# Patient Record
Sex: Female | Born: 1985 | Race: Black or African American | Hispanic: No | Marital: Single | Smoking: Current every day smoker
Health system: Southern US, Community
[De-identification: ages and names within clinical notes are randomized; demographics above are authoritative.]

## PROBLEM LIST (undated history)

## (undated) ENCOUNTER — Inpatient Hospital Stay (HOSPITAL_COMMUNITY): Payer: Self-pay

## (undated) DIAGNOSIS — F32A Depression, unspecified: Secondary | ICD-10-CM

## (undated) DIAGNOSIS — Z87728 Personal history of other specified (corrected) congenital malformations of nervous system and sense organs: Secondary | ICD-10-CM

## (undated) DIAGNOSIS — B999 Unspecified infectious disease: Secondary | ICD-10-CM

## (undated) DIAGNOSIS — F319 Bipolar disorder, unspecified: Secondary | ICD-10-CM

## (undated) DIAGNOSIS — G2401 Drug induced subacute dyskinesia: Secondary | ICD-10-CM

## (undated) DIAGNOSIS — F419 Anxiety disorder, unspecified: Secondary | ICD-10-CM

## (undated) DIAGNOSIS — F329 Major depressive disorder, single episode, unspecified: Secondary | ICD-10-CM

## (undated) DIAGNOSIS — F1490 Cocaine use, unspecified, uncomplicated: Secondary | ICD-10-CM

## (undated) DIAGNOSIS — F209 Schizophrenia, unspecified: Secondary | ICD-10-CM

## (undated) DIAGNOSIS — H6692 Otitis media, unspecified, left ear: Secondary | ICD-10-CM

## (undated) DIAGNOSIS — Z720 Tobacco use: Secondary | ICD-10-CM

## (undated) DIAGNOSIS — F1410 Cocaine abuse, uncomplicated: Secondary | ICD-10-CM

## (undated) DIAGNOSIS — G219 Secondary parkinsonism, unspecified: Secondary | ICD-10-CM

## (undated) DIAGNOSIS — Z21 Asymptomatic human immunodeficiency virus [HIV] infection status: Secondary | ICD-10-CM

## (undated) HISTORY — DX: Secondary parkinsonism, unspecified: G21.9

## (undated) HISTORY — DX: Cocaine abuse, uncomplicated: F14.10

## (undated) HISTORY — DX: Tobacco use: Z72.0

## (undated) HISTORY — PX: DENTAL RESTORATION/EXTRACTION WITH X-RAY: SHX5796

## (undated) HISTORY — DX: Personal history of other specified (corrected) congenital malformations of nervous system and sense organs: Z87.728

## (undated) HISTORY — DX: Drug induced subacute dyskinesia: G24.01

---
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
## 2004-10-21 ENCOUNTER — Emergency Department (HOSPITAL_COMMUNITY): Admission: EM | Admit: 2004-10-21 | Discharge: 2004-10-21 | Payer: Self-pay | Admitting: Emergency Medicine

## 2004-11-08 ENCOUNTER — Emergency Department (HOSPITAL_COMMUNITY): Admission: EM | Admit: 2004-11-08 | Discharge: 2004-11-08 | Payer: Self-pay | Admitting: Emergency Medicine

## 2004-12-21 ENCOUNTER — Encounter: Payer: Self-pay | Admitting: Obstetrics & Gynecology

## 2004-12-21 ENCOUNTER — Emergency Department (HOSPITAL_COMMUNITY): Admission: EM | Admit: 2004-12-21 | Discharge: 2004-12-22 | Payer: Self-pay | Admitting: Emergency Medicine

## 2004-12-22 ENCOUNTER — Encounter (INDEPENDENT_AMBULATORY_CARE_PROVIDER_SITE_OTHER): Payer: Self-pay | Admitting: *Deleted

## 2005-02-12 ENCOUNTER — Emergency Department (HOSPITAL_COMMUNITY): Admission: EM | Admit: 2005-02-12 | Discharge: 2005-02-13 | Payer: Self-pay | Admitting: Emergency Medicine

## 2007-04-04 ENCOUNTER — Emergency Department (HOSPITAL_COMMUNITY): Admission: EM | Admit: 2007-04-04 | Discharge: 2007-04-04 | Payer: Self-pay | Admitting: Emergency Medicine

## 2007-04-06 ENCOUNTER — Emergency Department (HOSPITAL_COMMUNITY): Admission: EM | Admit: 2007-04-06 | Discharge: 2007-04-06 | Payer: Self-pay | Admitting: Emergency Medicine

## 2007-08-25 HISTORY — PX: COLPOSCOPY: SHX161

## 2007-09-03 ENCOUNTER — Emergency Department (HOSPITAL_COMMUNITY): Admission: EM | Admit: 2007-09-03 | Discharge: 2007-09-03 | Payer: Self-pay | Admitting: Emergency Medicine

## 2007-10-23 ENCOUNTER — Emergency Department (HOSPITAL_COMMUNITY): Admission: EM | Admit: 2007-10-23 | Discharge: 2007-10-23 | Payer: Self-pay | Admitting: Emergency Medicine

## 2007-10-25 ENCOUNTER — Ambulatory Visit (HOSPITAL_COMMUNITY): Admission: RE | Admit: 2007-10-25 | Discharge: 2007-10-25 | Payer: Self-pay | Admitting: Family Medicine

## 2007-11-09 ENCOUNTER — Ambulatory Visit (HOSPITAL_COMMUNITY): Admission: RE | Admit: 2007-11-09 | Discharge: 2007-11-09 | Payer: Self-pay | Admitting: Family Medicine

## 2007-11-24 ENCOUNTER — Ambulatory Visit (HOSPITAL_COMMUNITY): Admission: RE | Admit: 2007-11-24 | Discharge: 2007-11-24 | Payer: Self-pay | Admitting: Family Medicine

## 2007-12-08 ENCOUNTER — Ambulatory Visit (HOSPITAL_COMMUNITY): Admission: RE | Admit: 2007-12-08 | Discharge: 2007-12-08 | Payer: Self-pay | Admitting: Family Medicine

## 2007-12-09 ENCOUNTER — Ambulatory Visit: Payer: Self-pay | Admitting: Obstetrics & Gynecology

## 2008-01-05 ENCOUNTER — Ambulatory Visit (HOSPITAL_COMMUNITY): Admission: RE | Admit: 2008-01-05 | Discharge: 2008-01-05 | Payer: Self-pay | Admitting: Family Medicine

## 2008-01-08 ENCOUNTER — Inpatient Hospital Stay (HOSPITAL_COMMUNITY): Admission: AD | Admit: 2008-01-08 | Discharge: 2008-01-08 | Payer: Self-pay | Admitting: Obstetrics and Gynecology

## 2008-01-08 ENCOUNTER — Ambulatory Visit: Payer: Self-pay | Admitting: Obstetrics and Gynecology

## 2008-03-17 ENCOUNTER — Inpatient Hospital Stay (HOSPITAL_COMMUNITY): Admission: AD | Admit: 2008-03-17 | Discharge: 2008-03-17 | Payer: Self-pay | Admitting: Family Medicine

## 2008-03-17 ENCOUNTER — Ambulatory Visit: Payer: Self-pay | Admitting: Advanced Practice Midwife

## 2008-03-27 ENCOUNTER — Ambulatory Visit (HOSPITAL_COMMUNITY): Admission: RE | Admit: 2008-03-27 | Discharge: 2008-03-27 | Payer: Self-pay | Admitting: Obstetrics & Gynecology

## 2008-03-31 ENCOUNTER — Ambulatory Visit: Payer: Self-pay | Admitting: Obstetrics & Gynecology

## 2008-03-31 ENCOUNTER — Inpatient Hospital Stay (HOSPITAL_COMMUNITY): Admission: AD | Admit: 2008-03-31 | Discharge: 2008-03-31 | Payer: Self-pay | Admitting: Obstetrics & Gynecology

## 2008-04-03 ENCOUNTER — Ambulatory Visit (HOSPITAL_COMMUNITY): Admission: RE | Admit: 2008-04-03 | Discharge: 2008-04-03 | Payer: Self-pay | Admitting: Family Medicine

## 2008-04-09 ENCOUNTER — Inpatient Hospital Stay (HOSPITAL_COMMUNITY): Admission: AD | Admit: 2008-04-09 | Discharge: 2008-04-12 | Payer: Self-pay | Admitting: Obstetrics & Gynecology

## 2008-04-09 ENCOUNTER — Ambulatory Visit: Payer: Self-pay | Admitting: Obstetrics and Gynecology

## 2008-07-26 DIAGNOSIS — Z8742 Personal history of other diseases of the female genital tract: Secondary | ICD-10-CM

## 2008-09-26 ENCOUNTER — Ambulatory Visit: Payer: Self-pay | Admitting: Obstetrics and Gynecology

## 2008-10-11 ENCOUNTER — Other Ambulatory Visit: Admission: RE | Admit: 2008-10-11 | Discharge: 2008-10-11 | Payer: Self-pay | Admitting: Obstetrics and Gynecology

## 2008-10-11 ENCOUNTER — Ambulatory Visit: Payer: Self-pay | Admitting: Obstetrics & Gynecology

## 2008-10-17 ENCOUNTER — Ambulatory Visit: Payer: Self-pay | Admitting: Obstetrics and Gynecology

## 2009-09-24 ENCOUNTER — Emergency Department (HOSPITAL_COMMUNITY): Admission: EM | Admit: 2009-09-24 | Discharge: 2009-09-24 | Payer: Self-pay | Admitting: Emergency Medicine

## 2009-10-16 ENCOUNTER — Emergency Department (HOSPITAL_COMMUNITY): Admission: EM | Admit: 2009-10-16 | Discharge: 2009-10-16 | Payer: Self-pay | Admitting: Emergency Medicine

## 2009-12-02 ENCOUNTER — Emergency Department (HOSPITAL_COMMUNITY): Admission: EM | Admit: 2009-12-02 | Discharge: 2009-12-02 | Payer: Self-pay | Admitting: Emergency Medicine

## 2009-12-16 ENCOUNTER — Inpatient Hospital Stay (HOSPITAL_COMMUNITY): Admission: AD | Admit: 2009-12-16 | Discharge: 2009-12-16 | Payer: Self-pay | Admitting: Obstetrics & Gynecology

## 2010-01-11 IMAGING — US US OB NUCHAL TRANSLUCENCY 1ST GEST
1 series · 14 of 28 positions shown · non-contrast
Comparison: none

OBSTETRICAL ULTRASOUND:
 This ultrasound was performed in The [HOSPITAL], and the [facility] report will be stored to [REDACTED] PACS.

[Series 1: us ob nuchal translucency 1st gest · 14 of 28 slices shown]
[im 2/28]
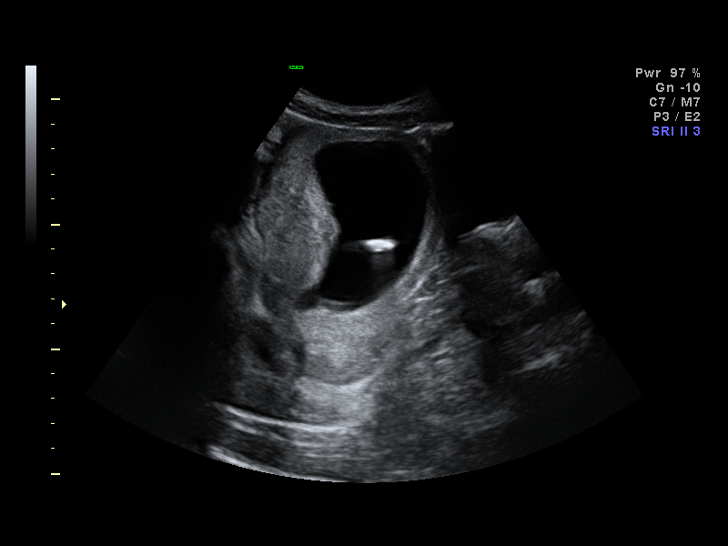
[im 4/28]
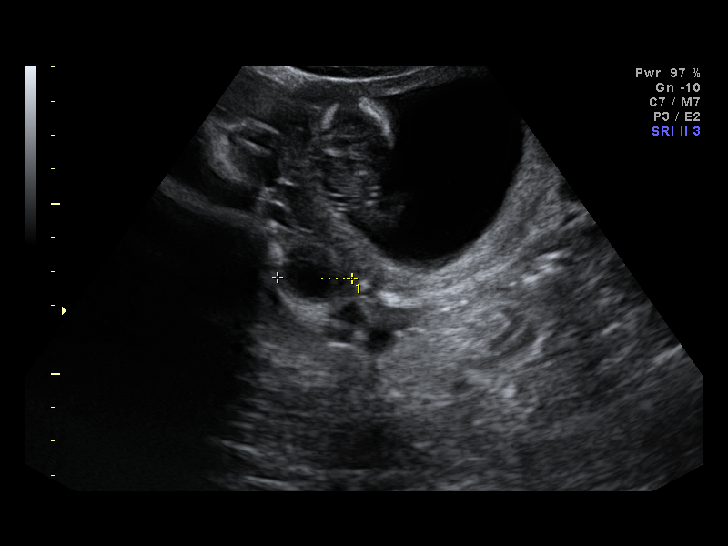
[im 6/28]
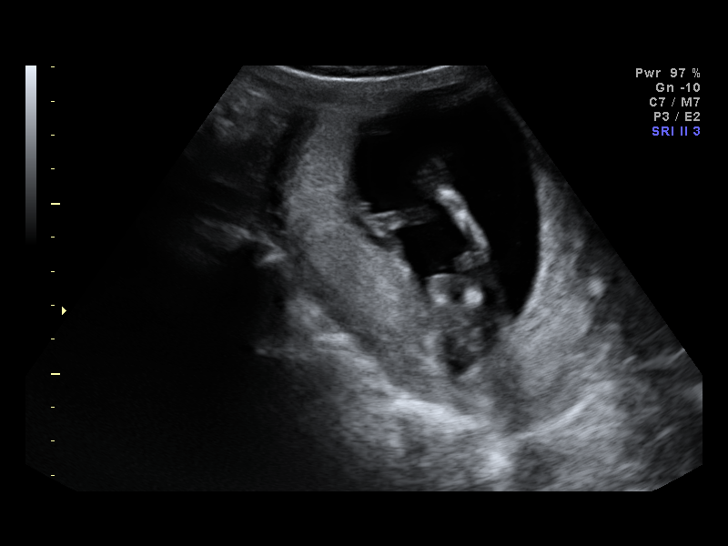
[im 8/28]
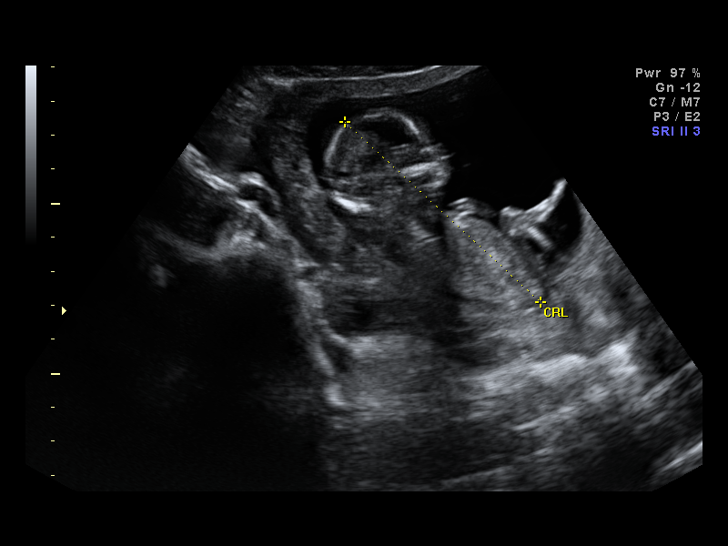
[im 10/28]
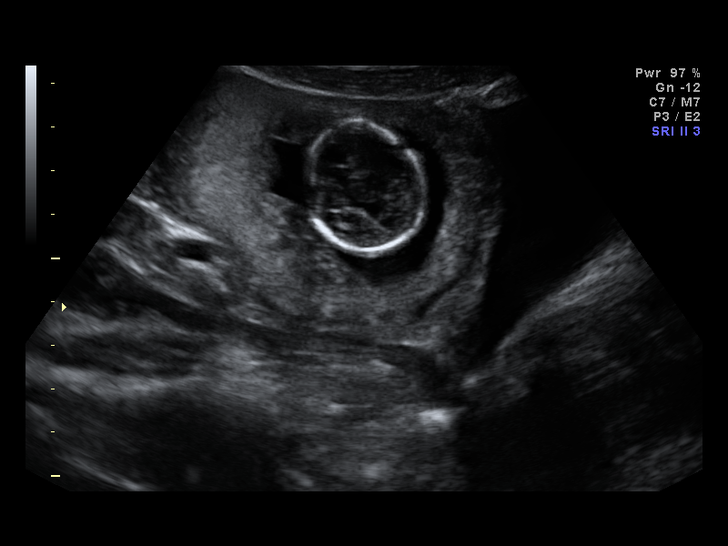
[im 12/28]
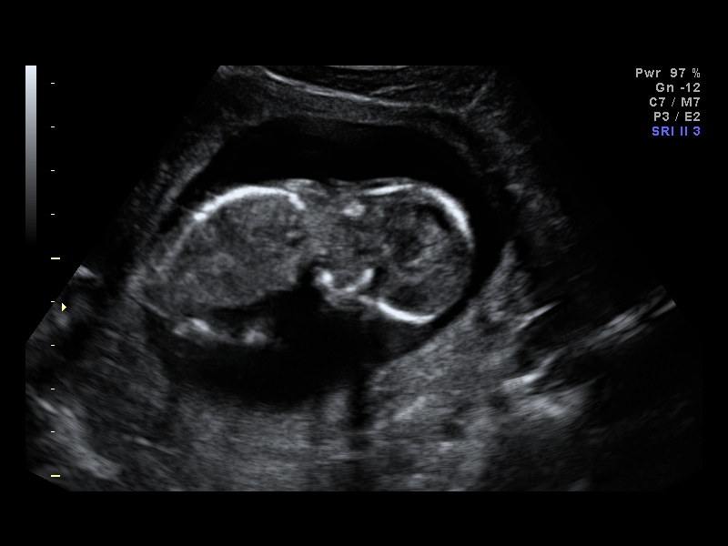
[im 14/28]
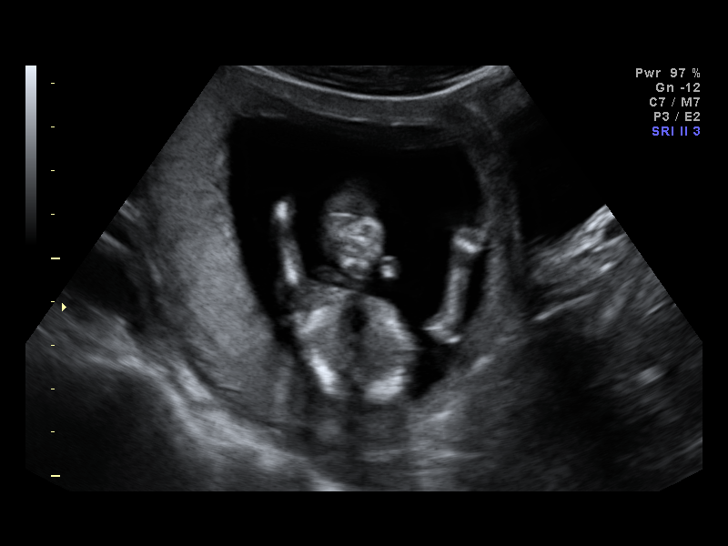
[im 16/28]
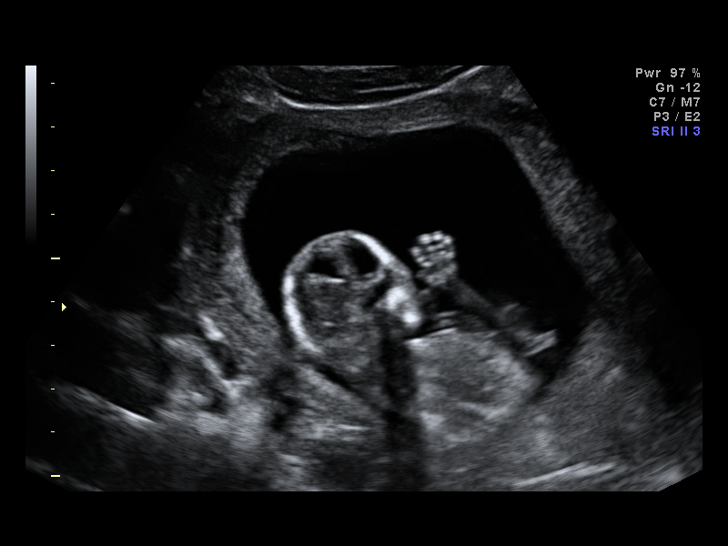
[im 18/28]
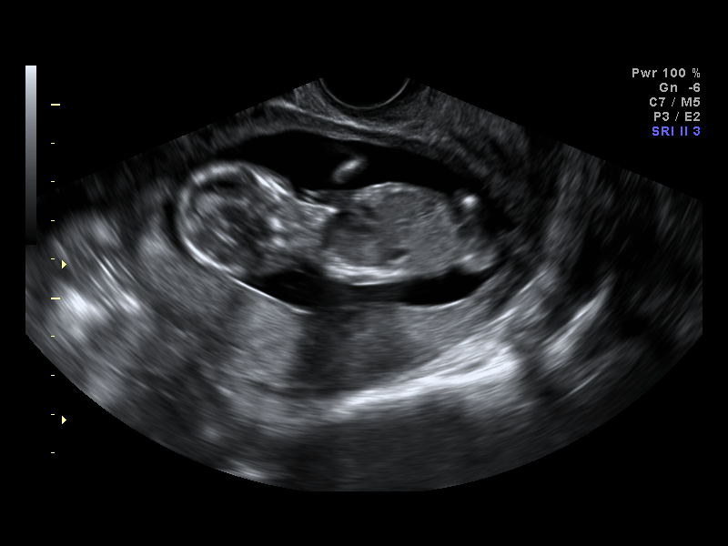
[im 20/28]
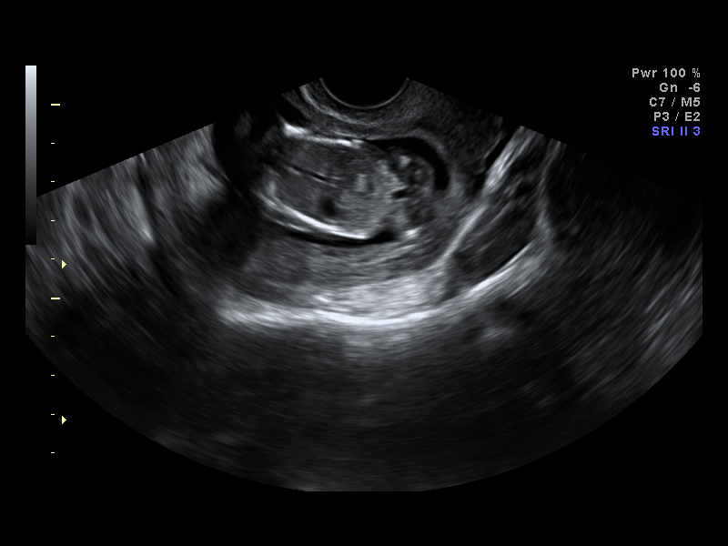
[im 22/28]
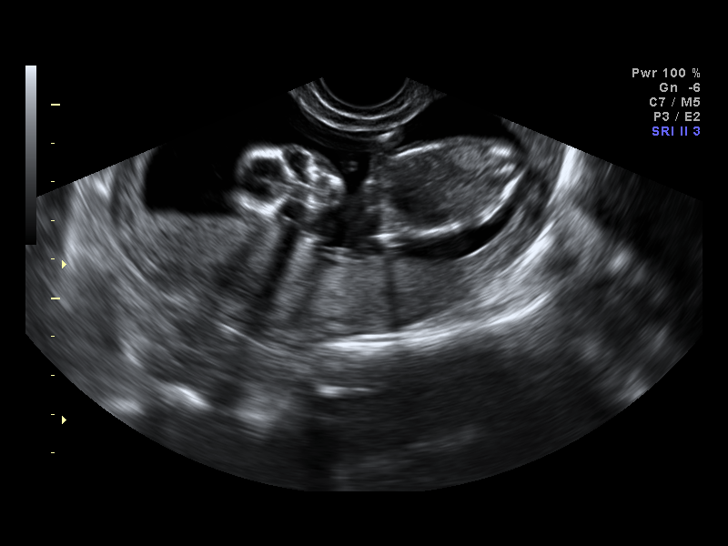
[im 24/28]
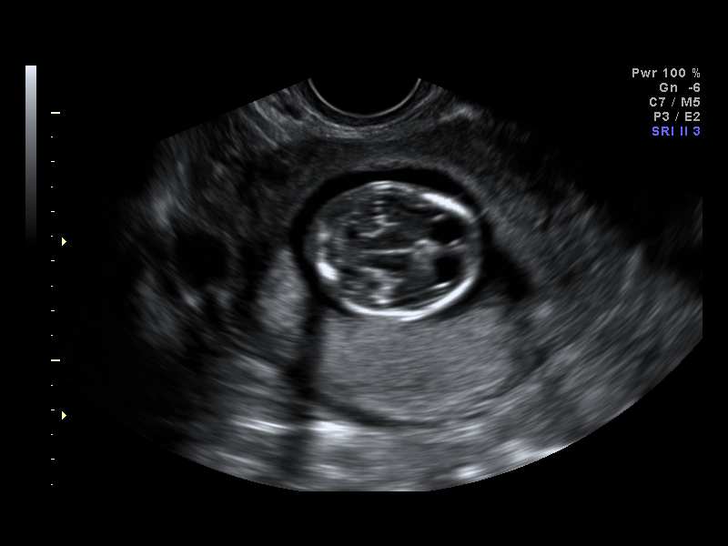
[im 26/28]
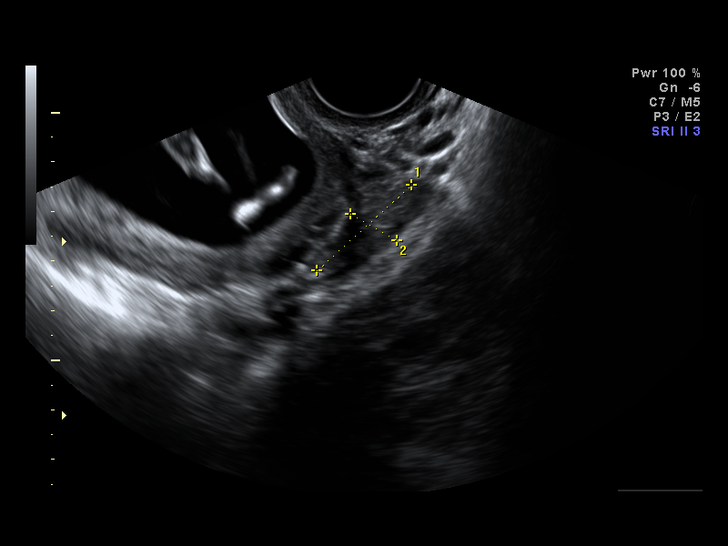
[im 28/28]
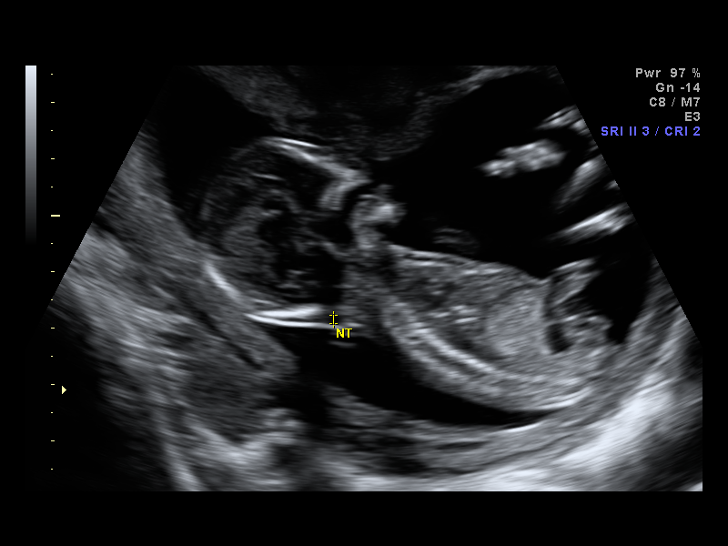

[14 of 28 positions shown; findings below may reference images not displayed]

IMPRESSION: The [facility] report has also been faxed to the ordering physician.

## 2010-08-31 ENCOUNTER — Emergency Department (HOSPITAL_COMMUNITY)
Admission: EM | Admit: 2010-08-31 | Discharge: 2010-08-31 | Payer: Self-pay | Source: Home / Self Care | Admitting: Emergency Medicine

## 2010-09-08 LAB — POCT PREGNANCY, URINE: Preg Test, Ur: POSITIVE

## 2010-11-11 LAB — RAPID URINE DRUG SCREEN, HOSP PERFORMED
Benzodiazepines: NOT DETECTED
Cocaine: NOT DETECTED
Opiates: NOT DETECTED

## 2010-11-11 LAB — CBC
HCT: 35.2 % — ABNORMAL LOW (ref 36.0–46.0)
MCV: 86.2 fL (ref 78.0–100.0)
Platelets: 115 10*3/uL — ABNORMAL LOW (ref 150–400)
RDW: 14.5 % (ref 11.5–15.5)
WBC: 9.4 10*3/uL (ref 4.0–10.5)

## 2010-11-11 LAB — HCG, QUANTITATIVE, PREGNANCY: hCG, Beta Chain, Quant, S: 1201 m[IU]/mL — ABNORMAL HIGH (ref <5)

## 2010-11-12 LAB — POCT PREGNANCY, URINE: Preg Test, Ur: POSITIVE

## 2010-11-12 LAB — URINALYSIS, ROUTINE W REFLEX MICROSCOPIC
Bilirubin Urine: NEGATIVE
Glucose, UA: NEGATIVE mg/dL
Hgb urine dipstick: NEGATIVE
Ketones, ur: 15 mg/dL — AB
Protein, ur: 30 mg/dL — AB
Urobilinogen, UA: 1 mg/dL (ref 0.0–1.0)

## 2010-11-12 LAB — URINE MICROSCOPIC-ADD ON

## 2010-12-09 LAB — POCT PREGNANCY, URINE: Preg Test, Ur: NEGATIVE

## 2011-01-06 NOTE — Group Therapy Note (Signed)
NAMELAURYL, SEYER                ACCOUNT NO.:  [id number]   MEDICAL RECORD NO.:  [id number]          PATIENT TYPE:  WOC   LOCATION:  [facility]                   FACILITY:  [facility]   PHYSICIAN:  Odie Sera, D.O.    DATE OF BIRTH:  [birth year]   DATE OF SERVICE:                                  CLINIC NOTE   INDICATION FOR SURGERY:  Ms. Easom is a 25 year old, gravida 2, para 1-0-  1-1, who previously had a colposcopy on July 12, 2008.  Biopsy  showing CIN 1 and 2, and CIN 3.  She presents today for LEEP.  She has  previously watched the LEEP video and we discussed the risks and  benefits of the procedure to include, but not limited to bleeding,  infection, and the need for a repeat LEEP colposcopy, or other  procedure.   DESCRIPTION OF PROCEDURE:  Under speculum exam, a colposcopy was  performed.  Her transformation zone was seeing 306 degrees, some  acetowhite epithelium was noted on the inferior aspect at approximately  4-7 o'clock locations.  The cervix was then anesthetized with  intracervical block, using 10 mL of 1% lidocaine without epinephrine.  A  biopsy was then performed using the Fischer loop and an adequate biopsy  was obtained and an electrocautery was used to cauterize the edges of  the biopsy area as well as some areas of oozing.  Some Monsel solution  was applied.  The patient tolerated the procedure well.  EBL is none.  The patient will return in 2 weeks for her results of the biopsy.           ______________________________  Odie Sera, D.O.     MC/MEDQ  D:  10/11/2008  T:  10/12/2008  Job:  [id number]

## 2011-01-06 NOTE — Group Therapy Note (Signed)
Tamara Briggs, MARCELLA NO.:  [id number]   MEDICAL RECORD NO.:  [id number]          PATIENT TYPE:  WOC   LOCATION:  [facility]                   FACILITY:  [facility]   PHYSICIAN:  Scheryl Darter, MD       DATE OF BIRTH:  August 04, 1986   DATE OF SERVICE:  09/26/2008                                  CLINIC NOTE   Patient is a 25 year old black female, gravida 2, para 1, abortus 1.  The last menstrual period is current with some spotting on Depo-Provera.  Patient had a Pap smear with LSIL on May 22, 2008 at [facility].  There were a few cells suggestive of higher-  grade lesions.  Colposcopy was done July 12, 2008, had biopsies  showing CIN-1 to 3.  She is here for evaluation for LEEP.  I explained  the nature of the abnormalities and the risk of progression to cervical  cancer.  Discussed cryotherapy and LEEP procedure and conization.  Offered scheduling LEEP.  Explained the procedure and the risk of  cervical scarring, cervical incompetence, bleeding, infection or  recurrent dysplasia.   PHYSICAL EXAMINATION:  Patient in no acute distress, is pleasant.  PELVIC EXAM:  External genitalia appeared normal.  Vagina and cervix  revealed  slight brownish spotting.  Cervix is parous with no gross  lesions.  Uterus is normal size, no adnexal masses or tenderness.   IMPRESSION:  Severe cervical dysplasia.   PLAN:  We will schedule a LEEP.      Scheryl Darter, MD     JA/MEDQ  D:  09/26/2008  T:  09/26/2008  Job:  [id number]

## 2011-05-13 LAB — DIFFERENTIAL
Basophils Absolute: 0
Basophils Relative: 0
Eosinophils Absolute: 0.1
Eosinophils Relative: 1
Lymphocytes Relative: 17
Lymphs Abs: 1.8
Monocytes Absolute: 0.8
Monocytes Relative: 7
Neutro Abs: 7.7
Neutrophils Relative %: 74

## 2011-05-13 LAB — HCG, QUANTITATIVE, PREGNANCY: hCG, Beta Chain, Quant, S: 41326 — ABNORMAL HIGH

## 2011-05-13 LAB — CBC
HCT: 38.7
Hemoglobin: 12.8
MCHC: 33.1
MCV: 86.2
Platelets: 216
RBC: 4.48
RDW: 13.5
WBC: 10.3

## 2011-05-13 LAB — RPR: RPR Ser Ql: NONREACTIVE

## 2011-05-13 LAB — URINALYSIS, ROUTINE W REFLEX MICROSCOPIC
Bilirubin Urine: NEGATIVE
Glucose, UA: NEGATIVE
Hgb urine dipstick: NEGATIVE
Ketones, ur: 15 — AB
Nitrite: NEGATIVE
Protein, ur: NEGATIVE
Specific Gravity, Urine: 1.03
Urobilinogen, UA: 1
pH: 6.5

## 2011-05-13 LAB — POCT PREGNANCY, URINE
Operator id: 257131
Preg Test, Ur: POSITIVE

## 2011-05-18 LAB — COMPREHENSIVE METABOLIC PANEL
AST: 23
Albumin: 3.1 — ABNORMAL LOW
Alkaline Phosphatase: 51
Chloride: 105
Creatinine, Ser: 0.54
GFR calc Af Amer: >60
Potassium: 3.8
Total Bilirubin: 0.5

## 2011-05-18 LAB — DIFFERENTIAL
Basophils Absolute: 0
Eosinophils Relative: 1
Lymphocytes Relative: 24
Monocytes Absolute: 0.5

## 2011-05-18 LAB — URINE CULTURE

## 2011-05-18 LAB — WET PREP, GENITAL
Clue Cells Wet Prep HPF POC: NONE SEEN
Trich, Wet Prep: NONE SEEN
WBC, Wet Prep HPF POC: NONE SEEN
Yeast Wet Prep HPF POC: NONE SEEN

## 2011-05-18 LAB — URINALYSIS, ROUTINE W REFLEX MICROSCOPIC
Bilirubin Urine: NEGATIVE
Glucose, UA: NEGATIVE
Ketones, ur: NEGATIVE
pH: 8

## 2011-05-18 LAB — URINE MICROSCOPIC-ADD ON

## 2011-05-18 LAB — CBC
Platelets: 168
WBC: 6.9

## 2011-05-19 LAB — POCT PREGNANCY, URINE: Preg Test, Ur: POSITIVE

## 2011-05-20 LAB — URINALYSIS, ROUTINE W REFLEX MICROSCOPIC
Bilirubin Urine: NEGATIVE
Glucose, UA: NEGATIVE
Hgb urine dipstick: NEGATIVE
Specific Gravity, Urine: <1.005 — ABNORMAL LOW
Urobilinogen, UA: 0.2
pH: 7

## 2011-05-22 LAB — URINALYSIS, ROUTINE W REFLEX MICROSCOPIC
Glucose, UA: 100 — AB
Leukocytes, UA: NEGATIVE
Protein, ur: NEGATIVE
Specific Gravity, Urine: <1.005 — ABNORMAL LOW
pH: 7

## 2011-05-22 LAB — URINE MICROSCOPIC-ADD ON

## 2011-06-08 LAB — WET PREP, GENITAL: Yeast Wet Prep HPF POC: NONE SEEN

## 2011-06-08 LAB — GC/CHLAMYDIA PROBE AMP, GENITAL: Chlamydia, DNA Probe: NEGATIVE

## 2011-06-08 LAB — URINALYSIS, ROUTINE W REFLEX MICROSCOPIC
Hgb urine dipstick: NEGATIVE
Nitrite: NEGATIVE
Protein, ur: NEGATIVE
Specific Gravity, Urine: 1.012
Urobilinogen, UA: 0.2

## 2011-06-08 LAB — POCT PREGNANCY, URINE: Preg Test, Ur: NEGATIVE

## 2011-06-29 ENCOUNTER — Encounter: Payer: Self-pay | Admitting: Internal Medicine

## 2011-07-04 ENCOUNTER — Emergency Department (HOSPITAL_COMMUNITY)
Admission: EM | Admit: 2011-07-04 | Discharge: 2011-07-05 | Disposition: A | Discharge status: Home / Self Care | Payer: Self-pay | Attending: Emergency Medicine | Admitting: Emergency Medicine

## 2011-07-04 ENCOUNTER — Encounter: Payer: Self-pay | Admitting: *Deleted

## 2011-07-04 ENCOUNTER — Emergency Department (HOSPITAL_COMMUNITY): Payer: Self-pay

## 2011-07-04 DIAGNOSIS — S93409A Sprain of unspecified ligament of unspecified ankle, initial encounter: Secondary | ICD-10-CM | POA: Insufficient documentation

## 2011-07-04 DIAGNOSIS — W108XXA Fall (on) (from) other stairs and steps, initial encounter: Secondary | ICD-10-CM | POA: Insufficient documentation

## 2011-07-04 DIAGNOSIS — F17200 Nicotine dependence, unspecified, uncomplicated: Secondary | ICD-10-CM | POA: Insufficient documentation

## 2011-07-04 DIAGNOSIS — M25579 Pain in unspecified ankle and joints of unspecified foot: Secondary | ICD-10-CM | POA: Insufficient documentation

## 2011-07-04 MED ORDER — HYDROCODONE-ACETAMINOPHEN 5-325 MG PO TABS
1.0000 | ORAL_TABLET | Freq: Once | ORAL | Status: DC
  Filled 2011-07-04: qty 1

## 2011-07-04 MED ORDER — IBUPROFEN 200 MG PO TABS
400.0000 mg | ORAL_TABLET | Freq: Once | ORAL | Status: AC
  Administered 2011-07-04: 400 mg via ORAL

## 2011-07-04 MED ORDER — HYDROCODONE-ACETAMINOPHEN 7.5-325 MG/15ML PO SOLN: 15.0000 mL | ORAL | Status: AC | PRN

## 2011-07-04 MED ORDER — IBUPROFEN 200 MG PO TABS
400.0000 mg | ORAL_TABLET | Freq: Once | ORAL | Status: DC
  Filled 2011-07-04: qty 1

## 2011-07-04 MED ORDER — HYDROCODONE-ACETAMINOPHEN 5-325 MG PO TABS
1.0000 | ORAL_TABLET | Freq: Once | ORAL | Status: AC
  Administered 2011-07-04: 1 via ORAL

## 2011-07-04 NOTE — ED Provider Notes (Signed)
History     CSN: [id number] Arrival date & time: 07/04/2011  6:19 PM   First MD Initiated Contact with Patient 07/04/11 2125      Chief Complaint  Patient presents with  . Ankle Pain    left ankle post fall    HPI: Patient is a 25 y.o. female presenting with fall. The history is provided by the patient. No language interpreter was used.  Fall The accident occurred 3 to 5 hours ago. Incident: tripped going down stairs. She fell from a height of 1 to 2 ft. She landed on concrete. Pain location: left ankle. The pain is at a severity of 8/10. The pain is severe. She was ambulatory at the scene. There was no entrapment after the fall. There was no drug use involved in the accident. There was no alcohol use involved in the accident. The symptoms are aggravated by ambulation. She has tried nothing for the symptoms.  Reports fell going down stairs today at approx 1700. Now w/ pain to (L) ankle.  History reviewed. No pertinent past medical history.  History reviewed. No pertinent past surgical history.  No family history on file.  History  Substance Use Topics  . Smoking status: Current Everyday Smoker  . Smokeless tobacco: Not on file  . Alcohol Use: No    OB History    Grav Para Term Preterm Abortions TAB SAB Ect Mult Living                  Review of Systems  Constitutional: Negative.   HENT: Negative.   Eyes: Negative.   Respiratory: Negative.   Gastrointestinal: Negative.   Genitourinary: Negative.   Musculoskeletal: Negative.   Skin: Negative.   Neurological: Negative.   Hematological: Negative.   Psychiatric/Behavioral: Negative.     Allergies  Review of patient's allergies indicates no known allergies.  Home Medications  No current outpatient prescriptions on file.  BP 97/63  Pulse 70  Temp(Src) 98 F (36.7 C) (Oral)  Resp 18  SpO2 100%  LMP 06/14/2011  Physical Exam  Constitutional: She is oriented to person, place, and time. She appears  well-developed and well-nourished.  HENT:  Head: Normocephalic and atraumatic.  Eyes: Conjunctivae are normal.  Neck: Normal range of motion.  Cardiovascular: Normal rate.   Pulmonary/Chest: Effort normal.  Musculoskeletal:       Right shoulder: Normal.       Left ankle: She exhibits no swelling, no ecchymosis, no deformity, no laceration and normal pulse. tenderness.  Neurological: She is alert and oriented to person, place, and time.  Skin: Skin is warm and dry.  Psychiatric: She has a normal mood and affect.    ED Course  Procedures No obvious signs of tx. Pt states minimal relief w/ percocet. Will plan for d/c home w/ short course of med for pain, ace wrap and post-op shoe and encourage f/u w/ ortho if not improving over next few days.   Labs Reviewed - No data to display Dg Ankle Complete Left  07/04/2011  *RADIOLOGY REPORT*  Clinical Data: Injured ankle.  LEFT ANKLE COMPLETE - 3+ VIEW  Comparison: 09/24/2009.  Findings: The ankle mortise is maintained.  No acute ankle fracture.  No osteochondral lesion.  The mid and hind foot bony structures are intact.  IMPRESSION: No acute bony findings.  Original Report Authenticated By: P. Loralie Champagne, M.D.     No diagnosis found.    MDM        DG Ankle Complete  Left (Final result)   Result time:07/04/11 2021    Final result by Rad Results In Interface (07/04/11 20:21:49)    Narrative:   *RADIOLOGY REPORT*  Clinical Data: Injured ankle.  LEFT ANKLE COMPLETE - 3+ VIEW  Comparison: 09/24/2009.  Findings: The ankle mortise is maintained. No acute ankle fracture. No osteochondral lesion. The mid and hind foot bony structures are intact.  IMPRESSION: No acute bony findings.  Original Report Authenticated By: P. Loralie Champagne, M.D          Leanne Chang, NP 07/04/11 2345  Roma Kayser Windsor Zirkelbach, NP 07/05/11 [phone]

## 2011-07-04 NOTE — ED Notes (Signed)
Ace wrap applied to left foot/ankle. Pt tolerates well.

## 2011-07-04 NOTE — ED Notes (Signed)
Pt fell off a porch about 4 steps and she missed step and now with left ankle pain

## 2011-07-05 NOTE — ED Provider Notes (Signed)
Medical screening examination/treatment/procedure(s) were performed by non-physician practitioner and as supervising physician I was immediately available for consultation/collaboration.   Vida Roller, MD 07/05/11 [phone]

## 2011-07-09 ENCOUNTER — Ambulatory Visit (INDEPENDENT_AMBULATORY_CARE_PROVIDER_SITE_OTHER): Payer: Self-pay

## 2011-07-09 DIAGNOSIS — Z111 Encounter for screening for respiratory tuberculosis: Secondary | ICD-10-CM

## 2011-07-09 DIAGNOSIS — Z7721 Contact with and (suspected) exposure to potentially hazardous body fluids: Secondary | ICD-10-CM

## 2011-07-09 DIAGNOSIS — Z23 Encounter for immunization: Secondary | ICD-10-CM

## 2011-07-09 DIAGNOSIS — B20 Human immunodeficiency virus [HIV] disease: Secondary | ICD-10-CM

## 2011-07-09 NOTE — Progress Notes (Signed)
BC enrolled this patient into the bridge counseling program on 06/29/11. BC will work with patient to engage her into medical care. BC will also provide treatment adherence if and when patient is put on medications. Patient admits to active crack cocaine use and has requested an inpatient tx facility to help her get clean. BC will assist with these goals.

## 2011-07-09 NOTE — Progress Notes (Signed)
Pt denied drug use but I was advised by Sharol Roussel she has a current history of crack use. She was very short with answers and somewhat shy and awkward at times.

## 2011-07-09 NOTE — Progress Notes (Deleted)
  Subjective:    Patient ID: Tamara Briggs, female    DOB: Jul 26, 1986, 25 y.o.   MRN: [id number]  HPI    Review of Systems     Objective:   Physical Exam        Assessment & Plan:

## 2011-07-10 LAB — CBC WITH DIFFERENTIAL/PLATELET
Basophils Absolute: 0 10*3/uL (ref 0.0–0.1)
Basophils Relative: 0 % (ref 0–1)
Hemoglobin: 11.9 g/dL — ABNORMAL LOW (ref 12.0–15.0)
Lymphocytes Relative: 26 % (ref 12–46)
MCHC: 30.8 g/dL (ref 30.0–36.0)
Neutro Abs: 1.5 10*3/uL — ABNORMAL LOW (ref 1.7–7.7)
Neutrophils Relative %: 55 % (ref 43–77)
RDW: 13.8 % (ref 11.5–15.5)
WBC: 2.7 10*3/uL — ABNORMAL LOW (ref 4.0–10.5)

## 2011-07-10 LAB — COMPLETE METABOLIC PANEL WITH GFR
ALT: 30 U/L (ref 0–35)
AST: 35 U/L (ref 0–37)
Albumin: 4.3 g/dL (ref 3.5–5.2)
Alkaline Phosphatase: 59 U/L (ref 39–117)
Potassium: 3.7 mEq/L (ref 3.5–5.3)
Sodium: 139 mEq/L (ref 135–145)
Total Protein: 8.3 g/dL (ref 6.0–8.3)

## 2011-07-10 LAB — LIPID PANEL
Cholesterol: 128 mg/dL (ref 0–200)
VLDL: 6 mg/dL (ref 0–40)

## 2011-07-10 LAB — RPR

## 2011-07-10 LAB — HEPATITIS C ANTIBODY: HCV Ab: NEGATIVE

## 2011-07-10 LAB — URINALYSIS
Bilirubin Urine: NEGATIVE
Protein, ur: NEGATIVE mg/dL
Urobilinogen, UA: 1 mg/dL (ref 0.0–1.0)

## 2011-07-10 LAB — HEPATITIS A ANTIBODY, TOTAL: Hep A Total Ab: NEGATIVE

## 2011-07-10 LAB — T-HELPER CELL (CD4) - (RCID CLINIC ONLY): CD4 T Cell Abs: 290 uL — ABNORMAL LOW (ref 400–2700)

## 2011-07-10 LAB — HEPATITIS B CORE ANTIBODY, TOTAL: Hep B Core Total Ab: NEGATIVE

## 2011-07-10 LAB — HIV ANTIBODY (ROUTINE TESTING W REFLEX): HIV: REACTIVE

## 2011-07-13 LAB — HIV-1 RNA ULTRAQUANT REFLEX TO GENTYP+
HIV 1 RNA Quant: 127000 copies/mL — ABNORMAL HIGH (ref <20)
HIV-1 RNA Quant, Log: 5.1 {Log} — ABNORMAL HIGH (ref <1.30)

## 2011-07-16 ENCOUNTER — Inpatient Hospital Stay (HOSPITAL_COMMUNITY)
Admission: AD | Admit: 2011-07-16 | Discharge: 2011-07-17 | Disposition: A | Discharge status: Home / Self Care | Payer: Self-pay | Source: Ambulatory Visit | Attending: Obstetrics and Gynecology | Admitting: Obstetrics and Gynecology

## 2011-07-16 ENCOUNTER — Encounter (HOSPITAL_COMMUNITY): Payer: Self-pay

## 2011-07-16 DIAGNOSIS — Z3202 Encounter for pregnancy test, result negative: Secondary | ICD-10-CM

## 2011-07-16 DIAGNOSIS — A7489 Other chlamydial diseases: Secondary | ICD-10-CM

## 2011-07-16 DIAGNOSIS — R112 Nausea with vomiting, unspecified: Secondary | ICD-10-CM | POA: Insufficient documentation

## 2011-07-16 DIAGNOSIS — A749 Chlamydial infection, unspecified: Secondary | ICD-10-CM

## 2011-07-16 LAB — URINALYSIS, ROUTINE W REFLEX MICROSCOPIC
Glucose, UA: NEGATIVE mg/dL
Leukocytes, UA: NEGATIVE
Protein, ur: 30 mg/dL — AB
Specific Gravity, Urine: 1.025 (ref 1.005–1.030)
Urobilinogen, UA: 0.2 mg/dL (ref 0.0–1.0)

## 2011-07-16 LAB — URINE MICROSCOPIC-ADD ON

## 2011-07-16 LAB — POCT PREGNANCY, URINE: Preg Test, Ur: NEGATIVE

## 2011-07-16 MED ORDER — AZITHROMYCIN 500 MG PO TABS: 1000.0000 mg | ORAL_TABLET | Freq: Once | ORAL | Status: AC

## 2011-07-16 MED ORDER — ONDANSETRON 8 MG PO TBDP
8.0000 mg | ORAL_TABLET | Freq: Once | ORAL | Status: AC
  Administered 2011-07-16: 8 mg via ORAL
  Filled 2011-07-16: qty 1

## 2011-07-16 NOTE — Progress Notes (Signed)
Pt states [facility] & was told her urine pregnancy test was positive. States came in today for nausea & vomiting since 2000 tonight with 2 episodes of vomiting. Denies pain. Denies vaginal bleeding. Reports brown watery vaginal discharge x2 days.

## 2011-07-16 NOTE — ED Provider Notes (Signed)
History     No chief complaint on file.  HPI Tamara Briggs 25 y.o. comes to [facility]U via EMS saying she is pregnant and is having nausea and vomiting.  EMS personnel report the police were on the scene when she was picked up from a family member's home.   OB History    Grav Para Term Preterm Abortions TAB SAB Ect Mult Living   2 1 1  0 1 0 1 0 0 1      Past Medical History  Diagnosis Date  . No pertinent past medical history     Past Surgical History  Procedure Date  . Colposcopy 2009    No family history on file.  History  Substance Use Topics  . Smoking status: Current Everyday Smoker -- 0.5 packs/day    Types: Cigarettes  . Smokeless tobacco: Not on file   Comment: smoker for 5 years   . Alcohol Use: No    Allergies: No Known Allergies  No prescriptions prior to admission    Review of Systems  Gastrointestinal: Positive for nausea and vomiting. Negative for abdominal pain.  Genitourinary:       Reports vaginal spotting   Physical Exam   Blood pressure 106/62, pulse 98, temperature 98.2 F (36.8 C), temperature source Oral, resp. rate 18, height 5\' 4"  (1.626 m), weight 94 lb (42.638 kg), last menstrual period 06/14/2011, SpO2 98.00%.  Physical Exam  Nursing note and vitals reviewed. Constitutional: She is oriented to person, place, and time. She appears well-developed and well-nourished.  HENT:  Head: Normocephalic.  Eyes: EOM are normal.  Neck: Neck supple.  Musculoskeletal: Normal range of motion.  Neurological: She is alert and oriented to person, place, and time.  Skin: Skin is warm and dry.  Psychiatric:       Minimal eye contact.  Is easily irritated.  Is angry and loud when answering questions.    MAU Course  Procedures Given Zofran 8 mg ODT sublingual for nausea  MDM Client does not offer any information.  Informed her pregnancy test is negative.  Reviewed labs done on 07-09-11.  When told her that testing on the 15th showed positive  chlamydia and positive HIV, she had no reaction. Unable to tell where she had been other than "across from [facility]".  Gave no information on the clinic, why she was there, or how she came to be there.  Denies getting any phone calls from the clinic.    States she has no way to get home.  House Coverage called.  Bus pass given.  Client was in a hurry to leave to get the bus.  Results for orders placed during the hospital encounter of 07/16/11 (from the past 24 hour(s))  URINALYSIS, ROUTINE W REFLEX MICROSCOPIC     Status: Abnormal   Collection Time   07/16/11  9:35 PM      Component Value Range   Color, Urine YELLOW  YELLOW    Appearance CLEAR  CLEAR    Specific Gravity, Urine 1.025  1.005 - 1.030    pH 6.0  5.0 - 8.0    Glucose, UA NEGATIVE  NEGATIVE (mg/dL)   Hgb urine dipstick NEGATIVE  NEGATIVE    Bilirubin Urine NEGATIVE  NEGATIVE    Ketones, ur NEGATIVE  NEGATIVE (mg/dL)   Protein, ur 30 (*) NEGATIVE (mg/dL)   Urobilinogen, UA 0.2  0.0 - 1.0 (mg/dL)   Nitrite NEGATIVE  NEGATIVE    Leukocytes, UA NEGATIVE  NEGATIVE  URINE MICROSCOPIC-ADD ON     Status: Abnormal   Collection Time   07/16/11  9:35 PM      Component Value Range   Squamous Epithelial / LPF FEW (*) RARE    WBC, UA 0-2  <3 (WBC/hpf)   RBC / HPF 0-2  <3 (RBC/hpf)   Bacteria, UA FEW (*) RARE   POCT PREGNANCY, URINE     Status: Normal   Collection Time   07/16/11  9:40 PM      Component Value Range   Preg Test, Ur NEGATIVE      Assessment and Plan  Nausea  Plan Zofran given in MAU Prescription for azithromycin given to treat chlamydia Advised to go to std clinic for treatment if unable to get prescription filled Advised to have partner go to STD clinic for treatment. Advised to keep appointment on Dec. 3 where she had been previously. Discussed with client that the spotting she is currently having is likely the beginning of her menses (LMP 06-14-11) or could be irreg bleeding associated with positive  chlamydia. Urine drug screen pending - previous notes on chart indicate she has used crack and was requesting admission to a treatment facility.  BURLESON,TERRI 07/16/2011, 10:34 PM   Nolene Bernheim, NP 07/16/11 2342  Nolene Bernheim, NP 07/16/11 1610  Nolene Bernheim, NP 07/16/11 2345

## 2011-07-17 LAB — RAPID URINE DRUG SCREEN, HOSP PERFORMED
Opiates: NOT DETECTED
Tetrahydrocannabinol: NOT DETECTED

## 2011-07-17 NOTE — ED Provider Notes (Signed)
Agree with above note.  Jamilee Lafosse 07/17/2011 6:47 AM

## 2011-07-27 ENCOUNTER — Other Ambulatory Visit: Payer: Self-pay | Admitting: *Deleted

## 2011-07-27 ENCOUNTER — Encounter: Payer: Self-pay | Admitting: Internal Medicine

## 2011-07-27 ENCOUNTER — Ambulatory Visit (INDEPENDENT_AMBULATORY_CARE_PROVIDER_SITE_OTHER): Payer: Self-pay | Admitting: Internal Medicine

## 2011-07-27 DIAGNOSIS — B20 Human immunodeficiency virus [HIV] disease: Secondary | ICD-10-CM

## 2011-07-27 DIAGNOSIS — R636 Underweight: Secondary | ICD-10-CM | POA: Insufficient documentation

## 2011-07-27 DIAGNOSIS — A749 Chlamydial infection, unspecified: Secondary | ICD-10-CM | POA: Insufficient documentation

## 2011-07-27 DIAGNOSIS — A7489 Other chlamydial diseases: Secondary | ICD-10-CM

## 2011-07-27 DIAGNOSIS — Z21 Asymptomatic human immunodeficiency virus [HIV] infection status: Secondary | ICD-10-CM

## 2011-07-27 MED ORDER — AZITHROMYCIN 500 MG PO TABS: 1000.0000 mg | ORAL_TABLET | Freq: Once | ORAL | Status: AC

## 2011-07-27 MED ORDER — EMTRICITAB-RILPIVIR-TENOFOV DF 200-25-300 MG PO TABS: 1.0000 | ORAL_TABLET | Freq: Every day | ORAL | Status: DC

## 2011-07-27 MED ORDER — ENSURE PO LIQD: 1.0000 | Freq: Two times a day (BID) | ORAL | Status: DC

## 2011-07-27 MED ORDER — AZITHROMYCIN 500 MG PO TABS: 1000.0000 mg | ORAL_TABLET | Freq: Once | ORAL | Status: DC

## 2011-07-28 LAB — HIV-1 GENOTYPR PLUS

## 2011-08-03 NOTE — Progress Notes (Signed)
INFECTIOUS DISEASES HIV INITIAL VISIT  Subjective:    Patient ID: Tamara Briggs, female    DOB: [birth year], 25 y.o.   MRN: [id number]  HPI Ms. Trawick is a 25yo Female HIV +, CD4 count of 290(33%)/VL 127,000,ART naive, initially diagnosed 2-3 yrs ago per patient. Risk factors include unprotected sex, illicit drug use, but denies injection. She reports having a main sex partner x 7 mo. In early November, 06/30/11, she was in the ED for vaginal spotting, and found to be pregnant in addition to having + chlamydia testing, UDS + cocaine. She subsequently had spontaneous abortion with follow up pregnancy test on 07/16/11 being negative. The patient does not recall being treated for chlamydia. She presents to ID clinic to re-establish care. She is interested in restarting Depo as her method of birth control.   The patient states that she has difficulty gaining weigh. On her prior pregnancy, she was up to 135 pounds in 2009. Currently her weight has been stable at 94 lbs. (she is 5 ft 4inch)  ALL: [facility]  MEDS: no meds currently  SH: unemployed, denies illicit drug use, but UDS from 11/6 were positive for cocaine. Had unprotected sex with main partner 2 months ago, but denies recent unprotected sex History  Substance Use Topics  . Smoking status: Current Everyday Smoker -- 0.5 packs/day    Types: Cigarettes  . Smokeless tobacco: Not on file   Comment: smoker for 5 years   . Alcohol Use: No    Review of Systems  Constitutional: Positive for fatigue. Negative for fever, chills and unexpected weight change.  HENT: Negative for congestion, rhinorrhea and sneezing.   Eyes: Negative.   Respiratory: Negative for cough and shortness of breath.   Cardiovascular: Negative for chest pain and palpitations.  Gastrointestinal: Negative for abdominal pain and diarrhea.  Genitourinary: Negative for dysuria, urgency, frequency, flank pain, vaginal bleeding, genital sores and pelvic pain.  Musculoskeletal: Negative  for myalgias, joint swelling and arthralgias.  Skin: Negative.   Neurological: Negative.   Hematological: Negative.  Negative for adenopathy.  Psychiatric/Behavioral: Negative.        Objective:   Physical Exam  Constitutional: She is oriented to person, place, and time. She appears well-developed. No distress.  HENT:  Head: Normocephalic and atraumatic.  Mouth/Throat: Oropharynx is clear and moist. No oropharyngeal exudate.  Eyes: Conjunctivae are normal. Pupils are equal, round, and reactive to light. Right eye exhibits no discharge. Left eye exhibits no discharge. No scleral icterus.  Neck: Normal range of motion. No JVD present. No thyromegaly present.  Cardiovascular: Normal rate, regular rhythm and normal heart sounds.  Exam reveals no gallop and no friction rub.   No murmur heard. Pulmonary/Chest: Breath sounds normal. No respiratory distress. She has no wheezes.  Abdominal: Soft. Bowel sounds are normal. She exhibits no distension. There is no tenderness. There is no rebound and no guarding.  Musculoskeletal: Normal range of motion. She exhibits no edema and no tenderness.  Lymphadenopathy:    She has no cervical adenopathy.  Neurological: She is alert and oriented to person, place, and time. She has normal reflexes. No cranial nerve deficit.  Skin: Skin is warm and dry. She is not diaphoretic. No erythema.  Psychiatric: She has a normal mood and affect.       Assessment & Plan:  HIV: patient is interested in starting therapy. We will arrange patient to start ADAP application with goal of starting complera daily  Chlamydia: will give patient azithromycin 1gm. GC  was negative. We will ask her to have her partner treated as well. At next visit, we will re-screen for GC/chlam to ensure she was not re-infected.  Birth Control: will have patient undergo PAP at our clinic and re-test for pregnancy, and give Depo inj.  Undernutrition: will set up with [facility], and also have rx for  ensure supplements  Smoking cessation = patient is pre-contemplative state, will continue to ask patient to decrease qty of smokes  RTC in 4 wks.

## 2011-10-27 ENCOUNTER — Telehealth: Payer: Self-pay | Admitting: *Deleted

## 2011-10-27 ENCOUNTER — Ambulatory Visit: Payer: Self-pay | Admitting: Internal Medicine

## 2011-10-27 NOTE — Telephone Encounter (Signed)
Message left at [phone] and [phone] to call [facility] to reschedule MD appt and make lab appt.

## 2011-11-03 ENCOUNTER — Ambulatory Visit: Payer: Self-pay | Admitting: Internal Medicine

## 2011-11-27 ENCOUNTER — Encounter: Payer: Self-pay | Admitting: *Deleted

## 2011-11-27 ENCOUNTER — Telehealth: Payer: Self-pay | Admitting: *Deleted

## 2011-11-27 NOTE — Telephone Encounter (Signed)
Message left to call [facility] for MD, Lab and PAP appts.  2nd message.

## 2011-11-27 NOTE — Progress Notes (Signed)
Patient ID: Tamara Briggs, female   DOB: 1985/09/23, 26 y.o.   MRN: [id number] Has not responded to previously left phone message to call for MD, Lab and PAP smear appts.  Left message again today.  Referring to [facility] Counselor to locate and encourage making keeping appts.

## 2011-12-03 ENCOUNTER — Other Ambulatory Visit: Payer: Self-pay

## 2011-12-04 ENCOUNTER — Other Ambulatory Visit: Payer: Self-pay

## 2011-12-04 ENCOUNTER — Other Ambulatory Visit: Payer: Self-pay | Admitting: *Deleted

## 2011-12-04 DIAGNOSIS — B20 Human immunodeficiency virus [HIV] disease: Secondary | ICD-10-CM

## 2011-12-17 ENCOUNTER — Ambulatory Visit: Payer: Self-pay | Admitting: Internal Medicine

## 2012-01-27 ENCOUNTER — Encounter: Payer: Self-pay | Admitting: *Deleted

## 2012-01-27 NOTE — Progress Notes (Signed)
Patient ID: Tamara Briggs, female   DOB: 03/20/1986, 26 y.o.   MRN: [id number] Previous [facility] referral 11/2011.  Re-referral for low CD4, high VL and no follow-up from LEEP at [facility] in 2010.

## 2012-02-15 ENCOUNTER — Encounter: Payer: Self-pay | Admitting: *Deleted

## 2012-02-15 NOTE — Progress Notes (Signed)
BC has sent multiple letters to every address on file for patient. BC has left multiple messages for patient and made a home visit. Patient avoids follow up for care. BC is unable to locate patient to offer services.

## 2012-05-10 ENCOUNTER — Emergency Department (HOSPITAL_COMMUNITY)
Admission: EM | Admit: 2012-05-10 | Discharge: 2012-05-11 | Disposition: A | Discharge status: Home / Self Care | Payer: Medicaid Other | Attending: Emergency Medicine | Admitting: Emergency Medicine

## 2012-05-10 ENCOUNTER — Encounter (HOSPITAL_COMMUNITY): Payer: Self-pay

## 2012-05-10 DIAGNOSIS — F17200 Nicotine dependence, unspecified, uncomplicated: Secondary | ICD-10-CM | POA: Insufficient documentation

## 2012-05-10 DIAGNOSIS — F411 Generalized anxiety disorder: Secondary | ICD-10-CM | POA: Insufficient documentation

## 2012-05-10 DIAGNOSIS — F1420 Cocaine dependence, uncomplicated: Secondary | ICD-10-CM | POA: Insufficient documentation

## 2012-05-10 DIAGNOSIS — F1910 Other psychoactive substance abuse, uncomplicated: Secondary | ICD-10-CM

## 2012-05-10 HISTORY — DX: Anxiety disorder, unspecified: F41.9

## 2012-05-10 HISTORY — DX: Cocaine use, unspecified, uncomplicated: F14.90

## 2012-05-10 LAB — COMPREHENSIVE METABOLIC PANEL
ALT: 13 U/L (ref 0–35)
AST: 24 U/L (ref 0–37)
Albumin: 3.7 g/dL (ref 3.5–5.2)
Alkaline Phosphatase: 68 U/L (ref 39–117)
BUN: 16 mg/dL (ref 6–23)
Chloride: 104 mEq/L (ref 96–112)
Potassium: 3.4 mEq/L — ABNORMAL LOW (ref 3.5–5.1)
Sodium: 141 mEq/L (ref 135–145)
Total Bilirubin: 0.1 mg/dL — ABNORMAL LOW (ref 0.3–1.2)
Total Protein: 9 g/dL — ABNORMAL HIGH (ref 6.0–8.3)

## 2012-05-10 LAB — CBC WITH DIFFERENTIAL/PLATELET
Basophils Absolute: 0 10*3/uL (ref 0.0–0.1)
Eosinophils Relative: 9 % — ABNORMAL HIGH (ref 0–5)
Lymphocytes Relative: 41 % (ref 12–46)
Lymphs Abs: 0.7 10*3/uL (ref 0.7–4.0)
MCV: 83.9 fL (ref 78.0–100.0)
Monocytes Relative: 16 % — ABNORMAL HIGH (ref 3–12)
Neutrophils Relative %: 32 % — ABNORMAL LOW (ref 43–77)
Platelets: 126 10*3/uL — ABNORMAL LOW (ref 150–400)
RBC: 4.47 MIL/uL (ref 3.87–5.11)
RDW: 13.4 % (ref 11.5–15.5)
WBC: 1.7 10*3/uL — ABNORMAL LOW (ref 4.0–10.5)

## 2012-05-10 LAB — ETHANOL: Alcohol, Ethyl (B): <11 mg/dL (ref 0–11)

## 2012-05-10 LAB — POCT PREGNANCY, URINE: Preg Test, Ur: NEGATIVE

## 2012-05-10 LAB — URINALYSIS, ROUTINE W REFLEX MICROSCOPIC
Glucose, UA: NEGATIVE mg/dL
Leukocytes, UA: NEGATIVE
Protein, ur: 30 mg/dL — AB
Specific Gravity, Urine: 1.028 (ref 1.005–1.030)
Urobilinogen, UA: 1 mg/dL (ref 0.0–1.0)

## 2012-05-10 LAB — RAPID URINE DRUG SCREEN, HOSP PERFORMED
Amphetamines: NOT DETECTED
Benzodiazepines: NOT DETECTED
Tetrahydrocannabinol: NOT DETECTED

## 2012-05-10 LAB — URINE MICROSCOPIC-ADD ON

## 2012-05-10 NOTE — ED Provider Notes (Signed)
History     CSN: [id number]  Arrival date & time 05/10/12  1946   First MD Initiated Contact with Patient 05/10/12 2142      Chief Complaint  Patient presents with  . Drug Problem    (Consider location/radiation/quality/duration/timing/severity/associated sxs/prior treatment) HPI History provided by pt.   Pt presents w/ request for cocaine detox.  Has been using for 3 years and has attempted detox on her own multiple times w/out success.  Has never been enrolled in a program.  Does not abuse any other substances.  Denies SI/HI.  Currently feels well.  Denies CP, SOB, abd pain, N/V/D.    Past Medical History  Diagnosis Date  . No pertinent past medical history   . Crack cocaine use   . Anxiety     Past Surgical History  Procedure Date  . Colposcopy 2009    History reviewed. No pertinent family history.  History  Substance Use Topics  . Smoking status: Current Every Day Smoker -- 0.5 packs/day    Types: Cigarettes  . Smokeless tobacco: Not on file   Comment: smoker for 5 years   . Alcohol Use: No    OB History    Grav Para Term Preterm Abortions TAB SAB Ect Mult Living   2 1 1  0 1 0 1 0 0 1      Review of Systems  All other systems reviewed and are negative.    Allergies  Review of patient's allergies indicates no known allergies.  Home Medications   Current Outpatient Rx  Name Route Sig Dispense Refill  . ACETAMINOPHEN-CODEINE #3 300-30 MG PO TABS Oral Take 1 tablet by mouth every 6 (six) hours as needed. For pain      BP 103/63  Temp 98.1 F (36.7 C) (Oral)  Resp 18  SpO2 100%  LMP 05/10/2012  Physical Exam  Nursing note and vitals reviewed. Constitutional: She is oriented to person, place, and time. She appears well-developed and well-nourished. No distress.  HENT:  Head: Normocephalic and atraumatic.  Eyes:       Normal appearance  Neck: Normal range of motion.  Cardiovascular: Normal rate and regular rhythm.   Pulmonary/Chest: Effort  normal and breath sounds normal. No respiratory distress.  Musculoskeletal: Normal range of motion.  Neurological: She is alert and oriented to person, place, and time.  Skin: Skin is warm and dry. No rash noted.  Psychiatric: She has a normal mood and affect. Her behavior is normal.    ED Course  Procedures (including critical care time)  Labs Reviewed  URINALYSIS, ROUTINE W REFLEX MICROSCOPIC - Abnormal; Notable for the following:    Hgb urine dipstick MODERATE (*)     Bilirubin Urine SMALL (*)     Ketones, ur 15 (*)     Protein, ur 30 (*)     All other components within normal limits  CBC WITH DIFFERENTIAL - Abnormal; Notable for the following:    WBC 1.7 (*)     Platelets 126 (*)     Neutrophils Relative 32 (*)     Monocytes Relative 16 (*)     Eosinophils Relative 9 (*)     Basophils Relative 2 (*)     Neutro Abs 0.5 (*)     All other components within normal limits  COMPREHENSIVE METABOLIC PANEL - Abnormal; Notable for the following:    Potassium 3.4 (*)     Total Protein 9.0 (*)     Total Bilirubin 0.1 (*)  All other components within normal limits  URINE RAPID DRUG SCREEN (HOSP PERFORMED) - Abnormal; Notable for the following:    Cocaine POSITIVE (*)     All other components within normal limits  URINE MICROSCOPIC-ADD ON - Abnormal; Notable for the following:    Squamous Epithelial / LPF MANY (*)     Bacteria, UA FEW (*)     Crystals CA OXALATE CRYSTALS (*)     All other components within normal limits  ETHANOL  POCT PREGNANCY, URINE   No results found.   1. Substance abuse       MDM  26yo F presents w/ request for detox from cocaine.  Cleared medically.  ACT team consulted.  Pt moved to Pod C for holding and orders written.           Otilio Miu, [place] 05/11/12 [phone]

## 2012-05-10 NOTE — ED Notes (Signed)
Pts belongings documented on inventory sheet, placed in two belongings bag and blck backpack pack and secured.

## 2012-05-10 NOTE — ED Notes (Signed)
Pt requesting detox from crack/cocaine, last used 3 hrs ago, reports using crack/cocaine x2 years, pt sts "I do as much as I can get every day>" pt unsure how much she uses daily. Pt denies SI/HI

## 2012-05-11 NOTE — Progress Notes (Signed)
Writer contacted [facility] regarding possible inpatient treatment and was advised that 14 day beds were available. Writer will need to complete screening with Shayla who advised that she would call back after morning meeting.  Awaiting return call to complete referral process.

## 2012-05-11 NOTE — ED Notes (Signed)
Pt given community resources by Irving Burton, unable to go to [facility] due to court date next week

## 2012-05-11 NOTE — BH Assessment (Addendum)
Assessment Note    Patient is a 26 year old [place] American female that requests detox from cocaine Patient has been using for 3 years and has attempted detox on her own multiple times without success.  Patient UDS was positive for cocaine.  Patient BAL was <11.  Patient denies any previous inpatient or outpatient treatment. Patient denies any prior counseling or psychiatric hospitalization. Patient denies any SI/HI.  Patient is unsure how much she uses daily.  Patient last used on 05-10-2012 at 5:00 p.m.    Patient denies any withdrawal symptoms.  Patient reports that she has been hearing someone try to talk to her for the past year.  Patient reports severe mood swings.  Patient reports feelings of depression and hopelessness due to her inability to stop using.    Axis I: Substance Abuse Mood Disorder  Axis II: Deferred Axis III:  Past Medical History  Diagnosis Date  . No pertinent past medical history   . Crack cocaine use   . Anxiety    Axis IV: economic problems, occupational problems, other psychosocial or environmental problems, problems related to social environment, problems with access to health care services and problems with primary support group Axis V: 31-40 impairment in reality testing  Past Medical History:  Past Medical History  Diagnosis Date  . No pertinent past medical history   . Crack cocaine use   . Anxiety     Past Surgical History  Procedure Date  . Colposcopy 2009    Family History: History reviewed. No pertinent family history.  Social History:  reports that she has been smoking Cigarettes.  She has been smoking about .5 packs per day. She does not have any smokeless tobacco history on file. She reports that she uses illicit drugs (Cocaine). She reports that she does not drink alcohol.  Additional Social History:     CIWA: CIWA-Ar BP: 89/47 mmHg COWS: Clinical Opiate Withdrawal Scale (COWS) Resting Pulse Rate: Pulse Rate 80 or below Sweating: No  report of chills or flushing Restlessness: Able to sit still Pupil Size: Pupils pinned or normal size for room light Bone or Joint Aches: Not present Runny Nose or Tearing: Not present GI Upset: No GI symptoms Tremor: No tremor Yawning: No yawning Anxiety or Irritability: Patient reports increasing irritability or anxiousness Gooseflesh Skin: Skin is smooth COWS Total Score: 1   Allergies: No Known Allergies  Home Medications:  (Not in a hospital admission)  OB/GYN Status:  Patient's last menstrual period was 05/10/2012.  General Assessment Data Location of Assessment: [facility] ED ACT Assessment: Yes Living Arrangements: Alone Can pt return to current living arrangement?: Yes Admission Status: Voluntary Is patient capable of signing voluntary admission?: Yes Transfer from: Acute Hospital Referral Source: Self/Family/Friend  Education Status Is patient currently in school?: No  Risk to self Suicidal Ideation: No Suicidal Intent: No Is patient at risk for suicide?: No Suicidal Plan?: No Access to Means: No What has been your use of drugs/alcohol within the last 12 months?: Cocaine Previous Attempts/Gestures: No How many times?: 0  Other Self Harm Risks: 0 Triggers for Past Attempts: None known Intentional Self Injurious Behavior: None Family Suicide History: No Recent stressful life event(s): Conflict (Comment);Financial Problems;Other (Comment) Persecutory voices/beliefs?: Yes Depression: Yes Depression Symptoms: Isolating;Fatigue;Guilt;Loss of interest in usual pleasures;Feeling worthless/self pity Substance abuse history and/or treatment for substance abuse?: Yes Suicide prevention information given to non-admitted patients: Yes  Risk to Others Homicidal Ideation: No Thoughts of Harm to Others: No Current Homicidal Intent:  No Current Homicidal Plan: No Access to Homicidal Means: No Identified Victim: None  History of harm to others?: No Assessment of Violence:  None Noted Violent Behavior Description: None Reported Does patient have access to weapons?: No Criminal Charges Pending?: No Does patient have a court date: No  Psychosis Hallucinations: Auditory Delusions: None noted  Mental Status Report Appear/Hygiene: Body odor;Disheveled;Poor hygiene Eye Contact: Fair Motor Activity: Freedom of movement Speech: Logical/coherent;Language other than English Level of Consciousness: Alert Mood: Depressed Affect: Depressed Anxiety Level: None Thought Processes: Coherent Judgement: Unimpaired Orientation: Person;Place;Time;Situation Obsessive Compulsive Thoughts/Behaviors: None  Cognitive Functioning Concentration: Decreased Memory: Recent Intact;Remote Intact IQ: Above Average Insight: Fair Impulse Control: Poor Appetite: Poor Weight Loss: 0  Weight Gain: 0  Sleep: Decreased Total Hours of Sleep: 5  Vegetative Symptoms: Not bathing  ADLScreening [facility] Assessment Services) Patient's cognitive ability adequate to safely complete daily activities?: Yes Patient able to express need for assistance with ADLs?: Yes Independently performs ADLs?: Yes (appropriate for developmental age)  Abuse/Neglect [facility]) Physical Abuse: Denies Verbal Abuse: Denies Sexual Abuse: Denies  Prior Inpatient Therapy Prior Inpatient Therapy: No Prior Therapy Dates: N/A Prior Therapy Facilty/Provider(s): N/A Reason for Treatment: None   Prior Outpatient Therapy Prior Outpatient Therapy: No Prior Therapy Dates: N/A Prior Therapy Facilty/Provider(s): N/A Reason for Treatment: None   ADL Screening (condition at time of admission) Patient's cognitive ability adequate to safely complete daily activities?: Yes Patient able to express need for assistance with ADLs?: Yes Independently performs ADLs?: Yes (appropriate for developmental age)       Abuse/Neglect Assessment (Assessment to be complete while patient is alone) Physical Abuse: Denies Verbal  Abuse: Denies Sexual Abuse: Denies Values / Beliefs Cultural Requests During Hospitalization: None Spiritual Requests During Hospitalization: None        Additional Information 1:1 In Past 12 Months?: No CIRT Risk: No Elopement Risk: No Does patient have medical clearance?: Yes     Disposition: Pending [facility]. Disposition Disposition of Patient: Referred to Patient referred to: Other (Comment)  On Site Evaluation by:   Reviewed with Physician:     Phillip Heal LaVerne 05/11/2012 4:49 AM
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
## 2012-05-11 NOTE — BHH Counselor (Signed)
Phillip Heal, ACT counselor, submitted Pt for admission to [facility]. Akeysha McMurren, AC confirmed bed availability. Gave clinical report to Verne Spurr, PA who declined Pt due to no inpatient criteria and recommended referrals to outpatient substance abuse treatment.  Harlin Rain Patsy Baltimore, LPC

## 2012-05-11 NOTE — ED Provider Notes (Signed)
Patient requesting to be discharged.  She apparently has a court date and cannot be admitted.  She is here voluntarily and is capable of making this decision.  She is welcome to return if she changes her mind.  Geoffery Lyons, MD 05/11/12 1002

## 2012-05-12 LAB — PATHOLOGIST SMEAR REVIEW

## 2012-05-12 NOTE — ED Provider Notes (Signed)
Medical screening examination/treatment/procedure(s) were performed by non-physician practitioner and as supervising physician I was immediately available for consultation/collaboration.   David H Yao, MD 05/12/12 0843 

## 2012-05-19 ENCOUNTER — Other Ambulatory Visit: Payer: Medicaid Other

## 2012-05-23 ENCOUNTER — Other Ambulatory Visit: Payer: Medicaid Other

## 2012-06-09 ENCOUNTER — Ambulatory Visit: Payer: Medicaid Other | Admitting: Internal Medicine

## 2012-07-19 ENCOUNTER — Emergency Department (HOSPITAL_COMMUNITY)
Admission: EM | Admit: 2012-07-19 | Discharge: 2012-07-21 | Disposition: A | Discharge status: Home / Self Care | Payer: Medicaid Other | Attending: Emergency Medicine | Admitting: Emergency Medicine

## 2012-07-19 ENCOUNTER — Encounter (HOSPITAL_COMMUNITY): Payer: Self-pay | Admitting: Emergency Medicine

## 2012-07-19 DIAGNOSIS — Z5900 Homelessness unspecified: Secondary | ICD-10-CM | POA: Insufficient documentation

## 2012-07-19 DIAGNOSIS — F411 Generalized anxiety disorder: Secondary | ICD-10-CM | POA: Insufficient documentation

## 2012-07-19 DIAGNOSIS — Z3202 Encounter for pregnancy test, result negative: Secondary | ICD-10-CM | POA: Insufficient documentation

## 2012-07-19 DIAGNOSIS — F1410 Cocaine abuse, uncomplicated: Secondary | ICD-10-CM

## 2012-07-19 DIAGNOSIS — F329 Major depressive disorder, single episode, unspecified: Secondary | ICD-10-CM | POA: Insufficient documentation

## 2012-07-19 DIAGNOSIS — F3289 Other specified depressive episodes: Secondary | ICD-10-CM | POA: Insufficient documentation

## 2012-07-19 DIAGNOSIS — F17200 Nicotine dependence, unspecified, uncomplicated: Secondary | ICD-10-CM | POA: Insufficient documentation

## 2012-07-19 DIAGNOSIS — E876 Hypokalemia: Secondary | ICD-10-CM | POA: Insufficient documentation

## 2012-07-19 DIAGNOSIS — B20 Human immunodeficiency virus [HIV] disease: Secondary | ICD-10-CM | POA: Insufficient documentation

## 2012-07-19 HISTORY — DX: Depression, unspecified: F32.A

## 2012-07-19 HISTORY — DX: Major depressive disorder, single episode, unspecified: F32.9

## 2012-07-19 LAB — URINALYSIS, ROUTINE W REFLEX MICROSCOPIC
Hgb urine dipstick: NEGATIVE
Nitrite: NEGATIVE
Protein, ur: 30 mg/dL — AB
Urobilinogen, UA: 2 mg/dL — ABNORMAL HIGH (ref 0.0–1.0)

## 2012-07-19 LAB — COMPREHENSIVE METABOLIC PANEL
ALT: 11 U/L (ref 0–35)
AST: 22 U/L (ref 0–37)
Albumin: 2.9 g/dL — ABNORMAL LOW (ref 3.5–5.2)
Calcium: 8.5 mg/dL (ref 8.4–10.5)
Chloride: 103 mEq/L (ref 96–112)
Creatinine, Ser: 0.69 mg/dL (ref 0.50–1.10)
Sodium: 139 mEq/L (ref 135–145)
Total Bilirubin: 0.2 mg/dL — ABNORMAL LOW (ref 0.3–1.2)

## 2012-07-19 LAB — CBC WITH DIFFERENTIAL/PLATELET
Basophils Absolute: 0 10*3/uL (ref 0.0–0.1)
Basophils Relative: 0 % (ref 0–1)
Eosinophils Absolute: 0 10*3/uL (ref 0.0–0.7)
Eosinophils Relative: 0 % (ref 0–5)
Hemoglobin: 9.9 g/dL — ABNORMAL LOW (ref 12.0–15.0)
MCH: 27.9 pg (ref 26.0–34.0)
MCHC: 32.6 g/dL (ref 30.0–36.0)
MCV: 85.6 fL (ref 78.0–100.0)
Metamyelocytes Relative: 0 %
Myelocytes: 0 %
RBC: 3.55 MIL/uL — ABNORMAL LOW (ref 3.87–5.11)

## 2012-07-19 LAB — ETHANOL: Alcohol, Ethyl (B): <11 mg/dL (ref 0–11)

## 2012-07-19 LAB — RAPID URINE DRUG SCREEN, HOSP PERFORMED
Barbiturates: NOT DETECTED
Benzodiazepines: NOT DETECTED

## 2012-07-19 LAB — URINE MICROSCOPIC-ADD ON

## 2012-07-19 LAB — POCT PREGNANCY, URINE: Preg Test, Ur: NEGATIVE

## 2012-07-19 MED ORDER — POTASSIUM CHLORIDE 20 MEQ/15ML (10%) PO LIQD
40.0000 meq | Freq: Once | ORAL | Status: AC
  Administered 2012-07-19: 40 meq via ORAL
  Filled 2012-07-19: qty 30
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
## 2012-07-19 NOTE — ED Provider Notes (Addendum)
History     CSN: [id number]  Arrival date & time 07/19/12  1928   First MD Initiated Contact with Patient 07/19/12 1944      Chief Complaint  Patient presents with  . Drug Problem    (Consider location/radiation/quality/duration/timing/severity/associated sxs/prior treatment) HPI  Marciana L Licano is a 26 y.o. female presenting for detox from crack cocaine. Patient has used crack for 2 years has been a daily user for the last year. Last use this a.m. She reports occasional alcohol use with no history of abuse or withdrawal. Patient denies suicidal ideation or homicidal ideation. She's recently been diagnosed with HIV has not started any medications because she was waiting for her Medicaid to become active. Patient denies any chest pain, palpitations, headache, abdominal pain, vomiting. Endorses nausea.  Past Medical History  Diagnosis Date  . Crack cocaine use   . Anxiety   . Depression     Past Surgical History  Procedure Date  . Colposcopy 2009    No family history on file.  History  Substance Use Topics  . Smoking status: Current Every Day Smoker -- 0.5 packs/day    Types: Cigarettes  . Smokeless tobacco: Not on file     Comment: smoker for 5 years   . Alcohol Use: No    OB History    Grav Para Term Preterm Abortions TAB SAB Ect Mult Living   2 1 1  0 1 0 1 0 0 1      Review of Systems  Allergies  Review of patient's allergies indicates no known allergies.  Home Medications  No current outpatient prescriptions on file.  BP 127/83  Pulse 94  Temp 98.2 F (36.8 C) (Oral)  Resp 16  SpO2 100%  LMP 06/30/2012  Physical Exam  Nursing note and vitals reviewed. Constitutional: She is oriented to person, place, and time. She appears well-developed and well-nourished.       Thin and frail  HENT:  Head: Normocephalic.  Eyes: Conjunctivae normal and EOM are normal. Pupils are equal, round, and reactive to light.  Neck: Normal range of motion.    Cardiovascular: Normal rate, regular rhythm and intact distal pulses.   Pulmonary/Chest: Effort normal and breath sounds normal. No stridor. No respiratory distress. She has no wheezes. She has no rales. She exhibits no tenderness.  Abdominal: Soft. Bowel sounds are normal. She exhibits no distension and no mass. There is no tenderness. There is no rebound and no guarding.  Musculoskeletal: Normal range of motion.  Neurological: She is alert and oriented to person, place, and time.  Skin: Skin is warm.  Psychiatric: She has a normal mood and affect.    ED Course  Procedures (including critical care time)  Labs Reviewed  URINE RAPID DRUG SCREEN (HOSP PERFORMED) - Abnormal; Notable for the following:    Cocaine POSITIVE (*)     All other components within normal limits  COMPREHENSIVE METABOLIC PANEL - Abnormal; Notable for the following:    Potassium 3.1 (*)     Albumin 2.9 (*)     Total Bilirubin 0.2 (*)     All other components within normal limits  CBC WITH DIFFERENTIAL - Abnormal; Notable for the following:    WBC 1.9 (*)     RBC 3.55 (*)     Hemoglobin 9.9 (*)     HCT 30.4 (*)     Platelets 142 (*)     Neutro Abs 0.9 (*)     All other components within  normal limits  URINALYSIS, ROUTINE W REFLEX MICROSCOPIC - Abnormal; Notable for the following:    APPearance CLOUDY (*)     Protein, ur 30 (*)     Urobilinogen, UA 2.0 (*)     Leukocytes, UA SMALL (*)     All other components within normal limits  URINE MICROSCOPIC-ADD ON - Abnormal; Notable for the following:    Squamous Epithelial / LPF MANY (*)     Bacteria, UA FEW (*)     Casts HYALINE CASTS (*)  GRANULAR CAST   All other components within normal limits  ETHANOL  POCT PREGNANCY, URINE  URINE CULTURE   No results found.   1. Cocaine abuse   2. HIV (human immunodeficiency virus infection)   3. Hypokalemia       MDM  Pt is pancytopenic, however these numbers are consistent with prior readings in September.  Last CD4 count was in 07/09/2011 at 33. Results discussed with attending Dr. Silverio Lay.  Patient is mildly hypokalemic at 3.1 I will replete her with 40 mEq by mouth.  Patient is medically cleared for psychiatric evaluation.  Discussed with Berna Spare from ACT team     Wynetta Emery, PA-C 07/19/12 2224   There is no inpatient detox for cocaine abuse therefore patient will need to be discharged for outpatient rehabilitation followup. Patient is homeless. She will be d/c'd in AM  [facility], PA-C 07/19/12 2341

## 2012-07-19 NOTE — ED Notes (Signed)
PT. REQUESTING DETOX FOR COCAINE ABUSE , LAST COCAINE THIS MORNING ,DENIES SUICIDAL IDEATION .

## 2012-07-19 NOTE — ED Notes (Signed)
PAPER SCRUBS GIVEN TO PT. /  SECURITY PAGED TO WAND PT.

## 2012-07-19 NOTE — ED Provider Notes (Signed)
Medical screening examination/treatment/procedure(s) were performed by non-physician practitioner and as supervising physician I was immediately available for consultation/collaboration.   Richardean Canal, MD 07/19/12 [phone]

## 2012-07-20 MED ORDER — DIPHENHYDRAMINE HCL 25 MG PO CAPS
50.0000 mg | ORAL_CAPSULE | Freq: Once | ORAL | Status: AC
  Administered 2012-07-20: 50 mg via ORAL
  Filled 2012-07-20: qty 2

## 2012-07-20 NOTE — ED Notes (Signed)
Care transferred and report given to [place], RN 

## 2012-07-20 NOTE — ED Notes (Signed)
Patient received meal tray 

## 2012-07-20 NOTE — ED Notes (Signed)
Spoke with P.J., case manager, she advised that she did contact Child psychotherapist for patient and that the social worker will be coming to see patient, but not sure of time.  Patient is aware.  Lunch has been ordered for patient.

## 2012-07-20 NOTE — ED Notes (Signed)
Patient to be discharged at 0830.  Case management called to see if they can check on shelters for patient to go to after discharge.

## 2012-07-20 NOTE — ED Notes (Signed)
Called case management and they advised will check to see if they can get patient a bed at a shelter.  Will continue to monitor until I hear back.
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
## 2012-07-21 LAB — URINE CULTURE

## 2012-07-21 NOTE — ED Notes (Signed)
Gave patient discharge instructions with a bus pass with information on free shelters and meals in the community area.

## 2012-07-21 NOTE — ED Notes (Signed)
First meeting with patient. Patient resting with NAD and has eaten breakfast.

## 2012-07-21 NOTE — ED Notes (Signed)
patie twas given discharge papers and instruction as well as list of resources by previous shift. Patient was able to contact family member to pick her up at short stay.

## 2012-07-25 ENCOUNTER — Other Ambulatory Visit: Payer: Self-pay | Admitting: *Deleted

## 2012-07-25 DIAGNOSIS — Z113 Encounter for screening for infections with a predominantly sexual mode of transmission: Secondary | ICD-10-CM

## 2012-07-25 DIAGNOSIS — Z79899 Other long term (current) drug therapy: Secondary | ICD-10-CM

## 2012-07-25 NOTE — ED Provider Notes (Signed)
Medical screening examination/treatment/procedure(s) were performed by non-physician practitioner and as supervising physician I was immediately available for consultation/collaboration.   Richardean Canal, MD 07/25/12 [phone]

## 2012-07-26 ENCOUNTER — Other Ambulatory Visit (HOSPITAL_COMMUNITY)
Admission: RE | Admit: 2012-07-26 | Discharge: 2012-07-26 | Disposition: A | Discharge status: Home / Self Care | Payer: Medicaid Other | Source: Ambulatory Visit | Attending: Internal Medicine | Admitting: Internal Medicine

## 2012-07-26 ENCOUNTER — Ambulatory Visit: Payer: Medicaid Other

## 2012-07-26 DIAGNOSIS — Z79899 Other long term (current) drug therapy: Secondary | ICD-10-CM

## 2012-07-26 DIAGNOSIS — B20 Human immunodeficiency virus [HIV] disease: Secondary | ICD-10-CM

## 2012-07-26 DIAGNOSIS — Z113 Encounter for screening for infections with a predominantly sexual mode of transmission: Secondary | ICD-10-CM | POA: Insufficient documentation

## 2012-07-27 LAB — LIPID PANEL
Cholesterol: 129 mg/dL (ref 0–200)
Triglycerides: 81 mg/dL (ref <150)

## 2012-07-27 LAB — CBC WITH DIFFERENTIAL/PLATELET
Eosinophils Absolute: 0 10*3/uL (ref 0.0–0.7)
Eosinophils Relative: 0 % (ref 0–5)
Lymphs Abs: 0.7 10*3/uL (ref 0.7–4.0)
MCH: 28.3 pg (ref 26.0–34.0)
MCV: 83.4 fL (ref 78.0–100.0)
Monocytes Relative: 18 % — ABNORMAL HIGH (ref 3–12)
Platelets: 173 10*3/uL (ref 150–400)
RBC: 3.67 MIL/uL — ABNORMAL LOW (ref 3.87–5.11)

## 2012-07-27 LAB — T-HELPER CELL (CD4) - (RCID CLINIC ONLY)
CD4 % Helper T Cell: 24 % — ABNORMAL LOW (ref 33–55)
CD4 T Cell Abs: 190 uL — ABNORMAL LOW (ref 400–2700)

## 2012-07-27 LAB — COMPLETE METABOLIC PANEL WITH GFR
BUN: 19 mg/dL (ref 6–23)
CO2: 31 mEq/L (ref 19–32)
Creat: 0.62 mg/dL (ref 0.50–1.10)
GFR, Est African American: >89 mL/min
GFR, Est Non African American: >89 mL/min
Glucose, Bld: 96 mg/dL (ref 70–99)
Total Bilirubin: 0.2 mg/dL — ABNORMAL LOW (ref 0.3–1.2)

## 2012-07-27 LAB — RPR

## 2012-08-02 ENCOUNTER — Ambulatory Visit (INDEPENDENT_AMBULATORY_CARE_PROVIDER_SITE_OTHER): Payer: Self-pay | Admitting: Internal Medicine

## 2012-08-02 ENCOUNTER — Encounter: Payer: Self-pay | Admitting: Internal Medicine

## 2012-08-02 VITALS — BP 99/60 | HR 97 | Temp 98.2°F | Ht 64.0 in | Wt 88.5 lb

## 2012-08-02 DIAGNOSIS — B20 Human immunodeficiency virus [HIV] disease: Secondary | ICD-10-CM

## 2012-08-02 DIAGNOSIS — R636 Underweight: Secondary | ICD-10-CM

## 2012-08-02 DIAGNOSIS — Z21 Asymptomatic human immunodeficiency virus [HIV] infection status: Secondary | ICD-10-CM

## 2012-08-02 MED ORDER — ELVITEG-COBIC-EMTRICIT-TENOFDF 150-150-200-300 MG PO TABS: 1.0000 | ORAL_TABLET | Freq: Every day | ORAL | Status: DC

## 2012-08-02 NOTE — Progress Notes (Signed)
[facility] CL[facility] NOTE  RFV: routine follow up Subjective:    Patient ID: Tamara Briggs, female    DOB: 05/24/1986, 26 y.o.   MRN: [id number]  HPI CD 4 count 190/ VL 255,500, HIV diagnosed roughly 1 year ago. Interested in starting HIV medications. No fever, chills,nightsweats.  Active Ambulatory Problems    Diagnosis Date Noted  . HIV (human immunodeficiency virus infection) 07/27/2011  . Chlamydia 07/27/2011  . Underweight 07/27/2011   Resolved Ambulatory Problems    Diagnosis Date Noted  . No Resolved Ambulatory Problems   Past Medical History  Diagnosis Date  . Crack cocaine use   . Anxiety   . Depression     Social hx: smokes 4-5 cigs per dya. No illicit drug use. Lives with a girlfriend  Review of Systems     Objective:   Physical Exam BP 99/60  Pulse 97  Temp 98.2 F (36.8 C) (Oral)  Ht 5\' 4"  (1.626 m)  Wt 88 lb 8 oz (40.143 kg)  BMI 15.19 kg/m2  SpO2 99%  LMP 06/30/2012 Physical Exam  Constitutional:  oriented to person, place, and time.  appears well-developed and well-nourished. No distress.  HENT:  Mouth/Throat: Oropharynx is clear and moist. No oropharyngeal exudate.  Cardiovascular: Normal rate, regular rhythm and normal heart sounds. Exam reveals no gallop and no friction rub.  No murmur heard.  Pulmonary/Chest: Effort normal and breath sounds normal. No respiratory distress. He has no wheezes.  Abdominal: Soft. Bowel sounds are normal.  no distension. There is no tenderness.  Lymphadenopathy:  no cervical adenopathy.   Skin: Skin is warm and dry. No rash noted. No erythema.        Assessment & Plan:  Hiv = will start stribild,  Will give pill box  Underweight = will give ensure  hiv prevention = uses condoms  rtc in 4 wks for rechecking how she is doing.

## 2012-08-09 ENCOUNTER — Ambulatory Visit: Payer: Medicaid Other | Admitting: Internal Medicine

## 2012-08-09 ENCOUNTER — Telehealth: Payer: Self-pay | Admitting: Licensed Clinical Social Worker

## 2012-08-09 NOTE — Telephone Encounter (Signed)
I called Medicaid and they will not cover stribild because she only has pregnancy medicaid, it will cover pregnancy related issues, birth control, and some antibiotics. I attempted to call both numbers listed on the patients chart and they were non working numbers. Patient will need to apply for ADAP if possible.

## 2012-08-10 ENCOUNTER — Telehealth: Payer: Self-pay | Admitting: *Deleted

## 2012-08-10 NOTE — Telephone Encounter (Addendum)
Pt lost "Pregnancy Medicaid" after delivery.  Reviewed pt's record.  Has not recently been pregnant.  Unsure what type of "Medicaid" the pt has previously had.

## 2012-08-10 NOTE — Telephone Encounter (Signed)
Discussed pt's situation with P. Jones and B. Jeraldine Loots.  Pt will call back to B. Hammond to schedule appointment to apply for ADAP and coordinate with P. Jones to obtain possible PAP for Stribild.

## 2012-08-30 ENCOUNTER — Ambulatory Visit: Payer: Medicaid Other | Admitting: Internal Medicine

## 2012-08-31 ENCOUNTER — Ambulatory Visit: Payer: Medicaid Other | Admitting: Internal Medicine

## 2012-09-16 ENCOUNTER — Emergency Department (HOSPITAL_BASED_OUTPATIENT_CLINIC_OR_DEPARTMENT_OTHER)
Admission: EM | Admit: 2012-09-16 | Discharge: 2012-09-16 | Disposition: A | Discharge status: Home / Self Care | Payer: Medicaid Other | Attending: Emergency Medicine | Admitting: Emergency Medicine

## 2012-09-16 ENCOUNTER — Encounter (HOSPITAL_BASED_OUTPATIENT_CLINIC_OR_DEPARTMENT_OTHER): Payer: Self-pay

## 2012-09-16 DIAGNOSIS — F319 Bipolar disorder, unspecified: Secondary | ICD-10-CM | POA: Insufficient documentation

## 2012-09-16 DIAGNOSIS — N898 Other specified noninflammatory disorders of vagina: Secondary | ICD-10-CM | POA: Insufficient documentation

## 2012-09-16 DIAGNOSIS — R51 Headache: Secondary | ICD-10-CM

## 2012-09-16 DIAGNOSIS — F209 Schizophrenia, unspecified: Secondary | ICD-10-CM | POA: Insufficient documentation

## 2012-09-16 DIAGNOSIS — F411 Generalized anxiety disorder: Secondary | ICD-10-CM | POA: Insufficient documentation

## 2012-09-16 DIAGNOSIS — F17200 Nicotine dependence, unspecified, uncomplicated: Secondary | ICD-10-CM | POA: Insufficient documentation

## 2012-09-16 DIAGNOSIS — A599 Trichomoniasis, unspecified: Secondary | ICD-10-CM | POA: Insufficient documentation

## 2012-09-16 DIAGNOSIS — Z21 Asymptomatic human immunodeficiency virus [HIV] infection status: Secondary | ICD-10-CM | POA: Insufficient documentation

## 2012-09-16 DIAGNOSIS — Z3202 Encounter for pregnancy test, result negative: Secondary | ICD-10-CM | POA: Insufficient documentation

## 2012-09-16 HISTORY — DX: Bipolar disorder, unspecified: F31.9

## 2012-09-16 HISTORY — DX: Asymptomatic human immunodeficiency virus (hiv) infection status: Z21

## 2012-09-16 HISTORY — DX: Schizophrenia, unspecified: F20.9

## 2012-09-16 LAB — URINE MICROSCOPIC-ADD ON

## 2012-09-16 LAB — WET PREP, GENITAL: Yeast Wet Prep HPF POC: NONE SEEN

## 2012-09-16 LAB — URINALYSIS, ROUTINE W REFLEX MICROSCOPIC
Bilirubin Urine: NEGATIVE
Glucose, UA: NEGATIVE mg/dL
Protein, ur: 30 mg/dL — AB

## 2012-09-16 LAB — PREGNANCY, URINE: Preg Test, Ur: NEGATIVE

## 2012-09-16 MED ORDER — TRAMADOL HCL 50 MG PO TABS
50.0000 mg | ORAL_TABLET | Freq: Once | ORAL | Status: AC
  Administered 2012-09-16: 50 mg via ORAL
  Filled 2012-09-16: qty 1

## 2012-09-16 MED ORDER — METRONIDAZOLE 500 MG PO TABS
2000.0000 mg | ORAL_TABLET | Freq: Once | ORAL | Status: AC
  Administered 2012-09-16: 2000 mg via ORAL
  Filled 2012-09-16: qty 4

## 2012-09-16 NOTE — ED Notes (Addendum)
C/o RLQ pain, HA and blurred vision x 2 weeks-pt has been at [facility] since 08/30/12 for crack abuse

## 2012-09-16 NOTE — ED Provider Notes (Signed)
History     CSN: [id number]  Arrival date & time 09/16/12  1433   First MD Initiated Contact with Patient 09/16/12 1449      Chief Complaint  Patient presents with  . Abdominal Pain    (Consider location/radiation/quality/duration/timing/severity/associated sxs/prior treatment) HPI Comments: Pt states that she has had chronic abdominal pain since she had a colposcopy in 2009:pt states that she has also been bleeding mid cycle which she will sometimes get with std:pt states that she is not sure if she has had discharge:pt states that she has also been having headaches that she gets intermittently that are relieved with ibuprofen:pt states that at [facility] that is what she is allowed to have:pt denies fever, n/v/d  Patient is a 27 y.o. female presenting with abdominal pain. The history is provided by the patient. No language interpreter was used.  Abdominal Pain The primary symptoms of the illness include abdominal pain and vaginal bleeding. The primary symptoms of the illness do not include nausea or vomiting. The current episode started 2 days ago. The onset of the illness was gradual. The problem has not changed since onset. Significant associated medical issues include HIV.    Past Medical History  Diagnosis Date  . Crack cocaine use   . Anxiety   . Depression   . Schizophrenia   . Bipolar disorder   . HIV test positive     Past Surgical History  Procedure Date  . Colposcopy 2009    No family history on file.  History  Substance Use Topics  . Smoking status: Current Every Day Smoker -- 0.4 packs/day    Types: Cigarettes  . Smokeless tobacco: Not on file     Comment: cutting back  . Alcohol Use: No    OB History    Grav Para Term Preterm Abortions TAB SAB Ect Mult Living   2 1 1  0 1 0 1 0 0 1      Review of Systems  Gastrointestinal: Positive for abdominal pain. Negative for nausea and vomiting.  Genitourinary: Positive for vaginal bleeding.    Allergies    Review of patient's allergies indicates no known allergies.  Home Medications   Current Outpatient Rx  Name  Route  Sig  Dispense  Refill  . IBUPROFEN 800 MG PO TABS   Oral   Take 800 mg by mouth every 8 (eight) hours as needed.         Marland Kitchen LURASIDONE HCL 40 MG PO TABS   Oral   Take by mouth daily with breakfast.         . SERTRALINE HCL 50 MG PO TABS   Oral   Take 50 mg by mouth daily.         Marland Kitchen ELVITEG-COBICIS-EMTRICIT-TENOF 150-150-200-300 MG PO TABS   Oral   Take 1 tablet by mouth daily with breakfast.   30 tablet   5     BP 118/67  Pulse 79  Temp 98.3 F (36.8 C) (Oral)  Resp 14  SpO2 100%  LMP 09/12/2012  Physical Exam  Genitourinary:       Vaginal bleeding:no cmt    ED Course  Procedures (including critical care time)  Labs Reviewed  URINALYSIS, ROUTINE W REFLEX MICROSCOPIC - Abnormal; Notable for the following:    APPearance CLOUDY (*)     Hgb urine dipstick MODERATE (*)     Protein, ur 30 (*)     Leukocytes, UA SMALL (*)     All other  components within normal limits  WET PREP, GENITAL - Abnormal; Notable for the following:    Trich, Wet Prep FEW (*)     Clue Cells Wet Prep HPF POC FEW (*)     WBC, Wet Prep HPF POC FEW (*)     All other components within normal limits  URINE MICROSCOPIC-ADD ON - Abnormal; Notable for the following:    Bacteria, UA FEW (*)     All other components within normal limits  PREGNANCY, URINE  GC/CHLAMYDIA PROBE AMP  URINE CULTURE   No results found.   1. Trichimoniasis   2. Headache       MDM  Headache is not different then normal:pt denies fever:no meningeal symptoms:pt treated for trich and cultures sent        Teressa Lower, NP 09/16/12 1620

## 2012-09-17 LAB — URINE CULTURE: Colony Count: 25000

## 2012-09-17 NOTE — ED Provider Notes (Signed)
Medical screening examination/treatment/procedure(s) were performed by non-physician practitioner and as supervising physician I was immediately available for consultation/collaboration.   Celene Kras, MD 09/17/12 [phone]

## 2012-09-21 NOTE — ED Notes (Signed)
+  Chlamydia Chart sent to EDP office for review.  

## 2012-09-23 NOTE — ED Notes (Signed)
rx for Azithromycin 250 mg tab # 4 .Take 4 tabs po Once.Written by Orlean Bradford needs to be called to pharmacy; Attempt made to contact patient -no answer.

## 2012-09-24 ENCOUNTER — Telehealth (HOSPITAL_COMMUNITY): Payer: Self-pay | Admitting: Emergency Medicine

## 2012-09-25 ENCOUNTER — Telehealth (HOSPITAL_COMMUNITY): Payer: Self-pay | Admitting: Emergency Medicine

## 2012-09-26 ENCOUNTER — Telehealth (HOSPITAL_COMMUNITY): Payer: Self-pay | Admitting: *Deleted

## 2012-09-26 NOTE — ED Notes (Unsigned)
Patient informed of positive results after id'd x 2 and informed of need to notify partner to be treated. Rx called to [facility] on [address] by Sander Nephew PFM @ [phone]

## 2012-09-29 ENCOUNTER — Ambulatory Visit: Payer: Medicaid Other | Admitting: Internal Medicine

## 2012-09-29 ENCOUNTER — Telehealth: Payer: Self-pay | Admitting: *Deleted

## 2012-09-29 NOTE — Telephone Encounter (Signed)
Called patient to try and reschedule her missed appt but got no answer. Left message for her to call the clinic when she can and reschedule. (*per recent ED visit patient tested positive for Chlamydia and not sure if she has been treated)

## 2012-10-11 ENCOUNTER — Ambulatory Visit: Payer: Medicaid Other | Admitting: Internal Medicine

## 2012-10-11 ENCOUNTER — Telehealth: Payer: Self-pay | Admitting: *Deleted

## 2012-10-11 NOTE — Telephone Encounter (Signed)
Left voicemail asking patient to please call re: missed appointment today. Will refer to [facility]. Andree Coss, RN

## 2012-11-16 ENCOUNTER — Encounter (HOSPITAL_COMMUNITY): Payer: Self-pay | Admitting: *Deleted

## 2012-11-16 ENCOUNTER — Emergency Department (HOSPITAL_COMMUNITY)
Admission: EM | Admit: 2012-11-16 | Discharge: 2012-11-16 | Disposition: A | Discharge status: Home / Self Care | Payer: Self-pay | Attending: Emergency Medicine | Admitting: Emergency Medicine

## 2012-11-16 DIAGNOSIS — Z760 Encounter for issue of repeat prescription: Secondary | ICD-10-CM | POA: Insufficient documentation

## 2012-11-16 DIAGNOSIS — Z79899 Other long term (current) drug therapy: Secondary | ICD-10-CM | POA: Insufficient documentation

## 2012-11-16 DIAGNOSIS — Z21 Asymptomatic human immunodeficiency virus [HIV] infection status: Secondary | ICD-10-CM | POA: Insufficient documentation

## 2012-11-16 DIAGNOSIS — G478 Other sleep disorders: Secondary | ICD-10-CM | POA: Insufficient documentation

## 2012-11-16 DIAGNOSIS — F411 Generalized anxiety disorder: Secondary | ICD-10-CM | POA: Insufficient documentation

## 2012-11-16 DIAGNOSIS — F17200 Nicotine dependence, unspecified, uncomplicated: Secondary | ICD-10-CM | POA: Insufficient documentation

## 2012-11-16 DIAGNOSIS — R51 Headache: Secondary | ICD-10-CM | POA: Insufficient documentation

## 2012-11-16 DIAGNOSIS — F209 Schizophrenia, unspecified: Secondary | ICD-10-CM | POA: Insufficient documentation

## 2012-11-16 DIAGNOSIS — F319 Bipolar disorder, unspecified: Secondary | ICD-10-CM | POA: Insufficient documentation

## 2012-11-16 MED ORDER — LURASIDONE HCL 40 MG PO TABS: 40.0000 mg | ORAL_TABLET | Freq: Every day | ORAL | Status: DC

## 2012-11-16 MED ORDER — SERTRALINE HCL 50 MG PO TABS: 50.0000 mg | ORAL_TABLET | Freq: Every day | ORAL | Status: DC
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
## 2012-11-16 NOTE — ED Provider Notes (Signed)
History     CSN: [id number]  Arrival date & time 11/16/12  2326   First MD Initiated Contact with Patient 11/16/12 2339      Chief Complaint  Patient presents with  . Medication Refill   HPI  History provided by the patient. Patient is a 27 year old female with history of polysubstance abuse, schizophrenia, bipolar disorder and HIV who presents with requests for prescriptions of refills of her bipolar and schizophrenia medications. Patient reports running out of these one to 2 weeks ago. She states she attempted to followup at [facility] to get refills was told she would need to come to the emergency room to be evaluated first. She does have some complaints of increased jitteriness, difficulty sleeping, and slight headache that she attributes to not having her medications. She denies any recent fever, cough, cold or congestion symptoms. She also has some depression but denies any SI or HI. She denies any additional symptoms. No other aggravating or alleviating factors.    Past Medical History  Diagnosis Date  . Crack cocaine use   . Anxiety   . Depression   . Schizophrenia   . Bipolar disorder   . HIV test positive     Past Surgical History  Procedure Laterality Date  . Colposcopy  2009    History reviewed. No pertinent family history.  History  Substance Use Topics  . Smoking status: Current Every Day Smoker -- 0.40 packs/day    Types: Cigarettes  . Smokeless tobacco: Not on file     Comment: cutting back  . Alcohol Use: No    OB History   Grav Para Term Preterm Abortions TAB SAB Ect Mult Living   2 1 1  0 1 0 1 0 0 1      Review of Systems  Constitutional: Negative for fever and chills.  Psychiatric/Behavioral: Negative for suicidal ideas and hallucinations.  All other systems reviewed and are negative.    Allergies  Review of patient's allergies indicates no known allergies.  Home Medications   Current Outpatient Rx  Name  Route  Sig  Dispense  Refill  .  elvitegravir-cobicistat-emtricitabine-tenofovir (STRIBILD) 150-150-200-300 MG TABS   Oral   Take 1 tablet by mouth daily with breakfast.   30 tablet   5   . ibuprofen (ADVIL,MOTRIN) 800 MG tablet   Oral   Take 800 mg by mouth every 8 (eight) hours as needed.         . lurasidone (LATUDA) 40 MG TABS   Oral   Take by mouth daily with breakfast.         . sertraline (ZOLOFT) 50 MG tablet   Oral   Take 50 mg by mouth daily.           BP 104/69  Pulse 85  Temp(Src) 98.4 F (36.9 C) (Oral)  Resp 16  SpO2 100%  Physical Exam  Nursing note and vitals reviewed. Constitutional: She is oriented to person, place, and time. She appears well-developed and well-nourished. No distress.  HENT:  Head: Normocephalic.  Cardiovascular: Normal rate and regular rhythm.   Pulmonary/Chest: Effort normal and breath sounds normal. No respiratory distress. She has no wheezes. She has no rales.  Abdominal: Soft. There is no tenderness.  Neurological: She is alert and oriented to person, place, and time. She has normal strength. No cranial nerve deficit or sensory deficit. Gait normal.  Skin: Skin is warm and dry. No rash noted.  Psychiatric: She has a normal mood and affect.  Her behavior is normal.    ED Course  Procedures      1. Medication refill       MDM  11:35PM patient seen and evaluated. Patient calm in no acute distress. No significant complaints aside from requesting refills of prescriptions.        Angus Seller, PA-C 11/17/12 [phone]

## 2012-11-17 NOTE — ED Provider Notes (Signed)
Medical screening examination/treatment/procedure(s) were performed by non-physician practitioner and as supervising physician I was immediately available for consultation/collaboration.  Demarus Latterell, MD 11/17/12 0603 

## 2012-12-15 ENCOUNTER — Ambulatory Visit (INDEPENDENT_AMBULATORY_CARE_PROVIDER_SITE_OTHER): Payer: Self-pay | Admitting: Internal Medicine

## 2012-12-15 ENCOUNTER — Encounter: Payer: Self-pay | Admitting: Internal Medicine

## 2012-12-15 VITALS — BP 121/70 | HR 112 | Temp 98.3°F | Wt 91.0 lb

## 2012-12-15 DIAGNOSIS — B20 Human immunodeficiency virus [HIV] disease: Secondary | ICD-10-CM

## 2012-12-15 DIAGNOSIS — Z21 Asymptomatic human immunodeficiency virus [HIV] infection status: Secondary | ICD-10-CM

## 2012-12-15 MED ORDER — SULFAMETHOXAZOLE-TRIMETHOPRIM 400-80 MG PO TABS: 1.0000 | ORAL_TABLET | Freq: Every day | ORAL | Status: DC

## 2012-12-15 MED ORDER — ELVITEG-COBIC-EMTRICIT-TENOFDF 150-150-200-300 MG PO TABS: 1.0000 | ORAL_TABLET | Freq: Every day | ORAL | Status: DC

## 2012-12-15 NOTE — Progress Notes (Signed)
[facility] CL[facility] NOTE  RFV: routine visit, last seen in dec 2013 Subjective:    Patient ID: Tamara Briggs, female    DOB: 27-Aug-1985, 27 y.o.   MRN: [id number]  HPI 27 yo F with HIV and schizophrenia, CD 4 count of 190/VL 255,419 (dec 2013); she was to start taking stribild at last visit but has not filled her prescription and now her adap has lapsed. She has patient assistance for psych medication. She also reports having Food insecurity. She is accompanied by new Paramedic. She reports difficulty with paperwork and understanding process. She is starting to attend drug abuse counseling sessions at [facility].    Current Outpatient Prescriptions on File Prior to Visit  Medication Sig Dispense Refill  . elvitegravir-cobicistat-emtricitabine-tenofovir (STRIBILD) 150-150-200-300 MG TABS Take 1 tablet by mouth daily with breakfast.  30 tablet  5  . ibuprofen (ADVIL,MOTRIN) 800 MG tablet Take 800 mg by mouth every 8 (eight) hours as needed.      . lurasidone (LATUDA) 40 MG TABS Take by mouth daily with breakfast.      . lurasidone (LATUDA) 40 MG TABS Take 1 tablet (40 mg total) by mouth daily with breakfast.  30 tablet  0  . sertraline (ZOLOFT) 50 MG tablet Take 50 mg by mouth daily.      . sertraline (ZOLOFT) 50 MG tablet Take 1 tablet (50 mg total) by mouth daily.  30 tablet  0   No current facility-administered medications on file prior to visit.   Active Ambulatory Problems    Diagnosis Date Noted  . HIV (human immunodeficiency virus infection) 07/27/2011  . Chlamydia 07/27/2011  . Underweight 07/27/2011   Resolved Ambulatory Problems    Diagnosis Date Noted  . No Resolved Ambulatory Problems   Past Medical History  Diagnosis Date  . Crack cocaine use   . Anxiety   . Depression   . Schizophrenia   . Bipolar disorder   . HIV test positive    Social and family hx unchanged since last visit; living grandmother, and her mother. Not completely sure that she is in a supportive  environment. Her hiv status is essentially ignored or not acknowledged   Review of Systems  Constitutional: Negative for fever, chills, diaphoresis, activity change, appetite change, fatigue and unexpected weight change.  HENT: Negative for congestion, sore throat, rhinorrhea, sneezing, trouble swallowing and sinus pressure.  Eyes: Negative for photophobia and visual disturbance.  Respiratory: Negative for cough, chest tightness, shortness of breath, wheezing and stridor.  Cardiovascular: Negative for chest pain, palpitations and leg swelling.  Gastrointestinal: Negative for nausea, vomiting, abdominal pain, diarrhea, constipation, blood in stool, abdominal distention and anal bleeding.  Genitourinary: Negative for dysuria, hematuria, flank pain and difficulty urinating.  Musculoskeletal: Negative for myalgias, back pain, joint swelling, arthralgias and gait problem.  Skin: Negative for color change, pallor, rash and wound.  Neurological: Negative for dizziness, tremors, weakness and light-headedness.  Hematological: Negative for adenopathy. Does not bruise/bleed easily.  Psychiatric/Behavioral: Negative for behavioral problems, confusion, sleep disturbance, dysphoric mood, decreased concentration and agitation.       Objective:   Physical Exam  BP 121/70  Pulse 112  Temp(Src) 98.3 F (36.8 C) (Oral)  Wt 91 lb (41.277 kg)  BMI 15.61 kg/m2  LMP 12/12/2012 Physical Exam  Constitutional: oriented to person, place, and time.  appears well-developed and well-nourished. No distress.  HENT:  Mouth/Throat: Oropharynx is clear and moist. No oropharyngeal exudate. Poor dentition Cardiovascular: Normal rate, regular rhythm and normal heart  sounds. Exam reveals no gallop and no friction rub.  No murmur heard.  Pulmonary/Chest: Effort normal and breath sounds normal. No respiratory distress. He has no wheezes.  Lymphadenopathy: no cervical adenopathy.  Neurological: He is alert and oriented  to person, place, and time.  Skin: Skin is warm and dry. No rash noted. No erythema.  Psychiatric: simple responses to questions        Assessment & Plan:  HIV = will check cd 4 count and viral load once adap is in place in 2-3 wk. Needs to do her adap application in order to get access to stribild  Health maintenance = up todate on vaccinations  Bipolar = continue wiht latuda and zoloft per psychiatry  rtc in 3 wks. Will also need adhernece counseling

## 2012-12-27 ENCOUNTER — Other Ambulatory Visit: Payer: Self-pay | Admitting: *Deleted

## 2012-12-27 DIAGNOSIS — B20 Human immunodeficiency virus [HIV] disease: Secondary | ICD-10-CM

## 2012-12-27 MED ORDER — ELVITEG-COBIC-EMTRICIT-TENOFDF 150-150-200-300 MG PO TABS: 1.0000 | ORAL_TABLET | Freq: Every day | ORAL | Status: DC

## 2012-12-27 MED ORDER — SULFAMETHOXAZOLE-TRIMETHOPRIM 400-80 MG PO TABS: 1.0000 | ORAL_TABLET | Freq: Every day | ORAL | Status: DC

## 2013-01-10 ENCOUNTER — Ambulatory Visit: Payer: Self-pay | Admitting: Internal Medicine

## 2013-02-08 ENCOUNTER — Telehealth: Payer: Self-pay | Admitting: *Deleted

## 2013-02-08 ENCOUNTER — Encounter: Payer: Self-pay | Admitting: *Deleted

## 2013-02-08 NOTE — Telephone Encounter (Signed)
Requested pt call [facility] to make PAP smear appt.  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

## 2013-02-27 ENCOUNTER — Ambulatory Visit: Payer: Self-pay | Admitting: Internal Medicine

## 2013-02-27 ENCOUNTER — Telehealth: Payer: Self-pay | Admitting: *Deleted

## 2013-02-27 NOTE — Telephone Encounter (Signed)
She should not be given another appointment-she should be told about our walk-in clinic for frequent no-shows. Thanks Asher Muir

## 2013-02-27 NOTE — Telephone Encounter (Signed)
Mitch and I will let her know when we are able to get in touch with her.

## 2013-02-27 NOTE — Telephone Encounter (Signed)
Called patient and did not get an answer was unable to leave a message. Spoke with Mitch with [facility] and he advised he was set to bring patient to her visit and at the last minute she backed out. He advised he will continue to try and get her to the clinic.

## 2013-03-07 ENCOUNTER — Ambulatory Visit (INDEPENDENT_AMBULATORY_CARE_PROVIDER_SITE_OTHER): Payer: Self-pay | Admitting: Internal Medicine

## 2013-03-07 ENCOUNTER — Encounter: Payer: Self-pay | Admitting: Internal Medicine

## 2013-03-07 VITALS — BP 110/71 | HR 93 | Temp 98.3°F | Wt 80.0 lb

## 2013-03-07 DIAGNOSIS — B20 Human immunodeficiency virus [HIV] disease: Secondary | ICD-10-CM

## 2013-03-07 DIAGNOSIS — E43 Unspecified severe protein-calorie malnutrition: Secondary | ICD-10-CM

## 2013-03-07 DIAGNOSIS — Z113 Encounter for screening for infections with a predominantly sexual mode of transmission: Secondary | ICD-10-CM

## 2013-03-07 MED ORDER — EMTRICITABINE-TENOFOVIR DF 200-300 MG PO TABS: 1.0000 | ORAL_TABLET | Freq: Every day | ORAL | Status: DC

## 2013-03-07 MED ORDER — ENSURE PO LIQD: 237.0000 mL | Freq: Two times a day (BID) | ORAL | Status: DC

## 2013-03-07 MED ORDER — DRONABINOL 5 MG PO CAPS: 5.0000 mg | ORAL_CAPSULE | Freq: Two times a day (BID) | ORAL | Status: DC

## 2013-03-07 MED ORDER — DOLUTEGRAVIR SODIUM 50 MG PO TABS: 50.0000 mg | ORAL_TABLET | Freq: Every day | ORAL | Status: DC

## 2013-03-07 MED ORDER — FLUCONAZOLE 200 MG PO TABS: 200.0000 mg | ORAL_TABLET | Freq: Every day | ORAL | Status: DC

## 2013-03-07 MED ORDER — SULFAMETHOXAZOLE-TRIMETHOPRIM 400-80 MG PO TABS: 1.0000 | ORAL_TABLET | Freq: Every day | ORAL | Status: DC

## 2013-03-07 MED ORDER — AZITHROMYCIN 600 MG PO TABS: 1200.0000 mg | ORAL_TABLET | ORAL | Status: DC

## 2013-03-07 NOTE — Progress Notes (Signed)
HPI: Tamara Briggs is a 28 y.o. female here as a walk-in visit.  Allergies: No Known Allergies  Vitals: Temp: 98.3 F (36.8 C) (07/15 1402) Temp src: Oral (07/15 1402) BP: 110/71 mmHg (07/15 1402) Pulse Rate: 93 (07/15 1402)  Past Medical History: Past Medical History  Diagnosis Date  . Crack cocaine use   . Anxiety   . Depression   . Schizophrenia   . Bipolar disorder   . HIV test positive     Social History: History   Social History  . Marital Status: Single    Spouse Name: N/A    Number of Children: N/A  . Years of Education: N/A   Social History Main Topics  . Smoking status: Current Every Day Smoker -- 0.40 packs/day    Types: Cigarettes  . Smokeless tobacco: None     Comment: cutting back  . Alcohol Use: No  . Drug Use: Yes    Special: Cocaine  . Sexually Active: None     Comment: pt declined condoms   Other Topics Concern  . None   Social History Narrative  . None    Previous Regimen: none  Current Regimen: none  Labs: HIV 1 RNA Quant (copies/mL)  Date Value  07/26/2012 255419*  07/09/2011 127000*     CD4 T Cell Abs (cmm)  Date Value  07/26/2012 190*  07/09/2011 290*     Hep B S Ab (no units)  Date Value  07/09/2011 POS*     Hepatitis B Surface Ag (no units)  Date Value  07/09/2011 NEGATIVE      HCV Ab (no units)  Date Value  07/09/2011 NEGATIVE     CrCl: The CrCl is unknown because both a height and weight (above a minimum accepted value) are required for this calculation.  Lipids:    Component Value Date/Time   CHOL 129 07/26/2012 1624   TRIG 81 07/26/2012 1624   HDL 43 07/26/2012 1624   CHOLHDL 3.0 07/26/2012 1624   VLDL 16 07/26/2012 1624   LDLCALC 70 07/26/2012 1624    Assessment: HIV - due to numerous missed appointments she has never started antiretroviral therapy.  She is on Latuda for schizophrenia which is contraindicated in combination with cobicistat or ritonavir.  Without changing her Latuda her  antiretroviral options are limited.    Recommendations: Start Tivicay + Truvada. Follow-up in 2 weeks to assess adherence Provided her with a complete printed medication list.  Madolyn Frieze, PharmD Clinical Infectious Disease Pharmacist [facility] 03/07/2013, 3:06 PM

## 2013-03-07 NOTE — Progress Notes (Signed)
[facility] CL[facility] NOTE  RFV: routine, but has missed numerous appt Subjective:    Patient ID: Tamara Briggs, female    DOB: November 19, 1985, 27 y.o.   MRN: [id number]  HPI 27 yo F with HIV and schizophrenia, CD 4 count of 190/VL 255,419 (dec 2013); she was to start taking stribild at last visit but has not filled her prescription, her adap is approved but pharmacy was unable to deliver meds since she has no phone. She has patient assistance for psych medication. She also reports having Food insecurity. She is accompanied by new bridge counselor, Licensed conveyancer.. She reports difficulty with paperwork and understanding process. She is starting to attend drug abuse counseling sessions at [facility].  Last seen in April, lost 11 lbs now at weight of 80 lb.  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 Lives with mom but moved to better public housing apt.  Current Outpatient Prescriptions on File Prior to Visit  Medication Sig Dispense Refill  . lurasidone (LATUDA) 40 MG TABS Take by mouth daily with breakfast.      . lurasidone (LATUDA) 40 MG TABS Take 1 tablet (40 mg total) by mouth daily with breakfast.  30 tablet  0  . sertraline (ZOLOFT) 50 MG tablet Take 50 mg by mouth daily.      . sertraline (ZOLOFT) 50 MG tablet Take 1 tablet (50 mg total) by mouth daily.  30 tablet  0  . ibuprofen (ADVIL,MOTRIN) 800 MG tablet Take 800 mg by mouth every 8 (eight) hours as needed.       No current facility-administered medications on file prior to visit.   Active Ambulatory Problems    Diagnosis Date Noted  . HIV (human immunodeficiency virus infection) 07/27/2011  . Chlamydia 07/27/2011  . Underweight 07/27/2011  . H/O abnormal cervical Papanicolaou smear 07/26/2008   Resolved Ambulatory Problems    Diagnosis Date Noted  . No Resolved Ambulatory Problems   Past Medical History  Diagnosis Date  . Crack cocaine use   . Anxiety   . Depression   . Schizophrenia   . Bipolar disorder   . HIV test positive      Review of Systems  + chills, nightsweats Weight loss, has some dysphagia with certain foods. Excessive sleepiness with meds. Other 12 point ROS is negative    Objective:   Physical Exam BP 110/71  Pulse 93  Temp(Src) 98.3 F (36.8 C) (Oral)  Wt 80 lb (36.288 kg)  BMI 13.73 kg/m2  LMP 03/06/2013 Physical Exam  Constitutional: oriented to person, place, and time. Chronically ill appearing cachetic, ashened skin Mouth/Throat: Oropharynx is clear and moist. + thrush Cardiovascular: Normal rate, regular rhythm and normal heart sounds. Exam reveals no gallop and no friction rub.  No murmur heard.  Pulmonary/Chest: Effort normal and breath sounds normal. No respiratory distress.  no wheezes.  Abdominal: Soft. Bowel sounds are normal.  exhibits no distension. There is no tenderness. scaphoid Lymphadenopathy: + cervical adenopathy.  Neurological: He is alert and oriented to person, place, and time.  Skin: Skin is warm and dry. No rash noted. No erythema.  Psychiatric: a normal mood and affect.  behavior is normal.          Assessment & Plan:  hiv = start truvada/tivicay daily. Will recheck labs. Reviewed Adherence counseling for 30in (greater than 50% of our visit)  oi proph = will start bactrim ss daily, azithromycin 1200mg  weekly, presumably she will have CD 4 count < 100  Thrush +/- eso candidiasis =  fluconazole 200mg  dialy  Malnutrition = will start marinol to help with appetite stimulant. Ensure.  STI work-up = given that she has hx of sex work, will check STI to see if needs to be treated  rtc in 2 wks

## 2013-03-08 LAB — CBC WITH DIFFERENTIAL/PLATELET
Basophils Absolute: 0 10*3/uL (ref 0.0–0.1)
Lymphocytes Relative: 25 % (ref 12–46)
Lymphs Abs: 0.6 10*3/uL — ABNORMAL LOW (ref 0.7–4.0)
MCV: 86.1 fL (ref 78.0–100.0)
Neutro Abs: 0.9 10*3/uL — ABNORMAL LOW (ref 1.7–7.7)
Neutrophils Relative %: 43 % (ref 43–77)
Platelets: 124 10*3/uL — ABNORMAL LOW (ref 150–400)
RBC: 3.52 MIL/uL — ABNORMAL LOW (ref 3.87–5.11)
RDW: 14.3 % (ref 11.5–15.5)
WBC: 2.2 10*3/uL — ABNORMAL LOW (ref 4.0–10.5)

## 2013-03-08 LAB — COMPLETE METABOLIC PANEL WITH GFR
ALT: 30 U/L (ref 0–35)
AST: 73 U/L — ABNORMAL HIGH (ref 0–37)
CO2: 29 mEq/L (ref 19–32)
Calcium: 8.6 mg/dL (ref 8.4–10.5)
Chloride: 101 mEq/L (ref 96–112)
Creat: 0.68 mg/dL (ref 0.50–1.10)
GFR, Est African American: >89 mL/min
Potassium: 3.5 mEq/L (ref 3.5–5.3)
Sodium: 136 mEq/L (ref 135–145)
Total Protein: 9.2 g/dL — ABNORMAL HIGH (ref 6.0–8.3)

## 2013-03-08 LAB — HIV-1 RNA ULTRAQUANT REFLEX TO GENTYP+: HIV-1 RNA Quant, Log: 6.09 {Log} — ABNORMAL HIGH (ref <1.30)

## 2013-03-08 LAB — T-HELPER CELL (CD4) - (RCID CLINIC ONLY): CD4 T Cell Abs: 50 uL — ABNORMAL LOW (ref 400–2700)

## 2013-03-10 LAB — HIV-1 GENOTYPR PLUS

## 2013-03-15 ENCOUNTER — Encounter: Payer: Self-pay | Admitting: Internal Medicine

## 2013-03-15 ENCOUNTER — Ambulatory Visit (INDEPENDENT_AMBULATORY_CARE_PROVIDER_SITE_OTHER): Payer: Medicaid Other | Admitting: Internal Medicine

## 2013-03-15 ENCOUNTER — Ambulatory Visit: Payer: Self-pay

## 2013-03-15 ENCOUNTER — Inpatient Hospital Stay (HOSPITAL_COMMUNITY)
Admission: AD | Admit: 2013-03-15 | Discharge: 2013-03-19 | DRG: 152 | Disposition: A | Discharge status: Home / Self Care | Payer: Self-pay | Source: Ambulatory Visit | Attending: Internal Medicine | Admitting: Internal Medicine

## 2013-03-15 ENCOUNTER — Encounter (HOSPITAL_COMMUNITY): Payer: Self-pay | Admitting: *Deleted

## 2013-03-15 ENCOUNTER — Inpatient Hospital Stay (HOSPITAL_COMMUNITY): Payer: Self-pay

## 2013-03-15 VITALS — BP 117/83 | HR 120 | Temp 98.4°F | Wt 79.0 lb

## 2013-03-15 DIAGNOSIS — B20 Human immunodeficiency virus [HIV] disease: Secondary | ICD-10-CM

## 2013-03-15 DIAGNOSIS — H60399 Other infective otitis externa, unspecified ear: Secondary | ICD-10-CM | POA: Diagnosis present

## 2013-03-15 DIAGNOSIS — F411 Generalized anxiety disorder: Secondary | ICD-10-CM | POA: Diagnosis present

## 2013-03-15 DIAGNOSIS — Z720 Tobacco use: Secondary | ICD-10-CM

## 2013-03-15 DIAGNOSIS — H60502 Unspecified acute noninfective otitis externa, left ear: Secondary | ICD-10-CM | POA: Diagnosis present

## 2013-03-15 DIAGNOSIS — E43 Unspecified severe protein-calorie malnutrition: Secondary | ICD-10-CM

## 2013-03-15 DIAGNOSIS — E46 Unspecified protein-calorie malnutrition: Secondary | ICD-10-CM | POA: Diagnosis present

## 2013-03-15 DIAGNOSIS — F319 Bipolar disorder, unspecified: Secondary | ICD-10-CM | POA: Diagnosis present

## 2013-03-15 DIAGNOSIS — A749 Chlamydial infection, unspecified: Secondary | ICD-10-CM | POA: Diagnosis present

## 2013-03-15 DIAGNOSIS — Z79899 Other long term (current) drug therapy: Secondary | ICD-10-CM

## 2013-03-15 DIAGNOSIS — F1410 Cocaine abuse, uncomplicated: Secondary | ICD-10-CM | POA: Diagnosis present

## 2013-03-15 DIAGNOSIS — Z8742 Personal history of other diseases of the female genital tract: Secondary | ICD-10-CM

## 2013-03-15 DIAGNOSIS — F1910 Other psychoactive substance abuse, uncomplicated: Secondary | ICD-10-CM | POA: Diagnosis present

## 2013-03-15 DIAGNOSIS — F17200 Nicotine dependence, unspecified, uncomplicated: Secondary | ICD-10-CM | POA: Diagnosis present

## 2013-03-15 DIAGNOSIS — H6590 Unspecified nonsuppurative otitis media, unspecified ear: Secondary | ICD-10-CM

## 2013-03-15 DIAGNOSIS — H6690 Otitis media, unspecified, unspecified ear: Principal | ICD-10-CM | POA: Diagnosis present

## 2013-03-15 DIAGNOSIS — H60392 Other infective otitis externa, left ear: Secondary | ICD-10-CM

## 2013-03-15 DIAGNOSIS — A549 Gonococcal infection, unspecified: Secondary | ICD-10-CM | POA: Diagnosis present

## 2013-03-15 DIAGNOSIS — J019 Acute sinusitis, unspecified: Secondary | ICD-10-CM | POA: Diagnosis present

## 2013-03-15 DIAGNOSIS — F209 Schizophrenia, unspecified: Secondary | ICD-10-CM | POA: Diagnosis present

## 2013-03-15 DIAGNOSIS — A5400 Gonococcal infection of lower genitourinary tract, unspecified: Secondary | ICD-10-CM | POA: Diagnosis present

## 2013-03-15 DIAGNOSIS — Z21 Asymptomatic human immunodeficiency virus [HIV] infection status: Secondary | ICD-10-CM

## 2013-03-15 DIAGNOSIS — I498 Other specified cardiac arrhythmias: Secondary | ICD-10-CM | POA: Diagnosis present

## 2013-03-15 DIAGNOSIS — B370 Candidal stomatitis: Secondary | ICD-10-CM | POA: Diagnosis present

## 2013-03-15 DIAGNOSIS — H6692 Otitis media, unspecified, left ear: Secondary | ICD-10-CM

## 2013-03-15 DIAGNOSIS — Z681 Body mass index (BMI) 19 or less, adult: Secondary | ICD-10-CM

## 2013-03-15 HISTORY — DX: Cocaine abuse, uncomplicated: F14.10

## 2013-03-15 HISTORY — DX: Tobacco use: Z72.0

## 2013-03-15 HISTORY — DX: Otitis media, unspecified, left ear: H66.92

## 2013-03-15 LAB — PREGNANCY, URINE: Preg Test, Ur: NEGATIVE

## 2013-03-15 LAB — URINALYSIS, ROUTINE W REFLEX MICROSCOPIC
Nitrite: NEGATIVE
Specific Gravity, Urine: 1.03 (ref 1.005–1.030)
Urobilinogen, UA: 0.2 mg/dL (ref 0.0–1.0)
pH: 6 (ref 5.0–8.0)

## 2013-03-15 LAB — URINE MICROSCOPIC-ADD ON

## 2013-03-15 LAB — CBC WITH DIFFERENTIAL/PLATELET
Basophils Relative: 2 % — ABNORMAL HIGH (ref 0–1)
Eosinophils Relative: 18 % — ABNORMAL HIGH (ref 0–5)
Hemoglobin: 10.8 g/dL — ABNORMAL LOW (ref 12.0–15.0)
Lymphocytes Relative: 26 % (ref 12–46)
Monocytes Relative: 18 % — ABNORMAL HIGH (ref 3–12)
Neutrophils Relative %: 36 % — ABNORMAL LOW (ref 43–77)
Platelets: 145 10*3/uL — ABNORMAL LOW (ref 150–400)
RBC: 3.6 MIL/uL — ABNORMAL LOW (ref 3.87–5.11)
Smear Review: ADEQUATE
WBC: 4 10*3/uL (ref 4.0–10.5)

## 2013-03-15 LAB — COMPREHENSIVE METABOLIC PANEL
ALT: 20 U/L (ref 0–35)
AST: 35 U/L (ref 0–37)
CO2: 28 mEq/L (ref 19–32)
Calcium: 8.4 mg/dL (ref 8.4–10.5)
Creatinine, Ser: 0.64 mg/dL (ref 0.50–1.10)
GFR calc Af Amer: >90 mL/min (ref >90)
GFR calc non Af Amer: >90 mL/min (ref >90)
Glucose, Bld: 98 mg/dL (ref 70–99)
Sodium: 135 mEq/L (ref 135–145)
Total Protein: 10.4 g/dL — ABNORMAL HIGH (ref 6.0–8.3)

## 2013-03-15 MED ORDER — LORATADINE 10 MG PO TABS
10.0000 mg | ORAL_TABLET | Freq: Every day | ORAL | Status: DC
  Administered 2013-03-15 – 2013-03-19 (×5): 10 mg via ORAL
  Filled 2013-03-15 (×5): qty 1

## 2013-03-15 MED ORDER — SODIUM CHLORIDE 0.9 % IV SOLN
INTRAVENOUS | Status: DC
  Administered 2013-03-15 – 2013-03-17 (×5): via INTRAVENOUS

## 2013-03-15 MED ORDER — DRONABINOL 2.5 MG PO CAPS
5.0000 mg | ORAL_CAPSULE | Freq: Two times a day (BID) | ORAL | Status: DC
  Administered 2013-03-15 – 2013-03-19 (×8): 5 mg via ORAL
  Filled 2013-03-15 (×8): qty 2

## 2013-03-15 MED ORDER — HYDROCODONE-ACETAMINOPHEN 5-325 MG PO TABS
1.0000 | ORAL_TABLET | ORAL | Status: DC | PRN
  Administered 2013-03-15: 2 via ORAL
  Filled 2013-03-15: qty 2

## 2013-03-15 MED ORDER — ONDANSETRON HCL 4 MG/2ML IJ SOLN: 4.0000 mg | Freq: Four times a day (QID) | INTRAMUSCULAR | Status: DC | PRN

## 2013-03-15 MED ORDER — ENOXAPARIN SODIUM 40 MG/0.4ML ~~LOC~~ SOLN
40.0000 mg | SUBCUTANEOUS | Status: DC
  Administered 2013-03-15: 40 mg via SUBCUTANEOUS
  Filled 2013-03-15 (×2): qty 0.4

## 2013-03-15 MED ORDER — LURASIDONE HCL 40 MG PO TABS
40.0000 mg | ORAL_TABLET | Freq: Every day | ORAL | Status: DC
  Administered 2013-03-15 – 2013-03-18 (×4): 40 mg via ORAL
  Filled 2013-03-15 (×5): qty 1

## 2013-03-15 MED ORDER — ONDANSETRON HCL 4 MG PO TABS: 4.0000 mg | ORAL_TABLET | Freq: Four times a day (QID) | ORAL | Status: DC | PRN

## 2013-03-15 MED ORDER — SENNOSIDES-DOCUSATE SODIUM 8.6-50 MG PO TABS: 1.0000 | ORAL_TABLET | Freq: Every evening | ORAL | Status: DC | PRN

## 2013-03-15 MED ORDER — AZITHROMYCIN 600 MG PO TABS
1200.0000 mg | ORAL_TABLET | ORAL | Status: DC
  Administered 2013-03-16: 1200 mg via ORAL
  Filled 2013-03-15: qty 2

## 2013-03-15 MED ORDER — TRAZODONE HCL 50 MG PO TABS
50.0000 mg | ORAL_TABLET | Freq: Every evening | ORAL | Status: DC | PRN
  Administered 2013-03-18: 50 mg via ORAL
  Filled 2013-03-15: qty 1

## 2013-03-15 MED ORDER — ACETAMINOPHEN 650 MG RE SUPP: 650.0000 mg | Freq: Four times a day (QID) | RECTAL | Status: DC | PRN

## 2013-03-15 MED ORDER — OXYMETAZOLINE HCL 0.05 % NA SOLN
2.0000 | Freq: Two times a day (BID) | NASAL | Status: DC
  Administered 2013-03-15 (×2): 2 via NASAL
  Filled 2013-03-15: qty 15

## 2013-03-15 MED ORDER — SERTRALINE HCL 50 MG PO TABS
50.0000 mg | ORAL_TABLET | Freq: Every day | ORAL | Status: DC
  Administered 2013-03-16 – 2013-03-19 (×4): 50 mg via ORAL
  Filled 2013-03-15 (×4): qty 1

## 2013-03-15 MED ORDER — ENSURE COMPLETE PO LIQD
237.0000 mL | Freq: Three times a day (TID) | ORAL | Status: DC
  Administered 2013-03-15 – 2013-03-19 (×13): 237 mL via ORAL

## 2013-03-15 MED ORDER — DRONABINOL 5 MG PO CAPS: 5.0000 mg | ORAL_CAPSULE | Freq: Two times a day (BID) | ORAL | Status: DC

## 2013-03-15 MED ORDER — DEXTROSE 5 % IV SOLN
2.0000 g | Freq: Once | INTRAVENOUS | Status: AC
  Administered 2013-03-15: 2 g via INTRAVENOUS
  Filled 2013-03-15: qty 2

## 2013-03-15 MED ORDER — ENSURE COMPLETE PO LIQD: 237.0000 mL | Freq: Two times a day (BID) | ORAL | Status: DC

## 2013-03-15 MED ORDER — CIPROFLOXACIN-HYDROCORTISONE 0.2-1 % OT SUSP
3.0000 [drp] | Freq: Two times a day (BID) | OTIC | Status: DC
  Administered 2013-03-15 – 2013-03-19 (×9): 3 [drp] via OTIC
  Filled 2013-03-15 (×2): qty 10

## 2013-03-15 MED ORDER — EMTRICITABINE-TENOFOVIR DF 200-300 MG PO TABS
1.0000 | ORAL_TABLET | Freq: Every day | ORAL | Status: DC
  Administered 2013-03-16 – 2013-03-19 (×4): 1 via ORAL
  Filled 2013-03-15 (×4): qty 1

## 2013-03-15 MED ORDER — ENSURE PO LIQD: 237.0000 mL | Freq: Three times a day (TID) | ORAL | Status: DC

## 2013-03-15 MED ORDER — ACETAMINOPHEN 325 MG PO TABS: 650.0000 mg | ORAL_TABLET | Freq: Four times a day (QID) | ORAL | Status: DC | PRN

## 2013-03-15 MED ORDER — DOXYCYCLINE HYCLATE 100 MG PO TABS
100.0000 mg | ORAL_TABLET | Freq: Two times a day (BID) | ORAL | Status: DC
  Administered 2013-03-15 – 2013-03-19 (×7): 100 mg via ORAL
  Filled 2013-03-15 (×10): qty 1

## 2013-03-15 MED ORDER — MORPHINE SULFATE 2 MG/ML IJ SOLN: 1.0000 mg | INTRAMUSCULAR | Status: DC | PRN

## 2013-03-15 MED ORDER — GUAIFENESIN ER 600 MG PO TB12
600.0000 mg | ORAL_TABLET | Freq: Two times a day (BID) | ORAL | Status: DC
  Administered 2013-03-15 – 2013-03-19 (×9): 600 mg via ORAL
  Filled 2013-03-15 (×10): qty 1

## 2013-03-15 MED ORDER — SULFAMETHOXAZOLE-TRIMETHOPRIM 400-80 MG PO TABS
1.0000 | ORAL_TABLET | Freq: Every day | ORAL | Status: DC
  Administered 2013-03-16 – 2013-03-19 (×4): 1 via ORAL
  Filled 2013-03-15 (×4): qty 1

## 2013-03-15 MED ORDER — PSEUDOEPHEDRINE HCL ER 120 MG PO TB12
120.0000 mg | ORAL_TABLET | Freq: Every day | ORAL | Status: DC
  Administered 2013-03-15 – 2013-03-19 (×5): 120 mg via ORAL
  Filled 2013-03-15 (×5): qty 1

## 2013-03-15 MED ORDER — SODIUM CHLORIDE 0.9 % IV BOLUS (SEPSIS)
1000.0000 mL | Freq: Once | INTRAVENOUS | Status: AC
  Administered 2013-03-15: 1000 mL via INTRAVENOUS

## 2013-03-15 MED ORDER — FLUCONAZOLE 200 MG PO TABS
200.0000 mg | ORAL_TABLET | Freq: Every day | ORAL | Status: DC
Start: 2013-03-16 — End: 2013-03-19
  Administered 2013-03-16 – 2013-03-19 (×4): 200 mg via ORAL
  Filled 2013-03-15 (×4): qty 1

## 2013-03-15 MED ORDER — DOLUTEGRAVIR SODIUM 50 MG PO TABS
50.0000 mg | ORAL_TABLET | Freq: Every day | ORAL | Status: DC
  Administered 2013-03-16 – 2013-03-19 (×4): 50 mg via ORAL
  Filled 2013-03-15 (×4): qty 1

## 2013-03-15 NOTE — Progress Notes (Signed)
[facility] NOTE  RFV: sick visit, uri and ear ache Subjective:    Patient ID: Tamara Briggs, female    DOB: June 11, 1986, 27 y.o.   MRN: [id number]  HPI 27yo F with HIV, CD 4 coutn 50(115)/VL 1,227,270 July 2014, just started taking tivicay/truvada x 7 days. She now reports having 2 days of Fevers, chills, nightsweats,  Nasal congestion, left ear tenderness, left sided LAD, decreased appetite, unable to keep food down. She was going to go to ED but came to clinic appt first.  Current Outpatient Prescriptions on File Prior to Visit  Medication Sig Dispense Refill  . azithromycin (ZITHROMAX) 600 MG tablet Take 2 tablets (1,200 mg total) by mouth every 7 (seven) days.  8 tablet  3  . dolutegravir (TIVICAY) 50 MG tablet Take 1 tablet (50 mg total) by mouth daily.  30 tablet  11  . dronabinol (MARINOL) 5 MG capsule Take 1 capsule (5 mg total) by mouth 2 (two) times daily before a meal.  60 capsule  3  . emtricitabine-tenofovir (TRUVADA) 200-300 MG per tablet Take 1 tablet by mouth daily.  30 tablet  11  . ENSURE (ENSURE) Take 237 mLs by mouth 2 (two) times daily between meals. Dispense 1-2  case per month; for severe protein-malnutrition, BMI 13  237 mL  11  . fluconazole (DIFLUCAN) 200 MG tablet Take 1 tablet (200 mg total) by mouth daily.  30 tablet  1  . ibuprofen (ADVIL,MOTRIN) 800 MG tablet Take 800 mg by mouth every 8 (eight) hours as needed.      . lurasidone (LATUDA) 40 MG TABS Take by mouth daily with breakfast.      . lurasidone (LATUDA) 40 MG TABS Take 1 tablet (40 mg total) by mouth daily with breakfast.  30 tablet  0  . sertraline (ZOLOFT) 50 MG tablet Take 50 mg by mouth daily.      . sertraline (ZOLOFT) 50 MG tablet Take 1 tablet (50 mg total) by mouth daily.  30 tablet  0  . sulfamethoxazole-trimethoprim (BACTRIM) 400-80 MG per tablet Take 1 tablet by mouth daily.  30 tablet  11   No current facility-administered medications on file prior to visit.      Review of Systems 12  point ROS is negative except what is mentioned in the HPI    Objective:   Physical Exam BP 117/83  Pulse 120  Temp(Src) 98.4 F (36.9 C) (Oral)  Wt 79 lb (35.834 kg)  BMI 13.55 kg/m2  LMP 03/06/2013 Physical Exam  Constitutional:  oriented to person, place, and time. Ill appearing. Tearful about left ear tenderness. HENT: TM left ext auditory canal is edematous, + erythema, opaque TM, right ext aud canal is clear. Neck + mild shotty LAD, tender. Mouth/Throat: Oropharynx is clear and moist. No oropharyngeal exudate.  Cardiovascular: tachycardia, regular rhythm and normal heart sounds. Exam reveals no gallop and no friction rub.  No murmur heard.  Pulmonary/Chest: Effort normal and breath sounds normal. No respiratory distress.  no wheezes.  Abdominal: scaphoid. Soft. Bowel sounds are normal.  exhibits no distension. There is no tenderness.  Lymphadenopathy:  no cervical adenopathy.  Neurological:  alert and oriented to person, place, and time.  Skin: Skin is warm and dry. No rash noted. No erythema.       Assessment & Plan:  URI symptoms with otitis externa/media =  Would like to be admitted since I am worried that she would decompensate further at home. She is afebrile here,  but mentions fever, rigors, and nightsweats at home x 2 days.  Concern for CAP  Nausea/anorexia = still had addn weight loss in a week, I think it will improve once she improves from her upper respiratory tract infection, but will need supplemental nutrition. May need to evaluate caloric intake while hospitalized  HIV = continue with tivicay/truvada, just started taking meds 1 wk ago  oi proph = continue with bactrim ds and azithro weekly  Thrush = currently on fluconazole 200mg  daily, improved  GC/CHlamydia + = will need treatment but coordinating her admit  Spoke with Dr. Suanne Marker for admission fevers in hiv

## 2013-03-15 NOTE — H&P (Addendum)
[facility] History and Physical  Tamara Briggs ZOX:[id number] DOB: May 05, 1986 DOA: 03/15/2013  Referring physician: Drue Second PCP: none   Chief Complaint: left ear pain  HPI: Tamara Briggs is a 27 y.o. female directly admitted from Dr. Gladstone Lighter office with reports of fevers and chills for 2 days, and acute left otitis externa and media, and concerns for CAP.  HIV positive and last CD 4 190/VL > 1 million.  Pt c/o sneezing, rhinorhea, severe left ear, jaw, maxillary pain and pressure.  Vomited once. Poor appetite. No diarrhea. Post nasal drip and cough.  No wheeze or dyspnea.  Initial reports of GC and chlamydia positive, but I am unable to find the report in Epic.  Weight loss ongoing and only weighs 80 lbs.  Review of Systems: systems reviewed and as above, otherwise negative.  Past Medical History  Diagnosis Date  . Crack cocaine use,    . Anxiety   . Depression   . Schizophrenia   . Bipolar disorder   . HIV    Past Surgical History  Procedure Laterality Date  . Colposcopy  2009   Social History: smokes cigarettes and crack cocaine. Last used crack 2 weeks ago. 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 Authorizing Provider  azithromycin (ZITHROMAX) 600 MG tablet Take 2 tablets (1,200 mg total) by mouth every 7 (seven) days. 03/07/13  Yes Judyann Munson, MD  dolutegravir (TIVICAY) 50 MG tablet Take 1 tablet (50 mg total) by mouth daily. 03/07/13  Yes Judyann Munson, MD  dronabinol (MARINOL) 5 MG capsule Take 1 capsule (5 mg total) by mouth 2 (two) times daily before a meal. 03/07/13  Yes Judyann Munson, MD  emtricitabine-tenofovir (TRUVADA) 200-300 MG per tablet Take 1 tablet by mouth daily. 03/07/13  Yes Judyann Munson, MD  ENSURE (ENSURE) Take 237 mLs by mouth 2 (two) times daily between meals. Dispense 1-2  case per month; for severe  protein-malnutrition, BMI 13 03/07/13  Yes Judyann Munson, MD  fluconazole (DIFLUCAN) 200 MG tablet Take 1 tablet (200 mg total) by mouth daily. 03/07/13  Yes Judyann Munson, MD  lurasidone (LATUDA) 40 MG TABS Take 40 mg by mouth at bedtime.    Yes Historical Provider, MD  sertraline (ZOLOFT) 50 MG tablet Take 50 mg by mouth daily.   Yes Historical Provider, MD  sulfamethoxazole-trimethoprim (BACTRIM) 400-80 MG per tablet Take 1 tablet by mouth daily. 03/07/13  Yes Judyann Munson, MD   Physical Exam: Filed Vitals:   03/15/13 1411  BP: 121/78  Pulse: 111  Temp: 98.2 F (36.8 C)  TempSrc: Oral  Resp: 22  Height: 5\' 4"  (1.626 m)  Weight: 36.016 kg (79 lb 6.4 oz)  SpO2: 98%   BP 93/58  Pulse 84  Temp(Src) 98.2 F (36.8 C) (Oral)  Resp 22  Ht 5\' 4"  (1.626 m)  Wt 36.016 kg (79 lb 6.4 oz)  BMI 13.62 kg/m2  SpO2 98%  LMP 03/06/2013  General Appearance:    Alert, cooperative, extremely thin. Oriented. uncomfortable  Head:    Normocephalic, without obvious abnormality, atraumatic  Eyes:    PERRL, conjunctiva/corneas clear, EOM's intact, fundi    benign, both eyes  Ears:    Right TM and canal ok. Left pinna, tragus very tender. Left canal edematous with whitish exudade. TM bulging and opaque  Nose:   Copious clear drainage, boggy turbinates. Left maxillary  sinus tender >> frontal sinus tenderness  Throat:   Lips, mucosa, and tongue normal; teeth and gums normal  Neck:   Supple, tender shotty submandibular nodes  Back:     Symmetric, no curvature, ROM normal, no CVA tenderness  Lungs:     Clear to auscultation bilaterally, respirations unlabored  Chest Wall:    No tenderness or deformity   Heart:    Fast, regular, no MGR     Abdomen:     Soft, non-tender, bowel sounds   Genitalia:    deferred  Rectal:    deferred  Extremities:   Extremities normal, atraumatic, no cyanosis or edema  Pulses:   2+ and symmetric all extremities  Skin:   Skin color, texture, turgor normal, no rashes or  lesions  Lymph nodes:   Cervical, supraclavicular, and axillary nodes normal  Neurologic:   CNII-XII intact, normal strength, sensation and reflexes    throughout    Psych: calm cooperative normal affect  Labs CXR pending  Assessment/Plan Principal Problem:   acute otitis media Active Problems:   HIV (human immunodeficiency virus infection) Reported GC/Chlam positive   Acute otitis externa of left ear Acute sinusitis   Unspecified protein-calorie malnutrition   Tobacco abuse   h/o crack Cocaine abuse   h/o oral Thrush Reported f/c: none here Sinus tachycardia  Will check stat CBC, CMET, CXR, UA, urine HCG.  Will give 2 gm rocephin, which would cover AOM, GC. If CXR shows pneumonia, can continue rocephin after today's dose.  Doxy bid to cover chlamydia, sinusitis. Afrin, decongestants and mucolytics.  Continue HIV meds. Bolus saline. Dietitian consult. Dr. Drue Second to follow along. Discussed plans with her.  Code Status: full Family Communication: none Disposition Plan: home  Time spent: 50 min  [name] [facility] Pager [phone]  If 7PM-7AM, please contact night-coverage [email or web address] Password [facility] 03/15/2013, 3:30 PM   Addendum: labs and CXR ok.

## 2013-03-16 ENCOUNTER — Inpatient Hospital Stay (HOSPITAL_COMMUNITY): Payer: Medicaid Other

## 2013-03-16 ENCOUNTER — Encounter (HOSPITAL_COMMUNITY): Payer: Self-pay | Admitting: Radiology

## 2013-03-16 DIAGNOSIS — E43 Unspecified severe protein-calorie malnutrition: Secondary | ICD-10-CM | POA: Diagnosis present

## 2013-03-16 DIAGNOSIS — J019 Acute sinusitis, unspecified: Secondary | ICD-10-CM

## 2013-03-16 DIAGNOSIS — A5400 Gonococcal infection of lower genitourinary tract, unspecified: Secondary | ICD-10-CM

## 2013-03-16 MED ORDER — ENOXAPARIN SODIUM 30 MG/0.3ML ~~LOC~~ SOLN
30.0000 mg | SUBCUTANEOUS | Status: DC
  Administered 2013-03-17: 30 mg via SUBCUTANEOUS
  Filled 2013-03-16 (×4): qty 0.3

## 2013-03-16 MED ORDER — AZITHROMYCIN 200 MG/5ML PO SUSR
1200.0000 mg | ORAL | Status: DC
  Administered 2013-03-16: 1200 mg via ORAL
  Filled 2013-03-16: qty 30

## 2013-03-16 MED ORDER — IOHEXOL 300 MG/ML  SOLN
100.0000 mL | Freq: Once | INTRAMUSCULAR | Status: AC | PRN
  Administered 2013-03-16: 75 mL via INTRAVENOUS

## 2013-03-16 MED ORDER — OXYMETAZOLINE HCL 0.05 % NA SOLN
2.0000 | Freq: Two times a day (BID) | NASAL | Status: AC
  Administered 2013-03-16 – 2013-03-18 (×4): 2 via NASAL
  Filled 2013-03-16: qty 15

## 2013-03-16 NOTE — Progress Notes (Signed)
INITIAL NUTRITION ASSESSMENT  DOCUMENTATION CODES Per approved criteria  -Severe malnutrition in the context of social or environmental circumstances -Underweight   INTERVENTION:  Ensure Complete twice daily (350 kcals, 13 gm protein per 8 fl oz bottle) RD to follow for nutrition care plan  NUTRITION DIAGNOSIS: Inadequate oral intake related to poor appetite as evidenced by patient report  Goal: Oral intake with meals & supplements to meet >/= 90% of estimated nutrition needs  Monitor:  PO & supplemental intake, weight, labs, I/O's  Reason for Assessment: Consult, Malnutrition Screening Tool Report  27 y.o. female  Admitting Dx: Otitis media  ASSESSMENT: Patient directly admitted from Dr. Gladstone Lighter office with reports of fevers and chills for 2 days, acute left otitis externa and media and concerns for CAP; patient is HIV positive.  Patient reports a poor appetite; very little consumed per lunch tray observation; she states she lives her Mom and sometimes they are without food; noted cocaine abuse; she would like Ensure supplements ---> RD to order.  Patient meets criteria for severe malnutrition in the context of social or environmental circumstances as evidenced by < 50% intake of estimated energy requirement for > 1 month and severe muscle loss (clavicles, shoulders, calfs) & severe subcutaneous fat loss (triceps/biceps).  Height: Ht Readings from Last 1 Encounters:  03/15/13 5\' 4"  (1.626 m)    Weight: Wt Readings from Last 1 Encounters:  03/16/13 84 lb 7 oz (38.3 kg)    Ideal Body Weight: 120 lb  % Ideal Body Weight: 70%  Wt Readings from Last 10 Encounters:  03/16/13 84 lb 7 oz (38.3 kg)  03/15/13 79 lb (35.834 kg)  03/07/13 80 lb (36.288 kg)  12/15/12 91 lb (41.277 kg)  08/02/12 88 lb 8 oz (40.143 kg)  07/27/11 93 lb (42.185 kg)  07/16/11 94 lb (42.638 kg)    Usual Body Weight: 91 lb  % Usual Body Weight: 92%  BMI:  Body mass index is 14.49  kg/(m^2).  Estimated Nutritional Needs: Kcal: 1350-1550 Protein: 65-75 gm Fluid: </= 1.5 L  Skin: Intact  Diet Order: General  EDUCATION NEEDS: -No education needs identified at this time   Intake/Output Summary (Last 24 hours) at 03/16/13 1435 Last data filed at 03/15/13 1904  Gross per 24 hour  Intake    480 ml  Output      0 ml  Net    480 ml    Labs:   Recent Labs Lab 03/15/13 1521  NA 135  K 4.0  CL 100  CO2 28  BUN 16  CREATININE 0.64  CALCIUM 8.4  GLUCOSE 98    CBG (last 3)   Recent Labs  03/16/13 0731  GLUCAP 80    Scheduled Meds: . azithromycin  1,200 mg Oral Q7 days  . ciprofloxacin-hydrocortisone  3 drop Left Ear BID  . dolutegravir  50 mg Oral Daily  . doxycycline  100 mg Oral Q12H  . dronabinol  5 mg Oral BID AC  . emtricitabine-tenofovir  1 tablet Oral Daily  . enoxaparin (LOVENOX) injection  30 mg Subcutaneous Q24H  . feeding supplement  237 mL Oral TID BM  . fluconazole  200 mg Oral Daily  . guaiFENesin  600 mg Oral BID  . loratadine  10 mg Oral Daily  . lurasidone  40 mg Oral QHS  . oxymetazoline  2 spray Each Nare BID  . pseudoephedrine  120 mg Oral Daily  . sertraline  50 mg Oral Daily  . sulfamethoxazole-trimethoprim  1 tablet Oral Daily    Continuous Infusions: . sodium chloride 100 mL/hr at 03/15/13 1856    Past Medical History  Diagnosis Date  . Crack cocaine use   . Anxiety   . Depression   . Schizophrenia   . Bipolar disorder   . 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 Otitis media of left ear 03/15/2013    Past Surgical History  Procedure Laterality Date  . Colposcopy  2009    Maureen Chatters, [place], LDN Pager #: [phone] After-Hours Pager #: [phone]

## 2013-03-16 NOTE — Progress Notes (Addendum)
PATIENT DETAILS Name: Tamara Briggs Age: 27 y.o. Sex: female Date of Birth: May 07, 1986 Admit Date: 03/15/2013 Admitting Physician Kela Millin, MD PCP: Judyann Munson, MD  Subjective: Patient reports feeling slightly better today, but still has severe left ear, jaw, and maxillary pain.  Her voice sounds muffled and she verified that it does sound different than normal.  She complains of arm pain where the IV is located and chest wall pain that is not worsened by inspiration. She did not have fevers or chills overnight last night.  She said she slept well and has an appetite today. Her latest CD4 was 50 and VL was > 1,000,000.  Assessment/Plan: Principal Problem:   acute otitis media Active Problems:   HIV (human immunodeficiency virus infection)   Chlamydia   Acute otitis externa of left ear   Unspecified protein-calorie malnutrition   Tobacco abuse   h/o crack Cocaine abuse   h/o oral Thrush   Gonorrhea   Acute sinusitis    Acute Otitis Media/Externa (left) - Severe pain and swelling - Head and Neck CT to rule out abscess - ENT consult pending results of CT - Afrin 2 spray BID - Claritin 10 mg daily - Sudafed 120 mg daily - Mucinex 600 mg BID - Pain medication - received Rocephin 2g x 1 at the time of admission - Cipro HC OTIC 3 drops BID - Will defer further antibiotic coverage to Infectious Disease  HIV/AIDS - Consult Dr. Drue Second - Azithromycin 1200 mg Q7 days for MAC prophylaxis - Bactrim 400-80 1 tabled daily  for PCP/TE prophylaxis - Fluconazole 200 mg daily for Coccidiomycosis prophylaxis - Continue Tivicay 50 mg daily, and Truvada 200-300 1 tablet dailly - latest CD4 was 50 and VL was > 1,000,000.  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  Schizophrenia -Stable -Continue Latuda and zoloft   Disposition: Remain inpatient  DVT Prophylaxis: Prophylactic Lovenox   Code Status: Full code or DNR  Family Communication none  Procedures: none  CONSULTS:  ID Dr. Drue Second   MEDICATIONS: Scheduled Meds: . azithromycin  1,200 mg Oral Q7 days  . ciprofloxacin-hydrocortisone  3 drop Left Ear BID  . dolutegravir  50 mg Oral Daily  . doxycycline  100 mg Oral Q12H  . dronabinol  5 mg Oral BID AC  . emtricitabine-tenofovir  1 tablet Oral Daily  . enoxaparin (LOVENOX) injection  30 mg Subcutaneous Q24H  . feeding supplement  237 mL Oral TID BM  . fluconazole  200 mg Oral Daily  . guaiFENesin  600 mg Oral BID  . loratadine  10 mg Oral Daily  . lurasidone  40 mg Oral QHS  . oxymetazoline  2 spray Each Nare BID  . pseudoephedrine  120 mg Oral Daily  . sertraline  50 mg Oral Daily  . sulfamethoxazole-trimethoprim  1 tablet Oral Daily   Continuous Infusions: . sodium chloride 100 mL/hr at 03/15/13 1856   PRN Meds:.acetaminophen, acetaminophen, HYDROcodone-acetaminophen, morphine injection, ondansetron (ZOFRAN) IV, ondansetron, senna-docusate, traZODone  Antibiotics: Anti-infectives   Start     Dose/Rate Route Frequency Ordered Stop   03/16/13 1200  azithromycin (ZITHROMAX) tablet 1,200 mg     1,200 mg Oral Every  7 days 03/15/13 1500     03/16/13 1000  dolutegravir (TIVICAY) tablet 50 mg     50 mg Oral Daily 03/15/13 1500     03/16/13 1000  emtricitabine-tenofovir (TRUVADA) 200-300 MG per tablet 1 tablet     1 tablet Oral Daily 03/15/13 1500     03/16/13 1000  fluconazole (DIFLUCAN) tablet 200 mg     200 mg Oral Daily 03/15/13 1500     03/16/13 1000  sulfamethoxazole-trimethoprim (BACTRIM,SEPTRA) 400-80 MG per tablet 1 tablet     1 tablet Oral Daily 03/15/13 1500     03/15/13 1800  doxycycline  (VIBRA-TABS) tablet 100 mg     100 mg Oral Every 12 hours 03/15/13 1531     03/15/13 1600  cefTRIAXone (ROCEPHIN) 2 g in dextrose 5 % 50 mL IVPB     2 g 100 mL/hr over 30 Minutes Intravenous  Once 03/15/13 1531 03/15/13 1927       PHYSICAL EXAM: Vital signs in last 24 hours: Filed Vitals:   03/15/13 1411 03/15/13 2116 03/16/13 0519  BP: 121/78 93/58 98/62   Pulse: 111 84 75  Temp: 98.2 F (36.8 C) 98.2 F (36.8 C) 97.7 F (36.5 C)  TempSrc: Oral Oral Oral  Resp: 22 22 18   Height: 5\' 4"  (1.626 m)    Weight: 36.016 kg (79 lb 6.4 oz)  38.3 kg (84 lb 7 oz)  SpO2: 98% 98% 99%    Weight change:  Filed Weights   03/15/13 1411 03/16/13 0519  Weight: 36.016 kg (79 lb 6.4 oz) 38.3 kg (84 lb 7 oz)   Body mass index is 14.49 kg/(m^2).   Gen Exam: Awake and alert with muffled voice, appears in pain. Acts younger than stated age. ENT: Tragus and auricle very painful to touch.  Ear canal is swollen and limits exam. Neck: swollen and severely TTP on left side, LAD on left Chest: B/L Clear, no accessory muscle use, no w/c/r CVS: RRR, no m/g/r Abdomen: soft, BS +, non tender, non distended. Extremities: no edema, lower extremities warm to touch. Neurologic: Non Focal.  CN grossly in tact. Musculoskeletal: TTP on left chest- medial to nipple, 5/5 strength in each extremity   Intake/Output from previous day:  Intake/Output Summary (Last 24 hours) at 03/16/13 1015 Last data filed at 03/15/13 1904  Gross per 24 hour  Intake    480 ml  Output      0 ml  Net    480 ml     LAB RESULTS: CBC  Recent Labs Lab 03/15/13 1521  WBC 4.0  HGB 10.8*  HCT 32.4*  PLT 145*  MCV 90.0  MCH 30.0  MCHC 33.3  RDW 13.6  LYMPHSABS 1.0  MONOABS 0.7  EOSABS 0.7  BASOSABS 0.1    Chemistries   Recent Labs Lab 03/15/13 1521  NA 135  K 4.0  CL 100  CO2 28  GLUCOSE 98  BUN 16  CREATININE 0.64  CALCIUM 8.4    CBG:  Recent Labs Lab 03/16/13 0731  GLUCAP 80     RADIOLOGY  STUDIES/RESULTS: Dg Chest 2 View  03/15/2013   *RADIOLOGY REPORT*  Clinical Data: Cough and fever.  History of HIV infection and smoking.  CHEST - 2 VIEW  Comparison: None.  Findings: The heart size and mediastinal contours are normal. The lungs are clear. There is no pleural effusion or pneumothorax. No acute osseous findings are identified.  Prominent nipple shadows are noted bilaterally.  IMPRESSION: No active cardiopulmonary process.  Original Report Authenticated By: Carey Bullocks, M.D.    Gerrit Friends, PA-S Algis Downs, PA-C  If 7PM-7AM, please contact night-coverage [email or web address] Password [email or web address] 03/16/2013, 10:15 AM   LOS: 1 day     Addendum  Patient seen and examined, chart and data base reviewed.  I agree with the above assessment and plan.  For full details please see Mrs. Algis Downs PA note.  On doxycycline and azithromycin, continue ART per ID.   Clint Lipps, MD [facility] Pager: [phone] 03/16/2013, 5:12 PM

## 2013-03-16 NOTE — Progress Notes (Signed)
Referral received per PA for malnutrition, however she does not have diabetes documented nor hyperglycemia. Ordered a RD/.dietician consult.. Please let us know if there are issues with DM Thank you, Lenor Coffin, RN, CNS, Diabetes Coordinator [phone])

## 2013-03-16 NOTE — Progress Notes (Signed)
Utilization review completed. Chistian Kasler, RN, BSN. 

## 2013-03-16 NOTE — Progress Notes (Signed)
Patient ID: Tamara Briggs, female   DOB: [birth year], 27 y.o.   MRN: [id number]         [facility]    Date of Admission:  03/15/2013           Day 2 doxycycline         Day 2 ciprofloxacin eardrops Principal Problem:   acute otitis media Active Problems:   HIV (human immunodeficiency virus infection)   Chlamydia   H/O abnormal cervical Papanicolaou smear   Acute otitis externa of left ear   Tobacco abuse   h/o crack Cocaine abuse   h/o oral Thrush   Gonorrhea   Acute sinusitis   Protein-calorie malnutrition, severe   . azithromycin  1,200 mg Oral Q7 days  . ciprofloxacin-hydrocortisone  3 drop Left Ear BID  . dolutegravir  50 mg Oral Daily  . doxycycline  100 mg Oral Q12H  . dronabinol  5 mg Oral BID AC  . emtricitabine-tenofovir  1 tablet Oral Daily  . enoxaparin (LOVENOX) injection  30 mg Subcutaneous Q24H  . feeding supplement  237 mL Oral TID BM  . fluconazole  200 mg Oral Daily  . guaiFENesin  600 mg Oral BID  . loratadine  10 mg Oral Daily  . lurasidone  40 mg Oral QHS  . oxymetazoline  2 spray Each Nare BID  . pseudoephedrine  120 mg Oral Daily  . sertraline  50 mg Oral Daily  . sulfamethoxazole-trimethoprim  1 tablet Oral Daily    Subjective: She says she is feeling better today. She is having less left ear pain. She does have some sore throat on the left side. She says she has trouble taking the large white pill (question weekly azithromycin) but otherwise is taking and tolerating her home medications well. According to her nurse she ate a good lunch today. Review of Systems: Pertinent items are noted in HPI.  Past Medical History  Diagnosis Date  . Crack cocaine use   . Anxiety   . Depression   . Schizophrenia   . Bipolar disorder   . 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 Otitis media of left ear 03/15/2013    History  Substance Use Topics  . Smoking status: Current Every Day Smoker -- 0.50 packs/day for 3 years    Types: Cigarettes  .  Smokeless tobacco: Never Used     Comment: cutting back  . Alcohol Use: No    History reviewed. No pertinent family history.  No Known Allergies  Objective: Temp:  [97.7 F (36.5 C)-98.2 F (36.8 C)] 97.7 F (36.5 C) (07/24 0519) Pulse Rate:  [75-84] 75 (07/24 0519) Resp:  [18-22] 18 (07/24 0519) BP: (93-98)/(58-62) 98/62 mmHg (07/24 0519) SpO2:  [98 %-99 %] 99 % (07/24 0519) Weight:  [38.3 kg (84 lb 7 oz)] 38.3 kg (84 lb 7 oz) (07/24 0519)  General: Alert and comfortable Skin: No rash Oral: Thrush or other oropharyngeal lesions Ears: No redness swelling or tenderness of her left ear and external canal. She is mildly tender over the mastoids but there is no redness or swelling. Lungs: Clear Cor: Regular S1 and S2 no murmurs Abdomen: Soft and nontender. No palpable masses Neuro: Alert with normal speech and conversation  Lab Results Lab Results  Component Value Date   WBC 4.0 03/15/2013   HGB 10.8* 03/15/2013   HCT 32.4* 03/15/2013   MCV 90.0 03/15/2013   PLT 145* 03/15/2013    Lab Results  Component Value Date  CREATININE 0.64 03/15/2013   BUN 16 03/15/2013   NA 135 03/15/2013   K 4.0 03/15/2013   CL 100 03/15/2013   CO2 28 03/15/2013    HIV 1 RNA Quant (copies/mL)  Date Value  03/07/2013 1610960*  07/26/2012 [id number]*  07/09/2011 127000*     CD4 T Cell Abs (cmm)  Date Value  03/07/2013 50*  07/26/2012 190*  07/09/2011 290*   Microbiology: Urine probes positive for GC and Chlamydia 03/07/2013 (listed under "urine cytology ancillary")  Studies/Results: Dg Chest 2 View  03/15/2013   *RADIOLOGY REPORT*  Clinical Data: Cough and fever.  History of HIV infection and smoking.  CHEST - 2 VIEW  Comparison: None.  Findings: The heart size and mediastinal contours are normal. The lungs are clear. There is no pleural effusion or pneumothorax. No acute osseous findings are identified.  Prominent nipple shadows are noted bilaterally.  IMPRESSION: No active cardiopulmonary process.    Original Report Authenticated By: Carey Bullocks, M.D.    Assessment: Appears that her left ear infection is improving. She's been on treatment for thrush and that is improving as well. She has not had any documented fever here in the hospital. She received ceftriaxone yesterday and we'll get 7 days of doxycycline for GC and chlamydia.  Plan: 1. Continue current antibiotic and antiretroviral regimen 2. Change azithromycin tablets to oral suspension form  Cliffton Asters, MD [facility] [phone] pager   [phone] cell 03/16/2013, 3:50 PM

## 2013-03-17 LAB — CBC
HCT: 26.4 % — ABNORMAL LOW (ref 36.0–46.0)
Hemoglobin: 8.6 g/dL — ABNORMAL LOW (ref 12.0–15.0)
RBC: 3.12 MIL/uL — ABNORMAL LOW (ref 3.87–5.11)
WBC: 7.5 10*3/uL (ref 4.0–10.5)

## 2013-03-17 NOTE — Progress Notes (Signed)
Patient ID: Tamara Briggs, female   DOB: July 31, 1986, 27 y.o.   MRN: [id number]         [facility]    Date of Admission:  03/15/2013           Day 3 doxycycline        Day 3 ciprofloxacin eardrops Principal Problem:   acute otitis media Active Problems:   HIV (human immunodeficiency virus infection)   Chlamydia   H/O abnormal cervical Papanicolaou smear   Acute otitis externa of left ear   Tobacco abuse   h/o crack Cocaine abuse   h/o oral Thrush   Gonorrhea   Acute sinusitis   Protein-calorie malnutrition, severe   . azithromycin  1,200 mg Oral Weekly  . ciprofloxacin-hydrocortisone  3 drop Left Ear BID  . dolutegravir  50 mg Oral Daily  . doxycycline  100 mg Oral Q12H  . dronabinol  5 mg Oral BID AC  . emtricitabine-tenofovir  1 tablet Oral Daily  . enoxaparin (LOVENOX) injection  30 mg Subcutaneous Q24H  . feeding supplement  237 mL Oral TID BM  . fluconazole  200 mg Oral Daily  . guaiFENesin  600 mg Oral BID  . loratadine  10 mg Oral Daily  . lurasidone  40 mg Oral QHS  . oxymetazoline  2 spray Each Nare BID  . pseudoephedrine  120 mg Oral Daily  . sertraline  50 mg Oral Daily  . sulfamethoxazole-trimethoprim  1 tablet Oral Daily    Subjective: She is still complaining of left ear pain.  Objective: Temp:  [97.8 F (36.6 C)-100 F (37.8 C)] 98.6 F (37 C) (07/25 1452) Pulse Rate:  [100-131] 100 (07/25 1452) Resp:  [18-20] 18 (07/25 1452) BP: (99-104)/(63-67) 99/63 mmHg (07/25 1452) SpO2:  [97 %-100 %] 99 % (07/25 1452) Weight:  [38.5 kg (84 lb 14 oz)] 38.5 kg (84 lb 14 oz) (07/25 0600)  General: She is resting quietly in bed but in no distress Skin: No rash Ears: Her left external canal remains swollen and tender Lungs: Clear Cor: Regular S1 and S2 no murmurs Abdomen: Nontender Alert with normal speech and conversation  Lab Results Lab Results  Component Value Date   WBC 7.5 03/17/2013   HGB 8.6* 03/17/2013   HCT 26.4*  03/17/2013   MCV 84.6 03/17/2013   PLT 199 03/17/2013    Lab Results  Component Value Date   CREATININE 0.64 03/15/2013   BUN 16 03/15/2013   NA 135 03/15/2013   K 4.0 03/15/2013   CL 100 03/15/2013   CO2 28 03/15/2013    Lab Results  Component Value Date   ALT 20 03/15/2013   AST 35 03/15/2013   ALKPHOS 89 03/15/2013   BILITOT 0.2* 03/15/2013      Microbiology: No results found for this or any previous visit (from the past 240 hour(s)).  Studies/Results: Dg Chest 2 View  03/15/2013   *RADIOLOGY REPORT*  Clinical Data: Cough and fever.  History of HIV infection and smoking.  CHEST - 2 VIEW  Comparison: None.  Findings: The heart size and mediastinal contours are normal. The lungs are clear. There is no pleural effusion or pneumothorax. No acute osseous findings are identified.  Prominent nipple shadows are noted bilaterally.  IMPRESSION: No active cardiopulmonary process.   Original Report Authenticated By: Carey Bullocks, M.D.   Ct Head W Wo Contrast  03/16/2013   *RADIOLOGY REPORT*  Clinical Data:  HIV/pains.  Left-sided neck pain.  Fever.  Dizziness.  Rule out abscess.  CT HEAD WITHOUT AND WITH CONTRAST  Technique: Contiguous axial images were obtained from the base of the skull through the vertex without and with intravenous contrast  Contrast: 75mL OMNIPAQUE IOHEXOL 300 MG/ML  SOLN  Comparison:   None.  Findings:  No acute cortical infarct, hemorrhage, mass lesion is present.  The ventricles are of normal size.  No significant extra- axial fluid collection is present.  The paranasal sinuses and mastoid air cells are clear.  The osseous skull is intact.  IMPRESSION: Negative CT of the head without and with contrast.  *RADIOLOGY REPORT*  CT NECK WITH CONTRAST  Technique:  Multidetector CT imaging of the neck was performed using the standard protocol following the bolus administration of intravenous contrast.  Contrast: 75mL OMNIPAQUE IOHEXOL 300 MG/ML  SOLN  Comparison:   None  Findings: No focal  mucosal or submucosal lesions are present.  The vocal cords are midline and symmetric.  Asymmetric enlarged left level II lymph nodes are present.  The largest node measures 12 x 21 x 8 mm.  Borderline right-sided level II lymph nodes are smaller.  No primary mass lesion is identified.  The bone windows are unremarkable.  The lung apices are clear.  IMPRESSION:  1.  Bilateral level II lymphadenopathy is more prominent on the left.  This likely represents primary adenopathy of HIV. 2.  No focal mucosal or submucosal lesions are evident. 3.  No other discrete mass or abscess.   Original Report Authenticated By: Marin Roberts, M.D.   Ct Soft Tissue Neck W Contrast  03/16/2013   *RADIOLOGY REPORT*  Clinical Data:  HIV/pains.  Left-sided neck pain.  Fever. Dizziness.  Rule out abscess.  CT HEAD WITHOUT AND WITH CONTRAST  Technique: Contiguous axial images were obtained from the base of the skull through the vertex without and with intravenous contrast  Contrast: 75mL OMNIPAQUE IOHEXOL 300 MG/ML  SOLN  Comparison:   None.  Findings:  No acute cortical infarct, hemorrhage, mass lesion is present.  The ventricles are of normal size.  No significant extra- axial fluid collection is present.  The paranasal sinuses and mastoid air cells are clear.  The osseous skull is intact.  IMPRESSION: Negative CT of the head without and with contrast.  *RADIOLOGY REPORT*  CT NECK WITH CONTRAST  Technique:  Multidetector CT imaging of the neck was performed using the standard protocol following the bolus administration of intravenous contrast.  Contrast: 75mL OMNIPAQUE IOHEXOL 300 MG/ML  SOLN  Comparison:   None  Findings: No focal mucosal or submucosal lesions are present.  The vocal cords are midline and symmetric.  Asymmetric enlarged left level II lymph nodes are present.  The largest node measures 12 x 21 x 8 mm.  Borderline right-sided level II lymph nodes are smaller.  No primary mass lesion is identified.  The bone windows  are unremarkable.  The lung apices are clear.  IMPRESSION:  1.  Bilateral level II lymphadenopathy is more prominent on the left.  This likely represents primary adenopathy of HIV. 2.  No focal mucosal or submucosal lesions are evident. 3.  No other discrete mass or abscess.   Original Report Authenticated By: Marin Roberts, M.D.    Assessment: Her primary acute problem is external otitis. She also has recently diagnosed Chlamydia and gonorrhea in the setting of uncontrolled HIV infection. She seems to be taking all of her medications well the hospital. I would continue oral doxycycline for 4 more days  and also continue her ciprofloxacin eardrops. I am not sure how stable or supportive her living situation he is currently. She says that she has been living with her mother but other notes over the past 7 months indicate that she has been living with a girlfriend or homeless. She is in using crack cocaine on a daily basis for years. She's been treated repeatedly for sexually transmitted diseases and I suspect that she has been treating sex for drugs. Based on outpatient records it appears that she has a great deal of difficulty understanding how to obtain her medications and take them correctly and consistently. I am not sure she will be able to manage her HIV infection as an outpatient without significantly more supervision.  Plan: 1. Continue doxycycline and ciprofloxacin eardrops 2. Continue current HIV regimen 3. Recommend social worker consult for evaluation of living situation 4. Please call me for infectious disease questions this weekend  Cliffton Asters, MD [facility] [phone] pager   [phone] cell 03/17/2013, 3:32 PM

## 2013-03-17 NOTE — Progress Notes (Signed)
Applied kpad to left side of head and left ear. Will continue to monitor patient. Nelda Marseille, RN

## 2013-03-17 NOTE — Progress Notes (Addendum)
PATIENT DETAILS Name: Tamara Briggs Age: 27 y.o. Sex: female Date of Birth: [birth year] Admit Date: 03/15/2013 Admitting Physician Kela Millin, MD PCP: Judyann Munson, MD  Subjective: Patient reports feeling better today, but has had low fevers overnight. She did not want Tylenol. Her ear pain has lessened, but her tragus and auricle are still quite painful to touch. She finds relief from her pain when using a heating pad.  We had her stop the heating pad this morning because of the low grade fevers.  She still has a lot of congestion, but does not want to try a saline rinse. Her appetite is good this morning.   Assessment/Plan: Principal Problem:   acute otitis media Active Problems:   HIV (human immunodeficiency virus infection)   Chlamydia   H/O abnormal cervical Papanicolaou smear   Acute otitis externa of left ear   Tobacco abuse   h/o crack Cocaine abuse   h/o oral Thrush   Gonorrhea   Acute sinusitis   Protein-calorie malnutrition, severe   Acute Otitis Media/Externa (left) - Severe pain and swelling - Head and Neck CT to rule out abscess - results showed lymphadenopathy secondary to HIV.  No abscess present. - Afrin 2 spray BID x 3 days only - Claritin 10 mg daily - Sudafed 120 mg daily - Mucinex 600 mg BID - Pain medication - received Rocephin 2g x 1 at the time of admission - Cipro HC OTIC 3 drops BID - Will defer further antibiotic coverage to Infectious Disease - Appreciate consultation  HIV/AIDS - Seen by Dr. Orvan Falconer 7/24- continue antibiotic and antiretroviral regimens - Azithromycin 1200 mg Q7 days for MAC prophylaxis - Bactrim 400-80 1 tabled daily  for PCP/TE prophylaxis - Fluconazole 200 mg daily for Coccidiomycosis prophylaxis - Continue Tivicay 50 mg daily, and Truvada 200-300 1 tablet dailly - latest CD4 was 50 and VL was > 1,000,000.  GC/Chlamydia - Doxycycline 100 mg Q12H - Received Rocephin 2g x1 at admission  Unspecified protein-calorie  malnutrition - Normal diet - Ensure complete feeding supplement 237 mL BID PO - Marinol 5 mg BID - nutrition consultation- continue with Ensure BID  Acute Sinusitis - Covered by Azithromycin and Doxycycline  Poly substance abuse / Sex Worker -Social Work Consultation for guidance and resources.  Schizophrenia -Stable -Continue Latuda and zoloft   Disposition: Remain inpatient.  Check to see if she will be accepted by [facility] for resp[facility] care to received HAART Meds and antibiotics.  DVT Prophylaxis: Prophylactic Lovenox   Code Status: Full code   Family Communication none  Procedures: none  CONSULTS:  ID Dr. Orvan Falconer   MEDICATIONS: Scheduled Meds: . azithromycin  1,200 mg Oral Weekly  . ciprofloxacin-hydrocortisone  3 drop Left Ear BID  . dolutegravir  50 mg Oral Daily  . doxycycline  100 mg Oral Q12H  . dronabinol  5 mg Oral BID AC  . emtricitabine-tenofovir  1 tablet Oral Daily  . enoxaparin (LOVENOX) injection  30 mg Subcutaneous Q24H  . feeding supplement  237 mL Oral TID BM  . fluconazole  200 mg Oral Daily  . guaiFENesin  600 mg Oral BID  . loratadine  10 mg Oral Daily  . lurasidone  40 mg Oral QHS  . oxymetazoline  2 spray Each Nare BID  . pseudoephedrine  120 mg Oral Daily  . sertraline  50 mg Oral Daily  . sulfamethoxazole-trimethoprim  1 tablet Oral Daily   Continuous Infusions: . sodium chloride 100 mL/hr at 03/17/13 1313  PRN Meds:.acetaminophen, acetaminophen, HYDROcodone-acetaminophen, morphine injection, ondansetron (ZOFRAN) IV, ondansetron, senna-docusate, traZODone  Antibiotics: Anti-infectives   Start     Dose/Rate Route Frequency Ordered Stop   03/16/13 1800  azithromycin (ZITHROMAX) 200 MG/5ML suspension 1,200 mg     1,200 mg Oral Weekly 03/16/13 1559     03/16/13 1200  azithromycin (ZITHROMAX) tablet 1,200 mg  Status:  Discontinued     1,200 mg Oral Every 7 days 03/15/13 1500 03/16/13 1559   03/16/13 1000  dolutegravir  (TIVICAY) tablet 50 mg     50 mg Oral Daily 03/15/13 1500     03/16/13 1000  emtricitabine-tenofovir (TRUVADA) 200-300 MG per tablet 1 tablet     1 tablet Oral Daily 03/15/13 1500     03/16/13 1000  fluconazole (DIFLUCAN) tablet 200 mg     200 mg Oral Daily 03/15/13 1500     03/16/13 1000  sulfamethoxazole-trimethoprim (BACTRIM,SEPTRA) 400-80 MG per tablet 1 tablet     1 tablet Oral Daily 03/15/13 1500     03/15/13 1800  doxycycline (VIBRA-TABS) tablet 100 mg     100 mg Oral Every 12 hours 03/15/13 1531     03/15/13 1600  cefTRIAXone (ROCEPHIN) 2 g in dextrose 5 % 50 mL IVPB     2 g 100 mL/hr over 30 Minutes Intravenous  Once 03/15/13 1531 03/15/13 1927       PHYSICAL EXAM: Vital signs in last 24 hours: Filed Vitals:   03/16/13 2301 03/16/13 2355 03/17/13 0600 03/17/13 1059  BP: 101/66  104/67   Pulse: 131 109 103   Temp: 99.6 F (37.6 C)  100 F (37.8 C) 97.8 F (36.6 C)  TempSrc: Oral  Oral Oral  Resp: 18  20   Height:      Weight:   38.5 kg (84 lb 14 oz)   SpO2: 97% 100% 99%     Weight change: 2.484 kg (5 lb 7.6 oz) Filed Weights   03/15/13 1411 03/16/13 0519 03/17/13 0600  Weight: 36.016 kg (79 lb 6.4 oz) 38.3 kg (84 lb 7 oz) 38.5 kg (84 lb 14 oz)   Body mass index is 14.56 kg/(m^2).   Gen Exam: Awake and alert with muffled voice. Acts younger than stated age. ENT: Tragus and auricle very painful to touch.  Ear canal is swollen and limits exam. Neck: swollen and TTP on left side, LAD on left Chest: B/L Clear, no accessory muscle use, no w/c/r CVS: RRR, no m/g/r Abdomen: soft, BS +, non tender, non distended. Extremities: no edema, lower extremities warm to touch. Neurologic: Non Focal.  CN grossly in tact. Musculoskeletal: TTP on left chest- medial to nipple, 5/5 strength in each extremity   Intake/Output from previous day:  Intake/Output Summary (Last 24 hours) at 03/17/13 1453 Last data filed at 03/17/13 0610  Gross per 24 hour  Intake 1356.67 ml   Output      0 ml  Net 1356.67 ml     LAB RESULTS: CBC  Recent Labs Lab 03/15/13 1521 03/17/13 0650  WBC 4.0 7.5  HGB 10.8* 8.6*  HCT 32.4* 26.4*  PLT 145* 199  MCV 90.0 84.6  MCH 30.0 27.6  MCHC 33.3 32.6  RDW 13.6 15.3  LYMPHSABS 1.0  --   MONOABS 0.7  --   EOSABS 0.7  --   BASOSABS 0.1  --     Chemistries   Recent Labs Lab 03/15/13 1521  NA 135  K 4.0  CL 100  CO2 28  GLUCOSE  98  BUN 16  CREATININE 0.64  CALCIUM 8.4    CBG:  Recent Labs Lab 03/16/13 0731  GLUCAP 80     RADIOLOGY STUDIES/RESULTS: Dg Chest 2 View  03/15/2013   *RADIOLOGY REPORT*  Clinical Data: Cough and fever.  History of HIV infection and smoking.  CHEST - 2 VIEW  Comparison: None.  Findings: The heart size and mediastinal contours are normal. The lungs are clear. There is no pleural effusion or pneumothorax. No acute osseous findings are identified.  Prominent nipple shadows are noted bilaterally.  IMPRESSION: No active cardiopulmonary process.   Original Report Authenticated By: Carey Bullocks, M.D.    Gerrit Friends, PA-S Algis Downs, PA-C  If 7PM-7AM, please contact night-coverage [email or web address] Password [email or web address] 03/17/2013, 2:53 PM   LOS: 2 days      Addendum  Patient seen and examined, chart and data base reviewed.  I agree with the above assessment and plan.  For full details please see Mrs. Algis Downs PA note.  Low-grade fever of 100, continue doxycycline and Cipro/prednisone eardrops.  Continue prophylactic antibiotics including azithromycin and Bactrim.   Clint Lipps, MD [facility] Pager: [phone] 03/17/2013, 7:23 PM

## 2013-03-17 NOTE — Care Management Note (Unsigned)
    Page 1 of 1   03/17/2013     12:11:24 PM   CARE MANAGEMENT NOTE 03/17/2013  Patient:  Tamara Briggs, Tamara Briggs   Account Number:  [id number]  Date Initiated:  03/16/2013  Documentation initiated by:  Letha Cape  Subjective/Objective Assessment:   dx acute otitis media, o42,  admit-lives with mom.  pta indep.     Action/Plan:   Anticipated DC Date:  03/18/2013   Anticipated DC Plan:  HOME/SELF CARE  In-house referral  Clinical Social Worker      DC Planning Services  CM consult  Medication Assistance  MATCH Program      Choice offered to / List presented to:             Status of service:  In process, will continue to follow Medicare Important Message given?   (If response is "NO", the following Medicare IM given date fields will be blank) Date Medicare IM given:   Date Additional Medicare IM given:    Discharge Disposition:    Per UR Regulation:  Reviewed for med. necessity/level of care/duration of stay  If discussed at Long Length of Stay Meetings, dates discussed:    Comments:  03/17/13 11;38 Deboah Ladona Ridgel RN, BSN [phone] Patient is showing today as medicaid potential, called [employer], she stated patient 's medicaid is showing inactive since 01/20/13. Patient will need to reapply for Medicaid.    Patient only had Family Planning Medicaid which does not cover any hospital expenses etc. Patient informed CSW that she gets her pysch meds from [facility], patient is also with the [facility] clinic where she should be getting her HAART meds from.  Patient will probably need medication help for any other meds prescrbed by MD.  Patient wanted to use the Match program for her abx,  I gave patient the Match Letter  to ast her in getting her abx which will not be on the $4 list.  03/16/13 Letha Cape RN, BSN [phone] patient lives with mom. pta indep.  Patient states she has medicaid for medications.

## 2013-03-18 LAB — GLUCOSE, CAPILLARY: Glucose-Capillary: 97 mg/dL (ref 70–99)

## 2013-03-18 NOTE — Progress Notes (Addendum)
Clinical Social Work Department BRIEF PSYCHOSOCIAL ASSESSMENT 03/18/2013  Patient:  Tamara Briggs, Tamara Briggs     Account Number:  [id number]     Admit date:  03/15/2013  Clinical Social Worker:  Lourdes Sledge  Date/Time:  03/18/2013 11:41 AM  Referred by:  Physician  Date Referred:  03/18/2013 Referred for  Homelessness   Other Referral:   Interview type:  Patient Other interview type:    PSYCHOSOCIAL DATA Living Status:  PARENTS Admitted from facility:   Level of care:   Primary support name:  Tamara Briggs [phone] Primary support relationship to patient:  PARENT Degree of support available:   Pt stated she will dc to her mothers home.    CURRENT CONCERNS Current Concerns  Post-Acute Placement   Other Concerns:    SOCIAL WORK ASSESSMENT / PLAN Weekend CSW received a referral for: "Per Infectious Disease please request respite care at [facility].  Per Dr. Drue Second they occasionally take patients with bad social situations - -for a brief period to take medications until she can get back up on their feet."    [facility] is for end of life care and at this moment would not be appropriate for pt. CSW spoke with pt and introduced herself and role. CSW explored pt living situation and whether she will have a safe place to dc when medically stable. Pt stated she will discharge with her mother Menora Briggs who is able to assist with pt medications and taking her to her appts. CSW requested pt mother's contact number pt provided number Tamara Briggs [phone]. Pt consented for CSW/hospital staff to speak to mom regarding dc plans.   CSW also explored hx of crack cocaine use. Pt states she last used a few months ago before going into [facility] for treatment. CSW explored how pt is able to maintain sobriety, pt stated, "I just don't think about it." CSW confirmed pt has a psychiatrist that she sees at [facility]. Pt denies any active drug use. Pt expressed no further concerns or CSW  needs.  CSW contacted pt mom to confirm pt will be able to stay with her at dc. Mom was very pleasant and confirmed she will provide care/transportation etc. For pt. CSW made mom aware that pt will dc sometime soon, mother appreciative.  CSW notified Dr. Arthor Captain regarding pt mother support available at dc.     CSW signing off pt to dc home with mother.   Assessment/plan status:   Other assessment/ plan:   Information/referral to community resources:   No resources needed at this time. Pt confirmed she has a psychiatrist she see's at [facility] to manage mental health/substance abuse needs. Pt encouraged to request CSW visit if additional needs arise.    PATIENT'S/FAMILY'S RESPONSE TO PLAN OF CARE: Pt presented alert and oriented however appeared lethargic. Pt confirmed she has a safe place to dc to when medically stable. CSW confirmed with mother that pt is able to dc with her at dc. CSW inquired whether CSW could provide additional support. CSW encouraged pt inform RN if pt was interested in speaking to CSW. CSW signing off however available if additional needs arise.       Theresia Bough, MSW, Theresia Majors [phone]

## 2013-03-18 NOTE — Progress Notes (Signed)
PATIENT DETAILS Name: Tamara Briggs Age: 27 y.o. Sex: female Date of Birth: Jan 30, 1986 Admit Date: 03/15/2013 Admitting Physician Kela Millin, MD PCP: Judyann Munson, MD  Subjective: No further fever, continue current antibiotics per ID recommendations.  Assessment/Plan: Principal Problem:   acute otitis media Active Problems:   HIV (human immunodeficiency virus infection)   Chlamydia   H/O abnormal cervical Papanicolaou smear   Acute otitis externa of left ear   Tobacco abuse   h/o crack Cocaine abuse   h/o oral Thrush   Gonorrhea   Acute sinusitis   Protein-calorie malnutrition, severe   Acute Otitis Media/Externa (left) - Severe pain and swelling - Head and Neck CT to rule out abscess - results showed lymphadenopathy secondary to HIV.  No abscess present. - Afrin 2 spray BID x 3 days only - Claritin 10 mg daily - Sudafed 120 mg daily - Mucinex 600 mg BID - Pain medication - received Rocephin 2g x 1 at the time of admission - Cipro HC OTIC 3 drops BID  HIV/AIDS - Azithromycin 1200 mg Q7 days for MAC prophylaxis - Bactrim 400-80 1 tabled daily  for PCP/TE prophylaxis - Fluconazole 200 mg daily for Coccidiomycosis prophylaxis - Continue Tivicay 50 mg daily, and Truvada 200-300 1 tablet dailly - latest CD4 was 50 and VL was > 1,000,000.  GC/Chlamydia - Doxycycline 100 mg Q12H - Received Rocephin 2g x1 at admission  Unspecified protein-calorie malnutrition - Normal diet - Ensure complete feeding supplement 237 mL BID PO - Marinol 5 mg BID - nutrition consultation- continue with Ensure BID  Acute Sinusitis - Covered by Azithromycin and Doxycycline  Poly substance abuse / Sex Worker -Social Work Consultation for guidance and resources.  Schizophrenia -Stable -Continue Latuda and zoloft   Disposition: Remain inpatient.  Check to see if she will be accepted by [facility] for respite care to received HAART Meds and antibiotics.  DVT  Prophylaxis: Prophylactic Lovenox   Code Status: Full code   Family Communication none  Procedures: none  CONSULTS:  ID Dr. Orvan Falconer   MEDICATIONS: Scheduled Meds: . azithromycin  1,200 mg Oral Weekly  . ciprofloxacin-hydrocortisone  3 drop Left Ear BID  . dolutegravir  50 mg Oral Daily  . doxycycline  100 mg Oral Q12H  . dronabinol  5 mg Oral BID AC  . emtricitabine-tenofovir  1 tablet Oral Daily  . enoxaparin (LOVENOX) injection  30 mg Subcutaneous Q24H  . feeding supplement  237 mL Oral TID BM  . fluconazole  200 mg Oral Daily  . guaiFENesin  600 mg Oral BID  . loratadine  10 mg Oral Daily  . lurasidone  40 mg Oral QHS  . oxymetazoline  2 spray Each Nare BID  . pseudoephedrine  120 mg Oral Daily  . sertraline  50 mg Oral Daily  . sulfamethoxazole-trimethoprim  1 tablet Oral Daily   Continuous Infusions:   PRN Meds:.acetaminophen, acetaminophen, HYDROcodone-acetaminophen, morphine injection, ondansetron (ZOFRAN) IV, ondansetron, senna-docusate, traZODone  Antibiotics: Anti-infectives   Start     Dose/Rate Route Frequency Ordered Stop   03/16/13 1800  azithromycin (ZITHROMAX) 200 MG/5ML suspension 1,200 mg     1,200 mg Oral Weekly 03/16/13 1559     03/16/13 1200  azithromycin (ZITHROMAX) tablet 1,200 mg  Status:  Discontinued     1,200 mg Oral Every 7 days 03/15/13 1500 03/16/13 1559   03/16/13 1000  dolutegravir (TIVICAY) tablet 50 mg     50 mg Oral Daily 03/15/13 1500  03/16/13 1000  emtricitabine-tenofovir (TRUVADA) 200-300 MG per tablet 1 tablet     1 tablet Oral Daily 03/15/13 1500     03/16/13 1000  fluconazole (DIFLUCAN) tablet 200 mg     200 mg Oral Daily 03/15/13 1500     03/16/13 1000  sulfamethoxazole-trimethoprim (BACTRIM,SEPTRA) 400-80 MG per tablet 1 tablet     1 tablet Oral Daily 03/15/13 1500     03/15/13 1800  doxycycline (VIBRA-TABS) tablet 100 mg     100 mg Oral Every 12 hours 03/15/13 1531     03/15/13 1600  cefTRIAXone (ROCEPHIN) 2 g in  dextrose 5 % 50 mL IVPB     2 g 100 mL/hr over 30 Minutes Intravenous  Once 03/15/13 1531 03/15/13 1927       PHYSICAL EXAM: Vital signs in last 24 hours: Filed Vitals:   03/17/13 1452 03/18/13 0559 03/18/13 0602 03/18/13 0704  BP: 99/63 85/49 90/50  97/53  Pulse: 100 87    Temp: 98.6 F (37 C) 97.7 F (36.5 C)    TempSrc: Oral Oral    Resp: 18 20    Height:      Weight:    38.3 kg (84 lb 7 oz)  SpO2: 99% 99%      Weight change:  Filed Weights   03/16/13 0519 03/17/13 0600 03/18/13 0704  Weight: 38.3 kg (84 lb 7 oz) 38.5 kg (84 lb 14 oz) 38.3 kg (84 lb 7 oz)   Body mass index is 14.49 kg/(m^2).   Gen Exam: Awake and alert with muffled voice. Acts younger than stated age. ENT: Tragus and auricle very painful to touch.  Ear canal is swollen and limits exam. Neck: swollen and TTP on left side, LAD on left Chest: B/L Clear, no accessory muscle use, no w/c/r CVS: RRR, no m/g/r Abdomen: soft, BS +, non tender, non distended. Extremities: no edema, lower extremities warm to touch. Neurologic: Non Focal.  CN grossly in tact. Musculoskeletal: TTP on left chest- medial to nipple, 5/5 strength in each extremity   Intake/Output from previous day:  Intake/Output Summary (Last 24 hours) at 03/18/13 1405 Last data filed at 03/18/13 0935  Gross per 24 hour  Intake    240 ml  Output      0 ml  Net    240 ml     LAB RESULTS: CBC  Recent Labs Lab 03/15/13 1521 03/17/13 0650  WBC 4.0 7.5  HGB 10.8* 8.6*  HCT 32.4* 26.4*  PLT 145* 199  MCV 90.0 84.6  MCH 30.0 27.6  MCHC 33.3 32.6  RDW 13.6 15.3  LYMPHSABS 1.0  --   MONOABS 0.7  --   EOSABS 0.7  --   BASOSABS 0.1  --     Chemistries   Recent Labs Lab 03/15/13 1521  NA 135  K 4.0  CL 100  CO2 28  GLUCOSE 98  BUN 16  CREATININE 0.64  CALCIUM 8.4    CBG:  Recent Labs Lab 03/16/13 0731  GLUCAP 80     RADIOLOGY STUDIES/RESULTS: Dg Chest 2 View  03/15/2013   *RADIOLOGY REPORT*  Clinical Data:  Cough and fever.  History of HIV infection and smoking.  CHEST - 2 VIEW  Comparison: None.  Findings: The heart size and mediastinal contours are normal. The lungs are clear. There is no pleural effusion or pneumothorax. No acute osseous findings are identified.  Prominent nipple shadows are noted bilaterally.  IMPRESSION: No active cardiopulmonary process.   Original Report Authenticated  By: Carey Bullocks, M.D.    [facility]  If 7PM-7AM, please contact night-coverage [email or web address] Password [email or web address] 03/18/2013, 2:05 PM   LOS: 3 days

## 2013-03-19 MED ORDER — CIPROFLOXACIN-HYDROCORTISONE 0.2-1 % OT SUSP: 3.0000 [drp] | Freq: Two times a day (BID) | OTIC | Status: DC

## 2013-03-19 MED ORDER — DOXYCYCLINE HYCLATE 100 MG PO TABS: 100.0000 mg | ORAL_TABLET | Freq: Two times a day (BID) | ORAL | Status: DC

## 2013-03-19 NOTE — Progress Notes (Signed)
Pt given discharge instructions, prescriptions, and medication assistance paperwork.  PIV removed. Pt taken via wheelchair to discharge location.

## 2013-03-19 NOTE — Discharge Summary (Signed)
Physician Discharge Summary  Tamara Briggs ZOX:[id number] DOB: [birth year] DOA: 03/15/2013  PCP: Default, Provider, MD  Admit date: 03/15/2013 Discharge date: 03/19/2013  Time spent: 40 minutes  Recommendations for Outpatient Follow-up:  1. Followup with Dr. Drue Briggs in 1 week.  Discharge Diagnoses:  Principal Problem:   acute otitis media Active Problems:   HIV (human immunodeficiency virus infection)   Chlamydia   H/O abnormal cervical Papanicolaou smear   Acute otitis externa of left ear   Tobacco abuse   h/o crack Cocaine abuse   h/o oral Thrush   Gonorrhea   Acute sinusitis   Protein-calorie malnutrition, severe   Discharge Condition: Stable  Diet recommendation: Regular diet  Filed Weights   03/17/13 0600 03/18/13 0704 03/19/13 0530  Weight: 38.5 kg (84 lb 14 oz) 38.3 kg (84 lb 7 oz) 39.372 kg (86 lb 12.8 oz)    History of present illness:  Tamara Briggs is a 27 y.o. female directly admitted from Tamara Briggs office with reports of fevers and chills for 2 days, and acute left otitis externa and media, and concerns for CAP. HIV positive and last CD 4 190/VL > 1 million. Pt c/o sneezing, rhinorhea, severe left ear, jaw, maxillary pain and pressure. Vomited once. Poor appetite. No diarrhea. Post nasal drip and cough. No wheeze or dyspnea. Initial reports of GC and chlamydia positive, but I am unable to find the report in Epic. Weight loss ongoing and only weighs 80 lbs.  Hospital Course:   1. Acute otitis media/externa: Left-sided, came in with severe pain and swelling, CT of the head and neck showed no evidence of abscess and lymphadenopathy likely secondary to HIV 80s. Patient was started on Mucinex, got 1 dose of 2 g of Rocephin and then continued on doxycycline with Cipro/Hydrocortizone otic drops. Patient did well and had no fever and chills discharge continue 5 more days of antibiotics. Medication provided from the hospital on discharge.  2. HIV/AIDS: CD4 count was  50 on 03/07/2013, patient is on Truvada and Tivicay. She is on prophylactic antibiotics including azithromycin 1200 mg every week and DS Bactrim daily as well as fluconazole 200 mg daily. Patient to followup with Tamara Briggs.  3. GC/Chlamydia: Received one dose of 2 g of Rocephin, doxycycline. Patient to complete 5 more days of doxycycline as outpatient.  4. Protein calorie malnutrition: Cannot rule out AIDS wasting syndrome, patient is on Marinol 5 mg twice a day, recommended to take Ensure complete.  5. Polysubstance abuse: Patient uses cocaine daily, per infectious disease notes she probably trades sex for drugs. Patient seen by the social worker and counseled extensively. Also social worker look into her disposition. Patient will go with her mother Mrs. Tamara Briggs, CSW contacted her mother and ensured that patient will have good support system at home.  Procedures:  None  Consultations:  Tamara Briggs of infectious disease.  Discharge Exam: Filed Vitals:   03/18/13 0704 03/18/13 1624 03/18/13 2158 03/19/13 0530  BP: 97/53 112/64 101/67 104/64  Pulse:  101 106 89  Temp:  99.4 F (37.4 C) 98.9 F (37.2 C) 97.4 F (36.3 C)  TempSrc:  Oral Oral Oral  Resp:  20 16 16   Height:      Weight: 38.3 kg (84 lb 7 oz)   39.372 kg (86 lb 12.8 oz)  SpO2:  100% 99% 97%   General: Alert and awake, oriented x3, not in any acute distress. HEENT: anicteric sclera, pupils reactive to light and accommodation, EOMI CVS: S1-S2  clear, no murmur rubs or gallops Chest: clear to auscultation bilaterally, no wheezing, rales or rhonchi Abdomen: soft nontender, nondistended, normal bowel sounds, no organomegaly Extremities: no cyanosis, clubbing or edema noted bilaterally Neuro: Cranial nerves II-XII intact, no focal neurological deficits  Discharge Instructions      Discharge Orders   Future Orders Complete By Expires     Increase activity slowly  As directed         Medication List          azithromycin 600 MG tablet  Commonly known as:  ZITHROMAX  Take 2 tablets (1,200 mg total) by mouth every 7 (seven) days.     ciprofloxacin-hydrocortisone otic suspension  Commonly known as:  CIPRO HC OTIC  Place 3 drops into the left ear 2 (two) times daily.     dolutegravir 50 MG tablet  Commonly known as:  TIVICAY  Take 1 tablet (50 mg total) by mouth daily.     doxycycline 100 MG tablet  Commonly known as:  VIBRA-TABS  Take 1 tablet (100 mg total) by mouth every 12 (twelve) hours.     dronabinol 5 MG capsule  Commonly known as:  MARINOL  Take 1 capsule (5 mg total) by mouth 2 (two) times daily before a meal.     emtricitabine-tenofovir 200-300 MG per tablet  Commonly known as:  TRUVADA  Take 1 tablet by mouth daily.     ENSURE  Take 237 mLs by mouth 2 (two) times daily between meals. Dispense 1-2  case per month; for severe protein-malnutrition, BMI 13     fluconazole 200 MG tablet  Commonly known as:  DIFLUCAN  Take 1 tablet (200 mg total) by mouth daily.     lurasidone 40 MG Tabs  Commonly known as:  LATUDA  Take 40 mg by mouth at bedtime.     sertraline 50 MG tablet  Commonly known as:  ZOLOFT  Take 50 mg by mouth daily.     sulfamethoxazole-trimethoprim 400-80 MG per tablet  Commonly known as:  BACTRIM  Take 1 tablet by mouth daily.       No Known Allergies Follow-up Information   Follow up with Tamara Munson, MD In 1 week.   [address] [place] [address] [phone]        The results of significant diagnostics from this hospitalization (including imaging, microbiology, ancillary and laboratory) are listed below for reference.    Significant Diagnostic Studies: Dg Chest 2 View  03/15/2013   *RADIOLOGY REPORT*  Clinical Data: Cough and fever.  History of HIV infection and smoking.  CHEST - 2 VIEW  Comparison: None.  Findings: The heart size and mediastinal contours are normal. The lungs are clear.  There is no pleural effusion or pneumothorax. No acute osseous findings are identified.  Prominent nipple shadows are noted bilaterally.  IMPRESSION: No active cardiopulmonary process.   Original Report Authenticated By: Tamara Briggs, M.D.   Ct Head W Wo Contrast  03/16/2013   *RADIOLOGY REPORT*  Clinical Data:  HIV/pains.  Left-sided neck pain.  Fever. Dizziness.  Rule out abscess.  CT HEAD WITHOUT AND WITH CONTRAST  Technique: Contiguous axial images were obtained from the base of the skull through the vertex without and with intravenous contrast  Contrast: 75mL OMNIPAQUE IOHEXOL 300 MG/ML  SOLN  Comparison:   None.  Findings:  No acute cortical infarct, hemorrhage, mass lesion is present.  The ventricles are of normal size.  No significant extra- axial fluid collection is present.  The paranasal sinuses and mastoid air cells are clear.  The osseous skull is intact.  IMPRESSION: Negative CT of the head without and with contrast.  *RADIOLOGY REPORT*  CT NECK WITH CONTRAST  Technique:  Multidetector CT imaging of the neck was performed using the standard protocol following the bolus administration of intravenous contrast.  Contrast: 75mL OMNIPAQUE IOHEXOL 300 MG/ML  SOLN  Comparison:   None  Findings: No focal mucosal or submucosal lesions are present.  The vocal cords are midline and symmetric.  Asymmetric enlarged left level II lymph nodes are present.  The largest node measures 12 x 21 x 8 mm.  Borderline right-sided level II lymph nodes are smaller.  No primary mass lesion is identified.  The bone windows are unremarkable.  The lung apices are clear.  IMPRESSION:  1.  Bilateral level II lymphadenopathy is more prominent on the left.  This likely represents primary adenopathy of HIV. 2.  No focal mucosal or submucosal lesions are evident. 3.  No other discrete mass or abscess.   Original Report Authenticated By: Marin Roberts, M.D.   Ct Soft Tissue Neck W Contrast  03/16/2013   *RADIOLOGY REPORT*   Clinical Data:  HIV/pains.  Left-sided neck pain.  Fever. Dizziness.  Rule out abscess.  CT HEAD WITHOUT AND WITH CONTRAST  Technique: Contiguous axial images were obtained from the base of the skull through the vertex without and with intravenous contrast  Contrast: 75mL OMNIPAQUE IOHEXOL 300 MG/ML  SOLN  Comparison:   None.  Findings:  No acute cortical infarct, hemorrhage, mass lesion is present.  The ventricles are of normal size.  No significant extra- axial fluid collection is present.  The paranasal sinuses and mastoid air cells are clear.  The osseous skull is intact.  IMPRESSION: Negative CT of the head without and with contrast.  *RADIOLOGY REPORT*  CT NECK WITH CONTRAST  Technique:  Multidetector CT imaging of the neck was performed using the standard protocol following the bolus administration of intravenous contrast.  Contrast: 75mL OMNIPAQUE IOHEXOL 300 MG/ML  SOLN  Comparison:   None  Findings: No focal mucosal or submucosal lesions are present.  The vocal cords are midline and symmetric.  Asymmetric enlarged left level II lymph nodes are present.  The largest node measures 12 x 21 x 8 mm.  Borderline right-sided level II lymph nodes are smaller.  No primary mass lesion is identified.  The bone windows are unremarkable.  The lung apices are clear.  IMPRESSION:  1.  Bilateral level II lymphadenopathy is more prominent on the left.  This likely represents primary adenopathy of HIV. 2.  No focal mucosal or submucosal lesions are evident. 3.  No other discrete mass or abscess.   Original Report Authenticated By: Marin Roberts, M.D.    Microbiology: No results found for this or any previous visit (from the past 240 hour(s)).   Labs: Basic Metabolic Panel:  Recent Labs Lab 03/15/13 1521  NA 135  K 4.0  CL 100  CO2 28  GLUCOSE 98  BUN 16  CREATININE 0.64  CALCIUM 8.4   Liver Function Tests:  Recent Labs Lab 03/15/13 1521  AST 35  ALT 20  ALKPHOS 89  BILITOT 0.2*  PROT  10.4*  ALBUMIN 3.2*   No results found for this basename: LIPASE, AMYLASE,  in the last 168 hours No results found for this basename: AMMONIA,  in the last 168 hours CBC:  Recent Labs Lab 03/15/13 1521 03/17/13 0650  WBC 4.0 7.5  NEUTROABS 1.5*  --   HGB 10.8* 8.6*  HCT 32.4* 26.4*  MCV 90.0 84.6  PLT 145* 199   Cardiac Enzymes: No results found for this basename: CKTOTAL, CKMB, CKMBINDEX, TROPONINI,  in the last 168 hours BNP: BNP (last 3 results) No results found for this basename: PROBNP,  in the last 8760 hours CBG:  Recent Labs Lab 03/16/13 0731 03/18/13 2157  GLUCAP 80 97       Signed:  Roniesha Hollingshead A  [facility] 03/19/2013, 11:30 AM

## 2013-03-19 NOTE — Progress Notes (Signed)
   CARE MANAGEMENT NOTE 03/19/2013  Patient:  Tamara Briggs, Tamara Briggs   Account Number:  [id number]  Date Initiated:  03/16/2013  Documentation initiated by:  Letha Cape  Subjective/Objective Assessment:   dx acute otitis media, o42,  admit-lives with mom.  pta indep.     Action/Plan:   Anticipated DC Date:  03/18/2013   Anticipated DC Plan:  HOME/SELF CARE  In-house referral  Clinical Social Worker      DC Associate Professor  CM consult  Medication Assistance  MATCH Program      Choice offered to / List presented to:             Status of service:  Completed, signed off Medicare Important Message given?   (If response is "NO", the following Medicare IM given date fields will be blank) Date Medicare IM given:   Date Additional Medicare IM given:    Discharge Disposition:  HOME/SELF CARE  Per UR Regulation:  Reviewed for med. necessity/level of care/duration of stay  If discussed at Long Length of Stay Meetings, dates discussed:    Comments:  03/19/2013 1400 Reviewed MATCH program with pt and steps to taking Rx to pharmacy with letter. Provided bus pass. Pt states she does have $3.00 for her medications.  Isidoro Donning RN CCM Case Mgmt phone [phone]  03/17/13 11;38 Deboah Ladona Ridgel RN, BSN [phone] Patient is showing today as medicaid potential, called Artist, she stated patient 's medicaid is showing inactive since 01/20/13. Patient will need to reapply for Medicaid.    Patient only had Family Planning Medicaid which does not cover any hospital expenses etc. Patient informed CSW that she gets her pysch meds from [facility], patient is also with the ID clinic where she should be getting her HAART meds from.  Patient will probably need medication help for any other meds prescrbed by MD.  Patient wanted to use the Match program for her abx,  I gave patient the Match Letter  to ast her in getting her abx which will not be on the $4 list.  03/16/13 Letha Cape RN, BSN [phone] patient lives with mom. pta indep.  Patient states she has medicaid for medications.

## 2013-03-23 ENCOUNTER — Ambulatory Visit (INDEPENDENT_AMBULATORY_CARE_PROVIDER_SITE_OTHER): Payer: Medicaid Other | Admitting: Internal Medicine

## 2013-03-23 ENCOUNTER — Encounter: Payer: Self-pay | Admitting: Internal Medicine

## 2013-03-23 VITALS — BP 105/70 | HR 111 | Temp 98.2°F | Wt 83.0 lb

## 2013-03-23 DIAGNOSIS — N898 Other specified noninflammatory disorders of vagina: Secondary | ICD-10-CM

## 2013-03-23 NOTE — Progress Notes (Signed)
[facility] NOTE  RFV: hospital follow up Subjective:    Patient ID: Tamara Briggs, female    DOB: [birth year], 27 y.o.   MRN: [id number]  HPI 27yo F with HIV/AIDS, cD 4 count 50/VL 1.55M c/b wasting, bipolar disease, RF of CSW, on truvada/tivicay plus azithromycin qwk, bactrim, fluconazole for OI proph seen last week for AOM, uri admitted briefly to the hospital. She is here in follow up. Feels fatigue, poorly sleep. Little to eat this morning. Took latuda, feels solomnent. Reports having+ vaginal discharge.   Introduced the idea of going to [facility] for recuperate which she is amenable.  Current Outpatient Prescriptions on File Prior to Visit  Medication Sig Dispense Refill  . azithromycin (ZITHROMAX) 600 MG tablet Take 2 tablets (1,200 mg total) by mouth every 7 (seven) days.  8 tablet  3  . ciprofloxacin-hydrocortisone (CIPRO HC OTIC) otic suspension Place 3 drops into the left ear 2 (two) times daily.  10 mL  0  . dolutegravir (TIVICAY) 50 MG tablet Take 1 tablet (50 mg total) by mouth daily.  30 tablet  11  . doxycycline (VIBRA-TABS) 100 MG tablet Take 1 tablet (100 mg total) by mouth every 12 (twelve) hours.  10 tablet  0  . dronabinol (MARINOL) 5 MG capsule Take 1 capsule (5 mg total) by mouth 2 (two) times daily before a meal.  60 capsule  3  . emtricitabine-tenofovir (TRUVADA) 200-300 MG per tablet Take 1 tablet by mouth daily.  30 tablet  11  . ENSURE (ENSURE) Take 237 mLs by mouth 2 (two) times daily between meals. Dispense 1-2  case per month; for severe protein-malnutrition, BMI 13  237 mL  11  . fluconazole (DIFLUCAN) 200 MG tablet Take 1 tablet (200 mg total) by mouth daily.  30 tablet  1  . lurasidone (LATUDA) 40 MG TABS Take 40 mg by mouth at bedtime.       . sertraline (ZOLOFT) 50 MG tablet Take 50 mg by mouth daily.      Marland Kitchen sulfamethoxazole-trimethoprim (BACTRIM) 400-80 MG per tablet Take 1 tablet by mouth daily.  30 tablet  11   No current facility-administered  medications on file prior to visit.   Active Ambulatory Problems    Diagnosis Date Noted  . HIV (human immunodeficiency virus infection) 07/27/2011  . Chlamydia 07/27/2011  . H/O abnormal cervical Papanicolaou smear 07/26/2008  . Acute otitis externa of left ear 03/15/2013  . acute otitis media 03/15/2013  . Tobacco abuse 03/15/2013  . h/o crack Cocaine abuse 03/15/2013  . h/o oral Thrush 03/15/2013  . Gonorrhea 03/15/2013  . Acute sinusitis 03/15/2013  . Protein-calorie malnutrition, severe 03/16/2013   Resolved Ambulatory Problems    Diagnosis Date Noted  . Underweight 07/27/2011  . Unspecified protein-calorie malnutrition 03/15/2013   Past Medical History  Diagnosis Date  . Crack cocaine use   . Anxiety   . Depression   . Schizophrenia   . Bipolar disorder   . 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 Otitis media of left ear 03/15/2013     Review of Systems     Objective:   Physical Exam BP 105/70  Pulse 111  Temp(Src) 98.2 F (36.8 C) (Oral)  Wt 83 lb (37.649 kg)  BMI 14.24 kg/m2  LMP 03/06/2013 Physical Exam  Constitutional: sleepy, cachetic, arousable to answering questions HENT:  Mouth/Throat: Oropharynx is clear and moist. No oropharyngeal exudate. Left ear not tender  Cardiovascular: Normal rate, regular rhythm and normal heart sounds.  Exam reveals no gallop and no friction rub.  No murmur heard.  Pulmonary/Chest: Effort normal and breath sounds normal. No respiratory distress. He has no wheezes.  Abdominal: Soft. Bowel sounds are normal. scaphoid There is no tenderness.  Lymphadenopathy:  no cervical adenopathy.  Skin: dry skin Psychiatric:  a normal mood and affect.         Assessment & Plan:  Vaginal discharge/dysura = will orderUA, urine culture, gc and chlam  HIV= continue with meds, will check labs next week at 4 wk mark to see how she is doing. She is also open to going to [facility]. Will place referral  AOM = continue with antibiotics  rtc in 1 wk  for labs, 2wk for visit. Need to make referral to [facility]

## 2013-03-24 ENCOUNTER — Other Ambulatory Visit (INDEPENDENT_AMBULATORY_CARE_PROVIDER_SITE_OTHER): Payer: Medicaid Other

## 2013-03-24 ENCOUNTER — Other Ambulatory Visit (HOSPITAL_COMMUNITY)
Admission: RE | Admit: 2013-03-24 | Discharge: 2013-03-24 | Disposition: A | Discharge status: Home / Self Care | Payer: Self-pay | Source: Ambulatory Visit | Attending: Internal Medicine | Admitting: Internal Medicine

## 2013-03-24 DIAGNOSIS — N898 Other specified noninflammatory disorders of vagina: Secondary | ICD-10-CM

## 2013-03-24 DIAGNOSIS — Z113 Encounter for screening for infections with a predominantly sexual mode of transmission: Secondary | ICD-10-CM | POA: Insufficient documentation

## 2013-03-25 LAB — URINALYSIS, ROUTINE W REFLEX MICROSCOPIC
Ketones, ur: NEGATIVE mg/dL
Nitrite: NEGATIVE
Protein, ur: NEGATIVE mg/dL
Urobilinogen, UA: 0.2 mg/dL (ref 0.0–1.0)
pH: 6.5 (ref 5.0–8.0)

## 2013-03-25 LAB — URINALYSIS, MICROSCOPIC ONLY
Bacteria, UA: NONE SEEN
Casts: NONE SEEN

## 2013-03-26 LAB — URINE CULTURE
Colony Count: NO GROWTH
Organism ID, Bacteria: NO GROWTH

## 2013-03-28 ENCOUNTER — Other Ambulatory Visit (INDEPENDENT_AMBULATORY_CARE_PROVIDER_SITE_OTHER): Payer: Self-pay

## 2013-03-28 ENCOUNTER — Other Ambulatory Visit: Payer: Self-pay | Admitting: *Deleted

## 2013-03-28 DIAGNOSIS — B20 Human immunodeficiency virus [HIV] disease: Secondary | ICD-10-CM

## 2013-03-29 LAB — T-HELPER CELL (CD4) - (RCID CLINIC ONLY): CD4 % Helper T Cell: 33 % (ref 33–55)

## 2013-03-30 ENCOUNTER — Ambulatory Visit: Payer: Self-pay | Admitting: Internal Medicine

## 2013-03-30 LAB — HIV-1 RNA QUANT-NO REFLEX-BLD
HIV 1 RNA Quant: 901 copies/mL — ABNORMAL HIGH (ref <20)
HIV-1 RNA Quant, Log: 2.95 {Log} — ABNORMAL HIGH (ref <1.30)

## 2013-04-03 ENCOUNTER — Emergency Department (HOSPITAL_COMMUNITY)
Admission: EM | Admit: 2013-04-03 | Discharge: 2013-04-03 | Disposition: A | Discharge status: Home / Self Care | Payer: Self-pay | Attending: Emergency Medicine | Admitting: Emergency Medicine

## 2013-04-03 ENCOUNTER — Encounter (HOSPITAL_COMMUNITY): Payer: Self-pay | Admitting: *Deleted

## 2013-04-03 DIAGNOSIS — F17200 Nicotine dependence, unspecified, uncomplicated: Secondary | ICD-10-CM | POA: Insufficient documentation

## 2013-04-03 DIAGNOSIS — K0889 Other specified disorders of teeth and supporting structures: Secondary | ICD-10-CM | POA: Insufficient documentation

## 2013-04-03 DIAGNOSIS — Z79899 Other long term (current) drug therapy: Secondary | ICD-10-CM | POA: Insufficient documentation

## 2013-04-03 DIAGNOSIS — Z792 Long term (current) use of antibiotics: Secondary | ICD-10-CM | POA: Insufficient documentation

## 2013-04-03 DIAGNOSIS — R51 Headache: Secondary | ICD-10-CM | POA: Insufficient documentation

## 2013-04-03 DIAGNOSIS — K089 Disorder of teeth and supporting structures, unspecified: Secondary | ICD-10-CM | POA: Insufficient documentation

## 2013-04-03 DIAGNOSIS — F209 Schizophrenia, unspecified: Secondary | ICD-10-CM | POA: Insufficient documentation

## 2013-04-03 DIAGNOSIS — Z3202 Encounter for pregnancy test, result negative: Secondary | ICD-10-CM | POA: Insufficient documentation

## 2013-04-03 DIAGNOSIS — K0510 Chronic gingivitis, plaque induced: Secondary | ICD-10-CM

## 2013-04-03 DIAGNOSIS — R110 Nausea: Secondary | ICD-10-CM | POA: Insufficient documentation

## 2013-04-03 DIAGNOSIS — R197 Diarrhea, unspecified: Secondary | ICD-10-CM | POA: Insufficient documentation

## 2013-04-03 DIAGNOSIS — K0500 Acute gingivitis, plaque induced: Secondary | ICD-10-CM | POA: Insufficient documentation

## 2013-04-03 DIAGNOSIS — F411 Generalized anxiety disorder: Secondary | ICD-10-CM | POA: Insufficient documentation

## 2013-04-03 DIAGNOSIS — F1410 Cocaine abuse, uncomplicated: Secondary | ICD-10-CM | POA: Insufficient documentation

## 2013-04-03 DIAGNOSIS — Z21 Asymptomatic human immunodeficiency virus [HIV] infection status: Secondary | ICD-10-CM | POA: Insufficient documentation

## 2013-04-03 DIAGNOSIS — F319 Bipolar disorder, unspecified: Secondary | ICD-10-CM | POA: Insufficient documentation

## 2013-04-03 LAB — BASIC METABOLIC PANEL
CO2: 23 mEq/L (ref 19–32)
Calcium: 8.2 mg/dL — ABNORMAL LOW (ref 8.4–10.5)
Creatinine, Ser: 0.67 mg/dL (ref 0.50–1.10)
GFR calc non Af Amer: >90 mL/min (ref >90)
Glucose, Bld: 108 mg/dL — ABNORMAL HIGH (ref 70–99)

## 2013-04-03 MED ORDER — OXYCODONE-ACETAMINOPHEN 5-325 MG PO TABS
1.0000 | ORAL_TABLET | Freq: Once | ORAL | Status: AC
  Administered 2013-04-03: 1 via ORAL
  Filled 2013-04-03: qty 1

## 2013-04-03 MED ORDER — SODIUM CHLORIDE 0.9 % IV BOLUS (SEPSIS)
1000.0000 mL | Freq: Once | INTRAVENOUS | Status: AC
  Administered 2013-04-03: 1000 mL via INTRAVENOUS

## 2013-04-03 MED ORDER — OXYCODONE-ACETAMINOPHEN 5-325 MG PO TABS: 1.0000 | ORAL_TABLET | ORAL | Status: DC | PRN

## 2013-04-03 MED ORDER — AMOXICILLIN-POT CLAVULANATE 875-125 MG PO TABS: 1.0000 | ORAL_TABLET | Freq: Two times a day (BID) | ORAL | Status: DC

## 2013-04-03 MED ORDER — ONDANSETRON HCL 4 MG/2ML IJ SOLN
4.0000 mg | Freq: Once | INTRAMUSCULAR | Status: AC
  Administered 2013-04-03: 4 mg via INTRAVENOUS
  Filled 2013-04-03: qty 2

## 2013-04-03 NOTE — ED Notes (Signed)
Pt presents with multiple complaints.  Dental pain to lower 2 front teeth that she has tried to pull out herself, but has been uanble.  Also would like a pregnancy test b/c she is experiencing painful breasts, nausea and diarrhea x 2 days.  LMP 03/15/13.

## 2013-04-03 NOTE — ED Notes (Signed)
Pt here for abd pain, and dental pain, pt with visible caries of teeth , reports nausea. Pleasant and cooperative on assessment.

## 2013-04-03 NOTE — ED Notes (Signed)
[facility] tech called for CMP blood sample. Lab says they do not have sample. Tech says it has been sent to lab.

## 2013-04-03 NOTE — ED Provider Notes (Signed)
CSN: [id number]     Arrival date & time 04/03/13  1047 History     First MD Initiated Contact with Patient 04/03/13 1100     Chief Complaint  Patient presents with  . Dental Pain  . Diarrhea   (Consider location/radiation/quality/duration/timing/severity/associated sxs/prior Treatment) HPI Comments: 27 year old female presents with dental pain for 2-3 weeks. Teeth #24 and 25 have been loose since being 3 weeks ago she's been trying to pull them out because they hurt. This also now her gums hurt. The pain has become so bad that she feels like she's getting a typical migraine headache as well. Denies any fevers or chills or trouble swallowing. She also presents because she's been having some nausea over the past 3 days without vomiting. She has not had any chest or abdominal symptoms otherwise. He has not missed any periods or had any urinary symptoms. She has had several loose stools since last night there have been watery without blood or mucus.  The history is provided by the patient.    Past Medical History  Diagnosis Date  . Crack cocaine use   . Anxiety   . Depression   . Schizophrenia   . Bipolar disorder   . 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 Otitis media of left ear 03/15/2013   Past Surgical History  Procedure Laterality Date  . Colposcopy  2009   No family history on file. History  Substance Use Topics  . Smoking status: Current Every Day Smoker -- 0.50 packs/day for 3 years    Types: Cigarettes  . Smokeless tobacco: Never Used     Comment: cutting back  . Alcohol Use: No   OB History   Grav Para Term Preterm Abortions TAB SAB Ect Mult Living   2 1 1  0 1 0 1 0 0 1     Review of Systems  Constitutional: Negative for fever and chills.  HENT: Positive for dental problem. Negative for trouble swallowing, neck pain and voice change.   Respiratory: Negative for shortness of breath.   Gastrointestinal: Positive for nausea and diarrhea. Negative for vomiting, abdominal pain and  blood in stool.  Genitourinary: Negative for dysuria, vaginal bleeding and menstrual problem.  Musculoskeletal: Negative for back pain.  Neurological: Positive for headaches. Negative for weakness and numbness.  All other systems reviewed and are negative.    Allergies  Review of patient's allergies indicates no known allergies.  Home Medications   Current Outpatient Rx  Name  Route  Sig  Dispense  Refill  . azithromycin (ZITHROMAX) 600 MG tablet   Oral   Take 2 tablets (1,200 mg total) by mouth every 7 (seven) days.   8 tablet   3   . ciprofloxacin-hydrocortisone (CIPRO HC OTIC) otic suspension   Left Ear   Place 3 drops into the left ear 2 (two) times daily.   10 mL   0   . dolutegravir (TIVICAY) 50 MG tablet   Oral   Take 1 tablet (50 mg total) by mouth daily.   30 tablet   11   . doxycycline (VIBRA-TABS) 100 MG tablet   Oral   Take 1 tablet (100 mg total) by mouth every 12 (twelve) hours.   10 tablet   0   . dronabinol (MARINOL) 5 MG capsule   Oral   Take 1 capsule (5 mg total) by mouth 2 (two) times daily before a meal.   60 capsule   3   . emtricitabine-tenofovir (TRUVADA) 200-300 MG  per tablet   Oral   Take 1 tablet by mouth daily.   30 tablet   11   . ENSURE (ENSURE)   Oral   Take 237 mLs by mouth 2 (two) times daily between meals. Dispense 1-2  case per month; for severe protein-malnutrition, BMI 13   237 mL   11   . fluconazole (DIFLUCAN) 200 MG tablet   Oral   Take 1 tablet (200 mg total) by mouth daily.   30 tablet   1   . lurasidone (LATUDA) 40 MG TABS   Oral   Take 40 mg by mouth at bedtime.          . sertraline (ZOLOFT) 50 MG tablet   Oral   Take 50 mg by mouth daily.         Marland Kitchen sulfamethoxazole-trimethoprim (BACTRIM) 400-80 MG per tablet   Oral   Take 1 tablet by mouth daily.   30 tablet   11    BP 119/72  Pulse 112  Temp(Src) 98.5 F (36.9 C) (Oral)  Resp 22  SpO2 97%  LMP 03/06/2013 Physical Exam  Nursing note  and vitals reviewed. Constitutional: She is oriented to person, place, and time.  Very thin  HENT:  Head: Normocephalic and atraumatic.  Right Ear: External ear normal.  Left Ear: External ear normal.  Nose: Nose normal.  Mouth/Throat: Uvula is midline. Abnormal dentition. Dental caries present. No dental abscesses or edematous.    Eyes: Pupils are equal, round, and reactive to light. Right eye exhibits no discharge. Left eye exhibits no discharge.  Neck: Normal range of motion.  Cardiovascular: Normal rate, regular rhythm and normal heart sounds.   Pulmonary/Chest: Effort normal and breath sounds normal.  Abdominal: Soft. There is no tenderness.  Neurological: She is alert and oriented to person, place, and time. She has normal strength. No sensory deficit. She exhibits normal muscle tone. Gait normal. GCS eye subscore is 4. GCS verbal subscore is 5. GCS motor subscore is 6.  Reflex Scores:      Bicep reflexes are 2+ on the right side and 2+ on the left side.      Patellar reflexes are 2+ on the right side and 2+ on the left side. Skin: Skin is warm and dry.    ED Course   Procedures (including critical care time)  Labs Reviewed  BASIC METABOLIC PANEL - Abnormal; Notable for the following:    Potassium 3.4 (*)    Glucose, Bld 108 (*)    Calcium 8.2 (*)    All other components within normal limits  POCT PREGNANCY, URINE   No results found. 1. Pain, dental   2. Gingivitis   3. Diarrhea     MDM  27 year old female with history of HIV with multiple complaints. Has evidence of acute gingivitis without any signs of dental or apical abscess. No signs of Ludwig angina. Will prescribe Augmentin for this as well as oral pain control. She's also been describing watery diarrhea for 24 hours. Glycolate checked which show mild hypokalemia, will recommend dietary replacement. Otherwise her exam is benign encourage very close dental followup as was return precautions.  Audree Camel,  MD 04/03/13 [phone]

## 2013-04-06 ENCOUNTER — Ambulatory Visit (INDEPENDENT_AMBULATORY_CARE_PROVIDER_SITE_OTHER): Payer: Self-pay | Admitting: Internal Medicine

## 2013-04-06 ENCOUNTER — Encounter: Payer: Self-pay | Admitting: Internal Medicine

## 2013-04-06 VITALS — BP 114/65 | HR 110 | Temp 98.0°F | Wt 92.0 lb

## 2013-04-06 DIAGNOSIS — Z113 Encounter for screening for infections with a predominantly sexual mode of transmission: Secondary | ICD-10-CM

## 2013-04-06 DIAGNOSIS — A749 Chlamydial infection, unspecified: Secondary | ICD-10-CM

## 2013-04-06 NOTE — Progress Notes (Signed)
[facility] NOTE  RFV: routine Subjective:    Patient ID: Tamara Briggs, female    DOB: 1986/02/01, 27 y.o.   MRN: [id number]  HPI 27yo F with HIV/AIDS, cD 4 count 50/VL 1.63M  In July 2014 c/b wasting, bipolar disease, HIV RF of CSW, on truvada/tivicay plus azithromycin qwk, bactrim, fluconazole for OI proph , repeat labs after ART for 4-6 wks now show CD 4 count of 480/VL 901. She reports taking her medications regularly. She has gained 10 lbs since we last saw her 2-3 wks ago.She is feeling better since taking her meds. She reports not taking her antibiotics from her last hospitalization. She is improved with her complaints of ear pain. No fever, chills, but has nightsweats. She has good appetite with appetite stimulant. She is feeling like she has more energy. Denies left ear pain from recent AOM but is using qtips to clean out her ear. She reports that she was taking azithro 600mg  qday instead of 2 tabs per week by accident.  Current Outpatient Prescriptions on File Prior to Visit  Medication Sig Dispense Refill  . azithromycin (ZITHROMAX) 600 MG tablet Take 2 tablets (1,200 mg total) by mouth every 7 (seven) days.  8 tablet  3  . dolutegravir (TIVICAY) 50 MG tablet Take 1 tablet (50 mg total) by mouth daily.  30 tablet  11  . dronabinol (MARINOL) 5 MG capsule Take 1 capsule (5 mg total) by mouth 2 (two) times daily before a meal.  60 capsule  3  . emtricitabine-tenofovir (TRUVADA) 200-300 MG per tablet Take 1 tablet by mouth daily.  30 tablet  11  . ENSURE (ENSURE) Take 237 mLs by mouth 2 (two) times daily between meals. Dispense 1-2  case per month; for severe protein-malnutrition, BMI 13  237 mL  11  . fluconazole (DIFLUCAN) 200 MG tablet Take 1 tablet (200 mg total) by mouth daily.  30 tablet  1  . lurasidone (LATUDA) 40 MG TABS Take 40 mg by mouth at bedtime.       . sertraline (ZOLOFT) 50 MG tablet Take 50 mg by mouth daily.      Marland Kitchen sulfamethoxazole-trimethoprim (BACTRIM) 400-80 MG per  tablet Take 1 tablet by mouth daily.  30 tablet  11   No current facility-administered medications on file prior to visit.   Active Ambulatory Problems    Diagnosis Date Noted  . HIV (human immunodeficiency virus infection) 07/27/2011  . Chlamydia 07/27/2011  . H/O abnormal cervical Papanicolaou smear 07/26/2008  . Acute otitis externa of left ear 03/15/2013  . acute otitis media 03/15/2013  . Tobacco abuse 03/15/2013  . h/o crack Cocaine abuse 03/15/2013  . h/o oral Thrush 03/15/2013  . Gonorrhea 03/15/2013  . Acute sinusitis 03/15/2013  . Protein-calorie malnutrition, severe 03/16/2013   Resolved Ambulatory Problems    Diagnosis Date Noted  . Underweight 07/27/2011  . Unspecified protein-calorie malnutrition 03/15/2013   Past Medical History  Diagnosis Date  . Crack cocaine use   . Anxiety   . Depression   . Schizophrenia   . Bipolar disorder   . 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 Otitis media of left ear 03/15/2013      Review of Systems + nightsweats + weight gain. 12 point ROS is otherwise negative    Objective:   Physical Exam BP 114/65  Pulse 110  Temp(Src) 98 F (36.7 C) (Oral)  Wt 92 lb (41.731 kg)  BMI 15.78 kg/m2  LMP 03/06/2013 Physical Exam  Constitutional:  oriented to person, place, and time.  appears well-developed and well-nourished. No distress.  HENT: no thrush, but poor dentition. R TM is clear L TM has mild erythema in external canal. Mouth/Throat: Oropharynx is clear and moist. No oropharyngeal exudate.  Cardiovascular: Normal rate, regular rhythm and normal heart sounds. Exam reveals no gallop and no friction rub.  No murmur heard.  Pulmonary/Chest: Effort normal and breath sounds normal. No respiratory distress.  no wheezes.  Abdominal: Soft. Bowel sounds are normal. exhibits no distension. There is no tenderness.  Lymphadenopathy: no cervical adenopathy.  Neurological:  alert and oriented to person, place, and time.  Skin: Skin is warm and dry. No  rash noted. No erythema.  Psychiatric:  a normal mood and affect. behavior is normal.      Assessment & Plan:   HIV = doing excellent job with adherence for the last month. She has brisk immune and virologic response, thus far no signs of IRIS. Will continue to have her come back in 2 wks to check on adherence and any other symptoms concerning for IRIS.  Wasting/ severe protein-caloric malnutrition = improving with recent 10 lb weight gain. Will continue with marinol and food supplementation. She is also willing to go to [facility] to help with med management and nutritional support.  Hx of chlamydia = unclear if she had finished treatment completely, although did take azithromycin. She reports having sex with regular "friend". We will check ur for chlamydia for TOC.  oi proph = continue with bactrim, and fluconazole and azithromycin

## 2013-04-09 ENCOUNTER — Telehealth (HOSPITAL_COMMUNITY): Payer: Self-pay | Admitting: Emergency Medicine

## 2013-04-25 ENCOUNTER — Encounter (HOSPITAL_COMMUNITY): Payer: Self-pay | Admitting: Family

## 2013-04-25 ENCOUNTER — Inpatient Hospital Stay (HOSPITAL_COMMUNITY)
Admission: AD | Admit: 2013-04-25 | Discharge: 2013-04-25 | Disposition: A | Discharge status: Home / Self Care | Payer: Self-pay | Source: Ambulatory Visit | Attending: Obstetrics & Gynecology | Admitting: Obstetrics & Gynecology

## 2013-04-25 DIAGNOSIS — N912 Amenorrhea, unspecified: Secondary | ICD-10-CM

## 2013-04-25 DIAGNOSIS — Z3202 Encounter for pregnancy test, result negative: Secondary | ICD-10-CM | POA: Insufficient documentation

## 2013-04-25 LAB — POCT PREGNANCY, URINE: Preg Test, Ur: NEGATIVE

## 2013-04-25 NOTE — MAU Provider Note (Signed)
  History     CSN: [id number]  Arrival date and time: 04/25/13 6213   None     Chief Complaint  Patient presents with  . Possible Pregnancy   HPI  Pt is a G2P1011 here non-pregnant here for pregnancy test.  LMP 03/10/13.  No report of pelvic pain or bleeding.  Desires pregnancy.  Past Medical History  Diagnosis Date  . Crack cocaine use   . Anxiety   . Depression   . Schizophrenia   . Bipolar disorder   . 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 Otitis media of left ear 03/15/2013    Past Surgical History  Procedure Laterality Date  . Colposcopy  2009    No family history on file.  History  Substance Use Topics  . Smoking status: Current Every Day Smoker -- 0.50 packs/day for 3 years    Types: Cigarettes  . Smokeless tobacco: Never Used     Comment: cutting back  . Alcohol Use: No    Allergies: No Known Allergies  Prescriptions prior to admission  Medication Sig Dispense Refill  . azithromycin (ZITHROMAX) 600 MG tablet Take 2 tablets (1,200 mg total) by mouth every 7 (seven) days.  8 tablet  3  . dolutegravir (TIVICAY) 50 MG tablet Take 1 tablet (50 mg total) by mouth daily.  30 tablet  11  . dronabinol (MARINOL) 5 MG capsule Take 1 capsule (5 mg total) by mouth 2 (two) times daily before a meal.  60 capsule  3  . emtricitabine-tenofovir (TRUVADA) 200-300 MG per tablet Take 1 tablet by mouth daily.  30 tablet  11  . ENSURE (ENSURE) Take 237 mLs by mouth 2 (two) times daily between meals. Dispense 1-2  case per month; for severe protein-malnutrition, BMI 13  237 mL  11  . lurasidone (LATUDA) 40 MG TABS Take 40 mg by mouth at bedtime.       . sertraline (ZOLOFT) 50 MG tablet Take 50 mg by mouth daily.      Marland Kitchen sulfamethoxazole-trimethoprim (BACTRIM) 400-80 MG per tablet Take 1 tablet by mouth daily.  30 tablet  11    ROS Pertinent info in HPI  Physical Exam   There were no vitals taken for this visit.  Physical Exam  Constitutional: She is oriented to person, place, and time.  She appears well-developed and well-nourished. No distress.  HENT:  Head: Normocephalic.  Neck: Normal range of motion. Neck supple.  Neurological: She is alert and oriented to person, place, and time. She has normal reflexes.  Skin: Skin is warm and dry.    MAU Course  Procedures  Results for orders placed during the hospital encounter of 04/25/13 (from the past 24 hour(s))  POCT PREGNANCY, URINE     Status: None   Collection Time    04/25/13 10:02 AM      Result Value Range   Preg Test, Ur NEGATIVE  NEGATIVE     Assessment and Plan  Pregnancy Test - Negative  Plan: Provided results. Follow-up prn.  [facility] 04/25/2013, 10:11 AM

## 2013-04-25 NOTE — MAU Note (Signed)
Called not in lobby.

## 2013-04-25 NOTE — MAU Provider Note (Signed)
Attestation of Attending Supervision of Advanced Practitioner (CNM/NP): Evaluation and management procedures were performed by the Advanced Practitioner under my supervision and collaboration.  I have reviewed the Advanced Practitioner's note and chart, and I agree with the management and plan.  HARRAWAY-SMITH, Salvatrice Morandi 12:14 PM

## 2013-04-26 ENCOUNTER — Ambulatory Visit: Payer: Self-pay

## 2013-04-27 ENCOUNTER — Encounter: Payer: Self-pay | Admitting: Internal Medicine

## 2013-04-27 ENCOUNTER — Ambulatory Visit (INDEPENDENT_AMBULATORY_CARE_PROVIDER_SITE_OTHER): Payer: Self-pay | Admitting: Internal Medicine

## 2013-04-27 VITALS — BP 101/63 | HR 69 | Temp 98.3°F | Ht 64.0 in | Wt 94.0 lb

## 2013-04-27 DIAGNOSIS — Z23 Encounter for immunization: Secondary | ICD-10-CM

## 2013-04-27 DIAGNOSIS — B20 Human immunodeficiency virus [HIV] disease: Secondary | ICD-10-CM

## 2013-04-27 DIAGNOSIS — Z113 Encounter for screening for infections with a predominantly sexual mode of transmission: Secondary | ICD-10-CM

## 2013-04-27 LAB — CBC WITH DIFFERENTIAL/PLATELET
Basophils Absolute: 0 10*3/uL (ref 0.0–0.1)
Eosinophils Relative: 9 % — ABNORMAL HIGH (ref 0–5)
HCT: 33.1 % — ABNORMAL LOW (ref 36.0–46.0)
Lymphocytes Relative: 39 % (ref 12–46)
Lymphs Abs: 1.4 10*3/uL (ref 0.7–4.0)
MCV: 89.5 fL (ref 78.0–100.0)
Neutro Abs: 1.3 10*3/uL — ABNORMAL LOW (ref 1.7–7.7)
Platelets: 235 10*3/uL (ref 150–400)
RBC: 3.7 MIL/uL — ABNORMAL LOW (ref 3.87–5.11)
RDW: 14.5 % (ref 11.5–15.5)
WBC: 3.6 10*3/uL — ABNORMAL LOW (ref 4.0–10.5)

## 2013-04-27 LAB — COMPLETE METABOLIC PANEL WITH GFR
ALT: 15 U/L (ref 0–35)
AST: 21 U/L (ref 0–37)
Albumin: 3.8 g/dL (ref 3.5–5.2)
CO2: 28 mEq/L (ref 19–32)
Calcium: 9.3 mg/dL (ref 8.4–10.5)
Chloride: 102 mEq/L (ref 96–112)
Creat: 0.7 mg/dL (ref 0.50–1.10)
GFR, Est African American: >89 mL/min
Potassium: 4.6 mEq/L (ref 3.5–5.3)
Total Protein: 8.8 g/dL — ABNORMAL HIGH (ref 6.0–8.3)

## 2013-04-27 NOTE — Progress Notes (Signed)
[facility]  RFV: routine  Subjective:    Patient ID: Tamara Briggs, female    DOB: [birth year], 27 y.o.   MRN: [id number]  HPI 27yo F with CD 4 count 480/VL 901, now at 2 months of taking DLG/truvada. #2 wt gain since 3 wks ago. Eating well. She states that she has occassional ear itchiness to her left ear which was recently treated for otitits externa. She is currently on not on birth control. She usually has period that starts between 3-6th. Usually has 5 days long. Would not want depo or iud at this time.  Current Outpatient Prescriptions on File Prior to Visit  Medication Sig Dispense Refill  . azithromycin (ZITHROMAX) 600 MG tablet Take 2 tablets (1,200 mg total) by mouth every 7 (seven) days.  8 tablet  3  . dolutegravir (TIVICAY) 50 MG tablet Take 1 tablet (50 mg total) by mouth daily.  30 tablet  11  . dronabinol (MARINOL) 5 MG capsule Take 1 capsule (5 mg total) by mouth 2 (two) times daily before a meal.  60 capsule  3  . emtricitabine-tenofovir (TRUVADA) 200-300 MG per tablet Take 1 tablet by mouth daily.  30 tablet  11  . ENSURE (ENSURE) Take 237 mLs by mouth 2 (two) times daily between meals. Dispense 1-2  case per month; for severe protein-malnutrition, BMI 13  237 mL  11  . lurasidone (LATUDA) 40 MG TABS Take 40 mg by mouth at bedtime.       . sertraline (ZOLOFT) 50 MG tablet Take 50 mg by mouth daily.      Marland Kitchen sulfamethoxazole-trimethoprim (BACTRIM) 400-80 MG per tablet Take 1 tablet by mouth daily.  30 tablet  11   No current facility-administered medications on file prior to visit.     Review of Systems     Objective:   Physical Exam BP 101/63  Pulse 69  Temp(Src) 98.3 F (36.8 C) (Oral)  Ht 5\' 4"  (1.626 m)  Wt 94 lb (42.638 kg)  BMI 16.13 kg/m2  LMP 03/10/2013 Physical Exam  Constitutional:  oriented to person, place, and time.  appears well-developed and well-nourished. No distress.  HENT:  Mouth/Throat: Oropharynx is clear and moist. No oropharyngeal  exudate.  Cardiovascular: Normal rate, regular rhythm and normal heart sounds. Exam reveals no gallop and no friction rub. No murmur heard.  Pulmonary/Chest: Effort normal and breath sounds normal. No respiratory distress.  no wheezes.  Abdominal: Soft. Bowel sounds are normal. He exhibits no distension. There is no tenderness.  Lymphadenopathy:  no cervical adenopathy.  Neurological:  alert and oriented to person, place, and time.  Skin: Skin is warm and dry. No rash noted. No erythema.  Psychiatric:  a normal mood and affect.  behavior is normal.       Assessment & Plan:  HIV = will do labs, continue  Malnutrition =continue with marinol  Birth control = will start ocp nad will check urine hcg  Health maintenance = will get flu shot today; will check ur gc and chlam  rtc 4 wk

## 2013-04-28 LAB — T-HELPER CELL (CD4) - (RCID CLINIC ONLY)
CD4 % Helper T Cell: 23 % — ABNORMAL LOW (ref 33–55)
CD4 T Cell Abs: 300 /uL — ABNORMAL LOW (ref 400–2700)

## 2013-05-01 LAB — HIV-1 RNA QUANT-NO REFLEX-BLD: HIV-1 RNA Quant, Log: 2.43 {Log} — ABNORMAL HIGH (ref <1.30)

## 2013-05-11 ENCOUNTER — Ambulatory Visit (INDEPENDENT_AMBULATORY_CARE_PROVIDER_SITE_OTHER): Payer: Self-pay | Admitting: Internal Medicine

## 2013-05-11 ENCOUNTER — Encounter: Payer: Self-pay | Admitting: Internal Medicine

## 2013-05-11 VITALS — BP 90/54 | HR 105 | Temp 98.2°F | Ht 62.25 in | Wt 95.0 lb

## 2013-05-11 DIAGNOSIS — K1120 Sialoadenitis, unspecified: Secondary | ICD-10-CM

## 2013-05-11 DIAGNOSIS — B20 Human immunodeficiency virus [HIV] disease: Secondary | ICD-10-CM

## 2013-05-11 DIAGNOSIS — R21 Rash and other nonspecific skin eruption: Secondary | ICD-10-CM

## 2013-05-11 NOTE — Progress Notes (Signed)
Subjective:    Patient ID: Tamara Briggs, female    DOB: 05-25-1986, 27 y.o.   MRN: [id number]  HPI CD 4 count 300/VL 268 ( nadir of 50/VL 1.21M) has been on DTG/truvada for roughly 2 months and nearly virally suppressed. She presents to clinic as a sick visit for tenderness to right cheek and pruritic rash to chest. She states the rash is not tender but she often itches her skin. Her ongoing otitis externa is improved but has occ. Itching. No longer using ear drops. Right cheek tenderness x 1 day. Has good appetite. She states she is taking all her medications daily (including azithromycin)  She reports feeling depressed QOD while on her current regimen, no SI  Current Outpatient Prescriptions on File Prior to Visit  Medication Sig Dispense Refill  . dolutegravir (TIVICAY) 50 MG tablet Take 1 tablet (50 mg total) by mouth daily.  30 tablet  11  . emtricitabine-tenofovir (TRUVADA) 200-300 MG per tablet Take 1 tablet by mouth daily.  30 tablet  11  . ENSURE (ENSURE) Take 237 mLs by mouth 2 (two) times daily between meals. Dispense 1-2  case per month; for severe protein-malnutrition, BMI 13  237 mL  11  . lurasidone (LATUDA) 40 MG TABS Take 40 mg by mouth at bedtime.       . sertraline (ZOLOFT) 50 MG tablet Take 50 mg by mouth daily.      Marland Kitchen sulfamethoxazole-trimethoprim (BACTRIM) 400-80 MG per tablet Take 1 tablet by mouth daily.  30 tablet  11   No current facility-administered medications on file prior to visit.   Active Ambulatory Problems    Diagnosis Date Noted  . HIV (human immunodeficiency virus infection) 07/27/2011  . Chlamydia 07/27/2011  . H/O abnormal cervical Papanicolaou smear 07/26/2008  . Acute otitis externa of left ear 03/15/2013  . acute otitis media 03/15/2013  . Tobacco abuse 03/15/2013  . h/o crack Cocaine abuse 03/15/2013  . h/o oral Thrush 03/15/2013  . Gonorrhea 03/15/2013  . Acute sinusitis 03/15/2013  . Protein-calorie malnutrition, severe 03/16/2013   Resolved  Ambulatory Problems    Diagnosis Date Noted  . Underweight 07/27/2011  . Unspecified protein-calorie malnutrition 03/15/2013   Past Medical History  Diagnosis Date  . Crack cocaine use   . Anxiety   . Depression   . Schizophrenia   . Bipolar disorder   . 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 Otitis media of left ear 03/15/2013   Soc hx: cocaine use x 1, roughly 1 month ago    Review of Systems     Objective:   Physical Exam BP 90/54  Pulse 105  Temp(Src) 98.2 F (36.8 C) (Oral)  Ht 5' 2.25" (1.581 m)  Wt 95 lb (43.092 kg)  BMI 17.24 kg/m2  LMP 05/09/2013 Physical Exam  Constitutional: oriented to person, place, and time. Chronically ill appearing. No distress.  HENT: poor dentition, does has some swelling to right cheek. No gum line irritation Mouth/Throat: Oropharynx is clear and moist. No oropharyngeal exudate.  Cardiovascular: Normal rate, regular rhythm and normal heart sounds. Exam reveals no gallop and no friction rub.  No murmur heard.  Pulmonary/Chest: Effort normal and breath sounds normal. No respiratory distress.  no wheezes.  Abdominal: Soft. Bowel sounds are normal. He exhibits no distension. There is no tenderness.  Lymphadenopathy:  no cervical adenopathy.  Neurological:alert and oriented to person, place, and time.  Skin: pruritic rash to chest. Excoriation+, no papules. Flat macules, hypopigmented Psychiatric: a normal mood and  affect.  behavior is normal.      Assessment & Plan:  Parotitis = use hot compress; can do ibuprofen. Increase bactrim to twice a day x 7 days  hiv = continue with tivicay/truvada  Rash = will give hydrocortisone cream  oi proph = can stop azithromycin but continue with bactrim  Depression/bipolar = will have her referred to [facility]  rtc in 2 wk

## 2013-05-25 ENCOUNTER — Ambulatory Visit: Payer: Self-pay | Admitting: Internal Medicine

## 2013-05-25 ENCOUNTER — Ambulatory Visit (INDEPENDENT_AMBULATORY_CARE_PROVIDER_SITE_OTHER): Payer: Self-pay | Admitting: Internal Medicine

## 2013-05-25 ENCOUNTER — Encounter: Payer: Self-pay | Admitting: Internal Medicine

## 2013-05-25 VITALS — BP 102/69 | HR 108 | Temp 101.5°F | Wt 94.0 lb

## 2013-05-25 DIAGNOSIS — R509 Fever, unspecified: Secondary | ICD-10-CM

## 2013-05-25 NOTE — Progress Notes (Signed)
[facility]  RFV: follow up for initiation of ART Subjective:    Patient ID: Tamara Briggs, female    DOB: [birth year], 27 y.o.   MRN: [id number]  HPI  27 yo F with HIV, CD 4 count of 300/VL 268 after being on ART for 6 wks.she reports still doing well with taking her meds daily. She states that this morning she felt new onset of fever and dizziness which improved after taking in oral liquids. She states that she has a good appetite, has not had nightsweats.  Current Outpatient Prescriptions on File Prior to Visit  Medication Sig Dispense Refill  . dolutegravir (TIVICAY) 50 MG tablet Take 1 tablet (50 mg total) by mouth daily.  30 tablet  11  . emtricitabine-tenofovir (TRUVADA) 200-300 MG per tablet Take 1 tablet by mouth daily.  30 tablet  11  . ENSURE (ENSURE) Take 237 mLs by mouth 2 (two) times daily between meals. Dispense 1-2  case per month; for severe protein-malnutrition, BMI 13  237 mL  11  . lurasidone (LATUDA) 40 MG TABS Take 40 mg by mouth at bedtime.       . sertraline (ZOLOFT) 50 MG tablet Take 50 mg by mouth daily.      Marland Kitchen sulfamethoxazole-trimethoprim (BACTRIM) 400-80 MG per tablet Take 1 tablet by mouth daily.  30 tablet  11   No current facility-administered medications on file prior to visit.   Active Ambulatory Problems    Diagnosis Date Noted  . HIV (human immunodeficiency virus infection) 07/27/2011  . Chlamydia 07/27/2011  . H/O abnormal cervical Papanicolaou smear 07/26/2008  . Acute otitis externa of left ear 03/15/2013  . acute otitis media 03/15/2013  . Tobacco abuse 03/15/2013  . h/o crack Cocaine abuse 03/15/2013  . h/o oral Thrush 03/15/2013  . Gonorrhea 03/15/2013  . Acute sinusitis 03/15/2013  . Protein-calorie malnutrition, severe 03/16/2013   Resolved Ambulatory Problems    Diagnosis Date Noted  . Underweight 07/27/2011  . Unspecified protein-calorie malnutrition 03/15/2013   Past Medical History  Diagnosis Date  . Crack cocaine use   .  Anxiety   . Depression   . Schizophrenia   . Bipolar disorder   . 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 Otitis media of left ear 03/15/2013      Review of Systems     Objective:   Physical Exam  BP 102/69  Pulse 108  Temp(Src) 101.5 F (38.6 C) (Oral)  Wt 94 lb (42.638 kg)  BMI 17.06 kg/m2  LMP 05/09/2013 Physical Exam  Constitutional: He is oriented to person, place, and time.  appears well-developed and well-nourished. No distress.  HENT:  Mouth/Throat: Oropharynx is clear and moist. No oropharyngeal exudate.  Cardiovascular: Normal rate, regular rhythm and normal heart sounds. Exam reveals no gallop and no friction rub.  No murmur heard.  Pulmonary/Chest: Effort normal and breath sounds normal. No respiratory distress.  no wheezes.  Abdominal: Soft. Bowel sounds are normal. exhibits no distension. There is no tenderness.  Lymphadenopathy:  no cervical adenopathy.  Neurological:  alert and oriented to person, place, and time.  Skin: Skin is warm and dry. No rash noted. No erythema.  Psychiatric:  a normal mood and affect. His behavior is normal.       Assessment & Plan:  Fever = despite vitals, she looks good, non-toxic. No other symptoms other than fever and mild malaise. In ddx does include IRIS. Will get cbc with diff, AFB blood culture, and blood culture. Will  check in early next week to see if symptoms persist. May also need to consider cmv viremia. Will follow up on labs to see if abn.  rtc in 2 wks

## 2013-05-26 LAB — CBC WITH DIFFERENTIAL/PLATELET
Basophils Absolute: 0 K/uL (ref 0.0–0.1)
Basophils Relative: 1 % (ref 0–1)
Eosinophils Absolute: 0 K/uL (ref 0.0–0.7)
Eosinophils Relative: 1 % (ref 0–5)
HCT: 32.5 % — ABNORMAL LOW (ref 36.0–46.0)
Hemoglobin: 11.2 g/dL — ABNORMAL LOW (ref 12.0–15.0)
Lymphocytes Relative: 37 % (ref 12–46)
Lymphs Abs: 0.6 K/uL — ABNORMAL LOW (ref 0.7–4.0)
MCH: 28.7 pg (ref 26.0–34.0)
MCHC: 34.5 g/dL (ref 30.0–36.0)
MCV: 83.3 fL (ref 78.0–100.0)
Monocytes Absolute: 0.4 K/uL (ref 0.1–1.0)
Monocytes Relative: 25 % — ABNORMAL HIGH (ref 3–12)
Neutro Abs: 0.6 K/uL — ABNORMAL LOW (ref 1.7–7.7)
Neutrophils Relative %: 36 % — ABNORMAL LOW (ref 43–77)
Platelets: 186 K/uL (ref 150–400)
RBC: 3.9 MIL/uL (ref 3.87–5.11)
RDW: 12.9 % (ref 11.5–15.5)
WBC: 1.6 K/uL — ABNORMAL LOW (ref 4.0–10.5)

## 2013-05-31 LAB — CULTURE, BLOOD (SINGLE): Organism ID, Bacteria: NO GROWTH

## 2013-06-03 ENCOUNTER — Emergency Department (HOSPITAL_COMMUNITY)
Admission: EM | Admit: 2013-06-03 | Discharge: 2013-06-03 | Disposition: A | Discharge status: Home / Self Care | Payer: Self-pay | Attending: Emergency Medicine | Admitting: Emergency Medicine

## 2013-06-03 ENCOUNTER — Encounter (HOSPITAL_COMMUNITY): Payer: Self-pay | Admitting: Emergency Medicine

## 2013-06-03 DIAGNOSIS — Z79899 Other long term (current) drug therapy: Secondary | ICD-10-CM | POA: Insufficient documentation

## 2013-06-03 DIAGNOSIS — F17200 Nicotine dependence, unspecified, uncomplicated: Secondary | ICD-10-CM | POA: Insufficient documentation

## 2013-06-03 DIAGNOSIS — R1110 Vomiting, unspecified: Secondary | ICD-10-CM | POA: Insufficient documentation

## 2013-06-03 DIAGNOSIS — Z792 Long term (current) use of antibiotics: Secondary | ICD-10-CM | POA: Insufficient documentation

## 2013-06-03 DIAGNOSIS — H6690 Otitis media, unspecified, unspecified ear: Secondary | ICD-10-CM | POA: Insufficient documentation

## 2013-06-03 DIAGNOSIS — Z21 Asymptomatic human immunodeficiency virus [HIV] infection status: Secondary | ICD-10-CM | POA: Insufficient documentation

## 2013-06-03 DIAGNOSIS — F319 Bipolar disorder, unspecified: Secondary | ICD-10-CM | POA: Insufficient documentation

## 2013-06-03 DIAGNOSIS — F411 Generalized anxiety disorder: Secondary | ICD-10-CM | POA: Insufficient documentation

## 2013-06-03 DIAGNOSIS — Z8659 Personal history of other mental and behavioral disorders: Secondary | ICD-10-CM | POA: Insufficient documentation

## 2013-06-03 LAB — CBC WITH DIFFERENTIAL/PLATELET
Eosinophils Absolute: 0.1 10*3/uL (ref 0.0–0.7)
HCT: 34.5 % — ABNORMAL LOW (ref 36.0–46.0)
Hemoglobin: 11.2 g/dL — ABNORMAL LOW (ref 12.0–15.0)
Lymphs Abs: 1.1 10*3/uL (ref 0.7–4.0)
MCH: 27.9 pg (ref 26.0–34.0)
MCHC: 32.5 g/dL (ref 30.0–36.0)
Monocytes Absolute: 0.3 10*3/uL (ref 0.1–1.0)
Monocytes Relative: 12 % (ref 3–12)
Neutro Abs: 1 10*3/uL — ABNORMAL LOW (ref 1.7–7.7)
Neutrophils Relative %: 38 % — ABNORMAL LOW (ref 43–77)
RBC: 4.01 MIL/uL (ref 3.87–5.11)

## 2013-06-03 LAB — COMPREHENSIVE METABOLIC PANEL
Alkaline Phosphatase: 67 U/L (ref 39–117)
BUN: 16 mg/dL (ref 6–23)
Chloride: 98 mEq/L (ref 96–112)
Creatinine, Ser: 0.74 mg/dL (ref 0.50–1.10)
GFR calc Af Amer: >90 mL/min (ref >90)
Glucose, Bld: 88 mg/dL (ref 70–99)
Potassium: 3.3 mEq/L — ABNORMAL LOW (ref 3.5–5.1)
Total Bilirubin: 0.3 mg/dL (ref 0.3–1.2)

## 2013-06-03 MED ORDER — AMOXICILLIN 500 MG PO CAPS: 500.0000 mg | ORAL_CAPSULE | Freq: Two times a day (BID) | ORAL | Status: AC

## 2013-06-03 MED ORDER — ONDANSETRON HCL 4 MG/2ML IJ SOLN
4.0000 mg | Freq: Once | INTRAMUSCULAR | Status: AC
  Administered 2013-06-03: 4 mg via INTRAVENOUS
  Filled 2013-06-03: qty 2

## 2013-06-03 MED ORDER — SODIUM CHLORIDE 0.9 % IV BOLUS (SEPSIS)
1000.0000 mL | Freq: Once | INTRAVENOUS | Status: AC
  Administered 2013-06-03: 1000 mL via INTRAVENOUS

## 2013-06-03 MED ORDER — ONDANSETRON HCL 4 MG PO TABS: 4.0000 mg | ORAL_TABLET | Freq: Four times a day (QID) | ORAL | Status: DC

## 2013-06-03 MED ORDER — IBUPROFEN 400 MG PO TABS
600.0000 mg | ORAL_TABLET | Freq: Once | ORAL | Status: AC
  Administered 2013-06-03: 600 mg via ORAL
  Filled 2013-06-03 (×2): qty 1

## 2013-06-03 MED ORDER — ANTIPYRINE-BENZOCAINE 5.4-1.4 % OT SOLN: 3.0000 [drp] | OTIC | Status: DC | PRN

## 2013-06-03 MED ORDER — AMOXICILLIN 500 MG PO CAPS
500.0000 mg | ORAL_CAPSULE | Freq: Once | ORAL | Status: AC
  Administered 2013-06-03: 500 mg via ORAL
  Filled 2013-06-03: qty 1

## 2013-06-03 NOTE — ED Notes (Signed)
Pt states "I quit crack/cocain yesterday." Pt reports being treated at [facility] in [place].

## 2013-06-03 NOTE — ED Notes (Signed)
Pt here with left ear pain and nausea and vomiting x 2 days.  Pt reporting some abdominal pain.  Denies fevers, chills, sob, cp.

## 2013-06-05 NOTE — ED Provider Notes (Signed)
CSN: [id number]     Arrival date & time 06/03/13  1942 History   First MD Initiated Contact with Patient 06/03/13 2000     Chief Complaint  Patient presents with  . Emesis  . Otalgia   (Consider location/radiation/quality/duration/timing/severity/associated sxs/prior Treatment) HPI Comments: 27 y/o female with history of HIV, schizophrenia presenting with left ear pain x 2 days. Recent admission with otitis media but states that she did not get her prescriptions because she was feeling better. No hearing changes, drainage. Associated with intermittent vomiting that was nonbloody, nonbilious. She recently began HAART therapy this month and followed by ID. Denies fever, abdominal pain, dysuria, vaginal bleeding/discharge, diarrhea.   The history is provided by the patient. No language interpreter was used.    Past Medical History  Diagnosis Date  . Crack cocaine use   . Anxiety   . Depression   . Schizophrenia   . Bipolar disorder   . 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 Otitis media of left ear 03/15/2013   Past Surgical History  Procedure Laterality Date  . Colposcopy  2009   History reviewed. No pertinent family history. History  Substance Use Topics  . Smoking status: Current Every Day Smoker -- 0.50 packs/day for 3 years    Types: Cigarettes  . Smokeless tobacco: Never Used     Comment: cutting back  . Alcohol Use: No   OB History   Grav Para Term Preterm Abortions TAB SAB Ect Mult Living   2 1 1  0 1 0 1 0 0 1     Review of Systems  Constitutional: Negative for fever and activity change.  HENT: Positive for congestion, ear pain and rhinorrhea. Negative for ear discharge, hearing loss, sinus pressure, sore throat and trouble swallowing.   Respiratory: Negative for chest tightness and shortness of breath.   Cardiovascular: Negative for chest pain.  Gastrointestinal: Positive for nausea and vomiting. Negative for abdominal pain, diarrhea, constipation, blood in stool and abdominal  distention.  Genitourinary: Negative for dysuria, hematuria, vaginal bleeding, vaginal discharge and pelvic pain.  All other systems reviewed and are negative.    Allergies  Review of patient's allergies indicates no known allergies.  Home Medications   Current Outpatient Rx  Name  Route  Sig  Dispense  Refill  . dolutegravir (TIVICAY) 50 MG tablet   Oral   Take 1 tablet (50 mg total) by mouth daily.   30 tablet   11   . emtricitabine-tenofovir (TRUVADA) 200-300 MG per tablet   Oral   Take 1 tablet by mouth daily.   30 tablet   11   . ENSURE (ENSURE)   Oral   Take 237 mLs by mouth 2 (two) times daily between meals. Dispense 1-2  case per month; for severe protein-malnutrition, BMI 13   237 mL   11   . lurasidone (LATUDA) 40 MG TABS   Oral   Take 40 mg by mouth daily.          . sertraline (ZOLOFT) 50 MG tablet   Oral   Take 50 mg by mouth daily.         Marland Kitchen sulfamethoxazole-trimethoprim (BACTRIM) 400-80 MG per tablet   Oral   Take 1 tablet by mouth daily.   30 tablet   11   . amoxicillin (AMOXIL) 500 MG capsule   Oral   Take 1 capsule (500 mg total) by mouth 2 (two) times daily.   14 capsule   0   .  antipyrine-benzocaine (AURALGAN) otic solution   Both Ears   Place 3 drops into both ears every 2 (two) hours as needed for pain.   10 mL   0   . ondansetron (ZOFRAN) 4 MG tablet   Oral   Take 1 tablet (4 mg total) by mouth every 6 (six) hours.   12 tablet   0    BP 103/78  Pulse 88  Temp(Src) 97.7 F (36.5 C)  Resp 20  SpO2 100%  LMP 05/09/2013 Physical Exam  Vitals reviewed. Constitutional: She is oriented to person, place, and time. She appears well-developed. No distress.  HENT:  Head: Normocephalic.  Right Ear: Hearing, tympanic membrane, external ear and ear canal normal.  Left Ear: Hearing and ear canal normal. There is tenderness. No drainage. No mastoid tenderness. Tympanic membrane is erythematous. A middle ear effusion is present.   Mouth/Throat: Oropharynx is clear and moist.  Eyes: Conjunctivae are normal. Pupils are equal, round, and reactive to light.  Neck: Neck supple.  Cardiovascular: Normal rate, regular rhythm and normal heart sounds.   Pulmonary/Chest: Effort normal and breath sounds normal.  Abdominal: Soft. Bowel sounds are normal. She exhibits no distension. There is no tenderness. There is no rebound.  Neurological: She is alert and oriented to person, place, and time.  Skin: Skin is warm and dry.  Psychiatric: She has a normal mood and affect. Her behavior is normal. Judgment and thought content normal.    ED Course  Procedures (including critical care time) Labs Review Labs Reviewed  CBC WITH DIFFERENTIAL - Abnormal; Notable for the following:    WBC 2.6 (*)    Hemoglobin 11.2 (*)    HCT 34.5 (*)    Platelets 130 (*)    Neutrophils Relative % 38 (*)    Neutro Abs 1.0 (*)    All other components within normal limits  COMPREHENSIVE METABOLIC PANEL - Abnormal; Notable for the following:    Sodium 133 (*)    Potassium 3.3 (*)    Total Protein 9.0 (*)    AST 132 (*)    ALT 74 (*)    All other components within normal limits   Imaging Review No results found.  EKG Interpretation   None       MDM   1. Otitis media, bilateral    27 y/o female with history of HIV presenting with left otitis media. N/v over the last 2 days. No abdominal pain, fever, dysuria, vaginal bleeding/discharge. No further emesis in ED. Will treat with amoxicillin. CBC, CMP remarkable for mild transminitis likely secondary to starting HAART. Abdominal exam benign, no tenderness. No evidence of systemic illness. Appropriate for discharge with PCP follow up. Return precautions discussed and patient voiced understanding.   Labs and imaging reviewed in my medical decision making if ordered. Patient discussed with my attending, Dr. Ferrel Logan, MD 06/05/13 [phone]

## 2013-06-07 NOTE — ED Provider Notes (Signed)
I saw and evaluated the patient, reviewed the resident's note and I agree with the findings and plan.  Patient presents with complaints of ear pain and examination is consistent with otitis media. Patient treated as an outpatient with antibiotic coverage.  Gilda Crease, MD 06/07/13 1011

## 2013-06-08 ENCOUNTER — Ambulatory Visit: Payer: Self-pay | Admitting: Internal Medicine

## 2013-06-16 ENCOUNTER — Encounter: Payer: Self-pay | Admitting: Internal Medicine

## 2013-06-16 ENCOUNTER — Ambulatory Visit (INDEPENDENT_AMBULATORY_CARE_PROVIDER_SITE_OTHER): Payer: Self-pay | Admitting: Internal Medicine

## 2013-06-16 VITALS — BP 101/64 | HR 85 | Temp 97.6°F | Ht 64.0 in | Wt 96.2 lb

## 2013-06-16 DIAGNOSIS — Z21 Asymptomatic human immunodeficiency virus [HIV] infection status: Secondary | ICD-10-CM

## 2013-06-16 DIAGNOSIS — B20 Human immunodeficiency virus [HIV] disease: Secondary | ICD-10-CM

## 2013-06-16 NOTE — Progress Notes (Signed)
Subjective:    Patient ID: Tamara Briggs, female    DOB: June 07, 1986, 27 y.o.   MRN: [id number]  HPI 27yo F with HIV, c/b wasting, CD 4 count of 300(23%)/VL 268 (sept 2014) after 2 months of being on ART,DLG/truvada.She had rapid response to ART with nadir of VL1.50M(july 2014) and improved quickly. She is also on OI proph with bactrim SS daily. She was seen 3 wks ago where she reported low grade fevers, where I had concerns for IRIS vs. Other causes of febrile illness. AFB cultures still negative at 3 wk mark. She had reported improving appetite and weight gain of a 1lb/wk since her last visit. Went to ED 2 wks ago for ear pain, diagnosed with mild otitis externa. Taking ear drops which is now improved.  Attending [employer] for her high school diploma.  From psych standpoint, she is on zoloft and latuda, followed at [facility], next visit  Is today10/24 at 3pm., which causes sleepiness.  Current Outpatient Prescriptions on File Prior to Visit  Medication Sig Dispense Refill  . antipyrine-benzocaine (AURALGAN) otic solution Place 3 drops into both ears every 2 (two) hours as needed for pain.  10 mL  0  . dolutegravir (TIVICAY) 50 MG tablet Take 1 tablet (50 mg total) by mouth daily.  30 tablet  11  . emtricitabine-tenofovir (TRUVADA) 200-300 MG per tablet Take 1 tablet by mouth daily.  30 tablet  11  . ENSURE (ENSURE) Take 237 mLs by mouth 2 (two) times daily between meals. Dispense 1-2  case per month; for severe protein-malnutrition, BMI 13  237 mL  11  . lurasidone (LATUDA) 40 MG TABS Take 40 mg by mouth daily.       . ondansetron (ZOFRAN) 4 MG tablet Take 1 tablet (4 mg total) by mouth every 6 (six) hours.  12 tablet  0  . sertraline (ZOLOFT) 50 MG tablet Take 50 mg by mouth daily.      Marland Kitchen sulfamethoxazole-trimethoprim (BACTRIM) 400-80 MG per tablet Take 1 tablet by mouth daily.  30 tablet  11   No current facility-administered medications on file prior to visit.   Active Ambulatory Problems   Diagnosis Date Noted  . HIV (human immunodeficiency virus infection) 07/27/2011  . Chlamydia 07/27/2011  . H/O abnormal cervical Papanicolaou smear 07/26/2008  . Acute otitis externa of left ear 03/15/2013  . acute otitis media 03/15/2013  . Tobacco abuse 03/15/2013  . h/o crack Cocaine abuse 03/15/2013  . h/o oral Thrush 03/15/2013  . Gonorrhea 03/15/2013  . Acute sinusitis 03/15/2013  . Protein-calorie malnutrition, severe 03/16/2013   Resolved Ambulatory Problems    Diagnosis Date Noted  . Underweight 07/27/2011  . Unspecified protein-calorie malnutrition 03/15/2013   Past Medical History  Diagnosis Date  . Crack cocaine use   . Anxiety   . Depression   . Schizophrenia   . Bipolar disorder   . 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 Otitis media of left ear 03/15/2013      Review of Systems Denies fever, chills, nightsweats, diarrhea, nor nightsweats, but does + nausea, treated with meds. Ear pain improved    Objective:   Physical Exam BP 101/64  Pulse 85  Temp(Src) 97.6 F (36.4 C) (Oral)  Ht 5\' 4"  (1.626 m)  Wt 96 lb 4 oz (43.659 kg)  BMI 16.51 kg/m2 Physical Exam  Constitutional:  oriented to person, place, and time.  appears well-developed and well-nourished. No distress.  HENT: TMs are cleared bilaterally Mouth/Throat: Oropharynx is clear and  moist. No oropharyngeal exudate.  Cardiovascular: Normal rate, regular rhythm and normal heart sounds. Exam reveals no gallop and no friction rub.  No murmur heard.  Pulmonary/Chest: Effort normal and breath sounds normal. No respiratory distress.  no wheezes.  Lymphadenopathy: no cervical adenopathy.  Skin: Skin is warm and dry. No rash noted. No erythema.  Psychiatric:  normal mood and affect.  behavior is normal.      Assessment & Plan:  HIV = continue with tivicay and truvada  OI prophylaxis = can stop taking bactrim in mid November, will likely stop at next visit  Otitis externa = appears improved/resolved  Cachexia = she  continues to gain weight, now back to her baseline, despite being < 100 lb.  Psych = reminded her to go to [facility] today  rtc in 2 wk

## 2013-06-29 ENCOUNTER — Ambulatory Visit: Payer: Self-pay | Admitting: Internal Medicine

## 2013-06-29 ENCOUNTER — Emergency Department (HOSPITAL_COMMUNITY)
Admission: EM | Admit: 2013-06-29 | Discharge: 2013-06-29 | Disposition: A | Discharge status: Home / Self Care | Payer: Self-pay | Attending: Emergency Medicine | Admitting: Emergency Medicine

## 2013-06-29 ENCOUNTER — Encounter (HOSPITAL_COMMUNITY): Payer: Self-pay | Admitting: Emergency Medicine

## 2013-06-29 DIAGNOSIS — K047 Periapical abscess without sinus: Secondary | ICD-10-CM | POA: Insufficient documentation

## 2013-06-29 DIAGNOSIS — F17200 Nicotine dependence, unspecified, uncomplicated: Secondary | ICD-10-CM | POA: Insufficient documentation

## 2013-06-29 DIAGNOSIS — Z21 Asymptomatic human immunodeficiency virus [HIV] infection status: Secondary | ICD-10-CM | POA: Insufficient documentation

## 2013-06-29 DIAGNOSIS — F319 Bipolar disorder, unspecified: Secondary | ICD-10-CM | POA: Insufficient documentation

## 2013-06-29 DIAGNOSIS — F411 Generalized anxiety disorder: Secondary | ICD-10-CM | POA: Insufficient documentation

## 2013-06-29 DIAGNOSIS — Z79899 Other long term (current) drug therapy: Secondary | ICD-10-CM | POA: Insufficient documentation

## 2013-06-29 DIAGNOSIS — K029 Dental caries, unspecified: Secondary | ICD-10-CM | POA: Insufficient documentation

## 2013-06-29 DIAGNOSIS — F1210 Cannabis abuse, uncomplicated: Secondary | ICD-10-CM | POA: Insufficient documentation

## 2013-06-29 DIAGNOSIS — F209 Schizophrenia, unspecified: Secondary | ICD-10-CM | POA: Insufficient documentation

## 2013-06-29 MED ORDER — HYDROCODONE-ACETAMINOPHEN 5-325 MG PO TABS: 1.0000 | ORAL_TABLET | Freq: Four times a day (QID) | ORAL | Status: DC | PRN

## 2013-06-29 MED ORDER — PENICILLIN V POTASSIUM 250 MG PO TABS: 250.0000 mg | ORAL_TABLET | Freq: Four times a day (QID) | ORAL | Status: AC

## 2013-06-29 NOTE — ED Notes (Addendum)
Pt c/o R lower dental pain and jaw swelling x 1 month.  Pain score 8/10.  Dental cavity noted.  Pt sts "it did this before and went away without medication, but it is worse this time."

## 2013-06-29 NOTE — ED Provider Notes (Addendum)
CSN: [id number]     Arrival date & time 06/29/13  0847 History   First MD Initiated Contact with Patient 06/29/13 [phone]     Chief Complaint  Patient presents with  . Dental Pain  . Facial Swelling   (Consider location/radiation/quality/duration/timing/severity/associated sxs/prior Treatment) Patient is a 27 y.o. female presenting with tooth pain. The history is provided by the patient.  Dental Pain Location:  Lower Lower teeth location:  30/RL 1st molar Quality:  Sharp, radiating, shooting and throbbing Severity:  Severe Onset quality:  Gradual Duration:  2 days Timing:  Constant Progression:  Worsening Chronicity:  Recurrent Context: abscess and dental caries   Relieved by:  Nothing Worsened by:  Cold food/drink, hot food/drink, jaw movement and touching Ineffective treatments:  None tried Associated symptoms: facial pain and facial swelling   Associated symptoms: no congestion, no difficulty swallowing and no drooling   Risk factors: immunosuppression, lack of dental care and periodontal disease     Past Medical History  Diagnosis Date  . Crack cocaine use   . Anxiety   . Depression   . Schizophrenia   . Bipolar disorder   . 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 Otitis media of left ear 03/15/2013   Past Surgical History  Procedure Laterality Date  . Colposcopy  2009   No family history on file. History  Substance Use Topics  . Smoking status: Current Every Day Smoker -- 0.50 packs/day for 3 years    Types: Cigarettes  . Smokeless tobacco: Never Used     Comment: cutting back  . Alcohol Use: No   OB History   Grav Para Term Preterm Abortions TAB SAB Ect Mult Living   2 1 1  0 1 0 1 0 0 1     Review of Systems  HENT: Positive for facial swelling. Negative for congestion and drooling.   All other systems reviewed and are negative.    Allergies  Review of patient's allergies indicates no known allergies.  Home Medications   Current Outpatient Rx  Name  Route  Sig   Dispense  Refill  . antipyrine-benzocaine (AURALGAN) otic solution   Both Ears   Place 3 drops into both ears every 2 (two) hours as needed for pain.   10 mL   0   . dolutegravir (TIVICAY) 50 MG tablet   Oral   Take 1 tablet (50 mg total) by mouth daily.   30 tablet   11   . emtricitabine-tenofovir (TRUVADA) 200-300 MG per tablet   Oral   Take 1 tablet by mouth daily.   30 tablet   11   . ENSURE (ENSURE)   Oral   Take 237 mLs by mouth 2 (two) times daily between meals. Dispense 1-2  case per month; for severe protein-malnutrition, BMI 13   237 mL   11   . lurasidone (LATUDA) 40 MG TABS   Oral   Take 40 mg by mouth daily.          . ondansetron (ZOFRAN) 4 MG tablet   Oral   Take 1 tablet (4 mg total) by mouth every 6 (six) hours.   12 tablet   0   . sertraline (ZOLOFT) 50 MG tablet   Oral   Take 50 mg by mouth daily.         Marland Kitchen sulfamethoxazole-trimethoprim (BACTRIM) 400-80 MG per tablet   Oral   Take 1 tablet by mouth daily.   30 tablet   11  BP 112/79  Pulse 71  Resp 25  SpO2 100% Physical Exam  Nursing note and vitals reviewed. Constitutional: She is oriented to person, place, and time. She appears well-developed and well-nourished. No distress.  HENT:  Head: Normocephalic and atraumatic.    Mouth/Throat: Oropharynx is clear and moist. No trismus in the jaw. Dental abscesses present. No uvula swelling.    Eyes: Conjunctivae and EOM are normal. Pupils are equal, round, and reactive to light.  Neck: Normal range of motion. Neck supple.  Cardiovascular: Normal rate.   Pulmonary/Chest: Effort normal.  Neurological: She is alert and oriented to person, place, and time.  Skin: Skin is warm and dry. No rash noted. No erythema.  Psychiatric: She has a normal mood and affect. Her behavior is normal.    ED Course  Procedures (including critical care time) Labs Review Labs Reviewed - No data to display Imaging Review No results found.  EKG  Interpretation   None      INCISION AND DRAINAGE Performed by: Gwyneth Sprout Consent: Verbal consent obtained. Risks and benefits: risks, benefits and alternatives were discussed Type: abscess  Body area: right lower dental  Anesthesia: local infiltration - apical dental block  Incision was made with a scalpel.  Local anesthetic: Bupivacaine 0.25% with epi Anesthetic total: 1 ml  Complexity: simple Drainage: purulent  Drainage amount: 2mL  Packing material: none  Patient tolerance: Patient tolerated the procedure well with no immediate complications.    MDM   1. Dental abscess     Pt with dental caries and facial swelling.  No signs of ludwig's angina or difficulty swallowing and no systemic symptoms. Abscess drained. Will treat with PCN and have pt f/u with dentist.    Gwyneth Sprout, MD 06/29/13 1610  Gwyneth Sprout, MD 06/29/13 9604  Gwyneth Sprout, MD 06/29/13 [phone]

## 2013-06-29 NOTE — Progress Notes (Signed)
[facility] provided pt with a list of primary care resources and a [facility] [employer] [facility] application.  °

## 2013-06-29 NOTE — ED Notes (Signed)
MD at bedside. 

## 2013-07-17 ENCOUNTER — Telehealth: Payer: Self-pay | Admitting: *Deleted

## 2013-07-17 NOTE — Telephone Encounter (Signed)
Appointments made for Dr. Drue Second and PAP smear on Monday, Dec. 8th.

## 2013-07-21 ENCOUNTER — Inpatient Hospital Stay (HOSPITAL_COMMUNITY)
Admission: AD | Admit: 2013-07-21 | Discharge: 2013-07-21 | Discharge status: Against Medical Advice | Payer: Self-pay | Source: Ambulatory Visit | Attending: Obstetrics and Gynecology | Admitting: Obstetrics and Gynecology

## 2013-07-21 ENCOUNTER — Inpatient Hospital Stay (HOSPITAL_COMMUNITY)
Admission: AD | Admit: 2013-07-21 | Discharge: 2013-07-23 | DRG: 689 | Disposition: A | Discharge status: Home / Self Care | Payer: Self-pay | Source: Ambulatory Visit | Attending: Internal Medicine | Admitting: Internal Medicine

## 2013-07-21 ENCOUNTER — Inpatient Hospital Stay (HOSPITAL_COMMUNITY): Payer: Self-pay

## 2013-07-21 ENCOUNTER — Encounter (HOSPITAL_COMMUNITY): Payer: Self-pay | Admitting: Family Medicine

## 2013-07-21 ENCOUNTER — Encounter (HOSPITAL_COMMUNITY): Payer: Self-pay | Admitting: *Deleted

## 2013-07-21 DIAGNOSIS — E871 Hypo-osmolality and hyponatremia: Secondary | ICD-10-CM | POA: Insufficient documentation

## 2013-07-21 DIAGNOSIS — Z720 Tobacco use: Secondary | ICD-10-CM

## 2013-07-21 DIAGNOSIS — R109 Unspecified abdominal pain: Secondary | ICD-10-CM | POA: Insufficient documentation

## 2013-07-21 DIAGNOSIS — F319 Bipolar disorder, unspecified: Secondary | ICD-10-CM | POA: Diagnosis present

## 2013-07-21 DIAGNOSIS — E876 Hypokalemia: Secondary | ICD-10-CM | POA: Diagnosis present

## 2013-07-21 DIAGNOSIS — Z681 Body mass index (BMI) 19 or less, adult: Secondary | ICD-10-CM

## 2013-07-21 DIAGNOSIS — O2301 Infections of kidney in pregnancy, first trimester: Secondary | ICD-10-CM

## 2013-07-21 DIAGNOSIS — H60399 Other infective otitis externa, unspecified ear: Secondary | ICD-10-CM

## 2013-07-21 DIAGNOSIS — O3680X1 Pregnancy with inconclusive fetal viability, fetus 1: Secondary | ICD-10-CM

## 2013-07-21 DIAGNOSIS — H60502 Unspecified acute noninfective otitis externa, left ear: Secondary | ICD-10-CM

## 2013-07-21 DIAGNOSIS — Z21 Asymptomatic human immunodeficiency virus [HIV] infection status: Secondary | ICD-10-CM | POA: Insufficient documentation

## 2013-07-21 DIAGNOSIS — B370 Candidal stomatitis: Secondary | ICD-10-CM

## 2013-07-21 DIAGNOSIS — B20 Human immunodeficiency virus [HIV] disease: Secondary | ICD-10-CM

## 2013-07-21 DIAGNOSIS — J019 Acute sinusitis, unspecified: Secondary | ICD-10-CM

## 2013-07-21 DIAGNOSIS — F1410 Cocaine abuse, uncomplicated: Secondary | ICD-10-CM

## 2013-07-21 DIAGNOSIS — F411 Generalized anxiety disorder: Secondary | ICD-10-CM | POA: Diagnosis present

## 2013-07-21 DIAGNOSIS — A749 Chlamydial infection, unspecified: Secondary | ICD-10-CM

## 2013-07-21 DIAGNOSIS — F17200 Nicotine dependence, unspecified, uncomplicated: Secondary | ICD-10-CM | POA: Diagnosis present

## 2013-07-21 DIAGNOSIS — N12 Tubulo-interstitial nephritis, not specified as acute or chronic: Principal | ICD-10-CM | POA: Diagnosis present

## 2013-07-21 DIAGNOSIS — A549 Gonococcal infection, unspecified: Secondary | ICD-10-CM

## 2013-07-21 DIAGNOSIS — Z8742 Personal history of other diseases of the female genital tract: Secondary | ICD-10-CM

## 2013-07-21 DIAGNOSIS — O99340 Other mental disorders complicating pregnancy, unspecified trimester: Secondary | ICD-10-CM | POA: Insufficient documentation

## 2013-07-21 DIAGNOSIS — O0930 Supervision of pregnancy with insufficient antenatal care, unspecified trimester: Secondary | ICD-10-CM | POA: Insufficient documentation

## 2013-07-21 DIAGNOSIS — F209 Schizophrenia, unspecified: Secondary | ICD-10-CM | POA: Diagnosis present

## 2013-07-21 DIAGNOSIS — E43 Unspecified severe protein-calorie malnutrition: Secondary | ICD-10-CM | POA: Diagnosis present

## 2013-07-21 DIAGNOSIS — Z79899 Other long term (current) drug therapy: Secondary | ICD-10-CM

## 2013-07-21 DIAGNOSIS — O98519 Other viral diseases complicating pregnancy, unspecified trimester: Secondary | ICD-10-CM | POA: Insufficient documentation

## 2013-07-21 LAB — URINALYSIS, ROUTINE W REFLEX MICROSCOPIC
Bilirubin Urine: NEGATIVE
Ketones, ur: NEGATIVE mg/dL
Nitrite: POSITIVE — AB
Protein, ur: 30 mg/dL — AB
Urobilinogen, UA: 0.2 mg/dL (ref 0.0–1.0)

## 2013-07-21 LAB — COMPREHENSIVE METABOLIC PANEL
AST: 19 U/L (ref 0–37)
Alkaline Phosphatase: 74 U/L (ref 39–117)
BUN: 12 mg/dL (ref 6–23)
CO2: 22 mEq/L (ref 19–32)
Calcium: 8.6 mg/dL (ref 8.4–10.5)
Chloride: 94 mEq/L — ABNORMAL LOW (ref 96–112)
Creatinine, Ser: 0.8 mg/dL (ref 0.50–1.10)
GFR calc Af Amer: >90 mL/min (ref >90)
GFR calc non Af Amer: >90 mL/min (ref >90)
Potassium: 3.4 mEq/L — ABNORMAL LOW (ref 3.5–5.1)
Total Bilirubin: 0.2 mg/dL — ABNORMAL LOW (ref 0.3–1.2)

## 2013-07-21 LAB — CBC WITH DIFFERENTIAL/PLATELET
Basophils Absolute: 0 10*3/uL (ref 0.0–0.1)
HCT: 35.4 % — ABNORMAL LOW (ref 36.0–46.0)
Hemoglobin: 12 g/dL (ref 12.0–15.0)
Lymphocytes Relative: 13 % (ref 12–46)
Lymphs Abs: 1.2 10*3/uL (ref 0.7–4.0)
MCH: 27.4 pg (ref 26.0–34.0)
Monocytes Absolute: 1.2 10*3/uL — ABNORMAL HIGH (ref 0.1–1.0)
Monocytes Relative: 13 % — ABNORMAL HIGH (ref 3–12)
Neutro Abs: 6.9 10*3/uL (ref 1.7–7.7)
Neutrophils Relative %: 74 % (ref 43–77)
Platelets: 176 10*3/uL (ref 150–400)
RBC: 4.38 MIL/uL (ref 3.87–5.11)
RDW: 14.1 % (ref 11.5–15.5)
WBC: 9.3 10*3/uL (ref 4.0–10.5)

## 2013-07-21 LAB — HCG, QUANTITATIVE, PREGNANCY: hCG, Beta Chain, Quant, S: 49906 m[IU]/mL — ABNORMAL HIGH (ref <5)

## 2013-07-21 LAB — URINE MICROSCOPIC-ADD ON

## 2013-07-21 LAB — LIPASE, BLOOD: Lipase: 11 U/L (ref 11–59)

## 2013-07-21 LAB — RAPID URINE DRUG SCREEN, HOSP PERFORMED
Amphetamines: NOT DETECTED
Barbiturates: NOT DETECTED
Benzodiazepines: NOT DETECTED

## 2013-07-21 LAB — POCT PREGNANCY, URINE: Preg Test, Ur: POSITIVE — AB

## 2013-07-21 LAB — MAGNESIUM: Magnesium: 1.9 mg/dL (ref 1.5–2.5)

## 2013-07-21 MED ORDER — EMTRICITABINE-TENOFOVIR DF 200-300 MG PO TABS
1.0000 | ORAL_TABLET | Freq: Every day | ORAL | Status: DC
  Administered 2013-07-21 – 2013-07-23 (×3): 1 via ORAL
  Filled 2013-07-21 (×3): qty 1

## 2013-07-21 MED ORDER — FENTANYL CITRATE 0.05 MG/ML IJ SOLN
50.0000 ug | Freq: Once | INTRAMUSCULAR | Status: AC
  Administered 2013-07-21: 50 ug via INTRAVENOUS
  Filled 2013-07-21: qty 2

## 2013-07-21 MED ORDER — CEFTRIAXONE SODIUM 1 G IJ SOLR
1.0000 g | INTRAMUSCULAR | Status: DC
  Administered 2013-07-21: 1 g via INTRAVENOUS
  Filled 2013-07-21: qty 10

## 2013-07-21 MED ORDER — LURASIDONE HCL 40 MG PO TABS
40.0000 mg | ORAL_TABLET | Freq: Every day | ORAL | Status: DC
  Administered 2013-07-21 – 2013-07-23 (×3): 40 mg via ORAL
  Filled 2013-07-21 (×3): qty 1

## 2013-07-21 MED ORDER — SODIUM CHLORIDE 0.9 % IV SOLN
INTRAVENOUS | Status: DC
  Administered 2013-07-21: 07:00:00 via INTRAVENOUS

## 2013-07-21 MED ORDER — DEXTROSE 5 % IV SOLN
1.0000 g | INTRAVENOUS | Status: DC
  Administered 2013-07-21 – 2013-07-23 (×3): 1 g via INTRAVENOUS
  Filled 2013-07-21 (×3): qty 10

## 2013-07-21 MED ORDER — SERTRALINE HCL 50 MG PO TABS
50.0000 mg | ORAL_TABLET | Freq: Every day | ORAL | Status: DC
  Administered 2013-07-21 – 2013-07-23 (×3): 50 mg via ORAL
  Filled 2013-07-21 (×3): qty 1

## 2013-07-21 MED ORDER — ACETAMINOPHEN 325 MG PO TABS
650.0000 mg | ORAL_TABLET | Freq: Four times a day (QID) | ORAL | Status: DC | PRN
  Administered 2013-07-21: 650 mg via ORAL
  Filled 2013-07-21: qty 2

## 2013-07-21 MED ORDER — DOLUTEGRAVIR SODIUM 50 MG PO TABS
50.0000 mg | ORAL_TABLET | Freq: Every day | ORAL | Status: DC
Start: 2013-07-21 — End: 2013-07-23
  Administered 2013-07-21 – 2013-07-23 (×3): 50 mg via ORAL
  Filled 2013-07-21 (×3): qty 1

## 2013-07-21 MED ORDER — HEPARIN SODIUM (PORCINE) 5000 UNIT/ML IJ SOLN
5000.0000 [IU] | Freq: Three times a day (TID) | INTRAMUSCULAR | Status: DC
  Filled 2013-07-21 (×9): qty 1

## 2013-07-21 MED ORDER — HYDROCODONE-ACETAMINOPHEN 5-325 MG PO TABS: 1.0000 | ORAL_TABLET | ORAL | Status: DC | PRN

## 2013-07-21 MED ORDER — HYDROMORPHONE HCL PF 1 MG/ML IJ SOLN: 0.5000 mg | INTRAMUSCULAR | Status: DC | PRN

## 2013-07-21 MED ORDER — ONDANSETRON HCL 4 MG/2ML IJ SOLN: 4.0000 mg | Freq: Four times a day (QID) | INTRAMUSCULAR | Status: DC | PRN

## 2013-07-21 MED ORDER — LACTATED RINGERS IV SOLN: INTRAVENOUS | Status: DC

## 2013-07-21 MED ORDER — SODIUM CHLORIDE 0.9 % IV SOLN
INTRAVENOUS | Status: DC
  Administered 2013-07-21 – 2013-07-22 (×4): via INTRAVENOUS

## 2013-07-21 MED ORDER — ONDANSETRON HCL 4 MG PO TABS: 4.0000 mg | ORAL_TABLET | Freq: Four times a day (QID) | ORAL | Status: DC | PRN

## 2013-07-21 MED ORDER — POTASSIUM CHLORIDE CRYS ER 20 MEQ PO TBCR
40.0000 meq | EXTENDED_RELEASE_TABLET | Freq: Once | ORAL | Status: AC
  Administered 2013-07-21: 40 meq via ORAL
  Filled 2013-07-21: qty 2

## 2013-07-21 NOTE — Plan of Care (Addendum)
Patient accepted for transfer to [facility] from [facility], 27 yo F with Pyelonephritis, just found out today that she is [redacted] weeks pregnant, also is HIV positive.  Initial plan: rocephin for pyelonephritis, and ID consult (informal at least) for HIV Re: maternal to fetal transmission risk reduction (I know AZT has been studied extensively here but dont know how much studies have been done on other anti-retroviral regimens) and which (if any) of her anti-retrovirals are unsafe for pregnancy.

## 2013-07-21 NOTE — H&P (Signed)
[facility] History and Physical  Tamara Briggs ZOX:[id number] DOB: [birth year] DOA: 07/21/2013  Referring physician: ED physician PCP: Default, Provider, MD   Chief Complaint: fever, abdominal pain   HPI:  Pt is 27 yo female with HIV, polysubstance abuse, depression, presented with main concern of progressively worsening lower quadrants abdominal pain that started several days prior to this admission, constant and throbbing, 7/10 in severity and radiating to bilateral flank areas, no similar events in the past. This has been associated with subjective fevers, chills, malaise, nausea, poor oral intake, urinary urgency and frequency, dysuria. Pt denies chest pain or shortness of breath, no focal neurological symptoms.   In ED, pt found to be pregnant, UA suggestive of UTI and [facility] asked to admit for treatment of presumptive pyelonephritis given systemic symptoms.   Assessment and Plan:  Principal Problem:   Pyelonephritis - based on symptoms, physical exam findings, UA - urine cultures pending - will place on Rocephin for now Active Problems:   HIV (human immunodeficiency virus infection) - will ask ID for recommendations on ART as pt is now pregnant    h/o crack Cocaine abuse - cessation discussed in detail   Protein-calorie malnutrition, severe - encouraged PO intake    Hyponatremia - secondary to pre renal etiology - will place on IVF and repeat BMP in AM   Hypokalemia - will supplement orally - repeat BMP in AM  Code Status: Full Family Communication: Pt at bedside Disposition Plan: Admit to medical floor   Review of Systems:  Constitutional: Positive for fever, chills and malaise/fatigue. Negative for diaphoresis.  HENT: Negative for hearing loss, ear pain, nosebleeds, congestion, sore throat, neck pain, tinnitus and ear discharge.   Eyes: Negative for blurred vision, double vision, photophobia, pain, discharge and redness.  Respiratory: Negative for cough,  hemoptysis, sputum production, shortness of breath, wheezing and stridor.   Cardiovascular: Negative for chest pain, palpitations, orthopnea, claudication and leg swelling.  Gastrointestinal: Negative for heartburn, constipation, blood in stool and melena.  Genitourinary: Positive for dysuria, urgency, frequency, and flank pain.  Musculoskeletal: Negative for myalgias, back pain, joint pain and falls.  Skin: Negative for itching and rash.  Neurological: Negative for tingling, tremors, sensory change, speech change, focal weakness, loss of consciousness and headaches.  Endo/Heme/Allergies: Negative for environmental allergies and polydipsia. Does not bruise/bleed easily.  Psychiatric/Behavioral: Negative for suicidal ideas. The patient is not nervous/anxious.      Past Medical History  Diagnosis Date  . Crack cocaine use   . Anxiety   . Depression   . Schizophrenia   . Bipolar disorder   . 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 Otitis media of left ear 03/15/2013    Past Surgical History  Procedure Laterality Date  . Colposcopy  2009    Social History:  reports that she has been smoking Cigarettes.  She has a 1.5 pack-year smoking history. She has never used smokeless tobacco. She reports that she uses illicit drugs (Cocaine). She reports that she does not drink alcohol.  No Known Allergies  No known family medical history   Prior to Admission medications   Medication Sig Start Date End Date Taking? Authorizing Provider  dolutegravir (TIVICAY) 50 MG tablet Take 1 tablet (50 mg total) by mouth daily. 03/07/13  Yes Judyann Munson, MD  emtricitabine-tenofovir (TRUVADA) 200-300 MG per tablet Take 1 tablet by mouth daily. 03/07/13  Yes Judyann Munson, MD  HYDROcodone-acetaminophen (NORCO/VICODIN) 5-325 MG per tablet Take 1 tablet by mouth every 6 (six) hours  as needed. 06/29/13  Yes Gwyneth Sprout, MD  lurasidone (LATUDA) 40 MG TABS Take 40 mg by mouth daily.    Yes Historical Provider, MD  sertraline  (ZOLOFT) 50 MG tablet Take 50 mg by mouth daily.   Yes Historical Provider, MD    Physical Exam: There were no vitals filed for this visit.  Physical Exam  Constitutional: Appears well-developed and well-nourished. No distress.  HENT: Normocephalic. External right and left ear normal. Oropharynx is clear and moist.  Eyes: Conjunctivae and EOM are normal. PERRLA, no scleral icterus.  Neck: Normal ROM. Neck supple. No JVD. No tracheal deviation. No thyromegaly.  CVS: RRR, S1/S2 +, no murmurs, no gallops, no carotid bruit.  Pulmonary: Effort and breath sounds normal, no stridor, rhonchi, wheezes, rales.  Abdominal: Soft. BS +,  no distension, tenderness in lower abdominal quadrants and flank area bilaterally, no rebound or guarding.  Musculoskeletal: Normal range of motion. No edema and no tenderness.  Lymphadenopathy: No lymphadenopathy noted, cervical, inguinal. Neuro: Alert. Normal reflexes, muscle tone coordination. No cranial nerve deficit. Skin: Skin is warm and dry. No rash noted. Not diaphoretic. No erythema. No pallor.  Psychiatric: Normal mood and affect. Behavior, judgment, thought content normal.   Labs on Admission:  Basic Metabolic Panel:  Recent Labs Lab 07/21/13 0415  NA 128*  K 3.4*  CL 94*  CO2 22  GLUCOSE 108*  BUN 12  CREATININE 0.80  CALCIUM 8.6   Liver Function Tests:  Recent Labs Lab 07/21/13 0415  AST 19  ALT 12  ALKPHOS 74  BILITOT 0.2*  PROT 8.1  ALBUMIN 3.2*    Recent Labs Lab 07/21/13 0415  LIPASE 11   CBC:  Recent Labs Lab 07/21/13 0415  WBC 9.3  NEUTROABS 6.9  HGB 12.0  HCT 35.4*  MCV 80.8  PLT 176    Radiological Exams on Admission: US Ob Comp Less 14 Wks  07/21/2013   CLINICAL DATA:  EGA, if H teeth, right lower quadrant pain, HIV positive. Estimated gestational age by LMP is 20 weeks 5 days. Quantitative beta HCG is not available.  EXAM: OBSTETRIC <14 WK ULTRASOUND  TECHNIQUE: Transabdominal ultrasound was performed  for evaluation of the gestation as well as the maternal uterus and adnexal regions.  COMPARISON:  None.  FINDINGS: Intrauterine gestational sac: A single intrauterine gestational sac is visualized.  Yolk sac:  Yolk sac is visualized.  Embryo:  The fetal pole is visualized.  Cardiac Activity: Fetal cardiac activity is observed.  Heart Rate: 132 bpm  CRL:   9  mm   6 w 6 d                  [place] EDC: 03/10/2014  Maternal uterus/adnexae: The uterus is anteverted. No myometrial masses are demonstrated. Both ovaries are visualized and appear symmetrical. No abnormal adnexal masses. Images are obtained of the area of pain on the right, demonstrating no focal lesion. No subchorionic hemorrhage. No free pelvic fluid collections.  IMPRESSION: Single intrauterine pregnancy. Estimated gestational age by crown-rump length is 6 weeks 6 days. No abnormal adnexal masses.   Electronically Signed   By: Burman Nieves M.D.   On: 07/21/2013 05:09    EKG: Normal sinus rhythm, no ST/T wave changes  Debbora Presto, MD  [facility] Pager [phone]  If 7PM-7AM, please contact night-coverage [email or web address] Password [email or web address] 07/21/2013, 9:06 AM

## 2013-07-21 NOTE — ED Notes (Signed)
Bed: WA15 Expected date:  Expected time:  Means of arrival:  Comments: [facility] 

## 2013-07-21 NOTE — Progress Notes (Signed)
Dr. Casper Harrison

## 2013-07-21 NOTE — MAU Note (Signed)
PT ARRIVED VIA EMS-  - NO PNC-  EXCEPT FOR MAU-  NO U/S. NO BLEEDING. LAST SEX- 11-25.   LMP- 7-6.  PT SAYS SHE CALLED [facility] YESTERDAY - FOR  ABD PAIN- HA SBEEN TAKING EXCEDRIN- TOOK LAST  AT 0300. HAS HAD ABD PAIN X4 DAYS.

## 2013-07-21 NOTE — MAU Provider Note (Signed)
History     CSN: [id number]  Arrival date and time: 07/21/13 0347   Chief Complaint  Patient presents with  . Abdominal Pain   HPI Comments: Pt is a 27 y.o. G3P1011 at unknown gestational age (see below) who presents with abdominal pain for 4 days. Her pain was in the left lower quadrant and has moved to her right lower quadrant, and has been constant today. She has been taking Excedrin, which has helped with the pain, but it returns "after a few hours." Pain is described as sharp / aching, intermittently worse, worse with some movements. Pt has not had recent fevers / chills but does endorse vomiting as recently as 2 days ago. She denies SOB, chest pain, cough, vaginal discharge or bleeding. She does endorse some increased urinary urge without burning or increased frequency (states she sits to urinate, and feels like she has to keep urinating even after she stops).  PMH significant for HIV (on two oral antiretrovirals, last saw Dr. Drue Second about 1 month ago) and sees [place] for mental health, on Zoloft and [place]. Pt reports compliance with medications. She reports she smokes and has used cocaine in the past (last use documented in [place] as reported by pt in a previous encounter was "August 2014").  Pt has had no prenatal care; she did not know she was pregnant before tonight. Her LMP was "in July" but she states she has had "some bleeding" on and off since then, but she is unsure if any of these episodes were periods. Her last intercourse was about 4 days ago. She denies contractions or feeling any fetal movements. She states she was seen "about a month ago" at [facility] with a negative pregnancy test (pt does have a negative test in [facility] in early September). Pt has had one child and one SAB in the past.   OB History   Grav Para Term Preterm Abortions TAB SAB Ect Mult Living   3 1 1  0 1 0 1 0 0 1      Past Medical History  Diagnosis Date  . Crack cocaine use   . Anxiety   . Depression   .  Schizophrenia   . Bipolar disorder   . 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 Otitis media of left ear 03/15/2013    Past Surgical History  Procedure Laterality Date  . Colposcopy  2009    No family history on file.  History  Substance Use Topics  . Smoking status: Current Every Day Smoker -- 0.50 packs/day for 3 years    Types: Cigarettes  . Smokeless tobacco: Never Used     Comment: cutting back  . Alcohol Use: No    Allergies: No Known Allergies  Prescriptions prior to admission  Medication Sig Dispense Refill  . antipyrine-benzocaine (AURALGAN) otic solution Place 3 drops into both ears every 2 (two) hours as needed for pain.  10 mL  0  . dolutegravir (TIVICAY) 50 MG tablet Take 1 tablet (50 mg total) by mouth daily.  30 tablet  11  . emtricitabine-tenofovir (TRUVADA) 200-300 MG per tablet Take 1 tablet by mouth daily.  30 tablet  11  . HYDROcodone-acetaminophen (NORCO/VICODIN) 5-325 MG per tablet Take 1 tablet by mouth every 6 (six) hours as needed.  10 tablet  0  . lurasidone (LATUDA) 40 MG TABS Take 40 mg by mouth daily.        ROS: As above in HPI.  Physical Exam   Blood pressure 120/64, pulse  91, temperature 97.9 F (36.6 C), temperature source Oral, resp. rate 20, last menstrual period 02/26/2013.  Physical Exam  Nursing note and vitals reviewed. Constitutional: She is oriented to person, place, and time. She appears well-developed and well-nourished. No distress.  HENT:  Head: Normocephalic and atraumatic.  Eyes: EOM are normal.  Neck: Normal range of motion. Neck supple. No thyromegaly present.  Cardiovascular: Normal rate, regular rhythm and normal heart sounds.   No murmur heard. Respiratory: Effort normal and breath sounds normal. No respiratory distress.  GI: Soft. Bowel sounds are normal. There is tenderness.  Moderate, worst in right lower quadrant. No definite Rovsing's or Murphy's signs. Positive right CVA tenderness / flank tenderness. Increased pain with  flexion at knee and hip with internal and external rotation of hip on the right.  No pain with ROM of left hip.  Genitourinary:  Deferred  Musculoskeletal: Normal range of motion. She exhibits no edema.  Neurological: She is alert and oriented to person, place, and time.  Skin: Skin is warm and dry. No erythema.  Psychiatric: She has a normal mood and affect. Her behavior is normal.    MAU Course  Procedures MDM 0420: History and exam as above. POC urine pregnancy test positive. Broad DDx at this point, including but not limited to UTI/pyelonephritis, appendicitis, ectopic pregnancy. Will check OB ultrasound (abd and transvaginal), CMP, lipase, CBC, and quantitative b-HCG. UA and UDS also pending. Vitals stable. Continue to monitor.  0500: CBC generally unremarkable (some relative leukocytosis in HIV patient, but Hb also WNR with anemia in the recent past, so also possibly hemoconcentrated). Quantitative b-HCG 49k. UA suggestive of UTI (positive nitrites, moderate Hb, small leukocytes). Urine culture sent. Will await [place] and other lab results to decide on course of treatment and disposition.  0515: US shows IUP at approximately 6w 6d with no adnexal masses or suggestion of abscess. CMP significant for Na of 128 and K of 3.4. Discussed with Dr. Marice Potter. Feel pt needs medical admission for management of pyelonephritis in HIV patient, with no current OB-related issues other than intrauterine previable pregnancy.  0530: Discussed with [facility] ED attending Dr. Micheline Maze at [facility], who requests direct discussion with [facility] for admission.   24: Discussed with Dr. Toniann Fail ([facility] on call for [facility] admissions) who initially stated that [facility] admissions typically go to [facility]. After brief discussion, however, he states since pt has no PCP but does see ID (Dr. Drue Second), she should be admitted by IM teaching service.  2: Discussed with Dr. Virgina Organ (IM teaching service PGY-2), who states  that pt should be admitted in unassigned pool (which again would be [facility]) as she does not have a PCP, regardless of her ID primary; she discussed with her attending, who agreed that pt should be admitted in unassigned pool. Will contact [facility] hospitalist.  [phone]: Discussed with Dr. Julian Reil ([facility], on call for [facility] admissions) who accepts the patient in transfer.    Assessment and Plan  27 y.o. HIV-positive female G3P1011 at 6w by [place], presenting with abd pain, very likely with pyelonephritis, also with hyponatremia and mild hypokalemia -no current obstetrics complications to warrant admission to [facility] service but does need medical admission for pyelo -will admit to [facility] for IV abx and further management, accepting Dr. Julian Reil -will start CTX and IVF here; will likely need ID consult -will need prenatal follow-up to initiate prenatal care once acute medical issues are resolved  Bobbye Morton, MD PGY-2, [facility] 07/21/2013, 4:41 AM

## 2013-07-21 NOTE — Progress Notes (Signed)
Pt had a temp of 103.0.  Called MD orders received for Tylenol. Tykia Mellone, Doran Durand, RN

## 2013-07-21 NOTE — Care Management Note (Signed)
   CARE MANAGEMENT NOTE 07/21/2013  Patient:  Tamara Briggs, Tamara Briggs   Account Number:  [id number]  Date Initiated:  07/21/2013  Documentation initiated by:  Darrio Bade  Subjective/Objective Assessment:   27 yo female admitted with pyelonephritis. No PCP on record.     Action/Plan:   Home when stable   Anticipated DC Date:     Anticipated DC Plan:  HOME/SELF CARE  In-house referral  Financial Counselor      DC Planning Services  CM consult      Choice offered to / List presented to:             Status of service:  In process, will continue to follow Medicare Important Message given?   (If response is "NO", the following Medicare IM given date fields will be blank) Date Medicare IM given:   Date Additional Medicare IM given:    Discharge Disposition:    Per UR Regulation:  Reviewed for med. necessity/level of care/duration of stay  If discussed at Long Length of Stay Meetings, dates discussed:    Comments:  07/21/13 1514 Chart reviewed for utilization of services. Pt self pay, Financial counselor to consult. Cm to consult concerning PCP and possible medication assistance.

## 2013-07-22 LAB — BASIC METABOLIC PANEL
CO2: 18 mEq/L — ABNORMAL LOW (ref 19–32)
Calcium: 7.7 mg/dL — ABNORMAL LOW (ref 8.4–10.5)
Creatinine, Ser: 0.66 mg/dL (ref 0.50–1.10)
GFR calc Af Amer: >90 mL/min (ref >90)
Glucose, Bld: 97 mg/dL (ref 70–99)
Potassium: 3.5 mEq/L (ref 3.5–5.1)
Sodium: 130 mEq/L — ABNORMAL LOW (ref 135–145)

## 2013-07-22 LAB — CBC
Hemoglobin: 10.7 g/dL — ABNORMAL LOW (ref 12.0–15.0)
MCH: 27.9 pg (ref 26.0–34.0)
MCV: 82.6 fL (ref 78.0–100.0)
Platelets: 156 10*3/uL (ref 150–400)
RBC: 3.84 MIL/uL — ABNORMAL LOW (ref 3.87–5.11)
WBC: 9.1 10*3/uL (ref 4.0–10.5)

## 2013-07-22 MED ORDER — POTASSIUM CHLORIDE CRYS ER 20 MEQ PO TBCR
40.0000 meq | EXTENDED_RELEASE_TABLET | Freq: Once | ORAL | Status: AC
  Administered 2013-07-22: 40 meq via ORAL
  Filled 2013-07-22: qty 2

## 2013-07-22 NOTE — Progress Notes (Signed)
Patient ID: Tamara Briggs, female   DOB: 1986-02-21, 27 y.o.   MRN: [id number]  [facility] PROGRESS NOTE  Tamara Briggs MVH:[id number] DOB: 06/13/1986 DOA: 07/21/2013 PCP: Default, Provider, MD  Brief narrative: Pt is 27 yo female with HIV, polysubstance abuse, depression, presented with main concern of progressively worsening lower quadrants abdominal pain that started several days prior to this admission, constant and throbbing, 7/10 in severity and radiating to bilateral flank areas, no similar events in the past. This has been associated with subjective fevers, chills, malaise, nausea, poor oral intake, urinary urgency and frequency, dysuria. Pt denies chest pain or shortness of breath, no focal neurological symptoms.   In ED, pt found to be pregnant, UA suggestive of UTI and [facility] asked to admit for treatment of presumptive pyelonephritis given systemic symptoms.   Assessment and Plan:  Principal Problem:  Pyelonephritis  - based on symptoms, physical exam findings, UA  - urine cultures pending  - continue Rocephin day #2 Active Problems:  HIV (human immunodeficiency virus infection)  - will ask ID for recommendations on ART as pt is now pregnant  h/o crack Cocaine abuse  - cessation discussed in detail  Protein-calorie malnutrition, severe  - encouraged PO intake  Hyponatremia  - secondary to pre renal etiology  - improving, will continue IVF and repeat BMP in AM Hypokalemia  - supplemented and within normal limits this AM - repeat BMP in AM  Consultants:  None  Procedures/Studies: US Ob Comp Less 14 Wks   07/21/2013   Single intrauterine pregnancy. Estimated gestational age by crown-rump length is 6 weeks 6 days. No abnormal adnexal masses.   Electronically Signed    Antibiotics:  Rocephin 11/28 -->  Code Status: Full Family Communication: Pt at bedside Disposition Plan: Home when medically stable  HPI/Subjective: No events overnight.   Objective: Filed  Vitals:   07/21/13 1512 07/21/13 1725 07/21/13 2127 07/22/13 0505  BP:   114/74 99/54  Pulse:   94 96  Temp: 100.1 F (37.8 C) 98.5 F (36.9 C) 98 F (36.7 C) 98 F (36.7 C)  TempSrc: Oral Oral Oral Oral  Resp:   18 16  Height:      Weight:      SpO2:   99%     Intake/Output Summary (Last 24 hours) at 07/22/13 8413 Last data filed at 07/22/13 0600  Gross per 24 hour  Intake   1440 ml  Output   1800 ml  Net   -360 ml    Exam:   General:  Pt is alert, follows commands appropriately, not in acute distress  Cardiovascular: Regular rate and rhythm, S1/S2, no murmurs, no rubs, no gallops  Respiratory: Clear to auscultation bilaterally, no wheezing, no crackles, no rhonchi  Abdomen: Soft, tender in the right lower abd quadrant and right flank area, non distended, bowel sounds present, no guarding  Extremities: No edema, pulses DP and PT palpable bilaterally  Neuro: Grossly nonfocal  Data Reviewed: Basic Metabolic Panel:  Recent Labs Lab 07/21/13 0415 07/21/13 1100 07/22/13 0510  NA 128*  --  130*  K 3.4*  --  3.5  CL 94*  --  102  CO2 22  --  18*  GLUCOSE 108*  --  97  BUN 12  --  8  CREATININE 0.80  --  0.66  CALCIUM 8.6  --  7.7*  MG  --  1.9  --    Liver Function Tests:  Recent Labs Lab 07/21/13 0415  AST  19  ALT 12  ALKPHOS 74  BILITOT 0.2*  PROT 8.1  ALBUMIN 3.2*    Recent Labs Lab 07/21/13 0415  LIPASE 11    CBC:  Recent Labs Lab 07/21/13 0415 07/22/13 0510  WBC 9.3 9.1  NEUTROABS 6.9  --   HGB 12.0 10.7*  HCT 35.4* 31.7*  MCV 80.8 82.6  PLT 176 156   Scheduled Meds: . cefTRIAXone (ROCEPHIN)  IV  1 g Intravenous Q24H  . dolutegravir  50 mg Oral Daily  . emtricitabine-tenofovir  1 tablet Oral Daily  . heparin  5,000 Units Subcutaneous Q8H  . lurasidone  40 mg Oral Daily  . sertraline  50 mg Oral Daily   Continuous Infusions: . sodium chloride 100 mL/hr at 07/21/13 1549    Debbora Presto, MD  [facility] Pager  [phone]  If 7PM-7AM, please contact night-coverage [email or web address] Password [email or web address] 07/22/2013, 6:38 AM   LOS: 1 day

## 2013-07-23 LAB — CBC
HCT: 30.9 % — ABNORMAL LOW (ref 36.0–46.0)
Hemoglobin: 10.3 g/dL — ABNORMAL LOW (ref 12.0–15.0)
MCHC: 33.3 g/dL (ref 30.0–36.0)
MCV: 83.5 fL (ref 78.0–100.0)
Platelets: 157 10*3/uL (ref 150–400)
RDW: 14.6 % (ref 11.5–15.5)
WBC: 6.5 10*3/uL (ref 4.0–10.5)

## 2013-07-23 LAB — BASIC METABOLIC PANEL
BUN: 6 mg/dL (ref 6–23)
Chloride: 103 mEq/L (ref 96–112)
Creatinine, Ser: 0.64 mg/dL (ref 0.50–1.10)
GFR calc Af Amer: >90 mL/min (ref >90)
Glucose, Bld: 89 mg/dL (ref 70–99)
Potassium: 3.7 mEq/L (ref 3.5–5.1)

## 2013-07-23 LAB — URINE CULTURE: Colony Count: NO GROWTH

## 2013-07-23 MED ORDER — CEPHALEXIN 500 MG PO CAPS: 500.0000 mg | ORAL_CAPSULE | Freq: Four times a day (QID) | ORAL | Status: DC

## 2013-07-23 MED ORDER — HYDROCODONE-ACETAMINOPHEN 5-325 MG PO TABS: 1.0000 | ORAL_TABLET | Freq: Four times a day (QID) | ORAL | Status: DC | PRN

## 2013-07-23 NOTE — Discharge Summary (Signed)
Physician Discharge Summary  Tamara Briggs UJW:[id number] DOB: 24-Sep-1985 DOA: 2013-08-10  PCP: Default, Provider, MD  Admit date: 08/10/2013 Discharge date: 07/23/2013  Recommendations for Outpatient Follow-up:  1. Pt will need to follow up with PCP in 2-3 weeks post discharge 2. Please obtain BMP to evaluate electrolytes and kidney function 3. Please also check CBC to evaluate Hg and Hct levels 4. Patient will continue taking cephalexin upon discharge. 5. The patient was advised to followup with OB as soon as possible 6. Patient made aware of her pregnancy test is positive and she's approximately [redacted] weeks pregnant at this time 7. Patient also advised to followup with Dr. Judyann Munson her infectious disease specialist as soon as possible to discuss ART  Discharge Diagnoses: Pyelonephritis Principal Problem:   Pyelonephritis Active Problems:   HIV (human immunodeficiency virus infection)   h/o crack Cocaine abuse   Protein-calorie malnutrition, severe   Hyponatremia   Hypokalemia  Discharge Condition: Stable  Diet recommendation: Heart healthy diet discussed in details   Brief narrative:  Pt is 27 yo female with HIV, polysubstance abuse, depression, presented with main concern of progressively worsening lower quadrants abdominal pain that started several days prior to this admission, constant and throbbing, 7/10 in severity and radiating to bilateral flank areas, no similar events in the past. This has been associated with subjective fevers, chills, malaise, nausea, poor oral intake, urinary urgency and frequency, dysuria. Pt denies chest pain or shortness of breath, no focal neurological symptoms.  In ED, pt found to be pregnant, UA suggestive of UTI and [facility] asked to admit for treatment of presumptive pyelonephritis given systemic symptoms.   Assessment and Plan:  Principal Problem:  Pyelonephritis  - based on symptoms, physical exam findings, UA  - continue Rocephin day #3  -  Urine culture with Escherichia coli sensitive to cephalexin and we'll therefore continue cephalexin upon discharge Active Problems:  HIV (human immunodeficiency virus infection)  - Patient advised to followup with infectious disease specialist as soon as possible to discuss continuation of appropriate HIV therapy h/o crack Cocaine abuse  - cessation discussed in detail  Protein-calorie malnutrition, severe  - encouraged PO intake  - Intake improved, patient tolerating current diet well Hyponatremia  - secondary to pre renal etiology  - Improving after supportive care provided Hypokalemia  - supplemented and within normal limits this AM   Consultants:  None Procedures/Studies:  US Ob Comp Less 14 Wks 08/10/2013 Single intrauterine pregnancy. Estimated gestational age by crown-rump length is 6 weeks 6 days. No abnormal adnexal masses. Electronically Signed  Antibiotics:  Rocephin 08-11-23 --> 11/30 Cephalexin 11/30 continue upon discharge  Code Status: Full  Family Communication: Pt at bedside   Discharge Exam: Filed Vitals:   07/23/13 0602  BP: 99/64  Pulse: 81  Temp: 98.3 F (36.8 C)  Resp: 18   Filed Vitals:   07/22/13 0505 07/22/13 1352 07/22/13 2237 07/23/13 0602  BP: 99/54 95/60 98/60  99/64  Pulse: 96 90 68 81  Temp: 98 F (36.7 C) 98.7 F (37.1 C) 98.3 F (36.8 C) 98.3 F (36.8 C)  TempSrc: Oral Oral Oral Oral  Resp: 16 16 16 18   Height:      Weight:      SpO2:  100% 100% 99%    General: Pt is alert, follows commands appropriately, not in acute distress Cardiovascular: Regular rate and rhythm, S1/S2 +, no murmurs, no rubs, no gallops Respiratory: Clear to auscultation bilaterally, no wheezing, no crackles, no rhonchi Abdominal: Soft,  non tender, non distended, bowel sounds +, no guarding Extremities: no edema, no cyanosis, pulses palpable bilaterally DP and PT Neuro: Grossly nonfocal  Discharge Instructions  Discharge Orders   Future Appointments  Provider Department Dept Phone   07/31/2013 9:45 AM Judyann Munson, MD [facility] for Infectious Disease [phone]   07/31/2013 10:00 AM [facility] Pap [facility] for Infectious Disease [phone]   Future Orders Complete By Expires   Diet - low sodium heart healthy  As directed    Increase activity slowly  As directed        Medication List         cephALEXin 500 MG capsule  Commonly known as:  KEFLEX  Take 1 capsule (500 mg total) by mouth 4 (four) times daily.     dolutegravir 50 MG tablet  Commonly known as:  TIVICAY  Take 1 tablet (50 mg total) by mouth daily.     emtricitabine-tenofovir 200-300 MG per tablet  Commonly known as:  TRUVADA  Take 1 tablet by mouth daily.     HYDROcodone-acetaminophen 5-325 MG per tablet  Commonly known as:  NORCO/VICODIN  Take 1 tablet by mouth every 6 (six) hours as needed for moderate pain.     lurasidone 40 MG Tabs tablet  Commonly known as:  LATUDA  Take 40 mg by mouth daily.     sertraline 50 MG tablet  Commonly known as:  ZOLOFT  Take 50 mg by mouth daily.           Follow-up Information   Follow up with Debbora Presto, MD. (As needed if symptoms worsen call my cell phone [phone])    Specialty:  Internal Medicine   Contact information:   [address] [place] [address] [phone]        The results of significant diagnostics from this hospitalization (including imaging, microbiology, ancillary and laboratory) are listed below for reference.     Microbiology: Recent Results (from the past 240 hour(s))  URINE CULTURE     Status: None   Collection Time    07/21/13  4:00 AM      Result Value Range Status   Specimen Description URINE, CLEAN CATCH   Final   Special Requests NONE   Final   Culture  Setup Time     Final   Value: 07/21/2013 10:05     Performed at [facility] Count     Final   Value: >=100,000 COLONIES/ML     Performed at [facility]   Culture     Final   Value: ESCHERICHIA COLI     Performed at [facility]   Report Status PENDING   Incomplete  URINE CULTURE     Status: None   Collection Time    07/21/13  1:22 PM      Result Value Range Status   Specimen Description URINE, CLEAN CATCH   Final   Special Requests NONE   Final   Culture  Setup Time     Final   Value: 07/22/2013 00:32     Performed at [facility] Count     Final   Value: NO GROWTH     Performed at [facility]   Culture     Final   Value: NO GROWTH     Performed at [facility]   Report Status 07/23/2013 FINAL   Final  Labs: Basic Metabolic Panel:  Recent Labs Lab 07/21/13 0415 07/21/13 1100 07/22/13 0510 07/23/13 0518  NA 128*  --  130* 133*  K 3.4*  --  3.5 3.7  CL 94*  --  102 103  CO2 22  --  18* 20  GLUCOSE 108*  --  97 89  BUN 12  --  8 6  CREATININE 0.80  --  0.66 0.64  CALCIUM 8.6  --  7.7* 8.2*  MG  --  1.9  --   --    Liver Function Tests:  Recent Labs Lab 07/21/13 0415  AST 19  ALT 12  ALKPHOS 74  BILITOT 0.2*  PROT 8.1  ALBUMIN 3.2*    Recent Labs Lab 07/21/13 0415  LIPASE 11   CBC:  Recent Labs Lab 07/21/13 0415 07/22/13 0510 07/23/13 0518  WBC 9.3 9.1 6.5  NEUTROABS 6.9  --   --   HGB 12.0 10.7* 10.3*  HCT 35.4* 31.7* 30.9*  MCV 80.8 82.6 83.5  PLT 176 156 157   SIGNED: Time coordinating discharge: Over 30 minutes  Debbora Presto, MD  [facility] 07/23/2013, 8:57 AM Pager [phone]  If 7PM-7AM, please contact night-coverage [email or web address] Password [email or web address]

## 2013-07-23 NOTE — Progress Notes (Signed)
Patient discharge home. Discharge instruction given understanding verbalized. No additional questions at this time.

## 2013-07-24 ENCOUNTER — Telehealth: Payer: Self-pay | Admitting: *Deleted

## 2013-07-24 NOTE — Telephone Encounter (Signed)
Patient needs to be seen in clinic as soon as possible d/t positive pregnancy test.  RN alerted pt's [employer], Dwain Sarna, as he has a good working relationship with her and can usually reach her when she is out of contact.  He will get in touch with her and bring her into clinic. Andree Coss, RN

## 2013-07-31 ENCOUNTER — Ambulatory Visit: Payer: Self-pay

## 2013-07-31 ENCOUNTER — Ambulatory Visit (INDEPENDENT_AMBULATORY_CARE_PROVIDER_SITE_OTHER): Payer: Self-pay | Admitting: Internal Medicine

## 2013-07-31 ENCOUNTER — Encounter: Payer: Self-pay | Admitting: Internal Medicine

## 2013-07-31 VITALS — BP 123/70 | HR 88 | Temp 97.9°F | Wt 100.0 lb

## 2013-07-31 DIAGNOSIS — A498 Other bacterial infections of unspecified site: Secondary | ICD-10-CM

## 2013-07-31 DIAGNOSIS — N390 Urinary tract infection, site not specified: Secondary | ICD-10-CM

## 2013-07-31 DIAGNOSIS — F329 Major depressive disorder, single episode, unspecified: Secondary | ICD-10-CM

## 2013-07-31 DIAGNOSIS — B20 Human immunodeficiency virus [HIV] disease: Secondary | ICD-10-CM

## 2013-07-31 DIAGNOSIS — B9620 Unspecified Escherichia coli [E. coli] as the cause of diseases classified elsewhere: Secondary | ICD-10-CM

## 2013-07-31 DIAGNOSIS — N12 Tubulo-interstitial nephritis, not specified as acute or chronic: Secondary | ICD-10-CM

## 2013-07-31 DIAGNOSIS — O0001 Abdominal pregnancy with intrauterine pregnancy: Secondary | ICD-10-CM

## 2013-07-31 MED ORDER — RITONAVIR 100 MG PO TABS: 100.0000 mg | ORAL_TABLET | Freq: Every day | ORAL | Status: DC

## 2013-07-31 MED ORDER — ATAZANAVIR SULFATE 300 MG PO CAPS: 300.0000 mg | ORAL_CAPSULE | Freq: Every day | ORAL | Status: DC

## 2013-07-31 NOTE — Progress Notes (Signed)
   Subjective:    Patient ID: Tamara Briggs, female    DOB: Apr 24, 1986, 27 y.o.   MRN: [id number]  HPI 27yo F with HIV, CD 300/VL 268 (oct 2014),tivicay/truvada taking meds regularly. She also takes latuda nad zoloft for   She was seen at end of Nov for abdominal pain, found to have ecoli pyelonephritis but also gestation was : 6 wk/6 day on [place]. Now at [redacted] wks Gestation. She is G3P1011 and interested in keeping her pregnancy.  Current Outpatient Prescriptions on File Prior to Visit  Medication Sig Dispense Refill  . cephALEXin (KEFLEX) 500 MG capsule Take 1 capsule (500 mg total) by mouth 4 (four) times daily.  40 capsule  0  . dolutegravir (TIVICAY) 50 MG tablet Take 1 tablet (50 mg total) by mouth daily.  30 tablet  11  . emtricitabine-tenofovir (TRUVADA) 200-300 MG per tablet Take 1 tablet by mouth daily.  30 tablet  11  . HYDROcodone-acetaminophen (NORCO/VICODIN) 5-325 MG per tablet Take 1 tablet by mouth every 6 (six) hours as needed for moderate pain.  65 tablet  0  . lurasidone (LATUDA) 40 MG TABS Take 40 mg by mouth daily.       . sertraline (ZOLOFT) 50 MG tablet Take 50 mg by mouth daily.       No current facility-administered medications on file prior to visit.       Review of Systems Doing well. No nausea, no abdominal pain. Eating well.  12 point ROS is negative    Objective:   Physical Exam BP 123/70  Pulse 88  Temp(Src) 97.9 F (36.6 C) (Oral)  Wt 100 lb (45.36 kg)  LMP 02/26/2013 Physical Exam  Constitutional: oriented to person, place, and time. appears well-developed and well-nourished. No distress.  HENT:  Mouth/Throat: Oropharynx is clear and moist. No oropharyngeal exudate.  Cardiovascular: Normal rate, regular rhythm and normal heart sounds. Exam reveals no gallop and no friction rub.  No murmur heard.  Pulmonary/Chest: Effort normal and breath sounds normal. No respiratory distress.  no wheezes.  Abdominal: Soft. Bowel sounds are normal.  exhibits no  distension. There is no tenderness.  Lymphadenopathy:  no cervical adenopathy.  Neurological: alert and oriented to person, place, and time.  Skin: Skin is warm and dry. No rash noted. No erythema.  Psychiatric:  a normal mood and affect. behavior is normal.          Assessment & Plan:  HIV and pregnancy = will change her regimen to truvada-boosted atazanavir. Will plan on Increasing  ATV dose during second trimester  Pregnancy = will check rubella titer in 2 wks when we do blood work. And set up appt with ob. Getting assistance to do Emergency medicaid pregnancy application this week. Will start her on prenatals and folic acid supplementation  ecoli pyelo = continue with keflex until completed.   Depression = we will decrease latuda dose due to interaction with atazanavir. Decrease to 20mg  daily. Can keep zoloft.  rtc in 2wk

## 2013-08-04 ENCOUNTER — Encounter: Payer: Self-pay | Admitting: *Deleted

## 2013-08-09 ENCOUNTER — Telehealth: Payer: Self-pay | Admitting: *Deleted

## 2013-08-09 NOTE — Telephone Encounter (Signed)
Called the [facility] to schedule the patient for an appt. Was advised that they work out of [employer] and schedule their own appts and if the patient has not been scheduled it is just that they have not gotten to them. She advised they are scheduling for February right now and they will call the patient once they get to her referral as they are first come first serve with appts. I asked that they call me once they get to her referral as it is hard to get in touch with the patient and her case manager is on site and we can coordinated the appt to see that she makes it there. Will let the doctor know and Mitch her case manager also.

## 2013-08-14 ENCOUNTER — Ambulatory Visit: Payer: Self-pay | Admitting: Internal Medicine

## 2013-08-22 ENCOUNTER — Telehealth: Payer: Self-pay | Admitting: *Deleted

## 2013-08-22 NOTE — Telephone Encounter (Signed)
Called the patient to try and schedule an appt for her to come to clinic as she is still detectable and she is pregnant. Was unable to reach her or leave a message will try her later.

## 2013-08-29 ENCOUNTER — Telehealth (HOSPITAL_COMMUNITY): Payer: Self-pay | Admitting: *Deleted

## 2013-08-30 ENCOUNTER — Other Ambulatory Visit: Payer: Self-pay | Admitting: *Deleted

## 2013-08-30 DIAGNOSIS — R627 Adult failure to thrive: Secondary | ICD-10-CM

## 2013-08-30 MED ORDER — ENSURE PO LIQD: 237.0000 mL | Freq: Two times a day (BID) | ORAL | Status: DC

## 2013-08-31 ENCOUNTER — Other Ambulatory Visit: Payer: Self-pay | Admitting: Internal Medicine

## 2013-09-07 ENCOUNTER — Other Ambulatory Visit: Payer: Self-pay | Admitting: Internal Medicine

## 2013-09-21 ENCOUNTER — Ambulatory Visit: Payer: Self-pay

## 2013-09-28 ENCOUNTER — Other Ambulatory Visit: Payer: Self-pay | Admitting: Internal Medicine

## 2013-10-12 ENCOUNTER — Ambulatory Visit: Payer: Self-pay | Admitting: Internal Medicine

## 2013-10-18 ENCOUNTER — Encounter: Payer: Self-pay | Admitting: Obstetrics and Gynecology

## 2013-10-18 ENCOUNTER — Ambulatory Visit (INDEPENDENT_AMBULATORY_CARE_PROVIDER_SITE_OTHER): Payer: Self-pay | Admitting: Obstetrics and Gynecology

## 2013-10-18 VITALS — BP 103/61 | Temp 98.6°F | Wt 107.2 lb

## 2013-10-18 DIAGNOSIS — O280 Abnormal hematological finding on antenatal screening of mother: Secondary | ICD-10-CM

## 2013-10-18 DIAGNOSIS — O98519 Other viral diseases complicating pregnancy, unspecified trimester: Secondary | ICD-10-CM

## 2013-10-18 DIAGNOSIS — F209 Schizophrenia, unspecified: Secondary | ICD-10-CM | POA: Insufficient documentation

## 2013-10-18 DIAGNOSIS — O98712 Human immunodeficiency virus [HIV] disease complicating pregnancy, second trimester: Secondary | ICD-10-CM

## 2013-10-18 DIAGNOSIS — F1410 Cocaine abuse, uncomplicated: Secondary | ICD-10-CM

## 2013-10-18 DIAGNOSIS — O289 Unspecified abnormal findings on antenatal screening of mother: Secondary | ICD-10-CM

## 2013-10-18 DIAGNOSIS — Z21 Asymptomatic human immunodeficiency virus [HIV] infection status: Secondary | ICD-10-CM

## 2013-10-18 DIAGNOSIS — E639 Nutritional deficiency, unspecified: Secondary | ICD-10-CM

## 2013-10-18 DIAGNOSIS — O09899 Supervision of other high risk pregnancies, unspecified trimester: Secondary | ICD-10-CM | POA: Insufficient documentation

## 2013-10-18 LAB — POCT URINALYSIS DIP (DEVICE)
BILIRUBIN URINE: NEGATIVE
Glucose, UA: NEGATIVE mg/dL
HGB URINE DIPSTICK: NEGATIVE
Ketones, ur: NEGATIVE mg/dL
NITRITE: NEGATIVE
PH: 7.5 (ref 5.0–8.0)
Protein, ur: 30 mg/dL — AB
SPECIFIC GRAVITY, URINE: 1.025 (ref 1.005–1.030)
Urobilinogen, UA: 0.2 mg/dL (ref 0.0–1.0)

## 2013-10-18 MED ORDER — PRENATAL VITAMINS 0.8 MG PO TABS
1.0000 | ORAL_TABLET | Freq: Every day | ORAL | Status: DC
Start: 2013-10-18 — End: 2013-10-26

## 2013-10-18 NOTE — Patient Instructions (Signed)
Second Trimester of Pregnancy °The second trimester is from week 13 through week 28, month 4 through 6. This is often the time in pregnancy that you feel your best. Often times, morning sickness has lessened or quit. You may have more energy, and you may get hungry more often. Your unborn baby (fetus) is growing rapidly. At the end of the sixth month, he or she is about 9 inches long and weighs about 1½ pounds. You will likely feel the baby move (quickening) between 18 and 20 weeks of pregnancy. °HOME CARE  °· Avoid all smoking, herbs, and alcohol. Avoid drugs not approved by your doctor. °· Only take medicine as told by your doctor. Some medicines are safe and some are not during pregnancy. °· Exercise only as told by your doctor. Stop exercising if you start having cramps. °· Eat regular, healthy meals. °· Wear a good support bra if your breasts are tender. °· Do not use hot tubs, steam rooms, or saunas. °· Wear your seat belt when driving. °· Avoid raw meat, uncooked cheese, and liter boxes and soil used by cats. °· Take your prenatal vitamins. °· Try taking medicine that helps you poop (stool softener) as needed, and if your doctor approves. Eat more fiber by eating fresh fruit, vegetables, and whole grains. Drink enough fluids to keep your pee (urine) clear or pale yellow. °· Take warm water baths (sitz baths) to soothe pain or discomfort caused by hemorrhoids. Use hemorrhoid cream if your doctor approves. °· If you have puffy, bulging veins (varicose veins), wear support hose. Raise (elevate) your feet for 15 minutes, 3 4 times a day. Limit salt in your diet. °· Avoid heavy lifting, wear low heals, and sit up straight. °· Rest with your legs raised if you have leg cramps or low back pain. °· Visit your dentist if you have not gone during your pregnancy. Use a soft toothbrush to brush your teeth. Be gentle when you floss. °· You can have sex (intercourse) unless your doctor tells you not to. °· Go to your  doctor visits. °GET HELP IF:  °· You feel dizzy. °· You have mild cramps or pressure in your lower belly (abdomen). °· You have a nagging pain in your belly area. °· You continue to feel sick to your stomach (nauseous), throw up (vomit), or have watery poop (diarrhea). °· You have bad smelling fluid coming from your vagina. °· You have pain with peeing (urination). °GET HELP RIGHT AWAY IF:  °· You have a fever. °· You are leaking fluid from your vagina. °· You have spotting or bleeding from your vagina. °· You have severe belly cramping or pain. °· You lose or gain weight rapidly. °· You have trouble catching your breath and have chest pain. °· You notice sudden or extreme puffiness (swelling) of your face, hands, ankles, feet, or legs. °· You have not felt the baby move in over an hour. °· You have severe headaches that do not go away with medicine. °· You have vision changes. °Document Released: 11/04/2009 Document Revised: 12/05/2012 Document Reviewed: 10/11/2012 °Exit[employer]® Patient Information ©2014 [employer]. ° °

## 2013-10-18 NOTE — Progress Notes (Signed)
Subjective:    Tamara Briggs is a G3P1011 [id number] being seen today for her first obstetrical visit.  Her obstetrical history is significant for NSVD at 38 wks. Patient does not intend to breast feed. Pregnancy history fully reviewed. Followed by ID MD for her HIV and schizophrenia. Hx substance abuse, states not using.  Patient reports no complaints.   Filed Vitals:   10/18/13 1327  BP: 103/61  Temp: 98.6 F (37 C)  Weight: 107 lb 3.2 oz (48.626 kg)    HISTORY: OB History  Gravida Para Term Preterm AB SAB TAB Ectopic Multiple Living  3 1 0 1 1 1 0 0 0 1     # Outcome Date GA Lbr Len/2nd Weight Sex Delivery Anes PTL Lv  3 CUR           2 PRE 04/10/08   5 lb 14 oz (2.665 kg) F SVD None  Y     Comments: "delivery 2 weeks early at 8 months"  1 SAB              Past Medical History  Diagnosis Date  . Crack cocaine use   . Anxiety   . Depression   . Schizophrenia   . Bipolar disorder   . 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 Otitis media of left ear 03/15/2013   Past Surgical History  Procedure Laterality Date  . Colposcopy  2009   Family History  Problem Relation Age of Onset  . Diabetes Mother      Exam    Uterus:   S=D, DT 144  Pelvic Exam:    Perineum: Normal Perineum   Vulva: normal, Bartholin's, Urethra, Skene's normal   Vagina:  normal mucosa, normal discharge       Cervix: multiparous appearance   Adnexa: not evaluated   Bony Pelvis: average  System: Breast:  normal appearance, no masses or tenderness   Skin: normal coloration and turgor, no rashes    Neurologic: oriented, normal, grossly non-focal   Extremities: normal strength, tone, and muscle mass   HEENT PERRLA   Mouth/Teeth mucous membranes moist, pharynx normal without lesions   Neck supple and no masses   Cardiovascular: regular rate and rhythm, no murmurs or gallops   Respiratory:  appears well, vitals normal, no respiratory distress, acyanotic, normal RR, ear and throat exam is normal, neck free of mass  or lymphadenopathy, chest clear, no wheezing, crepitations, rhonchi, normal symmetric air entry   Abdomen: NT 18 cm FH   Urinary: urethral meatus normal      Assessment:    Pregnancy: [id number] Patient Active Problem List   Diagnosis Date Noted  . Maternal HIV positive complicating pregnancy in second trimester, antepartum 10/18/2013  . Schizophrenia 10/18/2013  . Undernutrition 10/18/2013  . Pyelonephritis 07/21/2013  . Hyponatremia 07/21/2013  . Hypokalemia 07/21/2013  . Protein-calorie malnutrition, severe 03/16/2013  . Acute otitis externa of left ear 03/15/2013  . acute otitis media 03/15/2013  . Tobacco abuse 03/15/2013  . h/o crack Cocaine abuse 03/15/2013  . h/o oral Thrush 03/15/2013  . Gonorrhea 03/15/2013  . Acute sinusitis 03/15/2013  . HIV (human immunodeficiency virus infection) 07/27/2011  . Chlamydia 07/27/2011  . H/O abnormal cervical Papanicolaou smear 07/26/2008        Plan:     Initial labs drawn. Pap, GC/CT sent Prenatal vitamins. Problem list reviewed and updated. Genetic Screening discussed Quad Screen: ordered.  Ultrasound discussed; fetal survey: ordered.  Follow up in 4 weeks. 50% of 30  min visit spent on counseling and coordination of care.  F/U with ID 10/26/13. Continue antiretroviral regimen   Stephano Arrants 10/18/2013

## 2013-10-18 NOTE — Progress Notes (Signed)
Pulse: 85

## 2013-10-19 LAB — GLUCOSE TOLERANCE, 1 HOUR (50G) W/O FASTING: Glucose, 1 Hour GTT: 84 mg/dL (ref 70–140)

## 2013-10-20 LAB — BENZODIAZEPINES (GC/LC/MS), URINE
Alprazolam (GC/LC/MS), ur confirm: NEGATIVE ng/mL
Alprazolam metabolite (GC/LC/MS), ur confirm: NEGATIVE ng/mL
CLONAZEPAU: NEGATIVE ng/mL
DIAZEPAMUC: NEGATIVE ng/mL
Estazolam (GC/LC/MS), ur confirm: NEGATIVE ng/mL
FLURAZEPAMU: NEGATIVE ng/mL
Flunitrazepam metabolite (GC/LC/MS), ur confirm: NEGATIVE ng/mL
LORAZEPAMU: NEGATIVE ng/mL
Midazolam (GC/LC/MS), ur confirm: NEGATIVE ng/mL
Nordiazepam (GC/LC/MS), ur confirm: NEGATIVE ng/mL
Oxazepam (GC/LC/MS), ur confirm: NEGATIVE ng/mL
TRIAZOLAMU: NEGATIVE ng/mL
Temazepam (GC/LC/MS), ur confirm: NEGATIVE ng/mL

## 2013-10-20 LAB — OBSTETRIC PANEL
Antibody Screen: NEGATIVE
Basophils Absolute: 0 10*3/uL (ref 0.0–0.1)
Basophils Relative: 0 % (ref 0–1)
Eosinophils Absolute: 0.2 10*3/uL (ref 0.0–0.7)
Eosinophils Relative: 3 % (ref 0–5)
HCT: 33 % — ABNORMAL LOW (ref 36.0–46.0)
HEP B S AG: NEGATIVE
Hemoglobin: 11.1 g/dL — ABNORMAL LOW (ref 12.0–15.0)
LYMPHS ABS: 1.1 10*3/uL (ref 0.7–4.0)
LYMPHS PCT: 21 % (ref 12–46)
MCH: 28 pg (ref 26.0–34.0)
MCHC: 33.6 g/dL (ref 30.0–36.0)
MCV: 83.1 fL (ref 78.0–100.0)
Monocytes Absolute: 0.4 10*3/uL (ref 0.1–1.0)
Monocytes Relative: 8 % (ref 3–12)
NEUTROS PCT: 68 % (ref 43–77)
Neutro Abs: 3.4 10*3/uL (ref 1.7–7.7)
PLATELETS: 196 10*3/uL (ref 150–400)
RBC: 3.97 MIL/uL (ref 3.87–5.11)
RDW: 14.9 % (ref 11.5–15.5)
RUBELLA: 0.82 {index} (ref <0.90)
Rh Type: POSITIVE
WBC: 5 10*3/uL (ref 4.0–10.5)

## 2013-10-20 LAB — PRESCRIPTION MONITORING PROFILE (19 PANEL)
Amphetamine/Meth: NEGATIVE ng/mL
BARBITURATE SCREEN, URINE: NEGATIVE ng/mL
Buprenorphine, Urine: NEGATIVE ng/mL
Cannabinoid Scrn, Ur: NEGATIVE ng/mL
Carisoprodol, Urine: NEGATIVE ng/mL
Creatinine, Urine: 187.16 mg/dL (ref >20.0)
ECSTASY: NEGATIVE ng/mL
Fentanyl, Ur: NEGATIVE ng/mL
MEPERIDINE UR: NEGATIVE ng/mL
METHAQUALONE SCREEN (URINE): NEGATIVE ng/mL
Methadone Screen, Urine: NEGATIVE ng/mL
Nitrites, Initial: NEGATIVE ug/mL
OPIATE SCREEN, URINE: NEGATIVE ng/mL
Oxycodone Screen, Ur: NEGATIVE ng/mL
Phencyclidine, Ur: NEGATIVE ng/mL
Propoxyphene: NEGATIVE ng/mL
Tapentadol, urine: NEGATIVE ng/mL
Tramadol Scrn, Ur: NEGATIVE ng/mL
Zolpidem, Urine: NEGATIVE ng/mL
pH, Initial: 7.9 pH (ref 4.5–8.9)

## 2013-10-20 LAB — COCAINE METABOLITE (GC/LC/MS), URINE: Benzoylecgonine GC/MS Conf: 3712 ng/mL — AB

## 2013-10-21 LAB — CULTURE, OB URINE: Colony Count: 35000

## 2013-10-23 ENCOUNTER — Telehealth: Payer: Self-pay | Admitting: General Practice

## 2013-10-23 DIAGNOSIS — O98819 Other maternal infectious and parasitic diseases complicating pregnancy, unspecified trimester: Principal | ICD-10-CM

## 2013-10-23 DIAGNOSIS — A749 Chlamydial infection, unspecified: Secondary | ICD-10-CM

## 2013-10-23 LAB — HEMOGLOBINOPATHY EVALUATION
HEMOGLOBIN OTHER: 0 %
Hgb A2 Quant: 2.8 % (ref 2.2–3.2)
Hgb A: 96.4 % — ABNORMAL LOW (ref 96.8–97.8)
Hgb F Quant: 0.8 % (ref 0.0–2.0)
Hgb S Quant: 0 %

## 2013-10-23 MED ORDER — AZITHROMYCIN 250 MG PO TABS: 1000.0000 mg | ORAL_TABLET | Freq: Once | ORAL | Status: DC

## 2013-10-23 NOTE — Telephone Encounter (Signed)
Per chart review, patient for + chlamydia from pap. Med ordered and STD card sent. Called patient and message stated this person is unavailable right now- unable to leave message

## 2013-10-24 LAB — AFP, QUAD SCREEN
AFP: 149 IU/mL
Curr Gest Age: 19.4 wks.days
Down Syndrome Scr Risk Est: <1:38500 {titer}
HCG TOTAL: 18097 m[IU]/mL
INH: 184.1 pg/mL
Interpretation-AFP: NEGATIVE
MOM FOR AFP: 2.71
MOM FOR HCG: 1.02
MOM FOR INH: 0.83
Open Spina bifida: POSITIVE — AB
TRI 18 SCR RISK EST: NEGATIVE
Trisomy 18 (Edward) Syndrome Interp.: 1:137000 {titer}
uE3 Mom: 1.27
uE3 Value: 1.5 ng/mL

## 2013-10-24 NOTE — Telephone Encounter (Signed)
Attempted to call pt. Tamara PatchHeard "the person you are trying to cal is unavailable right now. Please try your call again later." Unable to leave message.

## 2013-10-26 ENCOUNTER — Encounter: Payer: Self-pay | Admitting: Internal Medicine

## 2013-10-26 ENCOUNTER — Ambulatory Visit (INDEPENDENT_AMBULATORY_CARE_PROVIDER_SITE_OTHER): Payer: Medicaid Other | Admitting: Internal Medicine

## 2013-10-26 VITALS — BP 108/63 | HR 75 | Temp 98.2°F | Wt 109.0 lb

## 2013-10-26 DIAGNOSIS — O98519 Other viral diseases complicating pregnancy, unspecified trimester: Secondary | ICD-10-CM

## 2013-10-26 DIAGNOSIS — O289 Unspecified abnormal findings on antenatal screening of mother: Secondary | ICD-10-CM

## 2013-10-26 DIAGNOSIS — O280 Abnormal hematological finding on antenatal screening of mother: Secondary | ICD-10-CM

## 2013-10-26 DIAGNOSIS — F209 Schizophrenia, unspecified: Secondary | ICD-10-CM

## 2013-10-26 DIAGNOSIS — Z21 Asymptomatic human immunodeficiency virus [HIV] infection status: Secondary | ICD-10-CM

## 2013-10-26 DIAGNOSIS — E639 Nutritional deficiency, unspecified: Secondary | ICD-10-CM

## 2013-10-26 DIAGNOSIS — F1410 Cocaine abuse, uncomplicated: Secondary | ICD-10-CM

## 2013-10-26 DIAGNOSIS — B20 Human immunodeficiency virus [HIV] disease: Secondary | ICD-10-CM

## 2013-10-26 DIAGNOSIS — O98712 Human immunodeficiency virus [HIV] disease complicating pregnancy, second trimester: Secondary | ICD-10-CM

## 2013-10-26 LAB — CBC WITH DIFFERENTIAL/PLATELET
Basophils Absolute: 0 10*3/uL (ref 0.0–0.1)
Basophils Relative: 0 % (ref 0–1)
EOS ABS: 0 10*3/uL (ref 0.0–0.7)
Eosinophils Relative: 1 % (ref 0–5)
HEMATOCRIT: 31.2 % — AB (ref 36.0–46.0)
Hemoglobin: 10.9 g/dL — ABNORMAL LOW (ref 12.0–15.0)
Lymphocytes Relative: 22 % (ref 12–46)
Lymphs Abs: 1 10*3/uL (ref 0.7–4.0)
MCH: 28.9 pg (ref 26.0–34.0)
MCHC: 34.9 g/dL (ref 30.0–36.0)
MCV: 82.8 fL (ref 78.0–100.0)
Monocytes Absolute: 0.6 10*3/uL (ref 0.1–1.0)
Monocytes Relative: 14 % — ABNORMAL HIGH (ref 3–12)
Neutro Abs: 2.8 10*3/uL (ref 1.7–7.7)
Neutrophils Relative %: 63 % (ref 43–77)
PLATELETS: 187 10*3/uL (ref 150–400)
RBC: 3.77 MIL/uL — AB (ref 3.87–5.11)
RDW: 14.3 % (ref 11.5–15.5)
WBC: 4.4 10*3/uL (ref 4.0–10.5)

## 2013-10-26 LAB — COMPLETE METABOLIC PANEL WITH GFR
ALT: 11 U/L (ref 0–35)
AST: 17 U/L (ref 0–37)
Albumin: 3.4 g/dL — ABNORMAL LOW (ref 3.5–5.2)
Alkaline Phosphatase: 73 U/L (ref 39–117)
BILIRUBIN TOTAL: 0.2 mg/dL (ref 0.2–1.2)
BUN: 11 mg/dL (ref 6–23)
CO2: 26 mEq/L (ref 19–32)
Calcium: 8.2 mg/dL — ABNORMAL LOW (ref 8.4–10.5)
Chloride: 103 mEq/L (ref 96–112)
Creat: 0.58 mg/dL (ref 0.50–1.10)
GFR, Est Non African American: >89 mL/min
GLUCOSE: 61 mg/dL — AB (ref 70–99)
Potassium: 3.7 mEq/L (ref 3.5–5.3)
Sodium: 134 mEq/L — ABNORMAL LOW (ref 135–145)
Total Protein: 6.8 g/dL (ref 6.0–8.3)

## 2013-10-26 MED ORDER — ATAZANAVIR SULFATE 200 MG PO CAPS: 400.0000 mg | ORAL_CAPSULE | Freq: Every day | ORAL | Status: DC

## 2013-10-26 MED ORDER — PRENATAL VITAMINS 0.8 MG PO TABS: 1.0000 | ORAL_TABLET | Freq: Every day | ORAL | Status: DC

## 2013-10-26 NOTE — Telephone Encounter (Signed)
Notice that patient has an appt with ID at 1015 called ID and spoke with Angelique Blonderenise who informed pt of her appt with MFM tomorrow, 10/27/13, at 1000 am and that they will explain her results at the appt.  I also informed Angelique BlonderDenise that pt needed to go to her pharmacy to pick up medication for tx and Angelique BlonderDenise informed me that she would have the patient treated for + chlamydia at her appt today with ID and that she would call the pt's pharmacy to cancel the Rx.

## 2013-10-26 NOTE — Telephone Encounter (Signed)
Called pt and and unable to leave message.

## 2013-10-26 NOTE — Progress Notes (Signed)
Subjective:    Patient ID: Keyla Milone, female    DOB: 03/15/1986, 28 y.o.   MRN: [id number]  HPI 28 yo F with HIV - schizophrenia but also pregnant gestational week / 5months. She was seen in women's clinic at end of feb, had pap + chlamydia, + drug screen. She has taken azithro today in clinic. She is doing well with hiv meds of truvada/ATVr except that she has noticed truvada difficult to swallow. She also has pills prenatals which are difficult to swallow, wants to know if can get gummie vitamin.  She is here with her partner, bobby who is father of child. Not feeling excessively sleepy.  Current Outpatient Prescriptions on File Prior to Visit  Medication Sig Dispense Refill  . atazanavir (REYATAZ) 300 MG capsule Take 1 capsule (300 mg total) by mouth daily with breakfast.  30 capsule  11  . azithromycin (ZITHROMAX) 250 MG tablet Take 4 tablets (1,000 mg total) by mouth once.  4 tablet  0  . emtricitabine-tenofovir (TRUVADA) 200-300 MG per tablet Take 1 tablet by mouth daily.  30 tablet  11  . ENSURE (ENSURE) Take 237 mLs by mouth 2 (two) times daily between meals.  237 mL  11  . Prenatal Multivit-Min-Fe-FA (PRENATAL VITAMINS) 0.8 MG tablet Take 1 tablet by mouth daily.  30 tablet  12  . ritonavir (NORVIR) 100 MG TABS tablet Take 1 tablet (100 mg total) by mouth daily.  30 tablet  11  . sertraline (ZOLOFT) 50 MG tablet Take 50 mg by mouth daily.       No current facility-administered medications on file prior to visit.   Active Ambulatory Problems    Diagnosis Date Noted  . HIV (human immunodeficiency virus infection) 07/27/2011  . Chlamydia 07/27/2011  . H/O abnormal cervical Papanicolaou smear 07/26/2008  . Acute otitis externa of left ear 03/15/2013  . acute otitis media 03/15/2013  . Tobacco abuse 03/15/2013  . h/o crack Cocaine abuse 03/15/2013  . h/o oral Thrush 03/15/2013  . Gonorrhea 03/15/2013  . Acute sinusitis 03/15/2013  . Protein-calorie malnutrition, severe 03/16/2013   . Pyelonephritis 07/21/2013  . Hyponatremia 07/21/2013  . Hypokalemia 07/21/2013  . Maternal HIV positive complicating pregnancy in second trimester, antepartum 10/18/2013  . Schizophrenia 10/18/2013  . Undernutrition 10/18/2013  . Abnormal antenatal AFP screen 10/26/2013   Resolved Ambulatory Problems    Diagnosis Date Noted  . Underweight 07/27/2011  . Unspecified protein-calorie malnutrition 03/15/2013   Past Medical History  Diagnosis Date  . Crack cocaine use   . Anxiety   . Depression   . Bipolar disorder   . 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 Otitis media of left ear 03/15/2013       Review of Systems 10 point ROS is negative    Objective:   Physical Exam BP 108/63  Pulse 75  Temp(Src) 98.2 F (36.8 C) (Oral)  Wt 109 lb (49.442 kg)  LMP 02/26/2013 gne = a xo  by3 in NAD HEENT = EOMI, PERRLA, no scleral icterus, poor dentition, caries and cracked tooth noted to right lower 1st molar but also has mildly tender Lump on jaw right. Pulm= ctab Cors = nl s1,s2 Abd= gravid Ext = no c/c/e      Assessment & Plan:  hiv = increase atv to 400mg ; continue ritonavir 100mg  daiy; take truvada with apple sauce. Will check labs today  Pregnancy = will give rx for chewable prenatal vitamins; continue with good eating habits. She has appt on  3/6 for high risk pregnancy clinic and ultrasounds  Chlamydia = treated today. As well as partner  Smoking cessation = is willing to quit since she is pregnant . admits to smoking 2-3 cigs per day. Also willing to quit all other substance use  rtc 4 wk  Schizophrenia = latuda dosage has been decreased to 20mg  daily due to interaction with ritonavir.  hiv prevention = remember condom use. Partner tested today negative today

## 2013-10-26 NOTE — Progress Notes (Signed)
[facility] - Pharmacist    HPI: Tamara Briggs is a 28 y.o. female who presents today for a follow-up for HIV infection. She is also pregnant and has a past medical history significant for schizophrenia. Tamara Briggs was also found to positive for chlamydia via pap and was given azithromycin today in clinic.   Allergies: No Known Allergies  Vitals: Temp: 98.2 F (36.8 C) (03/05 1051) Temp src: Oral (03/05 1051) BP: 108/63 mmHg (03/05 1051) Pulse Rate: 75 (03/05 1051)  Past Medical History: Past Medical History  Diagnosis Date  . Crack cocaine use   . Anxiety   . Depression   . Schizophrenia   . Bipolar disorder   . 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 Otitis media of left ear 03/15/2013    Social History: History   Social History  . Marital Status: Single    Spouse Name: N/A    Number of Children: N/A  . Years of Education: N/A   Social History Main Topics  . Smoking status: Current Every Day Smoker -- 0.50 packs/day for 3 years    Types: Cigarettes  . Smokeless tobacco: Never Used     Comment: cutting back  . Alcohol Use: No  . Drug Use: Yes    Special: Cocaine     Comment: two days ago   . Sexual Activity: Not Currently    Partners: Male    Birth Control/ Protection: None     Comment: pt declined condoms   Other Topics Concern  . None   Social History Narrative  . None    Current Regimen: atazanavir 300 mg PO daily Ritonavir 100 mg PO daily Truvada 1 tablet PO daily  Labs: HIV 1 RNA Quant (copies/mL)  Date Value  04/27/2013 268*  03/28/2013 901*  03/07/2013 [id number]*     CD4 T Cell Abs (/uL)  Date Value  04/27/2013 300*  03/28/2013 480   03/07/2013 50*     Hep B S Ab (no units)  Date Value  07/09/2011 POS*     Hepatitis B Surface Ag (no units)  Date Value  10/18/2013 NEGATIVE      HCV Ab (no units)  Date Value  07/09/2011 NEGATIVE     Lipids:    Component Value Date/Time   CHOL 129 07/26/2012 1624   TRIG 81 07/26/2012 1624    HDL 43 07/26/2012 1624   CHOLHDL 3.0 07/26/2012 1624   VLDL 16 07/26/2012 1624   LDLCALC 70 07/26/2012 1624    Assessment: Ms. Weniger's HIV infection is better controlled than it has been previously. Her most recent CD4 count is 300, and her viral load is 268. She admits to occasionally missing doses (~1 time within this past week) as the Truvada is difficult for her to swallow. We discussed potential remedies for this including drinking a big glass of water or taking it with soft foods such as applesauce, pudding, etc.   Of note, patient is on Zoloft and Latuda for her schizophrenia. The metabolism of Latuda can be inhibited by strong CYP3A4 inhibitors, such as atazanavir. This was discussed with the patient who said her psychiatrist has already decreased her Latuda dose due to this. Signs of toxicity should be continued to be monitored, especially since the dose of atazanavir will be increased to 400 mg after today's visit as the patient is in her second trimester.  Tamara Briggs also reports cigarette smoking, though she has been able to get it down to ~2-3 cigarettes/day.  The importance of abstaining from smoking, especially during pregnancy, was discussed.  Recommendations: Continue current HIV medications Change to chewable prenatal vitamins F/u adherence/swallowing difficulty at next visit  Tehila Sokolow C. Gudrun Axe, PharmD Clinical Pharmacist-Resident Pager: [phone] 10/26/2013 1:55 PM

## 2013-10-27 ENCOUNTER — Ambulatory Visit (HOSPITAL_COMMUNITY)
Admission: RE | Admit: 2013-10-27 | Discharge: 2013-10-27 | Disposition: A | Discharge status: Home / Self Care | Payer: Medicaid Other | Source: Ambulatory Visit | Attending: Obstetrics and Gynecology | Admitting: Obstetrics and Gynecology

## 2013-10-27 ENCOUNTER — Other Ambulatory Visit: Payer: Self-pay | Admitting: Obstetrics and Gynecology

## 2013-10-27 ENCOUNTER — Ambulatory Visit (HOSPITAL_COMMUNITY): Payer: Medicaid Other

## 2013-10-27 ENCOUNTER — Ambulatory Visit (HOSPITAL_COMMUNITY): Admission: RE | Admit: 2013-10-27 | Payer: Medicaid Other | Source: Ambulatory Visit

## 2013-10-27 DIAGNOSIS — Z21 Asymptomatic human immunodeficiency virus [HIV] infection status: Principal | ICD-10-CM

## 2013-10-27 DIAGNOSIS — F1410 Cocaine abuse, uncomplicated: Secondary | ICD-10-CM | POA: Insufficient documentation

## 2013-10-27 DIAGNOSIS — O280 Abnormal hematological finding on antenatal screening of mother: Secondary | ICD-10-CM

## 2013-10-27 DIAGNOSIS — F1920 Other psychoactive substance dependence, uncomplicated: Secondary | ICD-10-CM | POA: Insufficient documentation

## 2013-10-27 DIAGNOSIS — O98712 Human immunodeficiency virus [HIV] disease complicating pregnancy, second trimester: Secondary | ICD-10-CM

## 2013-10-27 DIAGNOSIS — O358XX0 Maternal care for other (suspected) fetal abnormality and damage, not applicable or unspecified: Secondary | ICD-10-CM | POA: Insufficient documentation

## 2013-10-27 DIAGNOSIS — O289 Unspecified abnormal findings on antenatal screening of mother: Secondary | ICD-10-CM | POA: Insufficient documentation

## 2013-10-27 DIAGNOSIS — O3500X0 Maternal care for (suspected) central nervous system malformation or damage in fetus, unspecified, not applicable or unspecified: Secondary | ICD-10-CM | POA: Insufficient documentation

## 2013-10-27 DIAGNOSIS — O98519 Other viral diseases complicating pregnancy, unspecified trimester: Secondary | ICD-10-CM | POA: Insufficient documentation

## 2013-10-27 DIAGNOSIS — E639 Nutritional deficiency, unspecified: Secondary | ICD-10-CM

## 2013-10-27 DIAGNOSIS — O09219 Supervision of pregnancy with history of pre-term labor, unspecified trimester: Secondary | ICD-10-CM | POA: Insufficient documentation

## 2013-10-27 DIAGNOSIS — F209 Schizophrenia, unspecified: Secondary | ICD-10-CM

## 2013-10-27 DIAGNOSIS — O350XX0 Maternal care for (suspected) central nervous system malformation in fetus, not applicable or unspecified: Secondary | ICD-10-CM | POA: Insufficient documentation

## 2013-10-27 DIAGNOSIS — O99320 Drug use complicating pregnancy, unspecified trimester: Secondary | ICD-10-CM

## 2013-10-27 LAB — T-HELPER CELL (CD4) - (RCID CLINIC ONLY)
CD4 T CELL HELPER: 27 % — AB (ref 33–55)
CD4 T Cell Abs: 300 /uL — ABNORMAL LOW (ref 400–2700)

## 2013-10-27 LAB — HIV-1 RNA QUANT-NO REFLEX-BLD
HIV 1 RNA Quant: 142 copies/mL — ABNORMAL HIGH (ref <20)
HIV-1 RNA Quant, Log: 2.15 {Log} — ABNORMAL HIGH (ref <1.30)

## 2013-10-27 NOTE — Progress Notes (Signed)
Genetic Counseling  High-Risk Gestation Note  Appointment Date:  10/27/2013 Referred By: Danae Orleans, CNM Date of Birth:  1986-04-19 Partner:  Tamara Briggs   Pregnancy History: [id number] Estimated Date of Delivery: 03/10/14 Estimated Gestational Age: [redacted]w[redacted]d Attending: Particia Nearing, MD   Ms. Tamara Briggs and her partner, Mr. Tamara Briggs" Tamara Briggs, were seen for consultation for genetic counseling because of an elevated MSAFP of 2.71 MoMs based on maternal serum screening through [employer].    We reviewed Tamara Briggs's maternal serum screening result, the elevation of MSAFP, and the associated 1 in 159 risk for a fetal open neural tube defect.   We reviewed ONTDs, the typical multifactorial etiology, and variable prognosis.  In addition, we discussed additional explanations for an elevated MSAFP including: normal variation, twins, feto-maternal bleeding, a gestational dating error, abdominal wall defects, kidney differences, oligohydramnios, and placental problems.  We discussed that an unexplained elevation of MSAFP is associated with an increased risk for third trimester complications including: prematurity, low birth weight, and pre-eclampsia.    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  Medical records were not available to verify this history.  She reported that she has not had significant health concerns related to her NTD.  We discussed that spina bifida is one of the most common birth differences and affects approximately 1 in 600 babies each year in [place].  We reviewed that the spine contains nerves that control the legs, bladder, and bowel.  If there is an opening or a change in the development of the spine, the nerves can be damaged or incompletely formed.  We also discussed that babies with spina bifida can have a range of health concerns, which partially depends on where the opening is located along the spine.  Typically, openings  in the lower spine cause fewer health concerns than openings in the upper spine.  We discussed the many causes of NTDs.  They were counseled that spina bifida occurs as an isolated finding, in the majority of cases.  "Isolated" simply means that spina bifida is the only birth difference that happened in the baby.  Isolated NTDs are usually inherited in a multifactorial manner in which there is no prior family history.  Multifactorial conditions have both environmental and genetic factors that contribute to their development.  Both the genetic and environmental factors that contribute to the development of spina bifida are largely unknown; however, some medications and health conditions, such as uncontrolled diabetes and obesity, may increase the chance of spina bifida.  We also discussed the role of folic acid in the development of the neural tube and prevention of spina bifida.  We also discussed that NTDs may occur as a feature of an underlying genetic syndrome or condition.  Given this history, we discussed that the risk of an ONTD in the fetus is increased above the screen related risk (1 in 159) to at least 3-5%.  We reviewed additional available screening and diagnostic options including detailed ultrasound and amniocentesis.  We discussed the risks, limitations, and benefits of each.  After thoughtful consideration of these options, Tamara Briggs elected to have ultrasound, but declined amniocentesis.  She understands that ultrasound cannot rule out all birth defects or genetic syndromes.  However, she was counseled that 80-90% of fetuses with ONTDs can be detected by detailed 2nd trimester ultrasound, when well visualized.  A complete ultrasound was performed today.  The ultrasound report will be sent under  separate cover.  There were no markers suggestive of a fetal ONTD or anomalies detected today.  Amniocentesis was declined.  In addition, Tamara Briggs is 28 years old.  We discussed that advanced paternal  age (APA) is defined as paternal age greater than or equal to age 36.  Recent large-scale sequencing studies have shown that approximately 80% of de novo point mutations are of paternal origin.  Many studies have demonstrated a strong correlation between increased paternal age and de novo point mutations.  Although no specific data is available regarding fetal risks for fathers 91+ years old at conception, it is apparent that the overall risk for single gene conditions is increased.  To estimate the relative increase in risk of a genetic disorder with APA, the heritability of the disease must be considered.  Assuming an approximate 2x increase in risk for conditions that are exclusively paternal in origin, the risk for each individual condition is still relatively low.  It is estimated that the overall chance for a de novo mutation is ~0.5%.  We also discussed the wide range of conditions which can be caused by new dominant gene mutations (achondroplasia, neurofibromatosis, Marfan syndrome etc.).  They were counseled that genetic testing for each individual single gene condition is not warranted or available unless ultrasound or family history concerns lend suspicion to a specific condition.  We discussed the recommendation for a detailed ultrasound at 18+ weeks gestation and a follow up ultrasound at ~28 weeks to monitor fetal growth.  We also discussed that newer literature suggests that the risk for autism spectrum disorders (ASD) may be increased in children born to fathers of APA.  We discussed that ASDs are among the most common neurodevelopmental disorders, with approximately 1 in 85 children meeting criteria for ASD.  Approximately 80% of individuals diagnosed are female.  There is strong evidence that genetic factors play a critical role in development of ASD.  While there have been recent advances in identifying specific genetic causes of ASD, there are still many individuals for whom the etiology of the  ASD is not known.  They understand that at this time there is no reliable, comprehensive genetic testing available for ASD.     Tamara Briggs was provided with written information regarding sickle cell anemia (SCA) including the carrier frequency and incidence in the African American population, the availability of carrier testing and prenatal diagnosis if indicated.  In addition, we discussed that hemoglobinopathies are routinely screened for as part of the [place] newborn screening panel.  We reviewed Tamara Briggs's normal hemoglobin electrophoresis results.   Both family histories were reviewed and found to be noncontributory for birth defects, intellectual disability, and known genetic conditions.  Without further information regarding the provided family history, an accurate genetic risk cannot be calculated. Further genetic counseling is warranted if more information is obtained.  Tamara Briggs denied exposure to environmental toxins or chemical agents. She denied the use of alcohol and street drugs. Tamara Briggs has a complex medical history contributory for HIV and schizophrenia.  She reported that she is currently taking Zoloft and Latuda for management of her mental health disorder.  She is followed by ID for her HIV. Based on available study data of use on Zoloft during human pregnancy, no apparent association with increased risk of birth defects is suggested. Use of Zoloft and other serotonin reuptake inhibitors late in gestation has been associated with an increased risk of mild transient neonatal syndrome of central nervous system including irritability, vomiting,  jitteriness and convulsions, as well as neonatal pulmonary hypertension. These associated symptoms typically do not appear to occur frequently or severely.  We discussed that there is very limited information regarding use of Latuda during human pregnancy; however, animal data suggests that use of Latuda during pregnancy is not associated with an  increased risk for congenital anomalies.  I counseled this couple for approximately 49 minutes regarding the above risks and available options.   Tamara Prosehristy S. Djuan Talton, MS Certified Genetic Counselor

## 2013-11-02 ENCOUNTER — Encounter: Payer: Self-pay | Admitting: Obstetrics and Gynecology

## 2013-11-16 ENCOUNTER — Ambulatory Visit (INDEPENDENT_AMBULATORY_CARE_PROVIDER_SITE_OTHER): Payer: Medicaid Other | Admitting: Obstetrics & Gynecology

## 2013-11-16 VITALS — BP 117/67 | Temp 97.5°F | Wt 110.2 lb

## 2013-11-16 DIAGNOSIS — Z21 Asymptomatic human immunodeficiency virus [HIV] infection status: Secondary | ICD-10-CM

## 2013-11-16 DIAGNOSIS — O98712 Human immunodeficiency virus [HIV] disease complicating pregnancy, second trimester: Secondary | ICD-10-CM

## 2013-11-16 DIAGNOSIS — O98519 Other viral diseases complicating pregnancy, unspecified trimester: Secondary | ICD-10-CM

## 2013-11-16 DIAGNOSIS — Z87728 Personal history of other specified (corrected) congenital malformations of nervous system and sense organs: Secondary | ICD-10-CM

## 2013-11-16 DIAGNOSIS — O280 Abnormal hematological finding on antenatal screening of mother: Secondary | ICD-10-CM

## 2013-11-16 DIAGNOSIS — F1410 Cocaine abuse, uncomplicated: Secondary | ICD-10-CM

## 2013-11-16 DIAGNOSIS — R627 Adult failure to thrive: Secondary | ICD-10-CM

## 2013-11-16 DIAGNOSIS — O289 Unspecified abnormal findings on antenatal screening of mother: Secondary | ICD-10-CM

## 2013-11-16 HISTORY — DX: Personal history of other specified (corrected) congenital malformations of nervous system and sense organs: Z87.728

## 2013-11-16 LAB — POCT URINALYSIS DIP (DEVICE)
BILIRUBIN URINE: NEGATIVE
Glucose, UA: 250 mg/dL — AB
HGB URINE DIPSTICK: NEGATIVE
KETONES UR: NEGATIVE mg/dL
NITRITE: NEGATIVE
PH: 6 (ref 5.0–8.0)
Protein, ur: 30 mg/dL — AB
Specific Gravity, Urine: 1.02 (ref 1.005–1.030)
Urobilinogen, UA: 0.2 mg/dL (ref 0.0–1.0)

## 2013-11-16 MED ORDER — CETIRIZINE-PSEUDOEPHEDRINE ER 5-120 MG PO TB12: 1.0000 | ORAL_TABLET | Freq: Two times a day (BID) | ORAL | Status: DC

## 2013-11-16 MED ORDER — ENSURE PO LIQD: 237.0000 mL | Freq: Two times a day (BID) | ORAL | Status: DC

## 2013-11-16 NOTE — Progress Notes (Signed)
Pulse: 108

## 2013-11-16 NOTE — Progress Notes (Signed)
Pt having seasonal allergies.  Recommend zyrtec D.  Pt prescribed ensure and meets with nutrition.  Rpt UDs done (has + uds for cocaine).  Pt does not have custody of first child.  Met with MFM for elevated AFP.  [place] nml.

## 2013-11-29 ENCOUNTER — Other Ambulatory Visit: Payer: Self-pay | Admitting: Family Medicine

## 2013-11-29 DIAGNOSIS — F1920 Other psychoactive substance dependence, uncomplicated: Secondary | ICD-10-CM

## 2013-11-29 DIAGNOSIS — O99320 Drug use complicating pregnancy, unspecified trimester: Secondary | ICD-10-CM

## 2013-11-29 DIAGNOSIS — O09219 Supervision of pregnancy with history of pre-term labor, unspecified trimester: Secondary | ICD-10-CM

## 2013-11-29 DIAGNOSIS — O358XX0 Maternal care for other (suspected) fetal abnormality and damage, not applicable or unspecified: Secondary | ICD-10-CM

## 2013-11-29 DIAGNOSIS — O3500X0 Maternal care for (suspected) central nervous system malformation or damage in fetus, unspecified, not applicable or unspecified: Secondary | ICD-10-CM

## 2013-11-29 DIAGNOSIS — O350XX0 Maternal care for (suspected) central nervous system malformation in fetus, not applicable or unspecified: Secondary | ICD-10-CM

## 2013-11-29 DIAGNOSIS — O98519 Other viral diseases complicating pregnancy, unspecified trimester: Secondary | ICD-10-CM

## 2013-12-08 ENCOUNTER — Ambulatory Visit (HOSPITAL_COMMUNITY): Payer: Medicaid Other | Attending: Obstetrics and Gynecology

## 2013-12-12 ENCOUNTER — Ambulatory Visit (INDEPENDENT_AMBULATORY_CARE_PROVIDER_SITE_OTHER): Payer: Medicaid Other | Admitting: Internal Medicine

## 2013-12-12 ENCOUNTER — Encounter: Payer: Self-pay | Admitting: Internal Medicine

## 2013-12-12 VITALS — BP 102/68 | HR 96 | Temp 98.7°F | Wt 114.0 lb

## 2013-12-12 DIAGNOSIS — B20 Human immunodeficiency virus [HIV] disease: Secondary | ICD-10-CM

## 2013-12-12 LAB — COMPLETE METABOLIC PANEL WITH GFR
ALBUMIN: 3.1 g/dL — AB (ref 3.5–5.2)
ALT: 10 U/L (ref 0–35)
AST: 18 U/L (ref 0–37)
Alkaline Phosphatase: 97 U/L (ref 39–117)
BUN: 7 mg/dL (ref 6–23)
CALCIUM: 8.2 mg/dL — AB (ref 8.4–10.5)
CHLORIDE: 102 meq/L (ref 96–112)
CO2: 24 meq/L (ref 19–32)
Creat: 0.46 mg/dL — ABNORMAL LOW (ref 0.50–1.10)
Glucose, Bld: 75 mg/dL (ref 70–99)
POTASSIUM: 3.8 meq/L (ref 3.5–5.3)
SODIUM: 134 meq/L — AB (ref 135–145)
TOTAL PROTEIN: 6.8 g/dL (ref 6.0–8.3)
Total Bilirubin: 0.3 mg/dL (ref 0.2–1.2)

## 2013-12-12 LAB — CBC WITH DIFFERENTIAL/PLATELET
Basophils Absolute: 0 10*3/uL (ref 0.0–0.1)
Basophils Relative: 0 % (ref 0–1)
Eosinophils Absolute: 0.2 10*3/uL (ref 0.0–0.7)
Eosinophils Relative: 3 % (ref 0–5)
HCT: 31.3 % — ABNORMAL LOW (ref 36.0–46.0)
Hemoglobin: 10.9 g/dL — ABNORMAL LOW (ref 12.0–15.0)
LYMPHS ABS: 1.2 10*3/uL (ref 0.7–4.0)
Lymphocytes Relative: 18 % (ref 12–46)
MCH: 28.3 pg (ref 26.0–34.0)
MCHC: 34.8 g/dL (ref 30.0–36.0)
MCV: 81.3 fL (ref 78.0–100.0)
MONO ABS: 0.6 10*3/uL (ref 0.1–1.0)
MONOS PCT: 9 % (ref 3–12)
NEUTROS ABS: 4.5 10*3/uL (ref 1.7–7.7)
NEUTROS PCT: 70 % (ref 43–77)
Platelets: 164 10*3/uL (ref 150–400)
RBC: 3.85 MIL/uL — AB (ref 3.87–5.11)
RDW: 14.4 % (ref 11.5–15.5)
WBC: 6.4 10*3/uL (ref 4.0–10.5)

## 2013-12-12 LAB — OB RESULTS CONSOLE HIV ANTIBODY (ROUTINE TESTING): HIV: REACTIVE

## 2013-12-12 NOTE — Progress Notes (Signed)
Subjective:    Patient ID: Tamara Briggs, female    DOB: 09/03/1985, 28 y.o.   MRN: [id number]  HPI Tamara Briggs is a 28yo F with HIV, schizophrenia, and in 3rd trimester of pregnancy, at 28Wk 1day, on truvada/atazanavir 400/ritonavir 100mg  daily. She is also on lower dose of latuda & zoloft while one her pregnancy/HIV medications. She was last seen in our clinic in early March, and [place] [facility] [place]. Appears that she may have missed her last ob visit. She had ultrasound done in early march due to abn AFP at gestation 21wk 4 days. She reports excess sleepiness and loss of appetite, no nausea or vomiting or GERD. Does have low back pain and pelvis pain occasionally. No dysuria.  Current Outpatient Prescriptions on File Prior to Visit  Medication Sig Dispense Refill  . atazanavir (REYATAZ) 200 MG capsule Take 2 capsules (400 mg total) by mouth daily with breakfast.  60 capsule  5  . cetirizine-pseudoephedrine (ZYRTEC-D) 5-120 MG per tablet Take 1 tablet by mouth 2 (two) times daily.  30 tablet  6  . emtricitabine-tenofovir (TRUVADA) 200-300 MG per tablet Take 1 tablet by mouth daily.  30 tablet  11  . ENSURE (ENSURE) Take 237 mLs by mouth 2 (two) times daily between meals.  237 mL  11  . Lurasidone HCl (LATUDA) 20 MG TABS Take by mouth.      . Prenatal Multivit-Min-Fe-FA (PRENATAL VITAMINS) 0.8 MG tablet Take 1 tablet by mouth daily.  30 tablet  12  . ritonavir (NORVIR) 100 MG TABS tablet Take 1 tablet (100 mg total) by mouth daily.  30 tablet  11  . sertraline (ZOLOFT) 50 MG tablet Take 50 mg by mouth daily.       No current facility-administered medications on file prior to visit.   Active Ambulatory Problems    Diagnosis Date Noted  . HIV (human immunodeficiency virus infection) 07/27/2011  . Chlamydia and Gonorrhea 07/27/2011  . H/O abnormal cervical Papanicolaou smear 07/26/2008  . Tobacco abuse 03/15/2013  . h/o crack Cocaine abuse 03/15/2013  . h/o oral Thrush 03/15/2013  .  Protein-calorie malnutrition, severe 03/16/2013  . Pyelonephritis 07/21/2013  . Maternal HIV positive complicating pregnancy in second trimester, antepartum 10/18/2013  . Schizophrenia 10/18/2013  . Abnormal findings on antenatal screening 10/27/2013  . History of neural tube defect 11/16/2013   Resolved Ambulatory Problems    Diagnosis Date Noted  . Underweight 07/27/2011  . Acute otitis externa of left ear 03/15/2013  . acute otitis media 03/15/2013  . Unspecified protein-calorie malnutrition 03/15/2013  . Gonorrhea 03/15/2013  . Acute sinusitis 03/15/2013  . Hyponatremia 07/21/2013  . Hypokalemia 07/21/2013  . Undernutrition 10/18/2013  . Abnormal antenatal AFP screen 10/26/2013   Past Medical History  Diagnosis Date  . Crack cocaine use   . Anxiety   . Depression   . Bipolar disorder   . 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 Otitis media of left ear 03/15/2013       Review of Systems Ros mentioned in hpi. Otherwise negative    Objective:   Physical Exam BP 102/68  Pulse 96  Temp(Src) 98.7 F (37.1 C) (Oral)  Wt 114 lb (51.71 kg)  LMP 02/26/2013. Physical Exam  Constitutional:  oriented to person, place, and time. appears well-developed and well-nourished. No distress.  HENT:  Mouth/Throat: Oropharynx is clear and moist. No oropharyngeal exudate.  Cardiovascular: Normal rate, regular rhythm and normal heart sounds. Exam reveals no gallop and no friction  rub.  No murmur heard.  Pulmonary/Chest: Effort normal and breath sounds normal. No respiratory distress.  has no wheezes.  Abdominal: Soft. Bowel sounds are normal.  exhibits no distension. There is no tenderness. Gravid abd Lymphadenopathy: no cervical adenopathy.  Neurological: alert and oriented to person, place, and time.  Skin: Skin is warm and dry. No rash noted. No erythema.  Psychiatric: a normal mood and affect.  behavior is normal.         Assessment & Plan:  hiv = controlled with VL<200 but not  undetectable. We will check labs today. Continue on current medications through pregnancy  Pregnancy = has visit on thur, in 2 days, and repeat u/s on friday

## 2013-12-13 LAB — T-HELPER CELL (CD4) - (RCID CLINIC ONLY)
CD4 T CELL HELPER: 26 % — AB (ref 33–55)
CD4 T Cell Abs: 290 /uL — ABNORMAL LOW (ref 400–2700)

## 2013-12-14 ENCOUNTER — Encounter (HOSPITAL_COMMUNITY): Payer: Self-pay

## 2013-12-14 ENCOUNTER — Inpatient Hospital Stay (HOSPITAL_COMMUNITY)
Admission: AD | Admit: 2013-12-14 | Discharge: 2013-12-23 | DRG: 781 | Disposition: A | Discharge status: Home / Self Care | Payer: Medicaid Other | Source: Ambulatory Visit | Attending: Obstetrics & Gynecology | Admitting: Obstetrics & Gynecology

## 2013-12-14 ENCOUNTER — Inpatient Hospital Stay (HOSPITAL_COMMUNITY): Payer: Medicaid Other

## 2013-12-14 ENCOUNTER — Encounter: Payer: Medicaid Other | Admitting: Family Medicine

## 2013-12-14 DIAGNOSIS — O99330 Smoking (tobacco) complicating pregnancy, unspecified trimester: Secondary | ICD-10-CM | POA: Diagnosis present

## 2013-12-14 DIAGNOSIS — D649 Anemia, unspecified: Secondary | ICD-10-CM | POA: Diagnosis present

## 2013-12-14 DIAGNOSIS — O289 Unspecified abnormal findings on antenatal screening of mother: Secondary | ICD-10-CM | POA: Diagnosis present

## 2013-12-14 DIAGNOSIS — F209 Schizophrenia, unspecified: Secondary | ICD-10-CM | POA: Diagnosis present

## 2013-12-14 DIAGNOSIS — B20 Human immunodeficiency virus [HIV] disease: Secondary | ICD-10-CM

## 2013-12-14 DIAGNOSIS — O98712 Human immunodeficiency virus [HIV] disease complicating pregnancy, second trimester: Secondary | ICD-10-CM

## 2013-12-14 DIAGNOSIS — E43 Unspecified severe protein-calorie malnutrition: Secondary | ICD-10-CM | POA: Diagnosis present

## 2013-12-14 DIAGNOSIS — Z21 Asymptomatic human immunodeficiency virus [HIV] infection status: Secondary | ICD-10-CM

## 2013-12-14 DIAGNOSIS — O98519 Other viral diseases complicating pregnancy, unspecified trimester: Secondary | ICD-10-CM

## 2013-12-14 DIAGNOSIS — O99340 Other mental disorders complicating pregnancy, unspecified trimester: Secondary | ICD-10-CM | POA: Diagnosis present

## 2013-12-14 DIAGNOSIS — O42919 Preterm premature rupture of membranes, unspecified as to length of time between rupture and onset of labor, unspecified trimester: Secondary | ICD-10-CM | POA: Diagnosis present

## 2013-12-14 DIAGNOSIS — F1410 Cocaine abuse, uncomplicated: Secondary | ICD-10-CM | POA: Diagnosis present

## 2013-12-14 DIAGNOSIS — O4700 False labor before 37 completed weeks of gestation, unspecified trimester: Secondary | ICD-10-CM | POA: Diagnosis present

## 2013-12-14 DIAGNOSIS — O09899 Supervision of other high risk pregnancies, unspecified trimester: Secondary | ICD-10-CM | POA: Diagnosis present

## 2013-12-14 DIAGNOSIS — Z720 Tobacco use: Secondary | ICD-10-CM | POA: Diagnosis present

## 2013-12-14 DIAGNOSIS — O4290 Premature rupture of membranes, unspecified as to length of time between rupture and onset of labor, unspecified weeks of gestation: Principal | ICD-10-CM | POA: Diagnosis present

## 2013-12-14 DIAGNOSIS — F341 Dysthymic disorder: Secondary | ICD-10-CM | POA: Diagnosis present

## 2013-12-14 HISTORY — DX: Unspecified infectious disease: B99.9

## 2013-12-14 LAB — URINALYSIS, ROUTINE W REFLEX MICROSCOPIC
Bilirubin Urine: NEGATIVE
Glucose, UA: >1000 mg/dL — AB
Hgb urine dipstick: NEGATIVE
KETONES UR: NEGATIVE mg/dL
LEUKOCYTES UA: NEGATIVE
NITRITE: NEGATIVE
PROTEIN: NEGATIVE mg/dL
Specific Gravity, Urine: >1.03 — ABNORMAL HIGH (ref 1.005–1.030)
UROBILINOGEN UA: 0.2 mg/dL (ref 0.0–1.0)
pH: 8 (ref 5.0–8.0)

## 2013-12-14 LAB — OB RESULTS CONSOLE GC/CHLAMYDIA
CHLAMYDIA, DNA PROBE: NEGATIVE
Gonorrhea: NEGATIVE

## 2013-12-14 LAB — OB RESULTS CONSOLE GBS: STREP GROUP B AG: POSITIVE

## 2013-12-14 LAB — CBC
HCT: 31.6 % — ABNORMAL LOW (ref 36.0–46.0)
Hemoglobin: 10.8 g/dL — ABNORMAL LOW (ref 12.0–15.0)
MCH: 29.1 pg (ref 26.0–34.0)
MCHC: 34.2 g/dL (ref 30.0–36.0)
MCV: 85.2 fL (ref 78.0–100.0)
PLATELETS: 151 10*3/uL (ref 150–400)
RBC: 3.71 MIL/uL — AB (ref 3.87–5.11)
RDW: 13.9 % (ref 11.5–15.5)
WBC: 8.5 10*3/uL (ref 4.0–10.5)

## 2013-12-14 LAB — RAPID URINE DRUG SCREEN, HOSP PERFORMED
Amphetamines: NOT DETECTED
Barbiturates: NOT DETECTED
Benzodiazepines: NOT DETECTED
Cocaine: POSITIVE — AB
Opiates: NOT DETECTED
Tetrahydrocannabinol: NOT DETECTED

## 2013-12-14 LAB — URINE MICROSCOPIC-ADD ON

## 2013-12-14 LAB — TYPE AND SCREEN
ABO/RH(D): O POS
ANTIBODY SCREEN: NEGATIVE

## 2013-12-14 LAB — GC/CHLAMYDIA PROBE AMP
CT PROBE, AMP APTIMA: NEGATIVE
GC Probe RNA: NEGATIVE

## 2013-12-14 LAB — HIV-1 RNA QUANT-NO REFLEX-BLD
HIV 1 RNA QUANT: 133 {copies}/mL — AB (ref <20)
HIV-1 RNA Quant, Log: 2.12 {Log} — ABNORMAL HIGH (ref <1.30)

## 2013-12-14 LAB — POCT FERN TEST: POCT FERN TEST: POSITIVE

## 2013-12-14 MED ORDER — ATAZANAVIR SULFATE 200 MG PO CAPS
400.0000 mg | ORAL_CAPSULE | Freq: Every day | ORAL | Status: DC
  Administered 2013-12-14 – 2013-12-23 (×10): 400 mg via ORAL
  Filled 2013-12-14 (×12): qty 2

## 2013-12-14 MED ORDER — AMOXICILLIN 500 MG PO CAPS
500.0000 mg | ORAL_CAPSULE | Freq: Three times a day (TID) | ORAL | Status: DC
  Administered 2013-12-16 (×2): 500 mg via ORAL
  Filled 2013-12-14 (×5): qty 1

## 2013-12-14 MED ORDER — DEXTROSE 5 % IV SOLN
500.0000 mg | INTRAVENOUS | Status: AC
  Administered 2013-12-14 – 2013-12-15 (×2): 500 mg via INTRAVENOUS
  Filled 2013-12-14 (×2): qty 500

## 2013-12-14 MED ORDER — MAGNESIUM SULFATE 40 G IN LACTATED RINGERS - SIMPLE
2.0000 g/h | INTRAVENOUS | Status: AC
  Administered 2013-12-14 (×2): 2 g/h via INTRAVENOUS
  Filled 2013-12-14 (×2): qty 500

## 2013-12-14 MED ORDER — LURASIDONE HCL 40 MG PO TABS
20.0000 mg | ORAL_TABLET | Freq: Every day | ORAL | Status: DC
  Administered 2013-12-14 – 2013-12-15 (×2): 20 mg via ORAL
  Filled 2013-12-14 (×4): qty 1

## 2013-12-14 MED ORDER — ZOLPIDEM TARTRATE 5 MG PO TABS: 5.0000 mg | ORAL_TABLET | Freq: Every evening | ORAL | Status: DC | PRN

## 2013-12-14 MED ORDER — ACETAMINOPHEN 325 MG PO TABS: 650.0000 mg | ORAL_TABLET | ORAL | Status: DC | PRN

## 2013-12-14 MED ORDER — BETAMETHASONE SOD PHOS & ACET 6 (3-3) MG/ML IJ SUSP
12.0000 mg | INTRAMUSCULAR | Status: AC
  Administered 2013-12-14 – 2013-12-15 (×2): 12 mg via INTRAMUSCULAR
  Filled 2013-12-14 (×2): qty 2

## 2013-12-14 MED ORDER — DOCUSATE SODIUM 100 MG PO CAPS
100.0000 mg | ORAL_CAPSULE | Freq: Every day | ORAL | Status: DC
  Administered 2013-12-14 – 2013-12-16 (×3): 100 mg via ORAL
  Filled 2013-12-14 (×4): qty 1

## 2013-12-14 MED ORDER — SODIUM CHLORIDE 0.9 % IV SOLN: INTRAVENOUS | Status: DC

## 2013-12-14 MED ORDER — AZITHROMYCIN 500 MG PO TABS
500.0000 mg | ORAL_TABLET | Freq: Every day | ORAL | Status: AC
  Administered 2013-12-16 – 2013-12-20 (×5): 500 mg via ORAL
  Filled 2013-12-14 (×6): qty 1

## 2013-12-14 MED ORDER — ENSURE COMPLETE PO LIQD
237.0000 mL | Freq: Two times a day (BID) | ORAL | Status: DC
  Administered 2013-12-14 – 2013-12-23 (×20): 237 mL via ORAL
  Filled 2013-12-14 (×25): qty 237

## 2013-12-14 MED ORDER — LACTATED RINGERS IV SOLN
INTRAVENOUS | Status: DC
  Administered 2013-12-14 – 2013-12-17 (×5): via INTRAVENOUS
  Administered 2013-12-18: 50 mL/h via INTRAVENOUS

## 2013-12-14 MED ORDER — MAGNESIUM SULFATE BOLUS VIA INFUSION
4.0000 g | Freq: Once | INTRAVENOUS | Status: AC
  Administered 2013-12-14: 4 g via INTRAVENOUS
  Filled 2013-12-14: qty 500

## 2013-12-14 MED ORDER — SERTRALINE HCL 50 MG PO TABS
50.0000 mg | ORAL_TABLET | Freq: Every day | ORAL | Status: DC
  Administered 2013-12-14 – 2013-12-23 (×10): 50 mg via ORAL
  Filled 2013-12-14 (×12): qty 1

## 2013-12-14 MED ORDER — CALCIUM CARBONATE ANTACID 500 MG PO CHEW
2.0000 | CHEWABLE_TABLET | ORAL | Status: DC | PRN
  Filled 2013-12-14: qty 2

## 2013-12-14 MED ORDER — PRENATAL MULTIVITAMIN CH
1.0000 | ORAL_TABLET | Freq: Every day | ORAL | Status: DC
  Administered 2013-12-15 – 2013-12-22 (×8): 1 via ORAL
  Filled 2013-12-14 (×9): qty 1

## 2013-12-14 MED ORDER — PRENATAL MULTIVITAMIN CH
1.0000 | ORAL_TABLET | Freq: Every day | ORAL | Status: DC
  Administered 2013-12-14: 1 via ORAL
  Filled 2013-12-14: qty 1

## 2013-12-14 MED ORDER — EMTRICITABINE-TENOFOVIR DF 200-300 MG PO TABS
1.0000 | ORAL_TABLET | Freq: Every day | ORAL | Status: DC
  Administered 2013-12-14 – 2013-12-23 (×10): 1 via ORAL
  Filled 2013-12-14 (×12): qty 1

## 2013-12-14 MED ORDER — RITONAVIR 100 MG PO TABS
100.0000 mg | ORAL_TABLET | Freq: Every day | ORAL | Status: DC
  Administered 2013-12-14 – 2013-12-23 (×10): 100 mg via ORAL
  Filled 2013-12-14 (×12): qty 1

## 2013-12-14 MED ORDER — SODIUM CHLORIDE 0.9 % IV SOLN
2.0000 g | Freq: Four times a day (QID) | INTRAVENOUS | Status: AC
  Administered 2013-12-14 – 2013-12-15 (×8): 2 g via INTRAVENOUS
  Filled 2013-12-14 (×8): qty 2000

## 2013-12-14 NOTE — H&P (Signed)
Tamara BurrowJohn V Dimitry Holsworth, MD Physician Signed Obstetrics MAU Provider Note Service date: 12/14/2013 2:07 AM     History      CSN: [id number]   Arrival date and time: 12/14/13 0108    First Provider Initiated Contact with Patient 12/14/13 0157          Chief Complaint   Patient presents with   .  Rupture of Membranes    H[facility] 27 [facility], brought to MAU by EMS for gush of fluid, found to have + SROM with a puddle positive, fern positive ROM. She has no bleeding or pain. And reports last cocaine use Tuesday Pt [facility] for HIV+, Schizophrenia, Hx Crack cocaine use,  With Prenatal course notable for treatment for GC and Chlamydia, PT had an elevated MSAFP with ONTD risk elevated with negative Detail ultrasound. Pt did not have amniocentesis.   Pertinent Gynecological History: Menses: LMP unreliable, listed as 02/26/2013 Bleeding: none at admit Contraception:   DES exposure: unknown Blood transfusions: none Sexually transmitted diseases: recent diagnosis: gc, chl treated earlier in pregnancy Previous GYN Procedures:    Last mammogram:  Date:   Last pap: normal Date: 09/2013      Past Medical History   Diagnosis  Date   .  Crack cocaine use     .  Anxiety     .  Depression     .  Schizophrenia     .  Bipolar disorder     .  HIV test positive     .  Otitis media of left ear  03/15/2013       Past Surgical History   Procedure  Laterality  Date   .  Colposcopy    2009       Family History   Problem  Relation  Age of Onset   .  Diabetes  Mother         History   Substance Use Topics   .  Smoking status:  Current Every Day Smoker -- 0.50 packs/day for 3 years       Types:  Cigarettes   .  Smokeless tobacco:  Never Used         Comment: cutting back   .  Alcohol Use:  No      Allergies: No Known Allergies    Prescriptions prior to admission   Medication  Sig  Dispense  Refill   .  atazanavir (REYATAZ) 200 MG capsule  Take 2 capsules (400 mg  total) by mouth daily with breakfast.   60 capsule   5   .  emtricitabine-tenofovir (TRUVADA) 200-300 MG per tablet  Take 1 tablet by mouth daily.   30 tablet   11   .  ENSURE (ENSURE)  Take 237 mLs by mouth 2 (two) times daily between meals.   237 mL   11   .  Lurasidone HCl (LATUDA) 20 MG TABS  Take by mouth.         .  Prenatal Multivit-Min-Fe-FA (PRENATAL VITAMINS) 0.8 MG tablet  Take 1 tablet by mouth daily.   30 tablet   12   .  ritonavir (NORVIR) 100 MG TABS tablet  Take 1 tablet (100 mg total) by mouth daily.   30 tablet   11   .  sertraline (ZOLOFT) 50 MG tablet  Take 50 mg by mouth daily.         .  cetirizine-pseudoephedrine (ZYRTEC-D) 5-120 MG per tablet  Take 1 tablet by mouth 2 (two) times daily.   30 tablet   6      ROS pos for gush of fluid this a.m.        Negative for bleeding        Pos for cocaine use tuesday Physical Exam      Blood pressure 116/62, pulse 97, temperature 98.9 F (37.2 C), temperature source Oral, resp. rate 20, last menstrual period 02/26/2013, SpO2 100.00%.   Physical Exam  Constitutional: She appears well-developed.  Thin Af-Am female, with slight slowed speech patterns, and repetitive lip movement at rest. Alert , oriented , cooperative  HENT:   Head: Normocephalic.  Eyes: Pupils are equal, round, and reactive to light.  Neck: Neck supple.  Cardiovascular: Normal rate and regular rhythm.   Respiratory: Effort normal.  GI: Soft.  Gravid uterus c/w dates. nontender uterus.  Genitourinary:  Perineum wet, without lesions Sterile speculum: + pooling clear fluid, no malodor,  Cervix visually closed, difficult spec exam. Fern positive  Musculoskeletal: Normal range of motion.  Neurological: She is alert.  Psychiatric: She has a normal mood and affect. Her behavior is normal.  gbs, GC/chl collected    MAU Course    Procedures    MDM Review of labs pmh, exam, admit    Assessment and Plan    1. PPROM 27 w 5 days 2  HIV pos 3. Hx  schizophrenia 4. Hx ++recent drug use cocaine,   5. Hx + GC/Chl this pregnancy , treated 6  Hx protein--calorie malnutrition   Plan: Admit to antenatal ,Continued on antiretroviral's, begun on Mag Sulfate x 48 hr, Betamethasone, Antibiotic prophylaxis begun for PPROM, NICU consulted, ID consult in a.m     Tamara BurrowJohn V Alexy Heldt 12/14/2013, 2:07 AM

## 2013-12-14 NOTE — Clinical SW OB High Risk (Signed)
Clinical Social Work Department ANTENATAL PSYCHOSOCIAL ASSESSMENT 12/14/2013  Patient:  Tamara Briggs,Tamara Briggs   Account Number:  [id number]  Admit Date:  12/14/2013     DOB:  1986/07/13   Age:  [age] Gestational age on admission:  27     Expected delivery date:   Admitting diagnosis:   Rupture of Membranes    Clinical Social Worker:  Nobie PutnamEDRA Cailyn Houdek,  [place]  Date/Time:  12/14/2013 01:52 PM  FAMILY/HOME ENVIRONMENT  Home address:   [address]; [place], [place] [address]   Household Member/Support Name Relationship Age  Tamara LambRobert Briggs SIGNIFICANT OTHER 45   Other support:   None     N/A     PSYCHOSOCIAL DATA  Information source:  Patient Interview Other information source:    Resources:   Employment:   N/A   [place]  School:   [employer]- working to earn [employer] Diploma     Current grade:    Homebound arranged?    Other Resources  Food Stamps  [facility]  Medicaid    Cultural/Environmental issues impacting care:    STRENGTHS / WEAKNESSES / FACTORS TO CONSIDER  Concerns related to hospitalization:   Pt ruptured 27 weeks & 5 days & will likely be hospitalized until delivery.   Previous pregnancies/feelings towards pregnancy?  Concerns related to being/becoming a mother?   Pt has a 761 year old daughter, Tamara Briggs who she raised until she was 28 years old.  She was a single parent & talked about the financial hardships she experienced before Child Protective Services, (CPS) placed the child with her father, Tamara Briggs.  She plans to raise this baby despite her substance abuse issues.   Social support (FOB? Who is/will be helping with baby/other kids)   Support system limited to [employer] Engineeringobert Briggs, the identified FOB. They have been in a relationship for 1 year.  According to pt, he is supportive & does not use substances.   Couples relationship:   Supportive.   Recent stressful life events (life changes in past year?):   Pt was diagnosed with O42 in  March 2014.  [facility] Dr[facility] Anne Briggs.  Pt states she has maintained regular care since diagnoses & attends appointments once a month.  She is currently taking Truvada, Norvir & Reyataz to treat O42.  She admits to some depressed moods but denies any SI.  FOB is aware of diagnosed.  He is negative, per pt.  CSW offered to provide pt with information on [facility], as a resources for support to [employer] population.   Prenatal care/education/home preparations?   Pt understands that she will be here until delivery.   Domestic violence (of any type):  N If yes to domestic violence describe/action plan:  Substance use during pregnancy.  (If YES, complete SBIRT):  Y  Complete PHQ-9 (Depression Screening) on all antenatal patients.  PHQ-9 score:    (IF SCORE => 15 complete TREAT)  Follow up recommendations:   CSW will continue to follow pt throughout hospitalizations to act as a support & provide resources.   Patient advised/response?   Pt was very cooperative during assessment & answered this CSW questions appropriately.   Other:    Clinical Assessment/Plan Pt is a 28 year old, G3P1 who presented to the hospital with ruptured membranes.  She lives with the FOB & identifies him as her only source of support.  He is not present but aware that  she is hospitalized, per pt.  Pt was diagnosed with O42 in March '14.  She has maintained regular care since then, per pt.  She denies any depressed moods that she could not manage.  [facility].  Pt was diagnosed with Schizophrenia, Depression/Anxiety & Bipolar disorder.  She was prescribed Zoloft & Latuda, of which she has taken to-date.  Pt experiences AH/VH, when she is "low on her medicine."  She reports that she sees & hears people who are not there.  She denies that they tell her to do anything to harm herself or anyone else.  [facility] & told CSW  that she has an appointment on 12/28/13.  She denies any depression now.  Pt was a history of crack cocaine abuse.  She started using 2 years ago & has continued "off & on" since then.  [facility] [place] around the Winter of last [place].  Pt states she successfully completed a 28 day program & was able to maintain sobriety for 6 months.  Pt sought treatment then because she was using everyday, "strung out & wanted to get off drugs." Currently, pt denies that her continued substance use is a problem that requires treatment.  She estimates that she uses $10-$20 worth of crack "every 2 weeks."  She last smoked crack on Tuesday.  She also smokes MJ, "once a month."  CSW offered to provide pt with substance abuse treatment programs that she would qualify for, once baby is born.  She was receptive to information.  CSW also advised pt that a CPS report would be made once the infant was born due to her substance abuse history.  Pt was understanding & hopeful that the FOB can care for the child.  Pt agrees to remain hospitalized until she is medically stable for discharge or until delivery.  CSW will continue to follow & assist as needed throughout hospitalization.

## 2013-12-14 NOTE — Progress Notes (Signed)
UR completed 

## 2013-12-14 NOTE — MAU Note (Signed)
Pt states leaking clear fluid x 1 hour. Contractions every 20 minutes. Positive fetal movement. Denies vaginal bleeding. Last had intercourse over 1 week ago. Denies n/v/d. Goes to high risk clinic for HIV & cocaine use. Last used cocaine on Tues 4/21.

## 2013-12-14 NOTE — Progress Notes (Addendum)
Patient ID: Tamara Briggs, female   DOB: 10/12/1985, 28 y.o.   MRN: [id number]         Patient Active Problem List   Diagnosis Date Noted  . Preterm premature rupture of membranes (PPROM) delivered, current hospitalization 12/14/2013  . Normocytic anemia 12/14/2013  . 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 Abnormal findings on antenatal screening 10/27/2013  . Maternal HIV positive complicating pregnancy in second trimester, antepartum 10/18/2013  . Schizophrenia 10/18/2013  . Pyelonephritis 07/21/2013  . Protein-calorie malnutrition, severe 03/16/2013  . Tobacco abuse 03/15/2013  . h/o crack Cocaine abuse 03/15/2013  . h/o oral Thrush 03/15/2013  . HIV (human immunodeficiency virus infection) 07/27/2011  . Chlamydia and Gonorrhea 07/27/2011  . H/O abnormal cervical Papanicolaou smear 07/26/2008    Current Discharge Medication List    CONTINUE these medications which have NOT CHANGED   Details  atazanavir (REYATAZ) 200 MG capsule Take 2 capsules (400 mg total) by mouth daily with breakfast. Qty: 60 capsule, Refills: 5   Associated Diagnoses: HIV disease    emtricitabine-tenofovir (TRUVADA) 200-300 MG per tablet Take 1 tablet by mouth daily. Qty: 30 tablet, Refills: 11   Associated Diagnoses: HIV disease    ENSURE (ENSURE) Take 237 mLs by mouth 2 (two) times daily between meals. Qty: 237 mL, Refills: 11   Associated Diagnoses: Failure to thrive in adult    Lurasidone HCl (LATUDA) 20 MG TABS Take by mouth.    Prenatal Multivit-Min-Fe-FA (PRENATAL VITAMINS) 0.8 MG tablet Take 1 tablet by mouth daily. Qty: 30 tablet, Refills: 12   Comments: PLEASE SUBSTITUTE WITH CHEWABLE PRENATALS Associated Diagnoses: Maternal HIV positive complicating pregnancy in second trimester, antepartum; Schizophrenia; Undernutrition; Cocaine abuse; Abnormal antenatal AFP screen    ritonavir (NORVIR) 100 MG TABS tablet Take 1 tablet (100 mg total) by mouth daily. Qty: 30 tablet, Refills: 11    Associated Diagnoses: HIV disease    sertraline (ZOLOFT) 50 MG tablet Take 50 mg by mouth daily.    cetirizine-pseudoephedrine (ZYRTEC-D) 5-120 MG per tablet Take 1 tablet by mouth 2 (two) times daily. Qty: 30 tablet, Refills: 6        Subjective: Tamara Briggs is followed by my partner, Dr. Judyann Munsonynthia Snider, for her HIV infection. She was admitted early this morning after her starting to have premature contractions and noted a gush of fluid. She has been taking Truvada, Reyataz and Norvir. She can name her medications to describe taking them correctly. She has been using her pill box. She does not believe she has missed any doses.  Review of Systems: Pertinent items are noted in HPI.  Past Medical History  Diagnosis Date  . Crack cocaine use   . Anxiety   . Depression   . Schizophrenia   . Bipolar disorder   . 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 Otitis media of left ear 03/15/2013    History  Substance Use Topics  . Smoking status: Current Every Day Smoker -- 0.50 packs/day for 3 years    Types: Cigarettes  . Smokeless tobacco: Never Used     Comment: cutting back  . Alcohol Use: No    Family History  Problem Relation Age of Onset  . Diabetes Mother     No Known Allergies  Objective: Temp: 98.4 F (36.9 C) (04/23 0800) Temp src: Oral (04/23 0800) BP: 122/92 mmHg (04/23 1131) Pulse Rate: 81 (04/23 1131) Body mass index is 19.56 kg/(m^2).  General: She is laying in bed. She is quiet in no  distress. She has some lip tremor Oral: No oropharyngeal lesions Skin: No rash Lungs: Clear Cor: Regular S1 and S2 with no murmurs   Lab Results Lab Results  Component Value Date   WBC 8.5 12/14/2013   HGB 10.8* 12/14/2013   HCT 31.6* 12/14/2013   MCV 85.2 12/14/2013   PLT 151 12/14/2013    Lab Results  Component Value Date   CREATININE 0.46* 12/12/2013   BUN 7 12/12/2013   NA 134* 12/12/2013   K 3.8 12/12/2013   CL 102 12/12/2013   CO2 24 12/12/2013    Lab Results  Component Value Date    ALT 10 12/12/2013   AST 18 12/12/2013   ALKPHOS 97 12/12/2013   BILITOT 0.3 12/12/2013    Lab Results  Component Value Date   CHOL 129 07/26/2012   HDL 43 07/26/2012   LDLCALC 70 07/26/2012   TRIG 81 07/26/2012   CHOLHDL 3.0 07/26/2012    Lab Results HIV 1 RNA Quant (copies/mL)  Date Value  10/26/2013 142*  04/27/2013 268*  03/28/2013 901*     CD4 T Cell Abs (/uL)  Date Value  12/12/2013 290*  10/26/2013 300*  04/27/2013 300*     Assessment: It sounds as though her adherence with her antiretroviral regimen has been good. Her last viral load was detectable at 142. Repeat viral load results should be available this afternoon or tomorrow morning at the latest.  Plan: 1. Continue current antiretroviral regimen 2. Await results of repeat viral load 3. I will follow up tomorrow   Cliffton AstersJohn Caycee Wanat, MD [facility] [phone] pager   [phone] cell 12/14/2013, 2:53 PM   HIV 1 RNA Quant (copies/mL)  Date Value  12/12/2013 133*  10/26/2013 142*  04/27/2013 268*     CD4 T Cell Abs (/uL)  Date Value  12/12/2013 290*  10/26/2013 300*  04/27/2013 300*   Her repeat HIV viral load is down slightly more to 133. Although this remains detectable her risk of vertical transmission to her baby is very low and this is not an indication for C-section.  Cliffton AstersJohn Brighten Buzzelli, MD [facility] [phone] pager   [phone] cell 12/14/2013, 5:21 PM

## 2013-12-14 NOTE — MAU Provider Note (Signed)
History     CSN: [id number]  Arrival date and time: 12/14/13 0108   First Provider Initiated Contact with Patient 12/14/13 0157      Chief Complaint  Patient presents with  . Rupture of Membranes   [facility] 27 [facility], brought to MAU by EMS for gush of fluid, found to have + SROM with a puddle positive, fern positive ROM. She has no bleeding or pain. And reports last cocaine use Tuesday [facility]+, Schizophrenia, Hx Crack cocaine use,  With Prenatal course notable for treatment for GC and Chlamydia, PT had an elevated MSAFP with ONTD risk elevated with negative Detail ultrasound. Pt did not have amniocentesis.  Pertinent Gynecological History: Menses: LMP unreliable, listed as 02/26/2013 Bleeding: none at admit Contraception:  DES exposure: unknown Blood transfusions: none Sexually transmitted diseases: recent diagnosis: gc, chl treated earlier in pregnancy Previous GYN Procedures:   Last mammogram:  Date:  Last pap: normal Date: 09/2013   Past Medical History  Diagnosis Date  . Crack cocaine use   . Anxiety   . Depression   . Schizophrenia   . Bipolar disorder   . 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 Otitis media of left ear 03/15/2013    Past Surgical History  Procedure Laterality Date  . Colposcopy  2009    Family History  Problem Relation Age of Onset  . Diabetes Mother     History  Substance Use Topics  . Smoking status: Current Every Day Smoker -- 0.50 packs/day for 3 years    Types: Cigarettes  . Smokeless tobacco: Never Used     Comment: cutting back  . Alcohol Use: No    Allergies: No Known Allergies  Prescriptions prior to admission  Medication Sig Dispense Refill  . atazanavir (REYATAZ) 200 MG capsule Take 2 capsules (400 mg total) by mouth daily with breakfast.  60 capsule  5  . emtricitabine-tenofovir (TRUVADA) 200-300 MG per tablet Take 1 tablet by mouth daily.  30 tablet  11  . ENSURE (ENSURE) Take 237 mLs by mouth 2 (two)  times daily between meals.  237 mL  11  . Lurasidone HCl (LATUDA) 20 MG TABS Take by mouth.      . Prenatal Multivit-Min-Fe-FA (PRENATAL VITAMINS) 0.8 MG tablet Take 1 tablet by mouth daily.  30 tablet  12  . ritonavir (NORVIR) 100 MG TABS tablet Take 1 tablet (100 mg total) by mouth daily.  30 tablet  11  . sertraline (ZOLOFT) 50 MG tablet Take 50 mg by mouth daily.      . cetirizine-pseudoephedrine (ZYRTEC-D) 5-120 MG per tablet Take 1 tablet by mouth 2 (two) times daily.  30 tablet  6    ROS pos for gush of fluid this a.m.        Negative for bleeding       Pos for cocaine use tuesday Physical Exam   Blood pressure 116/62, pulse 97, temperature 98.9 F (37.2 C), temperature source Oral, resp. rate 20, last menstrual period 02/26/2013, SpO2 100.00%.  Physical Exam  Constitutional: She appears well-developed.  Thin Af-Am female, with slight slowed speech patterns, and repetitive lip movement at rest. Alert , oriented , cooperative  HENT:  Head: Normocephalic.  Eyes: Pupils are equal, round, and reactive to light.  Neck: Neck supple.  Cardiovascular: Normal rate and regular rhythm.   Respiratory: Effort normal.  GI: Soft.  Gravid uterus c/w dates. nontender uterus.  Genitourinary:  Perineum wet, without  lesions Sterile speculum: + pooling clear fluid, no malodor,  Cervix visually closed, difficult spec exam. Fern positive  Musculoskeletal: Normal range of motion.  Neurological: She is alert.  Psychiatric: She has a normal mood and affect. Her behavior is normal.  gbs, GC/chl collected  MAU Course  Procedures   MDM Review of labs pmh, exam, admit  Assessment and Plan  1. PPROM 27 w 5 days 2  HIV pos 3. Hx schizophrenia 4. Hx ++recent drug use cocaine,  5. Hx + GC/Chl this pregnancy , treated 6  Hx protein--calorie malnutrition  Plan: Admit to antenatal ,Continued on antiretroviral's, begun on Mag Sulfate x 48 hr, Betamethasone, Antibiotic prophylaxis begun for  PPROM, NICU consulted, ID consult in a.m    Tilda BurrowJohn V Sheyanne Munley 12/14/2013, 2:07 AM

## 2013-12-14 NOTE — Progress Notes (Signed)
12/14/13 1300  Clinical Encounter Type  Visited With Patient  Visit Type Initial   Attempted visit, but Jeanene was very drowsy.  Introduced Spiritual Care very briefly, but plan to follow up at a more convenient time.  Please also page as needs arise.  [address]Chaplain [place] Natarajan [place], [place] [phone]

## 2013-12-14 NOTE — Plan of Care (Signed)
Problem: Consults Goal: Birthing Suites Patient Information Press F2 to bring up selections list  Outcome: Completed/Met Date Met:  12/14/13  Pt < [redacted] weeks EGA

## 2013-12-15 ENCOUNTER — Ambulatory Visit (HOSPITAL_COMMUNITY): Admission: RE | Admit: 2013-12-15 | Payer: Medicaid Other | Source: Ambulatory Visit

## 2013-12-15 DIAGNOSIS — F209 Schizophrenia, unspecified: Secondary | ICD-10-CM

## 2013-12-15 DIAGNOSIS — O99340 Other mental disorders complicating pregnancy, unspecified trimester: Secondary | ICD-10-CM

## 2013-12-15 DIAGNOSIS — O4290 Premature rupture of membranes, unspecified as to length of time between rupture and onset of labor, unspecified weeks of gestation: Secondary | ICD-10-CM

## 2013-12-15 DIAGNOSIS — O4700 False labor before 37 completed weeks of gestation, unspecified trimester: Secondary | ICD-10-CM

## 2013-12-15 LAB — CBC
HCT: 29.4 % — ABNORMAL LOW (ref 36.0–46.0)
Hemoglobin: 9.7 g/dL — ABNORMAL LOW (ref 12.0–15.0)
MCH: 28.5 pg (ref 26.0–34.0)
MCHC: 33 g/dL (ref 30.0–36.0)
MCV: 86.5 fL (ref 78.0–100.0)
PLATELETS: 131 10*3/uL — AB (ref 150–400)
RBC: 3.4 MIL/uL — ABNORMAL LOW (ref 3.87–5.11)
RDW: 14.3 % (ref 11.5–15.5)
WBC: 9.1 10*3/uL (ref 4.0–10.5)

## 2013-12-15 NOTE — Consult Note (Signed)
Neonatology Consult to Antenatal Patient:  I was asked by Dr. Emelda FearFerguson to see this patient in order to provide antenatal counseling due to premature ROM.  Ms. Tamara Briggs was admitted yesterday at 2827 5/[redacted] weeks GA after SROM at about midnight on 4/23 (36 hours ago). She is currently not having active labor. She is getting BMZ and IV Ampicillin. Ms. Tamara Briggs is HIV positive and is on antiviral medications. She also has schizophrenia and bipolar disorder. She is on JordanLatuda and Zoloft. She also smokes 1/2 pack/day cigarettes and uses cocaine, last reported use on 4/21. The baby is female.  I spoke with the patient and her mother. Initially, [name] had her eyes closed and her mother spoke with me, but the patient spoke up a little later and it was clear she was listening. We discussed the worst case of delivery in the next 1-2 days, including usual DR management, possible respiratory complications and need for support, IV access, feedings (I informed her that she would not be able to breast feed due to HIV infection), LOS, Mortality and Morbidity, and long term outcomes. I let her know that we would continue antiviral treatment for the baby to help prevent transmission of HIV. She did not have any questions at this time. I offered a NICU tour to any interested family members and would be glad to come back if she has more questions later.  Thank you for asking me to see this patient.  Doretha Souhristie C. Zebulon Gantt, MD Neonatologist  The total length of face-to-face or floor/unit time for this encounter was 20 minutes. Counseling and/or coordination of care was 15 minutes of the above.

## 2013-12-15 NOTE — Progress Notes (Signed)
Pt found to be walking in the hallway and informed patient that she was on BR/BRP and that limited ambulating due to preterm contractions. Pt verlbalizes understanding and walks around the unit once more before returning to her room.

## 2013-12-15 NOTE — Progress Notes (Signed)
Pt off the monitor after reassurring FHR  

## 2013-12-15 NOTE — Progress Notes (Signed)
Patient ID: Tamara Briggs, female   DOB: 11/06/1985, 28 y.o.   MRN: [id number]       Subjective: Tamara Briggs is taking and tolerating her medications well.  Objective: Temp: 98.2 F (36.8 C) (04/24 0900) Temp src: Oral (04/24 0900) BP: 114/55 mmHg (04/24 0900) Pulse Rate: 92 (04/24 0900) Body mass index is 19.56 kg/(m^2).  General: She is resting quietly in bed.  Oral: No oropharyngeal lesions Skin: No rash Lungs: Clear Cor: Regular S1 and S2 with no murmurs   Lab Results Lab Results  Component Value Date   WBC 9.1 12/15/2013   HGB 9.7* 12/15/2013   HCT 29.4* 12/15/2013   MCV 86.5 12/15/2013   PLT 131* 12/15/2013    Lab Results  Component Value Date   CREATININE 0.46* 12/12/2013   BUN 7 12/12/2013   NA 134* 12/12/2013   K 3.8 12/12/2013   CL 102 12/12/2013   CO2 24 12/12/2013    Lab Results  Component Value Date   ALT 10 12/12/2013   AST 18 12/12/2013   ALKPHOS 97 12/12/2013   BILITOT 0.3 12/12/2013    Lab Results  Component Value Date   CHOL 129 07/26/2012   HDL 43 07/26/2012   LDLCALC 70 07/26/2012   TRIG 81 07/26/2012   CHOLHDL 3.0 07/26/2012    Lab Results HIV 1 RNA Quant (copies/mL)  Date Value  12/12/2013 133*  10/26/2013 142*  04/27/2013 268*     CD4 T Cell Abs (/uL)  Date Value  12/12/2013 290*  10/26/2013 300*  04/27/2013 300*     Assessment: Her current viral load is undetectable but low enough that the risk for vertical transmission is well below 1%. There is no indication for adding additional drugs to boost her therapy or consider C-section.  Plan: 1. Continue current antiretroviral regimen 2. Please call Dr. Enedina FinnerJeff Hatcher [phone]) for any infectious disease questions this weekend   Cliffton AstersJohn Fani Rotondo, MD [facility] [phone] pager   [phone] cell 12/15/2013, 12:23 PM [phone] pager   [phone] cell 12/15/2013, 12:23 PM

## 2013-12-15 NOTE — Progress Notes (Signed)
Patient ID: Tamara Briggs, female   DOB: 10/29/1985, 28 y.o.   MRN: [id number] FACULTY PRACTICE ANTEPARTUM(COMPREHENSIVE) NOTE  Tamara Briggs is a 28 y.o. G3P1011 at 1929w6d by midtrimester ultrasound who is admitted for PROM.   Fetal presentation is cephalic. Length of Stay:  1  Days  Subjective: No complaint Patient reports the fetal movement as active. Patient reports uterine contraction  activity as none. Patient reports  vaginal bleeding as none. Patient describes fluid per vagina as Clear.  Vitals:  Blood pressure 118/57, pulse 98, temperature 98 F (36.7 C), temperature source Oral, resp. rate 18, height 5\' 4"  (1.626 m), weight 114 lb (51.71 kg), last menstrual period 02/26/2013, SpO2 100.00%. Physical Examination:  General appearance - alert, well appearing, and in no distress Heart - normal rate and regular rhythm Abdomen - soft, nontender, nondistended Fundal Height:  size equals dates Cervical Exam: Not evaluated. Extremities: extremities normal,  Membranes:ruptured, clear fluid  Fetal Monitoring:  Baseline: 150 bpm, Variability: Good {> 6 bpm), Accelerations: Reactive and Decelerations: Absent  Labs:  Results for orders placed during the hospital encounter of 12/14/13 (from the past 24 hour(s))  CBC   Collection Time    12/15/13  5:25 AM      Result Value Ref Range   WBC 9.1  4.0 - 10.5 K/uL   RBC 3.40 (*) 3.87 - 5.11 MIL/uL   Hemoglobin 9.7 (*) 12.0 - 15.0 g/dL   HCT 40.929.4 (*) 81.136.0 - 91.446.0 %   MCV 86.5  78.0 - 100.0 fL   MCH 28.5  26.0 - 34.0 pg   MCHC 33.0  30.0 - 36.0 g/dL   RDW 78.214.3  95.611.5 - 21.315.5 %   Platelets 131 (*) 150 - 400 K/uL    Imaging Studies:     Currently EPIC will not allow sonographic studies to automatically populate into notes.  In the meantime, copy and paste results into note or free text.  Medications:  Scheduled . ampicillin (OMNIPEN) IV  2 g Intravenous Q6H   Followed by  . [START ON 12/16/2013] amoxicillin  500 mg Oral Q8H  . atazanavir   400 mg Oral Q breakfast  . [START ON 12/16/2013] azithromycin  500 mg Oral Daily  . docusate sodium  100 mg Oral Daily  . emtricitabine-tenofovir  1 tablet Oral Daily  . feeding supplement (ENSURE COMPLETE)  237 mL Oral BID BM  . lurasidone  20 mg Oral Daily  . prenatal multivitamin  1 tablet Oral Q1200  . ritonavir  100 mg Oral Q breakfast  . sertraline  50 mg Oral Daily   I have reviewed the patient's current medications.  ASSESSMENT: Patient Active Problem List   Diagnosis Date Noted  . Preterm premature rupture of membranes (PPROM) delivered, current hospitalization 12/14/2013  . Normocytic anemia 12/14/2013  . 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 Abnormal findings on antenatal screening 10/27/2013  . Maternal HIV positive complicating pregnancy in second trimester, antepartum 10/18/2013  . Schizophrenia 10/18/2013  . Pyelonephritis 07/21/2013  . Protein-calorie malnutrition, severe 03/16/2013  . Tobacco abuse 03/15/2013  . h/o crack Cocaine abuse 03/15/2013  . h/o oral Thrush 03/15/2013  . HIV (human immunodeficiency virus infection) 07/27/2011  . Chlamydia and Gonorrhea 07/27/2011  . H/O abnormal cervical Papanicolaou smear 07/26/2008    PLAN: Expectant management, delivery for fetal indication or PTL, chorio.  Adam PhenixJames G Quirino Kakos 12/15/2013,9:07 AM

## 2013-12-16 ENCOUNTER — Encounter (HOSPITAL_COMMUNITY): Payer: Self-pay | Admitting: *Deleted

## 2013-12-16 LAB — CBC
HEMATOCRIT: 28.2 % — AB (ref 36.0–46.0)
Hemoglobin: 9.2 g/dL — ABNORMAL LOW (ref 12.0–15.0)
MCH: 28.5 pg (ref 26.0–34.0)
MCHC: 32.6 g/dL (ref 30.0–36.0)
MCV: 87.3 fL (ref 78.0–100.0)
Platelets: 130 10*3/uL — ABNORMAL LOW (ref 150–400)
RBC: 3.23 MIL/uL — AB (ref 3.87–5.11)
RDW: 14.3 % (ref 11.5–15.5)
WBC: 8.3 10*3/uL (ref 4.0–10.5)

## 2013-12-16 MED ORDER — MAGNESIUM SULFATE BOLUS VIA INFUSION
4.0000 g | Freq: Once | INTRAVENOUS | Status: DC
  Filled 2013-12-16: qty 500

## 2013-12-16 MED ORDER — ACETAMINOPHEN 325 MG PO TABS
650.0000 mg | ORAL_TABLET | ORAL | Status: DC | PRN
Start: 2013-12-16 — End: 2013-12-23

## 2013-12-16 MED ORDER — OXYCODONE-ACETAMINOPHEN 5-325 MG PO TABS: 1.0000 | ORAL_TABLET | ORAL | Status: DC | PRN

## 2013-12-16 MED ORDER — FENTANYL CITRATE 0.05 MG/ML IJ SOLN: 50.0000 ug | INTRAMUSCULAR | Status: DC | PRN

## 2013-12-16 MED ORDER — MAGNESIUM SULFATE 40 G IN LACTATED RINGERS - SIMPLE
2.0000 g/h | INTRAVENOUS | Status: DC
  Filled 2013-12-16: qty 500

## 2013-12-16 MED ORDER — MAGNESIUM SULFATE BOLUS VIA INFUSION
4.0000 g | Freq: Once | INTRAVENOUS | Status: AC
Start: 2013-12-16 — End: 2013-12-16
  Administered 2013-12-16: 4 g via INTRAVENOUS
  Filled 2013-12-16: qty 500

## 2013-12-16 MED ORDER — ZIDOVUDINE 10 MG/ML IV SOLN
1.0000 mg/kg/h | INTRAVENOUS | Status: DC
  Filled 2013-12-16: qty 40

## 2013-12-16 MED ORDER — CITRIC ACID-SODIUM CITRATE 334-500 MG/5ML PO SOLN
30.0000 mL | ORAL | Status: DC | PRN
Start: 2013-12-16 — End: 2013-12-17

## 2013-12-16 MED ORDER — ONDANSETRON HCL 4 MG/2ML IJ SOLN
4.0000 mg | Freq: Four times a day (QID) | INTRAMUSCULAR | Status: DC | PRN
  Filled 2013-12-16: qty 2

## 2013-12-16 MED ORDER — LACTATED RINGERS IV SOLN: 500.0000 mL | INTRAVENOUS | Status: DC | PRN

## 2013-12-16 MED ORDER — LACTATED RINGERS IV SOLN
INTRAVENOUS | Status: DC
  Administered 2013-12-17: 05:00:00 via INTRAVENOUS

## 2013-12-16 MED ORDER — DEXTROSE 5 % IV SOLN
2.0000 mg/kg | Freq: Once | INTRAVENOUS | Status: DC
  Filled 2013-12-16: qty 10.3

## 2013-12-16 MED ORDER — IBUPROFEN 600 MG PO TABS: 600.0000 mg | ORAL_TABLET | Freq: Four times a day (QID) | ORAL | Status: DC | PRN

## 2013-12-16 MED ORDER — LIDOCAINE HCL (PF) 1 % IJ SOLN: 30.0000 mL | INTRAMUSCULAR | Status: DC | PRN

## 2013-12-16 MED ORDER — OXYTOCIN BOLUS FROM INFUSION: 500.0000 mL | INTRAVENOUS | Status: DC

## 2013-12-16 MED ORDER — LURASIDONE HCL 40 MG PO TABS
20.0000 mg | ORAL_TABLET | Freq: Every day | ORAL | Status: DC
  Administered 2013-12-16 – 2013-12-22 (×7): 20 mg via ORAL
  Filled 2013-12-16 (×8): qty 1

## 2013-12-16 MED ORDER — OXYTOCIN 40 UNITS IN LACTATED RINGERS INFUSION - SIMPLE MED: 62.5000 mL/h | INTRAVENOUS | Status: DC

## 2013-12-16 MED ORDER — SODIUM CHLORIDE 0.9 % IV SOLN
2.0000 g | Freq: Four times a day (QID) | INTRAVENOUS | Status: DC
  Administered 2013-12-16 – 2013-12-17 (×3): 2 g via INTRAVENOUS
  Filled 2013-12-16 (×4): qty 2000

## 2013-12-16 NOTE — Progress Notes (Addendum)
Tamara Briggs is a 28 y.o. G3P1011 at 8063w0d by LMP admitted for rupture of membranes  Subjective: Contractions significantly decreased. Pt is comfortable. No issues  Objective: BP 140/63  Pulse 104  Temp(Src) 98.4 F (36.9 C) (Oral)  Resp 18  Ht 5\' 4"  (1.626 m)  Wt 51.71 kg (114 lb)  BMI 19.56 kg/m2  SpO2 99%  LMP 02/26/2013 I/O last 3 completed shifts: In: 490.4 [P.O.:120; I.V.:320.4; IV Piggyback:50] Out: 500 [Urine:500]   FHT: 150s, mod var, mult 10x10 accels, no decels UC:   Rare irregular ctx SVE:   Dilation:  (unchanged) Effacement (%): 40 Station: -3 Exam by:: Dr. Ike Benedom vertex  Labs: Lab Results  Component Value Date   WBC 8.3 12/16/2013   HGB 9.2* 12/16/2013   HCT 28.2* 12/16/2013   MCV 87.3 12/16/2013   PLT 130* 12/16/2013    Assessment / Plan: Tamara Briggs is a 28 y.o. G3P1011 at 6463w0d by L=28 presented with ROM. Pt has been on Ante where she started having painful ctx on 4/25. Pt previously had been without issue. SSE appeared closed to minimally open. SVE showed 1.5/40/-3.   Labor: appears to have successful tocolysis with mag. Will cont 12 hrs and tx back to Ante in AM Preeclampsia:  No issues Fetal Wellbeing:  Category I Pain Control:  Fentanyl 50mcg PRN severe pain I/D: Transition ampicillin to IV while on L&D, will continue azithromycin in the hopes of tocolysis. Anticipated MOD:  NSVD and ideally tocolyse HIV: will continue home medications, unable to give AZT in line with mag. No cervical change. Will not tx at this time.  Tamara BalsamMichael R Briggs Tamara Briggs 12/16/2013, 9:11 PM

## 2013-12-16 NOTE — Progress Notes (Signed)
Patient ID: Tamara Briggs, female   DOB: 11/27/1985, 28 y.o.   MRN: [id number] Patient ID: Tamara Briggs, female   DOB: 01/14/1986, 28 y.o.   MRN: [id number] FACULTY PRACTICE ANTEPARTUM(COMPREHENSIVE) NOTE  Tamara Briggs is a 28 y.o. G3P1011 at 2126w0d by midtrimester ultrasound who is admitted for PROM.   Fetal presentation is cephalic. Length of Stay:  2  Days  Subjective: No complaint Patient reports the fetal movement as active. Patient reports uterine contraction  activity as none. Patient reports  vaginal bleeding as none. Patient describes fluid per vagina as Clear.  Vitals:  Blood pressure 111/57, pulse 86, temperature 98.9 F (37.2 C), temperature source Oral, resp. rate 20, height 5\' 4"  (1.626 m), weight 114 lb (51.71 kg), last menstrual period 02/26/2013, SpO2 100.00%. Physical Examination:  General appearance - alert, well appearing, and in no distress Heart - normal rate and regular rhythm Abdomen - soft, nontender, nondistended Fundal Height:  size equals dates Cervical Exam: Not evaluated. Extremities: extremities normal,  Membranes:ruptured, clear fluid  Fetal Monitoring:  Baseline: 150 bpm, Variability: Good {> 6 bpm), Accelerations: Reactive and Decelerations: Absent  Labs:  No results found for this or any previous visit (from the past 24 hour(s)).  Imaging Studies:     Currently EPIC will not allow sonographic studies to automatically populate into notes.  In the meantime, copy and paste results into note or free text.  Medications:  Scheduled . amoxicillin  500 mg Oral Q8H  . atazanavir  400 mg Oral Q breakfast  . azithromycin  500 mg Oral Daily  . docusate sodium  100 mg Oral Daily  . emtricitabine-tenofovir  1 tablet Oral Daily  . feeding supplement (ENSURE COMPLETE)  237 mL Oral BID BM  . lurasidone  20 mg Oral Daily  . prenatal multivitamin  1 tablet Oral Q1200  . ritonavir  100 mg Oral Q breakfast  . sertraline  50 mg Oral Daily   I have reviewed the  patient's current medications.  ASSESSMENT: Patient Active Problem List   Diagnosis Date Noted  . Preterm premature rupture of membranes (PPROM) delivered, current hospitalization 12/14/2013  . Normocytic anemia 12/14/2013  . 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 Abnormal findings on antenatal screening 10/27/2013  . Maternal HIV positive complicating pregnancy in second trimester, antepartum 10/18/2013  . Schizophrenia 10/18/2013  . Pyelonephritis 07/21/2013  . Protein-calorie malnutrition, severe 03/16/2013  . Tobacco abuse 03/15/2013  . h/o crack Cocaine abuse 03/15/2013  . h/o oral Thrush 03/15/2013  . HIV (human immunodeficiency virus infection) 07/27/2011  . Chlamydia and Gonorrhea 07/27/2011  . H/O abnormal cervical Papanicolaou smear 07/26/2008    PLAN: Expectant management, delivery for fetal indication or PTL, chorio.  Amaryllis DykeLuther H Latese Dufault 12/16/2013,7:46 AM

## 2013-12-16 NOTE — Progress Notes (Signed)
Tamara Briggs is a 28 y.o. G3P1011 at 7429w0d by LMP admitted for rupture of membranes  Subjective: Called to evaluate patient for painful contractions that started. Pt is having coming and going. Pt has been stable before this and had not been having other complaints  Objective: BP 121/67  Pulse 107  Temp(Src) 99.4 F (37.4 C) (Oral)  Resp 20  Ht 5\' 4"  (1.626 m)  Wt 51.71 kg (114 lb)  BMI 19.56 kg/m2  SpO2 100%  LMP 02/26/2013 I/O last 3 completed shifts: In: 2048.3 [P.O.:120; I.V.:1328.3; IV Piggyback:600] Out: 3250 [Urine:3250]    UC:   irregular, every 3-5 minutes SVE:   Dilation: 1.5 (speculum exam appeared closed) Effacement (%): 40 Station: -3 Exam by:: Dr. Ike Benedom vertex  Labs: Lab Results  Component Value Date   WBC 9.1 12/15/2013   HGB 9.7* 12/15/2013   HCT 29.4* 12/15/2013   MCV 86.5 12/15/2013   PLT 131* 12/15/2013    Assessment / Plan: Tamara Briggs is a 28 y.o. G3P1011 at 2429w0d by L=28 presented with ROM. Pt has been on Ante where she started having painful ctx on 4/25. Pt previously had been without issue. SSE appeared closed to minimally open. SVE showed 1.5/40/-3. May transfer back to ante if able to halt labor.  Labor: Will attempt to tocolyse with magnesium 4/2 with benefit of neuro protection Preeclampsia:  No issues Fetal Wellbeing:  Category I on last NST. will start constant monitoring Pain Control:  Fentanyl 50mcg PRN severe pain I/D:  Transition ampicillin to IV while on L&D, will continue azithromycin in the hopes of tocolysis. Anticipated MOD:  NSVD and ideally tocolyse HIV: will continue home medications, start on AZT IV at this time. If labor stops will d/c.   Minta BalsamMichael R Aldridge Krzyzanowski 12/16/2013, 4:20 PM

## 2013-12-16 NOTE — Progress Notes (Signed)
Pt's significant other agitated and complains that social worker was threatening. He plans to confront her on Monday. Will update security.

## 2013-12-17 DIAGNOSIS — O4290 Premature rupture of membranes, unspecified as to length of time between rupture and onset of labor, unspecified weeks of gestation: Secondary | ICD-10-CM

## 2013-12-17 MED ORDER — AMOXICILLIN 500 MG PO CAPS
500.0000 mg | ORAL_CAPSULE | Freq: Three times a day (TID) | ORAL | Status: AC
  Administered 2013-12-17 – 2013-12-21 (×15): 500 mg via ORAL
  Filled 2013-12-17 (×15): qty 1

## 2013-12-17 NOTE — Progress Notes (Signed)
Dr. Ike Benedom at bedside, discussing with pt plan of care to transfer back to antenatal, pt verbalizes understanding

## 2013-12-17 NOTE — Progress Notes (Signed)
Detailed message left with Nobie Putnamedra Slade phone mail to have security as close presence tomorrow when speaking to pt and husband Monday. This morning FOB states he is going home to clean and wash clothes but plans to be back tonight to spend the night to be here tomorrow when social work speaks to family. RN who cared for pt yesterday states [facility] and [facility] with [facility] is a statement to say their family is homeless and he does not appreciate it.

## 2013-12-17 NOTE — Progress Notes (Signed)
FACULTY PRACTICE ANTEPARTUM(COMPREHENSIVE) NOTE  Tamara Briggs is a 28 y.o. G3P1011 at 3135w1d by LMP who is admitted for PPROM.   Fetal presentation is cephalic. Length of Stay:  3  Days  Subjective: Pt was tx to L&D yesterday for having painful contractions. Pt was given course of magnesium and contractions resolved. Pt has been off mag for 3hr without recurrence. Patient reports the fetal movement as active. Patient reports uterine contraction  activity as none. Patient reports  vaginal bleeding as none. Patient describes fluid per vagina as Clear.  Vitals:  Blood pressure 101/49, pulse 95, temperature 97.6 F (36.4 C), temperature source Oral, resp. rate 18, height 5\' 4"  (1.626 m), weight 51.71 kg (114 lb), last menstrual period 02/26/2013, SpO2 99.00%. Physical Examination:  General appearance - alert, well appearing, and in no distress and oriented to person, place, and time Heart - normal rate and regular rhythm Abdomen - soft, nontender, nondistended Fundal Height:  size equals dates Cervical Exam:  Not reexamined today. Yesterday 1.5/40/-3, Spec exam unchanged at 2100 last night. Extremities: extremities normal, atraumatic, no cyanosis or edema and Homans sign is negative, no sign of DVT with DTRs 2+ bilaterally Membranes:ruptured, clear fluid  Fetal Monitoring:  Baseline:  150s bpm, Variability: Good {> 6 bpm), Accelerations: Reactive and Decelerations: Absent  Labs:  Results for orders placed during the hospital encounter of 12/14/13 (from the past 24 hour(s))  CBC   Collection Time    12/16/13  5:15 PM      Result Value Ref Range   WBC 8.3  4.0 - 10.5 K/uL   RBC 3.23 (*) 3.87 - 5.11 MIL/uL   Hemoglobin 9.2 (*) 12.0 - 15.0 g/dL   HCT 40.928.2 (*) 81.136.0 - 91.446.0 %   MCV 87.3  78.0 - 100.0 fL   MCH 28.5  26.0 - 34.0 pg   MCHC 32.6  30.0 - 36.0 g/dL   RDW 78.214.3  95.611.5 - 21.315.5 %   Platelets 130 (*) 150 - 400 K/uL    Imaging Studies:     Medications:  Scheduled . ampicillin  (OMNIPEN) IV  2 g Intravenous 4 times per day  . atazanavir  400 mg Oral Q breakfast  . azithromycin  500 mg Oral Daily  . emtricitabine-tenofovir  1 tablet Oral Daily  . feeding supplement (ENSURE COMPLETE)  237 mL Oral BID BM  . lurasidone  20 mg Oral Daily  . prenatal multivitamin  1 tablet Oral Q1200  . ritonavir  100 mg Oral Q breakfast  . sertraline  50 mg Oral Daily  . zidovudine (RETROVIR) loading dose  2 mg/kg Intravenous Once   I have reviewed the patient's current medications.  ASSESSMENT: Tamara Briggs is a 28 y.o. G3P1011 at 2335w1d by LMP who is admitted for PPROM.    Patient Active Problem List   Diagnosis Date Noted  . Preterm premature rupture of membranes (PPROM) delivered, current hospitalization 12/14/2013  . Normocytic anemia 12/14/2013  . 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 Abnormal findings on antenatal screening 10/27/2013  . Maternal HIV positive complicating pregnancy in second trimester, antepartum 10/18/2013  . Schizophrenia 10/18/2013  . Pyelonephritis 07/21/2013  . Protein-calorie malnutrition, severe 03/16/2013  . Tobacco abuse 03/15/2013  . h/o crack Cocaine abuse 03/15/2013  . h/o oral Thrush 03/15/2013  . HIV (human immunodeficiency virus infection) 07/27/2011  . Chlamydia and Gonorrhea 07/27/2011  . H/O abnormal cervical Papanicolaou smear 07/26/2008    PLAN: Pt contractions resolved. Tx back to Antepartum  unit. Continue expectant management Delivery for fetal indication, PTL or chorio Continue latency antibiotics Continue HIV medications Continue to work with family regarding social situation. SW involved and aware of drug screen.   Suszanne ConnersMichael R Kimbery Harwood 12/17/2013,7:09 AM

## 2013-12-17 NOTE — Progress Notes (Signed)
Tamara Briggs, in security notiifed of secuirty status for tomorrow with social work and he will notifiy co-workers-notified in safety rounds.

## 2013-12-17 NOTE — Progress Notes (Signed)
Pt transferred to room 153, pt chose to walk to her room.

## 2013-12-18 ENCOUNTER — Encounter: Payer: Self-pay | Admitting: *Deleted

## 2013-12-18 LAB — CULTURE, BETA STREP (GROUP B ONLY)

## 2013-12-18 NOTE — Progress Notes (Signed)
Monitors removed at pt. request. Pt. States she is no longer cramping and doesn't want to wear the monitors.

## 2013-12-18 NOTE — Progress Notes (Signed)
Ur chart review completed.  

## 2013-12-18 NOTE — Progress Notes (Signed)
Patient ID: Tamara Briggs, female   DOB: 06/08/1986, 28 y.o.   MRN: [id number] FACULTY PRACTICE ANTEPARTUM(COMPREHENSIVE) NOTE  Tamara Briggs is a 28 y.o. G3P1011 at 5860w2d by LMP who is admitted for PPROM.   Fetal presentation is cephalic. Length of Stay:  4  Days   Subjective: Pt was tx to L&D yesterday for having painful contractions. Pt was given course of magnesium and contractions resolved. Pt has been off mag for 3hr without recurrence. Patient reports the fetal movement as active. Patient reports uterine contraction  activity as none. Patient reports  vaginal bleeding as none. Patient describes fluid per vagina as Clear.  Vitals:  Blood pressure 113/54, pulse 101, temperature 98.7 F (37.1 C), temperature source Oral, resp. rate 16, height 5\' 4"  (1.626 m), weight 114 lb (51.71 kg), last menstrual period 02/26/2013, SpO2 99.00%. Physical Examination:  General appearance - alert, well appearing, and in no distress and oriented to person, place, and time Heart - normal rate and regular rhythm Abdomen - soft, nontender, nondistended Fundal Height:  size equals dates Cervical Exam:  Not reexamined today. Yesterday 1.5/40/-3, Spec exam unchanged at 2100 last night. Extremities: extremities normal, atraumatic, no cyanosis or edema and Homans sign is negative, no sign of DVT with DTRs 2+ bilaterally Membranes:ruptured, clear fluid  Fetal Monitoring:  Baseline:  150s bpm, Variability: Good {> 6 bpm), Accelerations: Reactive and Decelerations: Absent  Labs:  No results found for this or any previous visit (from the past 24 hour(s)).  Imaging Studies:     Medications:  Scheduled . amoxicillin  500 mg Oral 3 times per day  . atazanavir  400 mg Oral Q breakfast  . azithromycin  500 mg Oral Daily  . emtricitabine-tenofovir  1 tablet Oral Daily  . feeding supplement (ENSURE COMPLETE)  237 mL Oral BID BM  . lurasidone  20 mg Oral Daily  . prenatal multivitamin  1 tablet Oral Q1200  .  ritonavir  100 mg Oral Q breakfast  . sertraline  50 mg Oral Daily   I have reviewed the patient's current medications.  ASSESSMENT: Tamara Briggs is a 28 y.o. G3P1011 at 6760w2d by LMP who is admitted for PPROM.    Patient Active Problem List   Diagnosis Date Noted  . Preterm premature rupture of membranes (PPROM) delivered, current hospitalization 12/14/2013  . Normocytic anemia 12/14/2013  . 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 Abnormal findings on antenatal screening 10/27/2013  . Maternal HIV positive complicating pregnancy in second trimester, antepartum 10/18/2013  . Schizophrenia 10/18/2013  . Pyelonephritis 07/21/2013  . Protein-calorie malnutrition, severe 03/16/2013  . Tobacco abuse 03/15/2013  . h/o crack Cocaine abuse 03/15/2013  . h/o oral Thrush 03/15/2013  . HIV (human immunodeficiency virus infection) 07/27/2011  . Chlamydia and Gonorrhea 07/27/2011  . H/O abnormal cervical Papanicolaou smear 07/26/2008    PLAN: Status post magnesium fo rtocolysis and neurological prophylaxis Continue expectant management Delivery for fetal indication, PTL or chorio Continue latency antibiotics Continue HIV medications Continue to work with family regarding social situation. SW involved and aware of drug screen.   Kidus Delman H Lejon Afzal 12/18/2013,7:41 AM

## 2013-12-18 NOTE — Progress Notes (Signed)
Patient ID: Tamara Briggs, female   DOB: 01/17/1986, 28 y.o.   MRN: [id number]             Day 4 amoxicillin  Subjective: [name] states that she is "hurting".  Objective: Temp: 100.1 F (37.8 C) (04/27 1308) Temp src: Oral (04/27 1308) BP: 118/57 mmHg (04/27 1308) Pulse Rate: 115 (04/27 1308) Body mass index is 19.56 kg/(m^2).  General: She is resting quietly in bed. She appears more comfortable. Oral: No oropharyngeal lesions Skin: No rash Lungs: Clear Cor: Regular S1 and S2 with no murmurs   Lab Results Lab Results  Component Value Date   WBC 8.3 12/16/2013   HGB 9.2* 12/16/2013   HCT 28.2* 12/16/2013   MCV 87.3 12/16/2013   PLT 130* 12/16/2013    Lab Results  Component Value Date   CREATININE 0.46* 12/12/2013   BUN 7 12/12/2013   NA 134* 12/12/2013   K 3.8 12/12/2013   CL 102 12/12/2013   CO2 24 12/12/2013    Lab Results  Component Value Date   ALT 10 12/12/2013   AST 18 12/12/2013   ALKPHOS 97 12/12/2013   BILITOT 0.3 12/12/2013    Lab Results  Component Value Date   CHOL 129 07/26/2012   HDL 43 07/26/2012   LDLCALC 70 07/26/2012   TRIG 81 07/26/2012   CHOLHDL 3.0 07/26/2012    Lab Results HIV 1 RNA Quant (copies/mL)  Date Value  12/12/2013 133*  10/26/2013 142*  04/27/2013 268*     CD4 T Cell Abs (/uL)  Date Value  12/12/2013 290*  10/26/2013 300*  04/27/2013 300*     Assessment: She had more contractions over the weekend. Now some low-grade fever. Despite being on amoxicillin prophylaxis she is at high risk for chorioamnionitis.  Plan: 1. Continue antiretroviral therapy and amoxicillin  Cliffton AstersJohn Adreanna Fickel, MD [facility] [phone] pager   [phone] cell 12/18/2013, 1:54 PM [phone] pager   [phone] cell 12/18/2013, 1:54 PM

## 2013-12-19 MED ORDER — SODIUM CHLORIDE 0.9 % IJ SOLN
3.0000 mL | Freq: Two times a day (BID) | INTRAMUSCULAR | Status: DC
  Administered 2013-12-19 – 2013-12-21 (×5): 3 mL via INTRAVENOUS

## 2013-12-19 NOTE — Progress Notes (Signed)
Patient ID: Tamara Briggs, female   DOB: 04/24/1986, 28 y.o.   MRN: [id number]             Day 5 amoxicillin  Objective: Temp: 99.4 F (37.4 C) (04/28 1330) Temp src: Oral (04/28 1330) BP: 111/75 mmHg (04/28 1145) Pulse Rate: 124 (04/28 1145) Body mass index is 19.56 kg/(m^2).  General: Currently not having any contractions   Lab Results Lab Results  Component Value Date   WBC 8.3 12/16/2013   HGB 9.2* 12/16/2013   HCT 28.2* 12/16/2013   MCV 87.3 12/16/2013   PLT 130* 12/16/2013    Lab Results  Component Value Date   CREATININE 0.46* 12/12/2013   BUN 7 12/12/2013   NA 134* 12/12/2013   K 3.8 12/12/2013   CL 102 12/12/2013   CO2 24 12/12/2013    Lab Results  Component Value Date   ALT 10 12/12/2013   AST 18 12/12/2013   ALKPHOS 97 12/12/2013   BILITOT 0.3 12/12/2013    Lab Results  Component Value Date   CHOL 129 07/26/2012   HDL 43 07/26/2012   LDLCALC 70 07/26/2012   TRIG 81 07/26/2012   CHOLHDL 3.0 07/26/2012    Lab Results HIV 1 RNA Quant (copies/mL)  Date Value  12/12/2013 133*  10/26/2013 142*  04/27/2013 268*     CD4 T Cell Abs (/uL)  Date Value  12/12/2013 290*  10/26/2013 300*  04/27/2013 300*     Assessment: She remains afebrile and is currently not having any contractions. The current plan is for her to remain hospitalized through delivery. I would simply continue her HIV therapy and prophylactic amoxicillin.  Plan: 1. Continue antiretroviral therapy and amoxicillin 2. I will sign off now please call us if we can be of any further assistance while she is hospitalized  Cliffton AstersJohn Aphrodite Harpenau, MD [facility] [phone] pager   [phone] cell 12/19/2013, 2:09 PM [phone] pager   [phone] cell 12/19/2013, 2:09 PM

## 2013-12-19 NOTE — Progress Notes (Signed)
Patient ID: Tamara Briggs, female   DOB: 02/01/1986, 28 y.o.   MRN: [id number] ACULTY PRACTICE ANTEPARTUM COMPREHENSIVE PROGRESS NOTE  Tamara Briggs is a 28 y.o. G3P1011 at 6083w3d  who is admitted for PROM.   Fetal presentation is cephalic. Length of Stay:  5  Days  Subjective: Pt denies pain or other problems Patient reports good fetal movement.  She reports no uterine contractions, no bleeding but, reports continued loss of fluid per vagina.  Vitals:  Blood pressure 100/58, pulse 110, temperature 98.6 F (37 C), temperature source Oral, resp. rate 18, height 5\' 4"  (1.626 m), weight 114 lb (51.71 kg), last menstrual period 02/26/2013, SpO2 99.00%. Physical Examination: General appearance - alert, well appearing, and in no distress Abdomen - soft, nontender, nondistended, no masses or organomegaly gravid Extremities - peripheral pulses normal, no pedal edema, no clubbing or cyanosis Cervical Exam: Not evaluated.  Membranes:ruptured  Fetal Monitoring:  Baseline: 150's bpm, Variability: Good {> 6 bpm), Accelerations: Reactive and Decelerations: Absent  Labs:  No results found for this or any previous visit (from the past 24 hour(s)).  Medications:  Scheduled . amoxicillin  500 mg Oral 3 times per day  . atazanavir  400 mg Oral Q breakfast  . azithromycin  500 mg Oral Daily  . emtricitabine-tenofovir  1 tablet Oral Daily  . feeding supplement (ENSURE COMPLETE)  237 mL Oral BID BM  . lurasidone  20 mg Oral Daily  . prenatal multivitamin  1 tablet Oral Q1200  . ritonavir  100 mg Oral Q breakfast  . sertraline  50 mg Oral Daily   I have reviewed the patient's current medications.  ASSESSMENT: Patient Active Problem List   Diagnosis Date Noted  . Preterm premature rupture of membranes (PPROM) delivered, current hospitalization 12/14/2013  . Normocytic anemia 12/14/2013  . 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 Abnormal findings on antenatal screening 10/27/2013  . Maternal HIV  positive complicating pregnancy in second trimester, antepartum 10/18/2013  . Schizophrenia 10/18/2013  . Pyelonephritis 07/21/2013  . Protein-calorie malnutrition, severe 03/16/2013  . Tobacco abuse 03/15/2013  . h/o crack Cocaine abuse 03/15/2013  . h/o oral Thrush 03/15/2013  . HIV (human immunodeficiency virus infection) 07/27/2011  . Chlamydia and Gonorrhea 07/27/2011  . H/O abnormal cervical Papanicolaou smear 07/26/2008    PLAN: Continue inpt care until delivery Deliver for maternal or fetal indicaitons  Continue routine antenatal care.   Teira Arcilla Harraway-Smith 12/19/2013,12:30 PM

## 2013-12-19 NOTE — Progress Notes (Signed)
Pt. Out for wheelchair ride for 30 min. Instructed not to leave property.

## 2013-12-20 NOTE — Progress Notes (Signed)
Pt. Refusing fetal monitoring at this time

## 2013-12-20 NOTE — Progress Notes (Signed)
Pt. States is ready for NST. Monitors applied

## 2013-12-20 NOTE — Progress Notes (Signed)
SCDs placed with patient's consent. Pt.'s significant other expressed concern that Dr. Lowry RamHaraway-Smith said she only needed to wear them at night. He asked the patient if she wanted to wear them now and patient replied that she does. Pt. And sig. Other reassured that SCDs can be removed if pt. Decides she would rather not wear them during the day.

## 2013-12-20 NOTE — Progress Notes (Signed)
Pt. Continues to refuse NST at present.

## 2013-12-20 NOTE — Progress Notes (Signed)
Patient ID: Gretta CoolMaysha Selke, female   DOB: 10/05/1985, 28 y.o.   MRN: [id number] ACULTY PRACTICE ANTEPARTUM COMPREHENSIVE PROGRESS NOTE  Shanvi Solon AugustaBynum is a 28 y.o. G3P1011 at 2774w4d  who is admitted for PROM.   Fetal presentation is transverse. Length of Stay:  6  Days  Subjective: Pt with no complaints.  Her partner is concerned about the care she is receiving.  Worried about painful IV placements. Patient reports good fetal movement.  She reports no uterine contractions, no bleeding and no loss of fluid per vagina.  Vitals:  Blood pressure 118/65, pulse 100, temperature 98.5 F (36.9 C), temperature source Oral, resp. rate 18, height 5\' 4"  (1.626 m), weight 114 lb (51.71 kg), last menstrual period 02/26/2013, SpO2 99.00%. Physical Examination: General appearance - alert, well appearing, and in no distress Abdomen - soft, nontender, nondistended, no masses or organomegaly gravid Extremities - peripheral pulses normal, no pedal edema, no clubbing or cyanosis Cervical Exam: Not evaluated.  Membranes:ruptured  Fetal Monitoring:  Baseline: 140's bpm, Variability: Good {> 6 bpm), Accelerations: Reactive and Decelerations: Absent  Labs:  No results found for this or any previous visit (from the past 24 hour(s)).  Medications:  Scheduled . amoxicillin  500 mg Oral 3 times per day  . atazanavir  400 mg Oral Q breakfast  . azithromycin  500 mg Oral Daily  . emtricitabine-tenofovir  1 tablet Oral Daily  . feeding supplement (ENSURE COMPLETE)  237 mL Oral BID BM  . lurasidone  20 mg Oral Daily  . prenatal multivitamin  1 tablet Oral Q1200  . ritonavir  100 mg Oral Q breakfast  . sertraline  50 mg Oral Daily  . sodium chloride  3 mL Intravenous Q12H   I have reviewed the patient's current medications.  ASSESSMENT: Patient Active Problem List   Diagnosis Date Noted  . Preterm premature rupture of membranes (PPROM) delivered, current hospitalization 12/14/2013  . Normocytic anemia 12/14/2013   . 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 Abnormal findings on antenatal screening 10/27/2013  . Maternal HIV positive complicating pregnancy in second trimester, antepartum 10/18/2013  . Schizophrenia 10/18/2013  . Pyelonephritis 07/21/2013  . Protein-calorie malnutrition, severe 03/16/2013  . Tobacco abuse 03/15/2013  . h/o crack Cocaine abuse 03/15/2013  . h/o oral Thrush 03/15/2013  . HIV (human immunodeficiency virus infection) 07/27/2011  . Chlamydia and Gonorrhea 07/27/2011  . H/O abnormal cervical Papanicolaou smear 07/26/2008    PLAN:  Continue routine antenatal care. Deliver at 34 weeks or sooner for fetal or maternal indications   Armonie Staten Harraway-Smith 12/20/2013,8:15 AM

## 2013-12-21 LAB — RAPID URINE DRUG SCREEN, HOSP PERFORMED
Amphetamines: NOT DETECTED
BARBITURATES: NOT DETECTED
Benzodiazepines: NOT DETECTED
Cocaine: NOT DETECTED
Opiates: NOT DETECTED
TETRAHYDROCANNABINOL: NOT DETECTED

## 2013-12-21 NOTE — Progress Notes (Signed)
12/21/13 1100  Clinical Encounter Type  Visited With Patient  Visit Type Initial  Referral From Chaplain  Spiritual Encounters  Spiritual Needs Emotional   Consulted with Nobie Putnamedra Slade, LCSW prior to visit for information about social context.  Visited briefly with Mikailah to introduce [place] and chaplain availability.  She was quiet and did not engage in conversation, but thanked me for coming and welcomed me to come back.  Michigamme will follow for developing rapport and to be available for support.  Please also page as needs arise:  [phone].  Thank you.  [address]Chaplain Jiovanna Frei [place], [place] [phone]

## 2013-12-21 NOTE — Progress Notes (Signed)
FACULTY PRACTICE ANTEPARTUM(COMPREHENSIVE) NOTE  Tamara Briggs is a 28 y.o. G3P1011 at 6265w5d by midtrimester ultrasound who is admitted for rupture of membranes, PROM.   Fetal presentation is cephalic and there was notation yesterday that she was transverse, but on cervical exam earlier this week and at admit , she was vertex..Will need to confirm by u/s prior to delivery Length of Stay:  7  Days  Subjective: No complaints. Patient reports the fetal movement as active. Patient reports uterine contraction  activity as none. Patient reports  vaginal bleeding as none. Patient describes fluid per vagina as Clear.  Vitals:  Blood pressure 109/81, pulse 119, temperature 98.4 F (36.9 C), temperature source Oral, resp. rate 18, height 5\' 4"  (1.626 m), weight 109 lb 4.8 oz (49.578 kg), last menstrual period 02/26/2013, SpO2 99.00%. Physical Examination:  General appearance - alert, well appearing, and in no distress Heart - normal rate and regular rhythm Abdomen - soft, nontender, nondistended Fundal Height:  size equals dates Cervical Exam: Not evaluated.  Extremities: extremities normal, atraumatic, no cyanosis or edema and Homans sign is negative, no sign of DVT with DTRs 2+ bilaterally Membranes:intact, ruptured  Fetal Monitoring:  Baseline: 145 bpm, Variability: Good {> 6 bpm) and Accelerations: Non-reactive but appropriate for gestational age  Labs:  No results found for this or any previous visit (from the past 24 hour(s)).  Imaging Studies:     Currently EPIC will not allow sonographic studies to automatically populate into notes.  In the meantime, copy and paste results into note or free text.  Medications:  Scheduled . amoxicillin  500 mg Oral 3 times per day  . atazanavir  400 mg Oral Q breakfast  . emtricitabine-tenofovir  1 tablet Oral Daily  . feeding supplement (ENSURE COMPLETE)  237 mL Oral BID BM  . lurasidone  20 mg Oral Daily  . prenatal multivitamin  1 tablet Oral  Q1200  . ritonavir  100 mg Oral Q breakfast  . sertraline  50 mg Oral Daily  . sodium chloride  3 mL Intravenous Q12H   I have reviewed the patient's current medications.  ASSESSMENT: Patient Active Problem List   Diagnosis Date Noted  . Preterm premature rupture of membranes (PPROM) delivered, current hospitalization 12/14/2013  . Normocytic anemia 12/14/2013  . 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 Abnormal findings on antenatal screening 10/27/2013  . Maternal HIV positive complicating pregnancy in second trimester, antepartum 10/18/2013  . Schizophrenia 10/18/2013  . Pyelonephritis 07/21/2013  . Protein-calorie malnutrition, severe 03/16/2013  . Tobacco abuse 03/15/2013  . h/o crack Cocaine abuse 03/15/2013  . h/o oral Thrush 03/15/2013  . HIV (human immunodeficiency virus infection) 07/27/2011  . Chlamydia and Gonorrhea 07/27/2011  . H/O abnormal cervical Papanicolaou smear 07/26/2008    PLAN: *PLAN:  Continue routine antenatal care.  Deliver at 34 weeks or sooner for fetal or maternal indications  Confirm presentation if onset of labor or induction.  **  Tilda BurrowJohn V Tanayah Squitieri 12/21/2013,9:33 AM

## 2013-12-21 NOTE — Progress Notes (Signed)
Antenatal Nutrition Assessment:  Currently  28 5/[redacted] weeks gestation, with PROM. Height  64 "  Weight 109 lbs  pre-pregnancy weight 95 lbs .  Pre-pregnancy  BMI 16.4  IBW 120 lbs Total weight gain 14.lbs Weight gain goals 28-40 lbs Estimated needs: 16-1800 kcal/day, 57-67 grams protein/day, 2 liters fluid/day  Regular diet tolerated well, appetite good. Pt underweight prior to conception, adeq weight gain. Changed diet order to allow snacks and double protein portions if pt wishes. Is drinking milk for calcium intake. Current diet prescription will provide for increased needs.  No abnormal nutrition related labs  Nutrition Dx: Increased nutrient needs r/t pregnancy and fetal growth requirements aeb [redacted] weeks gestation.  No educational needs assessed at this time.  Tamara Briggs Neonatal Nutrition Support Specialist Pager [phone]

## 2013-12-21 NOTE — Progress Notes (Signed)
UR completed 

## 2013-12-21 NOTE — Progress Notes (Signed)
Pt encouraged to wear SCD's.  Pt refused.  FOB present.

## 2013-12-22 DIAGNOSIS — O4290 Premature rupture of membranes, unspecified as to length of time between rupture and onset of labor, unspecified weeks of gestation: Principal | ICD-10-CM

## 2013-12-22 NOTE — Progress Notes (Signed)
12/22/13 1600  Clinical Encounter Type  Visited With Patient and family together  Visit Type Follow-up  Referral From Nurse  Stress Factors  Patient Stress Factors Loss of control;Major life changes (desperate to go home instead of staying in hospital)   [facility].  [name] was upset about having to stay in the hospital, when she so desperately wants to be home, and was shouting into phone during visit.  Provided calming pastoral presence, reflective listening, and emotional support.  [place] is following, but please also page as needs arise:  [phone].  Thank you.  [address]Chaplain Vinicius Brockman [place], [place] [phone]

## 2013-12-22 NOTE — Progress Notes (Signed)
Patient upset and crying, wanting to go home. Tired of being here in hospital. Talked with patient and mother, informed of reasons for hospitalization. Receptive.

## 2013-12-22 NOTE — Progress Notes (Signed)
Received phone call, stating that our antenatal patient sitting in wheelchair at bus stop.

## 2013-12-22 NOTE — Progress Notes (Signed)
Patient ID: Tamara Briggs, female   DOB: 06/19/1986, 28 y.o.   MRN: [id number] FACULTY PRACTICE ANTEPARTUM(COMPREHENSIVE) NOTE  Tamara Briggs is a 28 y.o. G3P1011 at 5857w6d  who is admitted for PPROM.   Length of Stay:  8  Days  Subjective: Patient reports the fetal movement as active. Patient reports uterine contraction  activity as none. Patient reports  vaginal bleeding as none. Patient describes fluid per vagina as Clear.  Vitals:  Blood pressure 137/68, pulse 107, temperature 98.7 F (37.1 C), temperature source Oral, resp. rate 18, height 5\' 4"  (1.626 m), weight 109 lb 4.8 oz (49.578 kg), last menstrual period 02/26/2013, SpO2 99.00%. Physical Examination:  General appearance - alert, well appearing, and in no distress Abdomen - soft, NT Extremities - no edema, redness or tenderness in the calves or thighs, Homan's sign negative bilaterally  Fetal Monitoring:  Baseline: 150 bpm, Variability: Good {> 6 bpm), Accelerations: Reactive and Decelerations: Absent  Labs:  Results for orders placed during the hospital encounter of 12/14/13 (from the past 24 hour(s))  URINE RAPID DRUG SCREEN (HOSP PERFORMED)   Collection Time    12/21/13 11:44 AM      Result Value Ref Range   Opiates NONE DETECTED  NONE DETECTED   Cocaine NONE DETECTED  NONE DETECTED   Benzodiazepines NONE DETECTED  NONE DETECTED   Amphetamines NONE DETECTED  NONE DETECTED   Tetrahydrocannabinol NONE DETECTED  NONE DETECTED   Barbiturates NONE DETECTED  NONE DETECTED    Imaging Studies:      Medications:  Scheduled . atazanavir  400 mg Oral Q breakfast  . emtricitabine-tenofovir  1 tablet Oral Daily  . feeding supplement (ENSURE COMPLETE)  237 mL Oral BID BM  . lurasidone  20 mg Oral Daily  . prenatal multivitamin  1 tablet Oral Q1200  . ritonavir  100 mg Oral Q breakfast  . sertraline  50 mg Oral Daily  . sodium chloride  3 mL Intravenous Q12H   I have reviewed the patient's current  medications.  ASSESSMENT: Patient Active Problem List   Diagnosis Date Noted  . History of neural tube defect 11/16/2013    Priority: High  . Abnormal findings on antenatal screening 10/27/2013    Priority: High  . Maternal HIV positive complicating pregnancy in second trimester, antepartum 10/18/2013    Priority: High  . Schizophrenia 10/18/2013    Priority: High  . Protein-calorie malnutrition, severe 03/16/2013    Priority: High  . h/o crack Cocaine abuse 03/15/2013    Priority: High  . HIV (human immunodeficiency virus infection) 07/27/2011    Priority: High  . Tobacco abuse 03/15/2013    Priority: Medium  . Pyelonephritis 07/21/2013    Priority: Low  . h/o oral Thrush 03/15/2013    Priority: Low  . Chlamydia and Gonorrhea 07/27/2011    Priority: Low  . H/O abnormal cervical Papanicolaou smear 07/26/2008    Priority: Low  . Preterm premature rupture of membranes (PPROM) delivered, current hospitalization 12/14/2013  . Normocytic anemia 12/14/2013    PLAN: 28 yo female with PPROM and schizophrenia and HIV  1-continue to monitor for signs of infection and labor 2-Growth q3 weeks 3-Continue HIV meds and consult ID as necessary  Mellon FinancialKelly H Avyukt Cimo 12/22/2013,7:17 AM

## 2013-12-22 NOTE — Progress Notes (Signed)
Patient back in room. Accompanied by Ronald Lobo. Gainey-Lloyd NT in wheelchair. Informed patient and mother on not going to bus stop, since that is not part of wheelchair privileges. Receptive.

## 2013-12-22 NOTE — Progress Notes (Signed)
Going on wheelchair ride with mother.

## 2013-12-23 ENCOUNTER — Encounter (HOSPITAL_COMMUNITY): Payer: Self-pay

## 2013-12-23 ENCOUNTER — Inpatient Hospital Stay (HOSPITAL_COMMUNITY)
Admission: AD | Admit: 2013-12-23 | Discharge: 2013-12-26 | DRG: 765 | Disposition: A | Discharge status: Home / Self Care | Payer: Medicaid Other | Source: Ambulatory Visit | Attending: Obstetrics & Gynecology | Admitting: Obstetrics & Gynecology

## 2013-12-23 DIAGNOSIS — Z9889 Other specified postprocedural states: Secondary | ICD-10-CM

## 2013-12-23 DIAGNOSIS — O411090 Infection of amniotic sac and membranes, unspecified, unspecified trimester, not applicable or unspecified: Secondary | ICD-10-CM

## 2013-12-23 DIAGNOSIS — O98519 Other viral diseases complicating pregnancy, unspecified trimester: Secondary | ICD-10-CM

## 2013-12-23 DIAGNOSIS — O9989 Other specified diseases and conditions complicating pregnancy, childbirth and the puerperium: Secondary | ICD-10-CM

## 2013-12-23 DIAGNOSIS — Z22330 Carrier of Group B streptococcus: Secondary | ICD-10-CM

## 2013-12-23 DIAGNOSIS — Z21 Asymptomatic human immunodeficiency virus [HIV] infection status: Secondary | ICD-10-CM | POA: Diagnosis present

## 2013-12-23 DIAGNOSIS — F341 Dysthymic disorder: Secondary | ICD-10-CM | POA: Diagnosis present

## 2013-12-23 DIAGNOSIS — F1410 Cocaine abuse, uncomplicated: Secondary | ICD-10-CM

## 2013-12-23 DIAGNOSIS — O42919 Preterm premature rupture of membranes, unspecified as to length of time between rupture and onset of labor, unspecified trimester: Secondary | ICD-10-CM | POA: Diagnosis present

## 2013-12-23 DIAGNOSIS — O4290 Premature rupture of membranes, unspecified as to length of time between rupture and onset of labor, unspecified weeks of gestation: Secondary | ICD-10-CM | POA: Diagnosis present

## 2013-12-23 DIAGNOSIS — O99814 Abnormal glucose complicating childbirth: Secondary | ICD-10-CM | POA: Diagnosis present

## 2013-12-23 DIAGNOSIS — O99334 Smoking (tobacco) complicating childbirth: Secondary | ICD-10-CM | POA: Diagnosis present

## 2013-12-23 DIAGNOSIS — F209 Schizophrenia, unspecified: Secondary | ICD-10-CM

## 2013-12-23 DIAGNOSIS — O99344 Other mental disorders complicating childbirth: Secondary | ICD-10-CM | POA: Diagnosis present

## 2013-12-23 DIAGNOSIS — O99892 Other specified diseases and conditions complicating childbirth: Secondary | ICD-10-CM | POA: Diagnosis present

## 2013-12-23 DIAGNOSIS — B20 Human immunodeficiency virus [HIV] disease: Secondary | ICD-10-CM

## 2013-12-23 DIAGNOSIS — Z98891 History of uterine scar from previous surgery: Secondary | ICD-10-CM

## 2013-12-23 LAB — GLUCOSE, CAPILLARY
GLUCOSE-CAPILLARY: 154 mg/dL — AB (ref 70–99)
Glucose-Capillary: 162 mg/dL — ABNORMAL HIGH (ref 70–99)

## 2013-12-23 LAB — GLUCOSE TOLERANCE, 1 HOUR: GLUCOSE 1 HOUR GTT: 208 mg/dL — AB (ref 70–140)

## 2013-12-23 MED ORDER — CALCIUM CARBONATE ANTACID 500 MG PO CHEW: 2.0000 | CHEWABLE_TABLET | ORAL | Status: DC | PRN

## 2013-12-23 MED ORDER — ENSURE COMPLETE PO LIQD
237.0000 mL | Freq: Two times a day (BID) | ORAL | Status: DC
  Administered 2013-12-24 – 2013-12-25 (×2): 237 mL via ORAL
  Filled 2013-12-23 (×4): qty 237

## 2013-12-23 MED ORDER — FENTANYL CITRATE 0.05 MG/ML IJ SOLN: 50.0000 ug | Freq: Once | INTRAMUSCULAR | Status: DC

## 2013-12-23 MED ORDER — ZOLPIDEM TARTRATE 5 MG PO TABS: 5.0000 mg | ORAL_TABLET | Freq: Every evening | ORAL | Status: DC | PRN

## 2013-12-23 MED ORDER — LURASIDONE HCL 20 MG PO TABS: 20.0000 mg | ORAL_TABLET | Freq: Every day | ORAL | Status: DC

## 2013-12-23 MED ORDER — LURASIDONE HCL 40 MG PO TABS
20.0000 mg | ORAL_TABLET | Freq: Every day | ORAL | Status: DC
  Administered 2013-12-23 – 2013-12-25 (×2): 20 mg via ORAL
  Filled 2013-12-23 (×4): qty 1

## 2013-12-23 MED ORDER — ENSURE PO LIQD: 237.0000 mL | Freq: Two times a day (BID) | ORAL | Status: DC

## 2013-12-23 MED ORDER — RITONAVIR 100 MG PO TABS
100.0000 mg | ORAL_TABLET | Freq: Every day | ORAL | Status: DC
  Administered 2013-12-24 – 2013-12-26 (×3): 100 mg via ORAL
  Filled 2013-12-23 (×4): qty 1

## 2013-12-23 MED ORDER — METFORMIN HCL 500 MG PO TABS
500.0000 mg | ORAL_TABLET | Freq: Two times a day (BID) | ORAL | Status: DC
  Administered 2013-12-23: 500 mg via ORAL
  Filled 2013-12-23 (×3): qty 1

## 2013-12-23 MED ORDER — ACETAMINOPHEN 325 MG PO TABS
650.0000 mg | ORAL_TABLET | ORAL | Status: DC | PRN
  Filled 2013-12-23: qty 2

## 2013-12-23 MED ORDER — LORATADINE 10 MG PO TABS
10.0000 mg | ORAL_TABLET | Freq: Every day | ORAL | Status: DC
  Administered 2013-12-26: 10 mg via ORAL
  Filled 2013-12-23 (×4): qty 1

## 2013-12-23 MED ORDER — PRENATAL VITAMINS 0.8 MG PO TABS: 1.0000 | ORAL_TABLET | Freq: Every day | ORAL | Status: DC

## 2013-12-23 MED ORDER — SERTRALINE HCL 50 MG PO TABS
50.0000 mg | ORAL_TABLET | Freq: Every day | ORAL | Status: DC
  Administered 2013-12-24 – 2013-12-26 (×3): 50 mg via ORAL
  Filled 2013-12-23 (×4): qty 1

## 2013-12-23 MED ORDER — EMTRICITABINE-TENOFOVIR DF 200-300 MG PO TABS
1.0000 | ORAL_TABLET | Freq: Every day | ORAL | Status: DC
  Administered 2013-12-24 – 2013-12-26 (×3): 1 via ORAL
  Filled 2013-12-23 (×4): qty 1

## 2013-12-23 MED ORDER — OXYCODONE-ACETAMINOPHEN 5-325 MG PO TABS
1.0000 | ORAL_TABLET | Freq: Once | ORAL | Status: AC
  Administered 2013-12-23: 2 via ORAL
  Filled 2013-12-23: qty 2

## 2013-12-23 MED ORDER — PRENATAL MULTIVITAMIN CH
1.0000 | ORAL_TABLET | Freq: Every day | ORAL | Status: DC
  Administered 2013-12-24: 1 via ORAL
  Filled 2013-12-23: qty 1

## 2013-12-23 MED ORDER — DOCUSATE SODIUM 100 MG PO CAPS: 100.0000 mg | ORAL_CAPSULE | Freq: Every day | ORAL | Status: DC

## 2013-12-23 MED ORDER — ATAZANAVIR SULFATE 200 MG PO CAPS
400.0000 mg | ORAL_CAPSULE | Freq: Every day | ORAL | Status: DC
  Administered 2013-12-24 – 2013-12-26 (×3): 400 mg via ORAL
  Filled 2013-12-23 (×4): qty 2

## 2013-12-23 NOTE — Progress Notes (Signed)
CNM to come and evaluate patients rectal pressure

## 2013-12-23 NOTE — MAU Note (Signed)
Antenatal called for bed for pt. Will go to room 151

## 2013-12-23 NOTE — Progress Notes (Signed)
MD updated on 1 hour GTT results

## 2013-12-23 NOTE — H&P (Signed)
Tamara Briggs is a 28 y.o. 553P1011 female at 3023w0d by 6.6wk u/s, w/ known PPROM on 12/14/13, presenting ~1hr after leaving AMA after her FOB was banned from the hospital d/t belligerent attitude and threats toward staff. He returned to MAU w/ her and security was called and he left w/o incident.  Reports active fetal movement, contractions: regular, every 15 minutes per pt report- no change, vaginal bleeding: none, membranes: ruptured, clear fluid. [facility].  Most recent u/s 4/23 @ 27.5wks: vtx, afi 9.6cm, efw 1093g/2lb7oz/48%  Prenatal History/Complications: HIV VL <200, gc/ct w/ neg poc, abnormal pap, smoker, cocaine & thc use, protein-calorie malnutrition, pyleonephritis, schizophrenia, AFP elevated w/ normal u/s- declined amnio, pt w/ h/o NTD- had repair of lower lumbar NTD as infant, normocytic anemia  Past Medical History: Past Medical History  Diagnosis Date  . Crack cocaine use   . Anxiety   . Depression   . Schizophrenia   . Bipolar disorder   . 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 Otitis media of left ear 03/15/2013  . Infection     Past Surgical History: Past Surgical History  Procedure Laterality Date  . Colposcopy  2009    Obstetrical History: OB History   Grav Para Term Preterm Abortions TAB SAB Ect Mult Living   3 1 1  0 1 0 1 0 0 1      Social History: History   Social History  . Marital Status: Single    Spouse Name: N/A    Number of Children: N/A  . Years of Education: N/A   Social History Main Topics  . Smoking status: Current Every Day Smoker -- 0.50 packs/day for 3 years    Types: Cigarettes  . Smokeless tobacco: Never Used     Comment: cutting back  . Alcohol Use: No  . Drug Use: Yes    Special: Cocaine, Marijuana  . Sexual Activity: Yes    Partners: Male    Birth Control/ Protection: None   Other Topics Concern  . None   Social History Narrative  . None    Family History: Family History  Problem Relation Age of Onset  . Diabetes Mother      Allergies: No Known Allergies  Prescriptions prior to admission  Medication Sig Dispense Refill  . atazanavir (REYATAZ) 200 MG capsule Take 2 capsules (400 mg total) by mouth daily with breakfast.  60 capsule  5  . cetirizine-pseudoephedrine (ZYRTEC-D) 5-120 MG per tablet Take 1 tablet by mouth 2 (two) times daily.  30 tablet  6  . emtricitabine-tenofovir (TRUVADA) 200-300 MG per tablet Take 1 tablet by mouth daily.  30 tablet  11  . ENSURE (ENSURE) Take 237 mLs by mouth 2 (two) times daily between meals.  237 mL  11  . Lurasidone HCl (LATUDA) 20 MG TABS Take 20 mg by mouth at bedtime.       . Prenatal Multivit-Min-Fe-FA (PRENATAL VITAMINS) 0.8 MG tablet Take 1 tablet by mouth daily.  30 tablet  12  . ritonavir (NORVIR) 100 MG TABS tablet Take 1 tablet (100 mg total) by mouth daily.  30 tablet  11  . sertraline (ZOLOFT) 50 MG tablet Take 50 mg by mouth daily.         Review of Systems  Pertinent pos/neg as indicated in HPI    Blood pressure 140/79, pulse 116, temperature 98.7 F (37.1 C), temperature source Oral, resp. rate 18, last menstrual period 02/26/2013. General appearance: alert, cooperative and no distress Lungs:  clear to auscultation bilaterally Heart: regular rate and rhythm Abdomen: gravid, soft, non-tender Extremities: No edema DTR's 2+  Fetal monitoring: FHR: 150 bpm, variability: moderate,  Accelerations: Present,  decelerations:  Absent Uterine activity: None   Prenatal labs: ABO, Rh: --/--/O POS (04/23 0240) Antibody: NEG (04/23 0240) RPR: NON REAC (02/25 1450)  HBsAg: NEGATIVE (02/25 1450)  HIV: Reactive (04/21 0000)  GBS: Positive (04/23 0000)   1 hr Glucola: 208 (5/2) Genetic screening:  Elevated AFP w/ normal u/s, declined amnio Anatomy US: normal  Results for orders placed during the hospital encounter of 12/14/13 (from the past 24 hour(s))  GLUCOSE TOLERANCE, 1 HOUR   Collection Time    12/23/13  8:47 AM      Result Value Ref Range    Glucose, 1 Hour GTT 208 (*) 70 - 140 mg/dL  GLUCOSE, CAPILLARY   Collection Time    12/23/13 11:50 AM      Result Value Ref Range   Glucose-Capillary 154 (*) 70 - 99 mg/dL   Comment 1 Notify RN       Assessment:  7554w0d SIUP  G3P1011  PPROM @ 27.5wks, s/p BMZ, mag, and latency antbx  Readmit after leaving AMA ~1hr ago  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

## 2013-12-23 NOTE — Progress Notes (Signed)
Pt stating that she was leaving the hospital. Dr. Marice Potterove informed and in to talk to pt.  Pt informed of the risks of leaving against medical advice.  Pt verbalizes understanding and ambulating out.

## 2013-12-23 NOTE — Progress Notes (Signed)
SSE and SVE performed by Joellyn HaffKim Booker CNM.  FOB verbally abusive to staff and leaving room stating that "I need to leave before I do something".

## 2013-12-23 NOTE — Progress Notes (Signed)
MD in the OR and orders received to call CNM to come and check her cervix

## 2013-12-23 NOTE — Progress Notes (Signed)
Informed CNM that pt had been triaged. Received instructions to admit pt back to antenatal.

## 2013-12-23 NOTE — Progress Notes (Signed)
Patient ID: Tamara Briggs, female   DOB: 07/14/1986, 28 y.o.   MRN: [id number] FACULTY PRACTICE ANTEPARTUM(COMPREHENSIVE) NOTE  Tamara Briggs is a 28 y.o. G3P1011 at 3345w0d  who is admitted for PPROM.   Length of Stay:  9  Days  Subjective: Patient reports the fetal movement as active. Patient reports uterine contraction  activity as none. Patient reports  vaginal bleeding as none. Patient describes fluid per vagina as Clear.  Vitals:  Blood pressure 128/75, pulse 116, temperature 98.7 F (37.1 C), temperature source Oral, resp. rate 18, height 5\' 4"  (1.626 m), weight 109 lb 4.8 oz (49.578 kg), last menstrual period 02/26/2013, SpO2 99.00%. Physical Examination:  General appearance - alert, well appearing, and in no distress Abdomen - soft, NT Extremities - no edema, redness or tenderness in the calves or thighs, Homan's sign negative bilaterally  Fetal Monitoring:  Baseline: 150 bpm, Variability: Good {> 6 bpm), Accelerations: Reactive and Decelerations: Absent  Labs:  No results found for this or any previous visit (from the past 24 hour(s)).  Imaging Studies:      Medications:  Scheduled . atazanavir  400 mg Oral Q breakfast  . emtricitabine-tenofovir  1 tablet Oral Daily  . feeding supplement (ENSURE COMPLETE)  237 mL Oral BID BM  . lurasidone  20 mg Oral Daily  . prenatal multivitamin  1 tablet Oral Q1200  . ritonavir  100 mg Oral Q breakfast  . sertraline  50 mg Oral Daily   I have reviewed the patient's current medications.  ASSESSMENT: Patient Active Problem List   Diagnosis Date Noted  . Preterm premature rupture of membranes (PPROM) delivered, current hospitalization 12/14/2013  . Normocytic anemia 12/14/2013  . 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 Abnormal findings on antenatal screening 10/27/2013  . Maternal HIV positive complicating pregnancy in second trimester, antepartum 10/18/2013  . Schizophrenia 10/18/2013  . Pyelonephritis 07/21/2013  .  Protein-calorie malnutrition, severe 03/16/2013  . Tobacco abuse 03/15/2013  . h/o crack Cocaine abuse 03/15/2013  . h/o oral Thrush 03/15/2013  . HIV (human immunodeficiency virus infection) 07/27/2011  . Chlamydia and Gonorrhea 07/27/2011  . H/O abnormal cervical Papanicolaou smear 07/26/2008    PLAN: 28 yo female with PPROM and schizophrenia and HIV  1-continue to monitor for signs of chorioamnionitis and labor 2-Growth scan due week of May 18 3-Continue HIV meds and consult ID as necessary 4- 1hr glucola today  Howard Bunte 12/23/2013,6:57 AM

## 2013-12-23 NOTE — Progress Notes (Signed)
Ms. Tamara Briggs's FOB was told by security that he had to leave the hospital and now Ms. Tamara Briggs says that she will not stay here. I have explained that she is putting her baby in jeopardy, possibly resulting in fetal death. She states that she is going to another hospital. She signed AMA forms.
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
## 2013-12-23 NOTE — Progress Notes (Signed)
Nutrition Follow-up  Noted diet order change to carbohydrate modified gestational  The Ensure Complete ordered BID provides 51 g carbohydrate per 8 oz serving. May wish to substitute the Ensure for a snack, as it is providing they whole CHO usually provided by 1 meal of the CHO mod gestational diet. Meal limits for GDM diet 45-50 g, and for snacks 30-35 g.  Will follow-up with pt Monday, to make her aware of diet parameters.  Elisabeth CaraKatherine Jewelle Whitner M.Odis LusterEd. R.D. LDN Neonatal Nutrition Support Specialist Pager [phone]

## 2013-12-23 NOTE — OB Triage Note (Signed)
Pt leaving AMA after being informed of the risks by Dr. Marice Potterove

## 2013-12-23 NOTE — Progress Notes (Signed)
Pt walking in the hallway and encouraged to return to her room stating that she needs to be on bedrest especially since she has been in pain.  Pt returning to room but refusing to return to the monitor. PT tearful but wanting to be left alone for a little bit

## 2013-12-23 NOTE — Progress Notes (Signed)
Patient ID: Tamara Briggs, female   DOB: 05/17/1986, 28 y.o.   MRN: [id number]  Called by RN d/t pt reports of painful uc's. States they are painful enough that she wants pain meds.   S: Pt reports painful uc's x ~1hr, app q 15mins, +FM, +leakage of clear fluid, no vb  O: BP 129/81  Pulse 117  Temp(Src) 98.5 F (36.9 C) (Oral)  Resp 18  Ht 5\' 4"  (1.626 m)  Wt 49.578 kg (109 lb 4.8 oz)  BMI 18.75 kg/m2  SpO2 99%  LMP 02/26/2013       Abd: nontender      SSE: pooling of clear fluid, no malodor, unable to adequately visualize cervix      SVE: unchanged from admission, small amount bloody show       FHR: 155, mod variability, +accels, no decels=Cat I      UCs: difficult to ascertain d/t artifact, looks like ui  A: 8667w0d SIUP     PPROM 4/23, s/p mag & bmz     Cat I FHR     Painful uc's perceived by pt, no cervical change     No s/s infection  P: Continue to observe for labor/infection      Fentanyl 50mcg IV x 1      Dr. Marice Potterove notified  Of note, female in room became belligerent while I was in room doing exam, stating 'What in the world are ya'll doing to her, you're up in there like you're digging for gold, I'm gonna get her transferred out of here'. I explained to him that she was complaining of painful contractions, so she needed to be assessed, and that if she wanted to be transferred that was fine. He continued to be disruptive. Pt had no complaints.   Cheral MarkerKimberly R. Rayelynn Loyal, CNM, [facility]NP[facility]BC 12/23/2013 4:55 PM

## 2013-12-24 ENCOUNTER — Inpatient Hospital Stay (HOSPITAL_COMMUNITY): Payer: Medicaid Other | Admitting: Anesthesiology

## 2013-12-24 ENCOUNTER — Encounter (HOSPITAL_COMMUNITY)
Admission: AD | Disposition: A | Discharge status: Home / Self Care | Payer: Self-pay | Source: Ambulatory Visit | Attending: Obstetrics & Gynecology

## 2013-12-24 ENCOUNTER — Encounter (HOSPITAL_COMMUNITY): Payer: Self-pay | Admitting: *Deleted

## 2013-12-24 ENCOUNTER — Encounter (HOSPITAL_COMMUNITY): Payer: Medicaid Other | Admitting: Anesthesiology

## 2013-12-24 DIAGNOSIS — O98519 Other viral diseases complicating pregnancy, unspecified trimester: Secondary | ICD-10-CM

## 2013-12-24 DIAGNOSIS — O411090 Infection of amniotic sac and membranes, unspecified, unspecified trimester, not applicable or unspecified: Secondary | ICD-10-CM

## 2013-12-24 LAB — CBC
HCT: 32.5 % — ABNORMAL LOW (ref 36.0–46.0)
HEMOGLOBIN: 10.9 g/dL — AB (ref 12.0–15.0)
MCH: 28.3 pg (ref 26.0–34.0)
MCHC: 33.5 g/dL (ref 30.0–36.0)
MCV: 84.4 fL (ref 78.0–100.0)
Platelets: 137 10*3/uL — ABNORMAL LOW (ref 150–400)
RBC: 3.85 MIL/uL — AB (ref 3.87–5.11)
RDW: 13.6 % (ref 11.5–15.5)
WBC: 11.2 10*3/uL — ABNORMAL HIGH (ref 4.0–10.5)

## 2013-12-24 LAB — GLUCOSE, CAPILLARY
GLUCOSE-CAPILLARY: 106 mg/dL — AB (ref 70–99)
Glucose-Capillary: 89 mg/dL (ref 70–99)

## 2013-12-24 SURGERY — Surgical Case
Anesthesia: Spinal | Site: Abdomen

## 2013-12-24 MED ORDER — OXYTOCIN 10 UNIT/ML IJ SOLN
40.0000 [IU] | INTRAVENOUS | Status: DC | PRN
  Administered 2013-12-24: 40 [IU] via INTRAVENOUS

## 2013-12-24 MED ORDER — SCOPOLAMINE 1 MG/3DAYS TD PT72
MEDICATED_PATCH | TRANSDERMAL | Status: AC
  Filled 2013-12-24: qty 1

## 2013-12-24 MED ORDER — FENTANYL CITRATE 0.05 MG/ML IJ SOLN
INTRAMUSCULAR | Status: DC | PRN
  Administered 2013-12-24: 87.5 ug via INTRAVENOUS
  Administered 2013-12-24: 12.5 ug via INTRATHECAL

## 2013-12-24 MED ORDER — PHENYLEPHRINE 40 MCG/ML (10ML) SYRINGE FOR IV PUSH (FOR BLOOD PRESSURE SUPPORT)
PREFILLED_SYRINGE | INTRAVENOUS | Status: AC
  Filled 2013-12-24: qty 10

## 2013-12-24 MED ORDER — ONDANSETRON HCL 4 MG/2ML IJ SOLN
INTRAMUSCULAR | Status: AC
  Filled 2013-12-24: qty 2

## 2013-12-24 MED ORDER — MORPHINE SULFATE 0.5 MG/ML IJ SOLN
INTRAMUSCULAR | Status: AC
  Filled 2013-12-24: qty 10

## 2013-12-24 MED ORDER — PRENATAL MULTIVITAMIN CH
1.0000 | ORAL_TABLET | Freq: Every day | ORAL | Status: DC
  Administered 2013-12-25 – 2013-12-26 (×2): 1 via ORAL
  Filled 2013-12-24 (×2): qty 1

## 2013-12-24 MED ORDER — METFORMIN HCL 500 MG PO TABS
500.0000 mg | ORAL_TABLET | Freq: Two times a day (BID) | ORAL | Status: DC
  Administered 2013-12-24: 500 mg via ORAL
  Filled 2013-12-24 (×3): qty 1

## 2013-12-24 MED ORDER — SIMETHICONE 80 MG PO CHEW
80.0000 mg | CHEWABLE_TABLET | ORAL | Status: DC
  Administered 2013-12-24: 80 mg via ORAL
  Filled 2013-12-24: qty 1

## 2013-12-24 MED ORDER — TETANUS-DIPHTH-ACELL PERTUSSIS 5-2.5-18.5 LF-MCG/0.5 IM SUSP: 0.5000 mL | Freq: Once | INTRAMUSCULAR | Status: DC

## 2013-12-24 MED ORDER — DIBUCAINE 1 % RE OINT: 1.0000 "application " | TOPICAL_OINTMENT | RECTAL | Status: DC | PRN

## 2013-12-24 MED ORDER — CITRIC ACID-SODIUM CITRATE 334-500 MG/5ML PO SOLN
ORAL | Status: AC
  Administered 2013-12-24: 18:00:00
  Filled 2013-12-24: qty 15

## 2013-12-24 MED ORDER — OXYTOCIN 40 UNITS IN LACTATED RINGERS INFUSION - SIMPLE MED: 62.5000 mL/h | INTRAVENOUS | Status: AC

## 2013-12-24 MED ORDER — GENTAMICIN SULFATE 40 MG/ML IJ SOLN
140.0000 mg | Freq: Three times a day (TID) | INTRAVENOUS | Status: DC
  Administered 2013-12-24: 140 mg via INTRAVENOUS
  Filled 2013-12-24 (×4): qty 3.5

## 2013-12-24 MED ORDER — METOCLOPRAMIDE HCL 5 MG/ML IJ SOLN: 10.0000 mg | Freq: Three times a day (TID) | INTRAMUSCULAR | Status: DC | PRN

## 2013-12-24 MED ORDER — MAGNESIUM HYDROXIDE 400 MG/5ML PO SUSP: 30.0000 mL | ORAL | Status: DC | PRN

## 2013-12-24 MED ORDER — SCOPOLAMINE 1 MG/3DAYS TD PT72
1.0000 | MEDICATED_PATCH | Freq: Once | TRANSDERMAL | Status: DC
  Administered 2013-12-24: 1.5 mg via TRANSDERMAL

## 2013-12-24 MED ORDER — MEASLES, MUMPS & RUBELLA VAC ~~LOC~~ INJ
0.5000 mL | INJECTION | Freq: Once | SUBCUTANEOUS | Status: AC
  Administered 2013-12-26: 0.5 mL via SUBCUTANEOUS
  Filled 2013-12-24 (×3): qty 0.5

## 2013-12-24 MED ORDER — MENTHOL 3 MG MT LOZG: 1.0000 | LOZENGE | OROMUCOSAL | Status: DC | PRN

## 2013-12-24 MED ORDER — ONDANSETRON HCL 4 MG PO TABS: 4.0000 mg | ORAL_TABLET | ORAL | Status: DC | PRN

## 2013-12-24 MED ORDER — LANOLIN HYDROUS EX OINT: 1.0000 "application " | TOPICAL_OINTMENT | CUTANEOUS | Status: DC | PRN

## 2013-12-24 MED ORDER — DEXTROSE 5 % IV SOLN
Freq: Three times a day (TID) | INTRAVENOUS | Status: DC
  Administered 2013-12-25 – 2013-12-26 (×4): via INTRAVENOUS
  Filled 2013-12-24 (×7): qty 3.5

## 2013-12-24 MED ORDER — PHENYLEPHRINE HCL 10 MG/ML IJ SOLN
INTRAMUSCULAR | Status: DC | PRN
  Administered 2013-12-24: 40 ug via INTRAVENOUS
  Administered 2013-12-24 (×3): 80 ug via INTRAVENOUS
  Administered 2013-12-24: 40 ug via INTRAVENOUS
  Administered 2013-12-24: 80 ug via INTRAVENOUS

## 2013-12-24 MED ORDER — NALBUPHINE HCL 10 MG/ML IJ SOLN: 5.0000 mg | INTRAMUSCULAR | Status: DC | PRN

## 2013-12-24 MED ORDER — MORPHINE SULFATE (PF) 0.5 MG/ML IJ SOLN
INTRAMUSCULAR | Status: DC | PRN
  Administered 2013-12-24: .1 mg via INTRATHECAL

## 2013-12-24 MED ORDER — ONDANSETRON HCL 4 MG/2ML IJ SOLN: 4.0000 mg | Freq: Four times a day (QID) | INTRAMUSCULAR | Status: DC | PRN

## 2013-12-24 MED ORDER — KETOROLAC TROMETHAMINE 30 MG/ML IJ SOLN: 30.0000 mg | Freq: Four times a day (QID) | INTRAMUSCULAR | Status: AC | PRN

## 2013-12-24 MED ORDER — FENTANYL CITRATE 0.05 MG/ML IJ SOLN
INTRAMUSCULAR | Status: AC
  Filled 2013-12-24: qty 2

## 2013-12-24 MED ORDER — GLYBURIDE 2.5 MG PO TABS
2.5000 mg | ORAL_TABLET | Freq: Two times a day (BID) | ORAL | Status: DC
  Administered 2013-12-24: 2.5 mg via ORAL
  Filled 2013-12-24 (×3): qty 1

## 2013-12-24 MED ORDER — OXYTOCIN 10 UNIT/ML IJ SOLN
INTRAMUSCULAR | Status: AC
  Filled 2013-12-24: qty 1

## 2013-12-24 MED ORDER — ZIDOVUDINE 10 MG/ML IV SOLN
2.0000 mg/kg | Freq: Once | INTRAVENOUS | Status: DC
  Filled 2013-12-24: qty 10.3

## 2013-12-24 MED ORDER — 0.9 % SODIUM CHLORIDE (POUR BTL) OPTIME
TOPICAL | Status: DC | PRN
  Administered 2013-12-24: 1000 mL

## 2013-12-24 MED ORDER — OXYCODONE-ACETAMINOPHEN 5-325 MG PO TABS
1.0000 | ORAL_TABLET | ORAL | Status: DC | PRN
  Administered 2013-12-25 (×2): 1 via ORAL
  Filled 2013-12-24: qty 1
  Filled 2013-12-24: qty 2
  Filled 2013-12-24: qty 1

## 2013-12-24 MED ORDER — NALOXONE HCL 1 MG/ML IJ SOLN
1.0000 ug/kg/h | INTRAVENOUS | Status: DC | PRN
  Filled 2013-12-24: qty 2

## 2013-12-24 MED ORDER — HYDROMORPHONE HCL PF 1 MG/ML IJ SOLN: 0.2500 mg | INTRAMUSCULAR | Status: DC | PRN

## 2013-12-24 MED ORDER — IBUPROFEN 600 MG PO TABS
600.0000 mg | ORAL_TABLET | Freq: Four times a day (QID) | ORAL | Status: DC
  Administered 2013-12-25 – 2013-12-26 (×6): 600 mg via ORAL
  Filled 2013-12-24 (×6): qty 1

## 2013-12-24 MED ORDER — SIMETHICONE 80 MG PO CHEW: 80.0000 mg | CHEWABLE_TABLET | ORAL | Status: DC | PRN

## 2013-12-24 MED ORDER — DIPHENHYDRAMINE HCL 50 MG/ML IJ SOLN: 25.0000 mg | INTRAMUSCULAR | Status: DC | PRN

## 2013-12-24 MED ORDER — NALOXONE HCL 0.4 MG/ML IJ SOLN: 0.4000 mg | INTRAMUSCULAR | Status: DC | PRN

## 2013-12-24 MED ORDER — DIPHENHYDRAMINE HCL 50 MG/ML IJ SOLN
INTRAMUSCULAR | Status: AC
  Administered 2013-12-24: 12.5 mg via INTRAVENOUS
  Filled 2013-12-24: qty 1

## 2013-12-24 MED ORDER — CLINDAMYCIN PHOSPHATE 600 MG/50ML IV SOLN: 600.0000 mg | Freq: Three times a day (TID) | INTRAVENOUS | Status: DC

## 2013-12-24 MED ORDER — DIPHENHYDRAMINE HCL 25 MG PO CAPS
25.0000 mg | ORAL_CAPSULE | ORAL | Status: DC | PRN
  Administered 2013-12-25: 25 mg via ORAL
  Filled 2013-12-24 (×3): qty 1

## 2013-12-24 MED ORDER — ONDANSETRON HCL 4 MG/2ML IJ SOLN
INTRAMUSCULAR | Status: DC | PRN
  Administered 2013-12-24: 4 mg via INTRAVENOUS

## 2013-12-24 MED ORDER — CEFAZOLIN SODIUM-DEXTROSE 2-3 GM-% IV SOLR
INTRAVENOUS | Status: AC
  Filled 2013-12-24: qty 50

## 2013-12-24 MED ORDER — ACETAMINOPHEN 325 MG PO TABS
650.0000 mg | ORAL_TABLET | Freq: Four times a day (QID) | ORAL | Status: DC | PRN
  Administered 2013-12-24: 650 mg via ORAL

## 2013-12-24 MED ORDER — CEFAZOLIN SODIUM-DEXTROSE 2-3 GM-% IV SOLR
INTRAVENOUS | Status: DC | PRN
  Administered 2013-12-24: 2 g via INTRAVENOUS

## 2013-12-24 MED ORDER — DIPHENHYDRAMINE HCL 50 MG/ML IJ SOLN
12.5000 mg | INTRAMUSCULAR | Status: DC | PRN
  Administered 2013-12-24: 12.5 mg via INTRAVENOUS

## 2013-12-24 MED ORDER — LACTATED RINGERS IV SOLN
INTRAVENOUS | Status: DC
  Administered 2013-12-24: via INTRAVENOUS

## 2013-12-24 MED ORDER — LACTATED RINGERS IV SOLN
INTRAVENOUS | Status: DC | PRN
  Administered 2013-12-24 (×2): via INTRAVENOUS

## 2013-12-24 MED ORDER — SODIUM CHLORIDE 0.9 % IV SOLN
2.0000 g | Freq: Four times a day (QID) | INTRAVENOUS | Status: DC
  Administered 2013-12-24 – 2013-12-26 (×7): 2 g via INTRAVENOUS
  Filled 2013-12-24 (×8): qty 2000

## 2013-12-24 MED ORDER — DIPHENHYDRAMINE HCL 25 MG PO CAPS: 25.0000 mg | ORAL_CAPSULE | Freq: Four times a day (QID) | ORAL | Status: DC | PRN

## 2013-12-24 MED ORDER — ONDANSETRON HCL 4 MG/2ML IJ SOLN: 4.0000 mg | Freq: Three times a day (TID) | INTRAMUSCULAR | Status: DC | PRN

## 2013-12-24 MED ORDER — MEPERIDINE HCL 25 MG/ML IJ SOLN: 6.2500 mg | INTRAMUSCULAR | Status: DC | PRN

## 2013-12-24 MED ORDER — LACTATED RINGERS IV SOLN: INTRAVENOUS | Status: DC

## 2013-12-24 MED ORDER — SIMETHICONE 80 MG PO CHEW
80.0000 mg | CHEWABLE_TABLET | Freq: Three times a day (TID) | ORAL | Status: DC
  Administered 2013-12-25 – 2013-12-26 (×6): 80 mg via ORAL
  Filled 2013-12-24 (×6): qty 1

## 2013-12-24 MED ORDER — KETOROLAC TROMETHAMINE 60 MG/2ML IM SOLN
60.0000 mg | Freq: Once | INTRAMUSCULAR | Status: AC | PRN
  Filled 2013-12-24: qty 2

## 2013-12-24 MED ORDER — ONDANSETRON HCL 4 MG/2ML IJ SOLN: 4.0000 mg | INTRAMUSCULAR | Status: DC | PRN

## 2013-12-24 MED ORDER — SODIUM CHLORIDE 0.9 % IJ SOLN: 3.0000 mL | INTRAMUSCULAR | Status: DC | PRN

## 2013-12-24 MED ORDER — WITCH HAZEL-GLYCERIN EX PADS: 1.0000 "application " | MEDICATED_PAD | CUTANEOUS | Status: DC | PRN

## 2013-12-24 MED ORDER — SENNOSIDES-DOCUSATE SODIUM 8.6-50 MG PO TABS
2.0000 | ORAL_TABLET | ORAL | Status: DC
  Administered 2013-12-24 – 2013-12-26 (×2): 2 via ORAL
  Filled 2013-12-24 (×2): qty 2

## 2013-12-24 MED ORDER — TERBUTALINE SULFATE 1 MG/ML IJ SOLN
INTRAMUSCULAR | Status: AC
  Filled 2013-12-24: qty 1

## 2013-12-24 MED ORDER — CITRIC ACID-SODIUM CITRATE 334-500 MG/5ML PO SOLN: 30.0000 mL | Freq: Once | ORAL | Status: DC

## 2013-12-24 MED ORDER — ZIDOVUDINE 10 MG/ML IV SOLN: 1.0000 mg/kg/h | INTRAVENOUS | Status: DC

## 2013-12-24 MED ORDER — BUPIVACAINE IN DEXTROSE 0.75-8.25 % IT SOLN
INTRATHECAL | Status: DC | PRN
  Administered 2013-12-24: 1.5 mL via INTRATHECAL

## 2013-12-24 MED ORDER — ZOLPIDEM TARTRATE 5 MG PO TABS
5.0000 mg | ORAL_TABLET | Freq: Every evening | ORAL | Status: DC | PRN
  Filled 2013-12-24: qty 1

## 2013-12-24 SURGICAL SUPPLY — 35 items
CLAMP CORD UMBIL (MISCELLANEOUS) IMPLANT
CLOTH BEACON ORANGE TIMEOUT ST (SAFETY) ×3 IMPLANT
DRAIN JACKSON PRT FLT 7MM (DRAIN) IMPLANT
DRAPE LG THREE QUARTER DISP (DRAPES) IMPLANT
DRSG OPSITE POSTOP 4X10 (GAUZE/BANDAGES/DRESSINGS) ×3 IMPLANT
DURAPREP 26ML APPLICATOR (WOUND CARE) ×3 IMPLANT
ELECT REM PT RETURN 9FT ADLT (ELECTROSURGICAL) ×3
ELECTRODE REM PT RTRN 9FT ADLT (ELECTROSURGICAL) ×1 IMPLANT
EVACUATOR SILICONE 100CC (DRAIN) IMPLANT
EXTRACTOR VACUUM M CUP 4 TUBE (SUCTIONS) IMPLANT
EXTRACTOR VACUUM M CUP 4' TUBE (SUCTIONS)
GLOVE BIO SURGEON STRL SZ7 (GLOVE) ×3 IMPLANT
GLOVE BIOGEL PI IND STRL 7.0 (GLOVE) ×1 IMPLANT
GLOVE BIOGEL PI INDICATOR 7.0 (GLOVE) ×2
GOWN STRL REUS W/TWL LRG LVL3 (GOWN DISPOSABLE) ×6 IMPLANT
KIT ABG SYR 3ML LUER SLIP (SYRINGE) IMPLANT
NDL HYPO 25X5/8 SAFETYGLIDE (NEEDLE) ×1 IMPLANT
NEEDLE HYPO 25X5/8 SAFETYGLIDE (NEEDLE) ×3 IMPLANT
NS IRRIG 1000ML POUR BTL (IV SOLUTION) ×3 IMPLANT
PACK C SECTION WH (CUSTOM PROCEDURE TRAY) ×3 IMPLANT
PAD ABD 7.5X8 STRL (GAUZE/BANDAGES/DRESSINGS) ×2 IMPLANT
PAD OB MATERNITY 4.3X12.25 (PERSONAL CARE ITEMS) ×3 IMPLANT
REMOVER STAPLE SKIN (DISPOSABLE) ×2 IMPLANT
RETAINER VISCERAL (MISCELLANEOUS) ×2 IMPLANT
RTRCTR C-SECT PINK 25CM LRG (MISCELLANEOUS) ×3 IMPLANT
SPONGE GAUZE 4X4 12PLY (GAUZE/BANDAGES/DRESSINGS) ×2 IMPLANT
SUT VIC AB 0 CTX 36 (SUTURE) ×15
SUT VIC AB 0 CTX36XBRD ANBCTRL (SUTURE) ×5 IMPLANT
SUT VIC AB 4-0 KS 27 (SUTURE) ×3 IMPLANT
TAPE CLOTH SURG 4X10 WHT LF (GAUZE/BANDAGES/DRESSINGS) ×2 IMPLANT
TOWEL OR 17X24 6PK STRL BLUE (TOWEL DISPOSABLE) ×3 IMPLANT
TRAY FOLEY CATH 14FR (SET/KITS/TRAYS/PACK) ×3 IMPLANT
TUBING SUCTION BULK 100 FT (MISCELLANEOUS) ×2 IMPLANT
WATER STERILE IRR 1000ML POUR (IV SOLUTION) ×3 IMPLANT
YANKAUER SUCT BULB TIP NO VENT (SUCTIONS) ×2 IMPLANT

## 2013-12-24 NOTE — Anesthesia Postprocedure Evaluation (Signed)
  Anesthesia Post-op Note  Patient: Tamara Briggs  Procedure(s) Performed: Procedure(s) (LRB): CESAREAN SECTION (N/A)  Patient Location: PACU  Anesthesia Type: Spinal  Level of Consciousness: awake and alert   Airway and Oxygen Therapy: Patient Spontanous Breathing  Post-op Pain: mild  Post-op Assessment: Post-op Vital signs reviewed, Patient's Cardiovascular Status Stable, Respiratory Function Stable, Patent Airway and No signs of Nausea or vomiting  Last Vitals:  Filed Vitals:   12/24/13 1945  BP: 113/62  Pulse: 73  Temp:   Resp: 19    Post-op Vital Signs: stable   Complications: No apparent anesthesia complications

## 2013-12-24 NOTE — Progress Notes (Signed)
Patient ID: Tamara Briggs, female   DOB: 09/05/1985, 28 y.o.   MRN: [id number]  Called to evaluate FHT.  Baseline 180 with decreased variability.  2 variables to 90.  Temp oral is 99.4 but feels warmer.  (rectal was 99).  Cervix dilated 2/70 with vertex at -1.  Clinical picture consistent wtih chorioamnionitis.  Will proceed with delivery.  Tylenol to be given.  Gent/Amp for chorio.  Will induce.  Amnioinfusion might be beneficial.

## 2013-12-24 NOTE — Consult Note (Signed)
Neonatology Note:  Attendance at C-section:  I was asked by Drs. Leggett and Beck to attend this Stat C/S at 29 1/[redacted] weeks GA following onset of maternal fever and fetal bradycardia. The mother is a G3P1A1 O pos, GBS pos, HIV positive schizophrenic smoker with a history of crack cocaine use. ROM 10 days prior to delivery, fluid clear. She received Betamethasone on 4/23-24, and had been on antiviral medications during pregnancy. This afternoon, she became febrile and there were FHR decelerations. The decision was made to perform an emergent C/S when the FHR dropped to the 30s. At delivery, infant had some tone and respiratory effort. We quickly dried the baby and clamped the cord, then placed the baby in the portawarmer bag. The HR was > 100 at birth, but dropped to below 100 by 1 minute due to insufficient respiratory effort. The baby was given PPV for about 1 minute, and we placed a pulse oximeter on her. At 3 minutes of life, I performed tracheal intubation atraumatically on the first attempt (tube was a little small for the size of the airway) with a 2.5 mm ETT to a depth of 7.5 cm at the lip. The CO2 detector turned yellow immediately and good breath sounds could be heard bilaterally, although there was a large air leak around the tube. The HR rose quickly and the baby's color was pink. The O2 saturations rose to 80%. We secured the tube, then gave 3.3 ml Infasurf via the ETT at 8 minutes of life. We noted a small amount of nasal regurgitation of the surfactant, which stopped if we applied some external pressure over the trachea. Ap 5/8. Lungs with squeaky breath sounds, equal bilaterally. The baby was seen briefly by her mother and grandmother in the OR, then was transported to the NICU, being bagged via the ETT en route, on 80% FIO2. O2 saturations were about 90%.  Angell Pincock C. Kerron Sedano, MD  

## 2013-12-24 NOTE — Progress Notes (Signed)
ANTIBIOTIC CONSULT NOTE - INITIAL  Pharmacy Consult for Gentamicin Indication: Chorioamnionitis   No Known Allergies  Patient Measurements: Height: 5\' 4"  (162.6 cm) Weight: 114 lb (51.71 kg) IBW/kg (Calculated) : 54.7 Adjusted Body Weight: 51.8  Vital Signs: Temp: 97.7 F (36.5 C) (05/03 1900) Temp src: Oral (05/03 1654) BP: 93/58 mmHg (05/03 1900) Pulse Rate: 80 (05/03 1900)  Labs:  Recent Labs  12/24/13 1705  WBC 11.2*  HGB 10.9*  PLT 137*   No results found for this basename: GENTTROUGH, GENTPEAK, GENTRANDOM,  in the last 72 hours   Microbiology: Recent Results (from the past 720 hour(s))  OB RESULTS CONSOLE GBS     Status: None   Collection Time    12/14/13 12:00 AM      Result Value Ref Range Status   GBS Positive   Final  CULTURE, BETA STREP (GROUP B ONLY)     Status: None   Collection Time    12/14/13  2:24 AM      Result Value Ref Range Status   Specimen Description VAGINAL/RECTAL   Final   Special Requests NONE   Final   Culture     Final   Value: GROUP B STREP(S.AGALACTIAE)ISOLATED     Note: TESTING AGAINST S. AGALACTIAE NOT ROUTINELY PERFORMED DUE TO PREDICTABILITY OF AMP/PEN/VAN SUSCEPTIBILITY.     Performed at [facility]   Report Status 12/18/2013 FINAL   Final  GC/CHLAMYDIA PROBE AMP     Status: None   Collection Time    12/14/13  2:24 AM      Result Value Ref Range Status   CT Probe RNA NEGATIVE  NEGATIVE Final   GC Probe RNA NEGATIVE  NEGATIVE Final   Comment: (NOTE)                                                                                               **Normal Reference Range: Negative**          Assay performed using the Gen-Probe APTIMA COMBO2 (R) Assay.     Acceptable specimen types for this assay include APTIMA Swabs (Unisex,     endocervical, urethral, or vaginal), first void urine, and ThinPrep     liquid based cytology samples.     Performed at [facility]    Medications:  Ampicillin 2g IV  q6h  Assessment: 28 y.o. female [id number] at 4772w1d - s/p emergent c-section secondary to chorioamnionitis. Estimated Ke = 0.34, Vd = 20.7 L  Goal of Therapy:  Gentamicin peak 6-8 mg/L and Trough < 1 mg/L  Plan:   Gentamicin 140 mg IV every 8 hrs  Check Scr with next labs if gentamicin continued. Will check gentamicin levels if continued > 72hr or clinically indicated.  Gildardo Crankerlison Lydia Emmalise Huard 12/24/2013,7:22 PM

## 2013-12-24 NOTE — Anesthesia Preprocedure Evaluation (Signed)
Anesthesia Evaluation  Patient identified by MRN, date of birth, ID band Patient awake and Patient confused    Reviewed: Allergy & Precautions, Patient's Chart, lab work & pertinent test results, Unable to perform ROS - Chart review only  Airway Mallampati: II TM Distance: >3 FB Neck ROM: Full    Dental no notable dental hx. (+) Poor Dentition   Pulmonary neg pulmonary ROS, Current Smoker,  breath sounds clear to auscultation  Pulmonary exam normal       Cardiovascular negative cardio ROS  Rhythm:Regular Rate:Normal     Neuro/Psych PSYCHIATRIC DISORDERS Schizophrenia negative neurological ROS  negative psych ROS   GI/Hepatic negative GI ROS, Neg liver ROS, (+)     substance abuse  cocaine use and IV drug use,   Endo/Other  negative endocrine ROS  Renal/GU negative Renal ROS  negative genitourinary   Musculoskeletal negative musculoskeletal ROS (+)   Abdominal   Peds negative pediatric ROS (+)  Hematology negative hematology ROS (+) HIV,   Anesthesia Other Findings   Reproductive/Obstetrics negative OB ROS                           Anesthesia Physical Anesthesia Plan  ASA: III and emergent  Anesthesia Plan: Spinal   Post-op Pain Management:    Induction:   Airway Management Planned: Natural Airway  Additional Equipment:   Intra-op Plan:   Post-operative Plan:   Informed Consent: I have reviewed the patients History and Physical, chart, labs and discussed the procedure including the risks, benefits and alternatives for the proposed anesthesia with the patient or authorized representative who has indicated his/her understanding and acceptance.   Dental advisory given  Plan Discussed with:   Anesthesia Plan Comments:         Anesthesia Quick Evaluation

## 2013-12-24 NOTE — Anesthesia Procedure Notes (Signed)
Spinal  Patient location during procedure: OR Staffing Anesthesiologist: Montez Hageman Performed by: anesthesiologist  Preanesthetic Checklist Completed: patient identified, site marked, surgical consent, pre-op evaluation, timeout performed, IV checked, risks and benefits discussed and monitors and equipment checked Spinal Block Patient position: sitting Prep: ChloraPrep Patient monitoring: heart rate, continuous pulse ox and blood pressure Approach: right paramedian Location: L3-4 Injection technique: single-shot Needle Needle type: Sprotte  Needle gauge: 24 G Needle length: 9 cm Additional Notes Expiration date of kit checked and confirmed. Patient tolerated procedure well, without complications.

## 2013-12-24 NOTE — Op Note (Signed)
Attestation of Attending Supervision of Fellow: Evaluation and management procedures were performed by the Fellow under my supervision and collaboration. I have reviewed the Fellow's note and chart, and I agree with the management and plan. I was present for the entire procedure. Tamara DukesKelly H Juliza Machnik

## 2013-12-24 NOTE — Progress Notes (Signed)
Pt off the monitor after reassurring FHR  

## 2013-12-24 NOTE — Progress Notes (Signed)
Patient ID: Tamara Briggs, female   DOB: 12/15/1985, 28 y.o.   MRN: [id number] Tamara Briggs is a 28 y.o. G3P1011 at 5821w1d who is admitted for PPROM.  Length of Stay: 10 Days  Subjective:  Patient reports the fetal movement as active.  Patient reports uterine contraction activity as none.  Patient reports vaginal bleeding as none.  Patient describes fluid per vagina as Clear.  Vitals: Blood pressure 128/75, pulse 116, temperature 98.7 F (37.1 C), temperature source Oral, resp. rate 18, height 5\' 4"  (1.626 m), weight 109 lb 4.8 oz (49.578 kg), last menstrual period 02/26/2013, SpO2 99.00%.  Physical Examination:  General appearance - alert, well appearing, and in no distress  Abdomen - soft, NT  Extremities - no edema, redness or tenderness in the calves or thighs, Homan's sign negative bilaterally  Fetal Monitoring: Baseline: 150 bpm, Variability: Good {> 6 bpm), Accelerations: Reactive and Decelerations: Absent  Labs:  No results found for this or any previous visit (from the past 24 hour(s)).  Imaging Studies:  Medications: Scheduled  .  atazanavir  400 mg  Oral  Q breakfast   .  emtricitabine-tenofovir  1 tablet  Oral  Daily   .  feeding supplement (ENSURE COMPLETE)  237 mL  Oral  BID BM   .  lurasidone  20 mg  Oral  Daily   .  prenatal multivitamin  1 tablet  Oral  Q1200   .  ritonavir  100 mg  Oral  Q breakfast   .  sertraline  50 mg  Oral  Daily   I have reviewed the patient's current medications.  ASSESSMENT:  Patient Active Problem List    Diagnosis  Date Noted   .  Preterm premature rupture of membranes (PPROM) delivered, current hospitalization  12/14/2013   .  Normocytic anemia  12/14/2013   .  History of neural tube defect  11/16/2013   .  Abnormal findings on antenatal screening  10/27/2013   .  Maternal HIV positive complicating pregnancy in second trimester, antepartum  10/18/2013   .  Schizophrenia  10/18/2013   .  Pyelonephritis  07/21/2013   .  Protein-calorie  malnutrition, severe  03/16/2013   .  Tobacco abuse  03/15/2013   .  h/o crack Cocaine abuse  03/15/2013   .  h/o oral Thrush  03/15/2013   .  HIV (human immunodeficiency virus infection)  07/27/2011   .  Chlamydia and Gonorrhea  07/27/2011   .  H/O abnormal cervical Papanicolaou smear  07/26/2008   PLAN:  28 yo female with PPROM and schizophrenia and HIV  1-continue to monitor for signs of chorioamnionitis and labor  2-Growth scan due week of May 18  3-Continue HIV meds and consult ID as necessary 4. GDM- continue following sugars with metformin 500 mg BID. Teaching done.

## 2013-12-24 NOTE — Op Note (Signed)
Tamara Briggs PROCEDURE DATE: 12/23/2013 - 12/24/2013  PREOPERATIVE DIAGNOSES: Intrauterine pregnancy at  2369w1d weeks gestation; PROM, repetetive prolonged decelerations, fetal tachycardia and remote from delivery status  POSTOPERATIVE DIAGNOSES: The same  PROCEDURE: Primary Low Transverse Cesarean Section  SURGEON:  Dr. Elsie LincolnKelly Leggett  ASSISTANT:  Dr. Rulon AbideKeli Chael Urenda  ANESTHESIOLOGIST: Dr. Cam Haiarington  INDICATIONS: Tamara CoolMaysha Briggs is a 28 y.o. G3P1011 at 169w1d here for cesarean section secondary to the indications listed under preoperative diagnoses; please see preoperative note for further details.  The risks of cesarean section were discussed with the patient including but were not limited to: bleeding which may require transfusion or reoperation; infection which may require antibiotics; injury to bowel, bladder, ureters or other surrounding organs; injury to the fetus; need for additional procedures including hysterectomy in the event of a life-threatening hemorrhage; placental abnormalities wth subsequent pregnancies, incisional problems, thromboembolic phenomenon and other postoperative/anesthesia complications.   The patient concurred with the proposed plan, giving informed written consent for the procedure.    FINDINGS:  Viable female infant in cephalic presentation.  Apgars 5 and 8.  Cord pH 7.131.  Minimal clear amniotic fluid.  Intact placenta, three vessel cord.  Normal uterus, fallopian tubes and ovaries bilaterally. Small bowel oddly colored and appeared thickened in the walls.    ANESTHESIA: Spinal INTRAVENOUS FLUIDS: 1000 ml ESTIMATED BLOOD LOSS: 500 ml URINE OUTPUT:  300 ml SPECIMENS: Placenta sent to pathology COMPLICATIONS: None immediate  PROCEDURE IN DETAIL:  The patient preoperatively received intravenous antibiotics, AZT, and had sequential compression devices applied to her lower extremities.  She was then taken to the operating room where spinal anesthesia was administered and was  found to be adequate. She was then placed in a dorsal supine position with a leftward tilt, and prepped and draped in a sterile manner.  A foley catheter was placed into her bladder and attached to constant gravity.  After an adequate timeout was performed, a Pfannenstiel skin incision was made with scalpel and carried through to the underlying layer of fascia. The fascia was incised in the midline, and this incision was extended bilaterally bluntly and using the Mayo scissors.  Kocher clamps were applied to the superior aspect of the fascial incision and the underlying rectus muscles were dissected off bluntly. A similar process was carried out on the inferior aspect of the fascial incision. The rectus muscles were separated in the midline bluntly and the peritoneum was entered bluntly. Small bowel was noted to be hyperperistaltic with thickened appearing walls and yellow/white coloring. A moist lap was needed to keep the small bowel off the uterus.   Attention was turned to the lower uterine segment where a low transverse hysterotomy was made with a scalpel and extended bilaterally bluntly.  The infant was successfully delivered, the cord was clamped and cut and the infant was handed over to awaiting neonatology team. Uterine massage was then administered, and the placenta delivered intact with a three-vessel cord rapidly leaving some concern for possible abruption. The uterus was then cleared of clot and debris.  The hysterotomy was closed with 0 Vicryl in a running locked fashion, and an imbricating layer was also placed with 0 Vicryl. The pelvis was cleared of all clot and debris. Hemostasis was confirmed on all surfaces.  The moist lap was then removed from the bowel. The peritoneum was reapproximated using 0 Vicryl running stitches. The fascia was then closed using 0 Vicryl in a running fashion.  The subcutaneous layer was irrigated.  The skin was  closed using staples. The patient tolerated the procedure  well. Sponge, lap, instrument and needle counts were correct x 2.  She was taken to the recovery room in stable condition.   Vale HavenKeli L Jocelyn Lowery, MD

## 2013-12-24 NOTE — Transfer of Care (Signed)
Immediate Anesthesia Transfer of Care Note  Patient: Tamara Briggs  Procedure(s) Performed: Procedure(s): CESAREAN SECTION (N/A)  Patient Location: PACU  Anesthesia Type:Spinal  Level of Consciousness: awake, alert  and oriented  Airway & Oxygen Therapy: Patient Spontanous Breathing  Post-op Assessment: Report given to PACU RN and Post -op Vital signs reviewed and stable  Post vital signs: Reviewed and stable  Complications: No apparent anesthesia complications

## 2013-12-25 ENCOUNTER — Encounter: Payer: Self-pay | Admitting: Obstetrics & Gynecology

## 2013-12-25 ENCOUNTER — Encounter (HOSPITAL_COMMUNITY): Payer: Self-pay | Admitting: Obstetrics & Gynecology

## 2013-12-25 DIAGNOSIS — Z9889 Other specified postprocedural states: Secondary | ICD-10-CM

## 2013-12-25 LAB — GLUCOSE, CAPILLARY
GLUCOSE-CAPILLARY: 88 mg/dL (ref 70–99)
GLUCOSE-CAPILLARY: 93 mg/dL (ref 70–99)
Glucose-Capillary: 62 mg/dL — ABNORMAL LOW (ref 70–99)

## 2013-12-25 LAB — RAPID URINE DRUG SCREEN, HOSP PERFORMED
AMPHETAMINES: NOT DETECTED
BENZODIAZEPINES: NOT DETECTED
Barbiturates: NOT DETECTED
Cocaine: NOT DETECTED
OPIATES: NOT DETECTED
Tetrahydrocannabinol: NOT DETECTED

## 2013-12-25 LAB — TYPE AND SCREEN
ABO/RH(D): O POS
ANTIBODY SCREEN: NEGATIVE

## 2013-12-25 LAB — BASIC METABOLIC PANEL
BUN: 11 mg/dL (ref 6–23)
CHLORIDE: 95 meq/L — AB (ref 96–112)
CO2: 23 mEq/L (ref 19–32)
Calcium: 8.2 mg/dL — ABNORMAL LOW (ref 8.4–10.5)
Creatinine, Ser: 0.48 mg/dL — ABNORMAL LOW (ref 0.50–1.10)
GFR calc non Af Amer: >90 mL/min (ref >90)
Glucose, Bld: 110 mg/dL — ABNORMAL HIGH (ref 70–99)
POTASSIUM: 3.5 meq/L — AB (ref 3.7–5.3)
Sodium: 131 mEq/L — ABNORMAL LOW (ref 137–147)

## 2013-12-25 LAB — CBC
HCT: 28.2 % — ABNORMAL LOW (ref 36.0–46.0)
Hemoglobin: 9.6 g/dL — ABNORMAL LOW (ref 12.0–15.0)
MCH: 28.5 pg (ref 26.0–34.0)
MCHC: 34 g/dL (ref 30.0–36.0)
MCV: 83.7 fL (ref 78.0–100.0)
Platelets: 131 10*3/uL — ABNORMAL LOW (ref 150–400)
RBC: 3.37 MIL/uL — ABNORMAL LOW (ref 3.87–5.11)
RDW: 13.6 % (ref 11.5–15.5)
WBC: 9.4 10*3/uL (ref 4.0–10.5)

## 2013-12-25 MED ORDER — OXYCODONE-ACETAMINOPHEN 5-325 MG PO TABS: 1.0000 | ORAL_TABLET | ORAL | Status: DC | PRN

## 2013-12-25 MED ORDER — LACTATED RINGERS IV SOLN: INTRAVENOUS | Status: DC

## 2013-12-25 MED ORDER — OXYTOCIN 40 UNITS IN LACTATED RINGERS INFUSION - SIMPLE MED: 62.5000 mL/h | INTRAVENOUS | Status: DC

## 2013-12-25 MED ORDER — LIDOCAINE HCL (PF) 1 % IJ SOLN: 30.0000 mL | INTRAMUSCULAR | Status: DC | PRN

## 2013-12-25 MED ORDER — CITRIC ACID-SODIUM CITRATE 334-500 MG/5ML PO SOLN: 30.0000 mL | ORAL | Status: DC | PRN

## 2013-12-25 MED ORDER — LACTATED RINGERS IV SOLN: 500.0000 mL | INTRAVENOUS | Status: DC | PRN

## 2013-12-25 MED ORDER — IBUPROFEN 600 MG PO TABS: 600.0000 mg | ORAL_TABLET | Freq: Four times a day (QID) | ORAL | Status: DC | PRN

## 2013-12-25 MED ORDER — ACETAMINOPHEN 325 MG PO TABS: 650.0000 mg | ORAL_TABLET | ORAL | Status: DC | PRN

## 2013-12-25 MED ORDER — OXYTOCIN BOLUS FROM INFUSION: 500.0000 mL | INTRAVENOUS | Status: DC
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
## 2013-12-25 NOTE — Progress Notes (Signed)
Subjective: Postpartum Day 1: Cesarean Delivery Patient reports being sad after baby's death early this morning.  No N/V/D.  Minimal flatus.  Minimal vaginal bleeding.  No CP, SOB.  Objective: Vital signs in last 24 hours: Temp:  [97.7 F (36.5 C)-99.4 F (37.4 C)] 97.8 F (36.6 C) (05/04 0600) Pulse Rate:  [73-118] 73 (05/04 0600) Resp:  [13-19] 16 (05/04 0600) BP: (91-136)/(52-76) 102/52 mmHg (05/04 0600) SpO2:  [93 %-100 %] 98 % (05/04 0600) Weight:  [113 lb 8 oz (51.483 kg)] 113 lb 8 oz (51.483 kg) (05/04 0600)  Physical Exam:  General: alert, cooperative and no distress Lochia: appropriate Uterine Fundus: firm Abdomen:  Softly distended with gas Incision: bandage in place DVT Evaluation: No evidence of DVT seen on physical exam. Negative Homan's sign.   Recent Labs  12/24/13 1705 12/25/13 0515  HGB 10.9* 9.6*  HCT 32.5* 28.2*    Assessment/Plan: Status post Cesarean section. Doing well postoperatively.  Continue current care. Social Work Corporate treasurerAdvance diet slowly.  Lesly DukesKelly H Loyal Rudy 12/25/2013, 7:05 AM

## 2013-12-25 NOTE — Progress Notes (Signed)
Ur chart review completed.  

## 2013-12-25 NOTE — Anesthesia Postprocedure Evaluation (Signed)
Anesthesia Post Note  Patient: Tamara Briggs  Procedure(s) Performed: Procedure(s) (LRB): CESAREAN SECTION (N/A)  Anesthesia type: SAB  Patient location: Mother/Baby  Post pain: Pain level controlled  Post assessment: Post-op Vital signs reviewed  Last Vitals:  Filed Vitals:   12/25/13 0600  BP: 102/52  Pulse: 73  Temp: 36.6 C  Resp: 16    Post vital signs: Reviewed  Level of consciousness: awake  Complications: No apparent anesthesia complications

## 2013-12-25 NOTE — Progress Notes (Signed)
CSW met with MOB to offer support as she grieves the loss of her baby.  MOB was initially alone in her room, but during our conversation, her step-father, Ronalee Belts, entered and MOB stated we could continue to talk about anything with him present.  MOB states she is getting by as best she can today in regards to the loss of her daughter.  She was receptive to support and condolences offered by CSW and seemed appreciative of the visit.  She reports having a good relationship with FOB, but states she thinks he blames her for the baby's death.  She also states he is going to [place] to be with his family and will not be home when she gets home.  CSW asked if there was somewhere else she could go for support and assistance as she healed.  She states she is going home and her sister will be at the home with her to assist her.  CSW specifically asked if FOB treats her well and she answered "yes."  She states they have been together a year.  She also states, however, that she would like to get her own place.  She states she was on the housing list and when she had reached the top of the list, her mail was going to her mother's house and she did not receive her letter that she had received an apartment before it had already been given to the next person on the list.  She states this was in 2010.  She receives medical care at [facility] for her mental health diagnoses and care at [facility] for HIV.  She plans to continue care at both facilities at discharge.  CSW discussed funeral arrangements with MOB and she states she would like to have baby cremated.  She asked that CSW find the least expensive funeral service to handle this as she has no income.  CSW will research this for patient and get back to her.  She was very Patent attorney.  She states her sister is also helping her and for CSW to talk with her sister about arrangements.  MOB's sister and her step-father appear to CSW to be her greatest supports at this time.  CSW  asked MOB if there is anything else CSW should know or anything she feels CSW can assist her with as far as referrals to better her situation.  She states no needs at this time.  CSW provided her with CSW's contact information and will follow up regarding funeral arrangements.  CSW asked her to call if she has questions or needs in the future.

## 2013-12-25 NOTE — Addendum Note (Signed)
Addendum created 12/25/13 0736 by Jhonnie GarnerBeth M Ramatoulaye Pack, CRNA   Modules edited: Notes Section   Notes Section:  File: [id number]

## 2013-12-25 NOTE — Progress Notes (Signed)
Ms Solon AugustaBynum was tearful during our visit as she talked about her baby, Aiyana.  She reported that her mother is not supportive, that her boyfriend is her main support (along with a few friends that she has who she reports have come to visit).  She reports meeting her boyfriend at a bus stop when she was homeless.  They now live together and he pays the bills as well as cooks and cleans for her.    She showed appropriate grieving as we talked about her baby.  I will make sure that she has resources for support before she is discharged.  She was receptive to my presence and I will provide her with my card for follow up grief support.    Cent[employer] CorporationChaplain Katy Amiyah Shryock Pager, [phone] 11:01 AM   12/25/13 1000  Clinical Encounter Type  Visited With Patient  Visit Type Spiritual support  Referral From Chaplain;Nurse  Stress Factors  Patient Stress Factors Loss

## 2013-12-25 NOTE — Progress Notes (Signed)
I visited with Tamara Briggs and her sister who is here with her.  They are concerned about the pictures as they are not in the box.  [facility] touch with [facility] NICU to try to locate.  Tamara Briggs's sister is having a difficult time taking in the news herself and neither were aware that Tamara Briggs, [name]'s boyfriend would not be able to visit today.    Spiritual Care will follow as we are able, but please page as needs arise.  [employer] CorporationChaplain Katy Nataliyah Packham Pager, [phone] 2:05 PM   12/25/13 1300  Clinical Encounter Type  Visited With Patient and family together  Visit Type Spiritual support;Follow-up

## 2013-12-26 ENCOUNTER — Encounter (HOSPITAL_COMMUNITY): Payer: Self-pay | Admitting: Emergency Medicine

## 2013-12-26 ENCOUNTER — Encounter (HOSPITAL_COMMUNITY): Payer: Self-pay | Admitting: *Deleted

## 2013-12-26 DIAGNOSIS — Z8669 Personal history of other diseases of the nervous system and sense organs: Secondary | ICD-10-CM | POA: Insufficient documentation

## 2013-12-26 DIAGNOSIS — R109 Unspecified abdominal pain: Secondary | ICD-10-CM | POA: Insufficient documentation

## 2013-12-26 DIAGNOSIS — Z21 Asymptomatic human immunodeficiency virus [HIV] infection status: Secondary | ICD-10-CM | POA: Insufficient documentation

## 2013-12-26 DIAGNOSIS — F209 Schizophrenia, unspecified: Secondary | ICD-10-CM | POA: Insufficient documentation

## 2013-12-26 DIAGNOSIS — Z79899 Other long term (current) drug therapy: Secondary | ICD-10-CM | POA: Insufficient documentation

## 2013-12-26 DIAGNOSIS — O909 Complication of the puerperium, unspecified: Principal | ICD-10-CM

## 2013-12-26 DIAGNOSIS — F329 Major depressive disorder, single episode, unspecified: Secondary | ICD-10-CM | POA: Insufficient documentation

## 2013-12-26 DIAGNOSIS — F3289 Other specified depressive episodes: Secondary | ICD-10-CM | POA: Insufficient documentation

## 2013-12-26 DIAGNOSIS — F411 Generalized anxiety disorder: Secondary | ICD-10-CM | POA: Insufficient documentation

## 2013-12-26 DIAGNOSIS — O99335 Smoking (tobacco) complicating the puerperium: Secondary | ICD-10-CM | POA: Insufficient documentation

## 2013-12-26 DIAGNOSIS — O99345 Other mental disorders complicating the puerperium: Secondary | ICD-10-CM | POA: Insufficient documentation

## 2013-12-26 DIAGNOSIS — G8918 Other acute postprocedural pain: Secondary | ICD-10-CM | POA: Insufficient documentation

## 2013-12-26 DIAGNOSIS — O26899 Other specified pregnancy related conditions, unspecified trimester: Secondary | ICD-10-CM | POA: Insufficient documentation

## 2013-12-26 LAB — CBC WITH DIFFERENTIAL/PLATELET
BASOS PCT: 0 % (ref 0–1)
Basophils Absolute: 0 10*3/uL (ref 0.0–0.1)
Eosinophils Absolute: 0.1 10*3/uL (ref 0.0–0.7)
Eosinophils Relative: 1 % (ref 0–5)
HEMATOCRIT: 30.4 % — AB (ref 36.0–46.0)
Hemoglobin: 9.8 g/dL — ABNORMAL LOW (ref 12.0–15.0)
Lymphocytes Relative: 15 % (ref 12–46)
Lymphs Abs: 1 10*3/uL (ref 0.7–4.0)
MCH: 27.7 pg (ref 26.0–34.0)
MCHC: 32.2 g/dL (ref 30.0–36.0)
MCV: 85.9 fL (ref 78.0–100.0)
MONO ABS: 0.7 10*3/uL (ref 0.1–1.0)
Monocytes Relative: 11 % (ref 3–12)
Neutro Abs: 4.6 10*3/uL (ref 1.7–7.7)
Neutrophils Relative %: 73 % (ref 43–77)
Platelets: 147 10*3/uL — ABNORMAL LOW (ref 150–400)
RBC: 3.54 MIL/uL — ABNORMAL LOW (ref 3.87–5.11)
RDW: 14.1 % (ref 11.5–15.5)
WBC: 6.4 10*3/uL (ref 4.0–10.5)

## 2013-12-26 LAB — GLUCOSE, CAPILLARY: Glucose-Capillary: 72 mg/dL (ref 70–99)

## 2013-12-26 LAB — COMPREHENSIVE METABOLIC PANEL
ALBUMIN: 2.2 g/dL — AB (ref 3.5–5.2)
ALT: 15 U/L (ref 0–35)
AST: 30 U/L (ref 0–37)
Alkaline Phosphatase: 105 U/L (ref 39–117)
BUN: 13 mg/dL (ref 6–23)
CALCIUM: 8.2 mg/dL — AB (ref 8.4–10.5)
CHLORIDE: 103 meq/L (ref 96–112)
CO2: 24 meq/L (ref 19–32)
CREATININE: 0.56 mg/dL (ref 0.50–1.10)
GFR calc Af Amer: >90 mL/min (ref >90)
GFR calc non Af Amer: >90 mL/min (ref >90)
Glucose, Bld: 112 mg/dL — ABNORMAL HIGH (ref 70–99)
Potassium: 3.6 mEq/L — ABNORMAL LOW (ref 3.7–5.3)
SODIUM: 139 meq/L (ref 137–147)
Total Bilirubin: 0.4 mg/dL (ref 0.3–1.2)
Total Protein: 6.9 g/dL (ref 6.0–8.3)

## 2013-12-26 MED ORDER — IBUPROFEN 600 MG PO TABS: 600.0000 mg | ORAL_TABLET | Freq: Four times a day (QID) | ORAL | Status: DC

## 2013-12-26 MED ORDER — OXYCODONE-ACETAMINOPHEN 5-325 MG PO TABS
1.0000 | ORAL_TABLET | Freq: Once | ORAL | Status: AC
  Administered 2013-12-26: 1 via ORAL
  Filled 2013-12-26: qty 1

## 2013-12-26 MED ORDER — OXYCODONE-ACETAMINOPHEN 5-325 MG PO TABS: 1.0000 | ORAL_TABLET | ORAL | Status: DC | PRN

## 2013-12-26 MED ORDER — DOCUSATE SODIUM 100 MG PO CAPS: 100.0000 mg | ORAL_CAPSULE | Freq: Two times a day (BID) | ORAL | Status: DC

## 2013-12-26 NOTE — ED Notes (Signed)
Pt. reports post surgical pain at incision site , pt. had a [facility] today did fill prescription for pain . Denies fever or chills.

## 2013-12-26 NOTE — Discharge Instructions (Signed)
Postpartum Care After Cesarean Delivery °After you deliver your newborn (postpartum period), the usual stay in the hospital is 24 72 hours. If there were problems with your labor or delivery, or if you have other medical problems, you might be in the hospital longer.  °While you are in the hospital, you will receive help and instructions on how to care for yourself and your newborn during the postpartum period.  °While you are in the hospital: °· It is normal for you to have pain or discomfort from the incision in your abdomen. Be sure to tell your nurses when you are having pain, where the pain is located, and what makes the pain worse. °· If you are breastfeeding, you may feel uncomfortable contractions of your uterus for a couple of weeks. This is normal. The contractions help your uterus get back to normal size. °· It is normal to have some bleeding after delivery. °· For the first 1 3 days after delivery, the flow is red and the amount may be similar to a period. °· It is common for the flow to start and stop. °· In the first few days, you may pass some small clots. Let your nurses know if you begin to pass large clots or your flow increases. °· Do not  flush blood clots down the toilet before having the nurse look at them. °· During the next 3 10 days after delivery, your flow should become more watery and pink or brown-tinged in color. °· Ten to fourteen days after delivery, your flow should be a small amount of yellowish-white discharge. °· The amount of your flow will decrease over the first few weeks after delivery. Your flow may stop in 6 8 weeks. Most women have had their flow stop by 12 weeks after delivery. °· You should change your sanitary pads frequently. °· Wash your hands thoroughly with soap and water for at least 20 seconds after changing pads, using the toilet, or before holding or feeding your newborn. °· Your intravenous (IV) tubing will be removed when you are drinking enough fluids. °· The  urine drainage tube (urinary catheter) that was inserted before delivery may be removed within 6 8 hours after delivery or when feeling returns to your legs. You should feel like you need to empty your bladder within the first 6 8 hours after the catheter has been removed. °· In case you become weak, lightheaded, or faint, call your nurse before you get out of bed for the first time and before you take a shower for the first time. °· Within the first few days after delivery, your breasts may begin to feel tender and full. This is called engorgement. Breast tenderness usually goes away within 48 72 hours after engorgement occurs. You may also notice milk leaking from your breasts. If you are not breastfeeding, do not stimulate your breasts. Breast stimulation can make your breasts produce more milk. °· Spending as much time as possible with your newborn is very important. During this time, you and your newborn can feel close and get to know each other. Having your newborn stay in your room (rooming in) will help to strengthen the bond with your newborn. It will give you time to get to know your newborn and become comfortable caring for your newborn. °· Your hormones change after delivery. Sometimes the hormone changes can temporarily cause you to feel sad or tearful. These feelings should not last more than a few days. If these feelings last longer   than that, you should talk to your caregiver. °· If desired, talk to your caregiver about methods of family planning or contraception. °· Talk to your caregiver about immunizations. Your caregiver may want you to have the following immunizations before leaving the hospital: °· Tetanus, diphtheria, and pertussis (Tdap) or tetanus and diphtheria (Td) immunization. It is very important that you and your family (including grandparents) or others caring for your newborn are up-to-date with the Tdap or Td immunizations. The Tdap or Td immunization can help protect your newborn  from getting ill. °· Rubella immunization. °· Varicella (chickenpox) immunization. °· Influenza immunization. You should receive this annual immunization if you did not receive the immunization during your pregnancy. °Document Released: 05/04/2012 Document Reviewed: 05/04/2012 °Exit[employer]® Patient Information ©2014 [employer]. ° °

## 2013-12-26 NOTE — Progress Notes (Signed)
12/26/13 1300  Clinical Encounter Type  Visited With Patient not available;Health care provider  Visit Type Follow-up  Referral From Chaplain  Spiritual Encounters  Spiritual Needs Grief support   Attempted follow-up visit to offer further bereavement support, but Tawanda was busy receiving care and support from Lulu Ridingolleen Shaw, Lock[place]CSW, and representative from funeral home.  Spiritual Care understands that pt will be discharged soon and is currently receiving bereavement support from SW.  Have consulted with RN re follow-up support; she plans to page if further chaplain support is indicated/desired before pt's departure.  Thank you.  [address]Chaplain Tiney Zipper Indian SpringsLundeen, South DakotaMDiv [phone]

## 2013-12-26 NOTE — Discharge Summary (Signed)
Attestation of Attending Supervision of Advanced Practitioner (CNM/NP): Evaluation and management procedures were performed by the Advanced Practitioner under my supervision and collaboration.  I have reviewed the Advanced Practitioner's note and chart, and I agree with the management and plan.  Willodean RosenthalCarolyn Harraway-Smith 9:53 PM

## 2013-12-26 NOTE — Progress Notes (Signed)
CSW met with patient to further offer support and assist in arranging cremation services for baby's remains.  Patient met with representative from [place] [facility] [place] funeral home and completed paperwork to consent for baby's remains to be cremated.  Patient understands that there is a $50.00 fee for this service and that FOB must also sign consent before the cremation can take place.  Patient asked that CSW talk with her step-father, Ronalee Belts.  CSW spoke with him on the phone and explained this to him, per patient's request.  They plan to go to the funeral home on Thursday morning to take care of these things.  Patient asked CSW if she could see the baby again before the body is released to the funeral home.  CSW discouraged patient from this since baby has already had an autopsy and explained to patient that CSW was not sure this would be possible, but would inquire with the house coverage RN.  CSW spoke with Arbie Cookey M/AC who called Pathology at [facility].  She informed CSW that it is not a possibility to see baby at this point.  CSW informed patient and she was understanding.  Patient states no further questions, concerns or needs for CSW at this point and thanked CSW for the intervention and support.

## 2013-12-26 NOTE — Progress Notes (Signed)
Subjective: Postpartum Day 2: Cesarean Delivery Patient reports incisional pain, tolerating PO, + flatus and no problems voiding.   Pt reports flatus, abdominal discomfort, tolerating PO. Feels mildly depressed. Objective: Vital signs in last 24 hours: Temp:  [97.9 F (36.6 C)-99.1 F (37.3 C)] 98.2 F (36.8 C) (05/05 0548) Pulse Rate:  [71-88] 78 (05/05 0548) Resp:  [18] 18 (05/05 0548) BP: (100-118)/(46-67) 103/61 mmHg (05/05 0548) SpO2:  [98 %-100 %] 100 % (05/05 0548)  Physical Exam:  General: alert, cooperative, appears older than stated age and no distress Lochia: appropriate Uterine Fundus: firm, Tender abdomen mildly distended and tympanic. +BS Incision: c/d/i pressure dressing in place DVT Evaluation: No evidence of DVT seen on physical exam.   Recent Labs  12/24/13 1705 12/25/13 0515  HGB 10.9* 9.6*  HCT 32.5* 28.2*    Assessment/Plan: Status post Cesarean section. Doing well postoperatively. continue to work with SW regarding care for infant. chaplain PRN, contact HIV clinic today to inform she delivered and coordinate follow up. Desires depo for contraception  Continue current care.  Minta BalsamMichael R Fonda Rochon 12/26/2013, 9:08 AM

## 2013-12-26 NOTE — Discharge Summary (Signed)
Physician Obstetric Discharge Summary  Patient ID: Tamara Briggs MRN: [id number] DOB/AGE: 09/06/1985 28 y.o.  Reason for Admission: PPROM on 12/14/13, admitted to antenatal since that time, note did leave AMA x 1 hour (since returned and readmitted on 12/23/13, following incident with FOB belligerent attitude towards staff) Prenatal Procedures: NST, BMZ x 2 doses, Magnesium (tocolysis) Intrapartum Procedures: cesarean: low cervical, transverse (primary) Postpartum Procedures: continue HIV anti-retroviral therapy, MMR vaccine Complications-Operative and Postpartum: none  Operative Delivery Note PREOPERATIVE DIAGNOSES: Intrauterine pregnancy at 51w1dweeks gestation; PROM, repetetive prolonged decelerations, fetal tachycardia and remote from delivery status  POSTOPERATIVE DIAGNOSES: The same  PROCEDURE: Primary Low Transverse Cesarean Section  SURGEON: Dr. KSilas [place] ASSISTANT: Dr. KEbbie Latus ANESTHESIOLOGIST: Dr. CListon Alba FINDINGS: Viable female infant in cephalic presentation. Apgars 5 and 8. Cord pH 7.131. Minimal clear amniotic fluid. Intact placenta, three vessel cord. Normal uterus, fallopian tubes and ovaries bilaterally. Small bowel oddly colored and appeared thickened in the walls.  ANESTHESIA: Spinal  INTRAVENOUS FLUIDS: 1000 ml  ESTIMATED BLOOD LOSS: 500 ml  URINE OUTPUT: 300 ml  SPECIMENS: Placenta sent to pathology  COMPLICATIONS: None immediate  H/H:  Lab Results  Component Value Date/Time   HGB 9.6* 12/25/2013  5:15 AM   HCT 28.2* 12/25/2013  5:15 AM    Brief Hospital Course: Tamara Montelongois a [id number] had been admitted to antenatal since 12/14/13 (@ 29.5 wks) following PPROM. Significant PMH/complications includes HIV+ (last labs 12/12/13, CD4 290, VL 133) on HAART, GDM(A1) started Metformin 5055mBID, schizophrenia, hx substance abuse with recent cocaine use, prior hx NTD s/p repaired. Her initial antenatal course was mostly uncomplicated, she remained stable, significant  with one 24-48 hour period of suspected progression with increasing contractions, responded well with Magnesium as tocolysis. Completed BMZ course for fetal lung maturity.  Significant event on 12/24/13 with increased FHT (tachy to 180s) with decreased variability and variable decels x 2 down to FHEnterpriseMaternal temp at 99.38F, clinically consistent with chorioamnionitis, received Tylenol, Amp/Gent IV, and plans to proceed to C-section delivery (primary LTCS), decision for emergent C/S with FHT down to 3075sProceeded to primary LTCS on 12/24/2013. At delivery, baby with noted tone, resp effort, and HR >100, following cord clamping, stim, warming, baby with decreased HR (< 100 x 1 minute) with poor resp effort, PPV x 1 min. At 3 min of life, ETT successfully placed and confirmed, improved HR and improved color change, O2 sat at 80%. Eventually transported to NICU with continued bagging via ETT and supplemental O2. 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  Maternal postpartum course was uncomplicated following surgery for further details of this surgery, please refer to the operative note. By time of discharge on POD/PPD#2, her pain was controlled on oral pain medications; she had appropriate lochia and was ambulating, voiding without difficulty, tolerating regular diet and passing flatus (and small BM). She was deemed stable for discharge to home. CSW provided assistance and support for arranging baby's cremation services with funeral home.  Physical Exam:  General: alert and cooperative Lochia: appropriate Uterine Fundus: firm Incision: healing well, no significant drainage DVT Evaluation: No evidence of DVT seen on physical exam. Negative Homan's  sign. No cords or calf tenderness. No significant calf/ankle edema.  Discharge Diagnoses: Pre-term delivery via emergent  primary LTCS for fetal distress, bradycardia in setting of potential maternal chorioamnionitis  Discharge Information: Date: 12/26/2013 Activity: pelvic rest Diet: routine Baby feeding: n/a Contraception: Depo-Provera Medications: PNV, Ibuprofen, Colace and Percocet Discharged Condition: good Instructions: refer to practice specific booklet Discharge to: home  Signed: Nobie Briggs, Briggs, PGY-1 12/26/2013, 12:19 PM

## 2013-12-27 ENCOUNTER — Emergency Department (HOSPITAL_COMMUNITY)
Admission: EM | Admit: 2013-12-27 | Discharge: 2013-12-27 | Disposition: A | Discharge status: Home / Self Care | Payer: Medicaid Other | Attending: Emergency Medicine | Admitting: Emergency Medicine

## 2013-12-27 DIAGNOSIS — G8918 Other acute postprocedural pain: Secondary | ICD-10-CM

## 2013-12-27 MED ORDER — ACETAMINOPHEN 500 MG PO CAPS: 500.0000 mg | ORAL_CAPSULE | ORAL | Status: DC | PRN

## 2013-12-27 MED ORDER — ACETAMINOPHEN 500 MG PO TABS
1000.0000 mg | ORAL_TABLET | Freq: Four times a day (QID) | ORAL | Status: DC | PRN
  Administered 2013-12-27: 1000 mg via ORAL
  Filled 2013-12-27: qty 2

## 2013-12-27 NOTE — ED Provider Notes (Signed)
CSN: [id number]     Arrival date & time 12/26/13  2233 History   First MD Initiated Contact with Patient 12/27/13 0032     Chief Complaint  Patient presents with  . Abdominal Pain   HPI  History provided by the patient and recent medical chart. The patient is a 28 year old female with history of HIV, schizophrenia, chronic cocaine abuse and recent pregnancy who presents with complaints of continued pain from cesarean section incision. Patient reports having cesarean section last week at 6 months pregnant. She reports having stillborn birth. She was in the hospital and discharged earlier in the day. She reports having continued pain around the incision similar to the pains for the past few days. She was given a prescription for Percocet but states this causes her nausea and she did not purchase the medicine. She has not taken anything for the pain. She denies any worsening pain. Denies any bleeding or drainage from the wounds. No fever, chills or sweats. No other changes in symptoms.   Past Medical History  Diagnosis Date  . Crack cocaine use   . Anxiety   . Depression   . Schizophrenia   . Bipolar disorder   . 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 Otitis media of left ear 03/15/2013  . Infection    Past Surgical History  Procedure Laterality Date  . Colposcopy  2009  . Cesarean section N/A 12/24/2013    Procedure: CESAREAN SECTION;  Surgeon: Lesly DukesKelly H Leggett, MD;  Location: [facility];  Service: Obstetrics;  Laterality: N/A;   Family History  Problem Relation Age of Onset  . Diabetes Mother    History  Substance Use Topics  . Smoking status: Current Every Day Smoker -- 0.50 packs/day for 3 years    Types: Cigarettes  . Smokeless tobacco: Never Used     Comment: cutting back  . Alcohol Use: No   OB History   Grav Para Term Preterm Abortions TAB SAB Ect Mult Living   3 2 1 1 1  0 1 0 0 2     Review of Systems  Constitutional: Negative for fever, chills and diaphoresis.  Gastrointestinal: Negative  for nausea and vomiting.  All other systems reviewed and are negative.     Allergies  Review of patient's allergies indicates no known allergies.  Home Medications   Prior to Admission medications   Medication Sig Start Date End Date Taking? Authorizing Provider  atazanavir (REYATAZ) 200 MG capsule Take 2 capsules (400 mg total) by mouth daily with breakfast. 10/26/13   Judyann Munsonynthia Snider, MD  cetirizine-pseudoephedrine (ZYRTEC-D) 5-120 MG per tablet Take 1 tablet by mouth 2 (two) times daily. 11/16/13   Lesly DukesKelly H Leggett, MD  docusate sodium (COLACE) 100 MG capsule Take 1 capsule (100 mg total) by mouth 2 (two) times daily. 12/26/13   Saralyn PilarAlexander Karamalegos, DO  emtricitabine-tenofovir (TRUVADA) 200-300 MG per tablet Take 1 tablet by mouth daily. 03/07/13   Judyann Munsonynthia Snider, MD  ENSURE (ENSURE) Take 237 mLs by mouth 2 (two) times daily between meals. 11/16/13   Lesly DukesKelly H Leggett, MD  ibuprofen (ADVIL,MOTRIN) 600 MG tablet Take 1 tablet (600 mg total) by mouth every 6 (six) hours. 12/26/13   Saralyn PilarAlexander Karamalegos, DO  Lurasidone HCl (LATUDA) 20 MG TABS Take 20 mg by mouth at bedtime.     Historical Provider, MD  oxyCODONE-acetaminophen (PERCOCET/ROXICET) 5-325 MG per tablet Take 1-2 tablets by mouth every 4 (four) hours as needed for severe pain (moderate - severe pain). 12/26/13  Saralyn PilarAlexander Karamalegos, DO  Prenatal Multivit-Min-Fe-FA (PRENATAL VITAMINS) 0.8 MG tablet Take 1 tablet by mouth daily. 10/26/13   Judyann Munsonynthia Snider, MD  ritonavir (NORVIR) 100 MG TABS tablet Take 1 tablet (100 mg total) by mouth daily. 07/31/13   Judyann Munsonynthia Snider, MD  sertraline (ZOLOFT) 50 MG tablet Take 50 mg by mouth daily.    Historical Provider, MD   BP 125/65  Pulse 95  Temp(Src) 98.9 F (37.2 C) (Oral)  Resp 18  Ht 5\' 4"  (1.626 m)  Wt 114 lb (51.71 kg)  BMI 19.56 kg/m2  SpO2 100%  LMP 02/26/2013 Physical Exam  Nursing note and vitals reviewed. Constitutional: She is oriented to person, place, and time. She appears  well-developed and well-nourished. No distress.  HENT:  Head: Normocephalic.  Cardiovascular: Normal rate and regular rhythm.   Pulmonary/Chest: Effort normal and breath sounds normal. No respiratory distress. She has no wheezes. She has no rales.  Abdominal: Soft. There is tenderness. There is no rebound and no guarding.  Incision across the lower abdomen consistent with recent cesarean section. No bleeding or drainage and the dressing. No redness of the skin. There is mild to moderate tenderness around the area and into the abdomen. No guarding or peritoneal signs.  Neurological: She is alert and oriented to person, place, and time.  Skin: Skin is warm and dry. No rash noted.  Psychiatric: She has a normal mood and affect. Her behavior is normal.    ED Course  Procedures   COORDINATION OF CARE:  Nursing notes reviewed. Vital signs reviewed. Initial pt interview and examination performed.   Filed Vitals:   12/26/13 2242  BP: 125/65  Pulse: 95  Temp: 98.9 F (37.2 C)  TempSrc: Oral  Resp: 18  Height: 5\' 4"  (1.626 m)  Weight: 114 lb (51.71 kg)  SpO2: 100%    12:54 AM- patient seen and evaluated. Patient without any significant changes to her pain from the incision. No fever. Normal heart rate and vital signs. Wound appears clean and as expected without signs for infection. We'll give dose of Tylenol. Patient is also requesting prescription of Tylenol be sent to her pharmacy. Patient is stable to be discharged home and continue her normal medications followup with her surgeon.   Treatment plan initiated: Medications  acetaminophen (TYLENOL) tablet 1,000 mg (not administered)  oxyCODONE-acetaminophen (PERCOCET/ROXICET) 5-325 MG per tablet 1 tablet (1 tablet Oral Given 12/26/13 2254)      MDM   Final diagnoses:  Post-operative pain       Angus Sellereter S Kwinton Maahs, PA-C 12/27/13 [phone]

## 2013-12-27 NOTE — ED Provider Notes (Signed)
Medical screening examination/treatment/procedure(s) were performed by non-physician practitioner and as supervising physician I was immediately available for consultation/collaboration.   Javonte Elenes, MD 12/27/13 0732 

## 2013-12-27 NOTE — Discharge Instructions (Signed)
Please followup with your surgeon for continued evaluation and treatment. Return at any time for changing or worsening and does including worsening pain, bleeding, drainage, fever, chills or sweats.

## 2013-12-28 ENCOUNTER — Ambulatory Visit: Payer: Medicaid Other

## 2014-01-02 ENCOUNTER — Ambulatory Visit (INDEPENDENT_AMBULATORY_CARE_PROVIDER_SITE_OTHER): Payer: Medicaid Other | Admitting: *Deleted

## 2014-01-02 VITALS — BP 106/60 | HR 105 | Temp 99.4°F | Wt 110.2 lb

## 2014-01-02 DIAGNOSIS — Z09 Encounter for follow-up examination after completed treatment for conditions other than malignant neoplasm: Secondary | ICD-10-CM

## 2014-01-02 DIAGNOSIS — Z4802 Encounter for removal of sutures: Secondary | ICD-10-CM

## 2014-01-02 NOTE — Progress Notes (Signed)
Staples removed from low transverse incision on lower abdomen.  Incision is well approximated without reddness, swelling, or drainage.  Pt given instructions for sign and symptoms of concern.  Pt/mother verbalize understanding.  Pt is asking about autopsy results.  Informed patient that results are not complete and provider will review on postpartum visit.  Pt verbalizes understanding.

## 2014-01-09 ENCOUNTER — Ambulatory Visit (INDEPENDENT_AMBULATORY_CARE_PROVIDER_SITE_OTHER): Payer: Medicaid Other | Admitting: Internal Medicine

## 2014-01-09 ENCOUNTER — Encounter: Payer: Self-pay | Admitting: Internal Medicine

## 2014-01-09 VITALS — BP 123/74 | HR 85 | Temp 98.6°F | Wt 106.0 lb

## 2014-01-09 DIAGNOSIS — B20 Human immunodeficiency virus [HIV] disease: Secondary | ICD-10-CM

## 2014-01-09 MED ORDER — ATAZANAVIR-COBICISTAT 300-150 MG PO TABS: 1.0000 | ORAL_TABLET | Freq: Every day | ORAL | Status: DC

## 2014-01-09 NOTE — Progress Notes (Signed)
Subjective:    Patient ID: Tamara Briggs, female    DOB: 12/30/1985, 28 y.o.   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 She is here for her first follow up since discharge from hospital. She is still with her boyfriend, bobbie. Also went to her psychiatry appointment last week. Appears not at all tearful during this visit (not necessarily what I would have expected after losing a child). She is also denying any pain at c-section site, no drainage. She has minimal vaginal spotting. No other recent sex partners other than bobbie. She reports that she continues to take her medications as directed.   Current Outpatient Prescriptions on File Prior to Visit  Medication Sig Dispense Refill  . Acetaminophen 500 MG coapsule Take 1 capsule (500 mg total) by mouth every 4 (four) hours as needed for pain.  30 capsule  0  . atazanavir (REYATAZ) 200 MG capsule Take 2 capsules (400 mg total) by mouth daily with breakfast.  60 capsule  5  . cetirizine-pseudoephedrine (ZYRTEC-D) 5-120 MG per tablet Take 1 tablet by mouth 2 (two) times daily.  30 tablet  6  . docusate sodium (COLACE) 100 MG capsule Take 1 capsule (100 mg total) by mouth 2 (two) times daily.  10 capsule  0  . emtricitabine-tenofovir (TRUVADA) 200-300 MG per tablet Take 1 tablet by mouth daily.  30 tablet  11  . ENSURE (ENSURE) Take 237 mLs by mouth 2 (two) times daily between meals.  237 mL  11  . ibuprofen (ADVIL,MOTRIN) 600 MG tablet Take 1 tablet (600 mg total) by mouth every 6 (six) hours.  30 tablet  0  . Lurasidone HCl (LATUDA) 20 MG TABS Take 20 mg by mouth at bedtime.       Marland Kitchen. oxyCODONE-acetaminophen (PERCOCET/ROXICET) 5-325 MG per tablet Take 1-2 tablets by mouth every 4 (four) hours as needed for severe pain (moderate - severe pain).  30 tablet  0  . Prenatal Multivit-Min-Fe-FA (PRENATAL  VITAMINS) 0.8 MG tablet Take 1 tablet by mouth daily.  30 tablet  12  . ritonavir (NORVIR) 100 MG TABS tablet Take 1 tablet (100 mg total) by mouth daily.  30 tablet  11  . sertraline (ZOLOFT) 50 MG tablet Take 50 mg by mouth daily.       No current facility-administered medications on file prior to visit.   Active Ambulatory Problems    Diagnosis Date Noted  . HIV (human immunodeficiency virus infection) 07/27/2011  . Chlamydia and Gonorrhea 07/27/2011  . H/O abnormal cervical Papanicolaou smear 07/26/2008  . Tobacco abuse 03/15/2013  . h/o crack Cocaine abuse 03/15/2013  . h/o oral Thrush 03/15/2013  . Protein-calorie malnutrition, severe 03/16/2013  . Pyelonephritis 07/21/2013  . Maternal HIV positive complicating pregnancy in second trimester, antepartum 10/18/2013  . Schizophrenia 10/18/2013  . Abnormal findings on antenatal screening 10/27/2013  . 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 Preterm premature rupture of membranes (PPROM) delivered, current hospitalization 12/14/2013  . Normocytic anemia 12/14/2013  . S/P C-section 12/25/2013   Resolved Ambulatory Problems    Diagnosis Date Noted  . Underweight 07/27/2011  . Acute otitis externa of left ear 03/15/2013  . acute otitis media 03/15/2013  . Unspecified protein-calorie malnutrition 03/15/2013  . Gonorrhea 03/15/2013  . Acute sinusitis 03/15/2013  . Hyponatremia 07/21/2013  . Hypokalemia 07/21/2013  . Undernutrition 10/18/2013  .  Abnormal antenatal AFP screen 10/26/2013   Past Medical History  Diagnosis Date  . Crack cocaine use   . Anxiety   . Depression   . Bipolar disorder   . 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 Otitis media of left ear 03/15/2013  . Infection      Soc hx: no sex work  Review of Systems 10 point ros is negative    Objective:   Physical Exam BP 123/74  Pulse 85  Temp(Src) 98.6 F (37 C) (Oral)  Wt 106 lb (48.081 kg)  Breastfeeding? No Physical Exam  Constitutional:  oriented to  person, place, and time. appears well-developed and well-nourished. No distress.  HENT:  Mouth/Throat: Oropharynx is clear and moist. No oropharyngeal exudate.  Cardiovascular: Normal rate, regular rhythm and normal heart sounds. Exam reveals no gallop and no friction rub.  No murmur heard.  Pulmonary/Chest: Effort normal and breath sounds normal. No respiratory distress.  has no wheezes.  Abdominal: Soft. Bowel sounds are normal.  exhibits no distension. There is no tenderness along surgical incision site. Well healed. Lymphadenopathy: no cervical adenopathy. Neurological: alert and oriented to person, place, and time.  Skin: Skin is warm and dry. No rash noted. No erythema.  Psychiatric: a normal mood and affect.  behavior is normal.         Assessment & Plan:  hiv = will switch her to truvada plus evotaz and discontinue atazanavir and ritonavir that she was previously taking for pregnancy regimen. Will keep pi based regimen for now.  Schizophrenia = managed by psychiatry. She may have slight increase in latuda levels with evotaz but not significantly different than her current regimen. Will monitor for excess sedation  rtc in 4-6 wk to see how she is doing.

## 2014-01-17 ENCOUNTER — Encounter: Payer: Self-pay | Admitting: General Practice

## 2014-02-05 ENCOUNTER — Ambulatory Visit: Payer: Medicaid Other | Admitting: Obstetrics & Gynecology

## 2014-02-05 ENCOUNTER — Telehealth: Payer: Self-pay | Admitting: *Deleted

## 2014-02-05 NOTE — Telephone Encounter (Signed)
Attempted to call patient, no answer, left message for patient to call clinic and reschedule appointment.  

## 2014-02-12 ENCOUNTER — Telehealth: Payer: Self-pay

## 2014-02-12 NOTE — Telephone Encounter (Signed)
Patient left message on triage voice mail to please call.   No answer upon return call.  Left message at [phone] as requested by patient.   Laurell Josephsammy K King, RN

## 2014-03-06 ENCOUNTER — Ambulatory Visit (INDEPENDENT_AMBULATORY_CARE_PROVIDER_SITE_OTHER): Payer: Medicaid Other | Admitting: Internal Medicine

## 2014-03-06 ENCOUNTER — Encounter: Payer: Self-pay | Admitting: Internal Medicine

## 2014-03-06 VITALS — BP 110/72 | HR 75 | Temp 98.9°F | Ht 64.0 in | Wt 104.0 lb

## 2014-03-06 DIAGNOSIS — B20 Human immunodeficiency virus [HIV] disease: Secondary | ICD-10-CM

## 2014-03-06 LAB — CBC WITH DIFFERENTIAL/PLATELET
BASOS ABS: 0 10*3/uL (ref 0.0–0.1)
Basophils Relative: 0 % (ref 0–1)
EOS PCT: 3 % (ref 0–5)
Eosinophils Absolute: 0.1 10*3/uL (ref 0.0–0.7)
HCT: 37.6 % (ref 36.0–46.0)
Hemoglobin: 12.6 g/dL (ref 12.0–15.0)
LYMPHS ABS: 1.4 10*3/uL (ref 0.7–4.0)
LYMPHS PCT: 34 % (ref 12–46)
MCH: 27.3 pg (ref 26.0–34.0)
MCHC: 33.5 g/dL (ref 30.0–36.0)
MCV: 81.4 fL (ref 78.0–100.0)
Monocytes Absolute: 0.4 10*3/uL (ref 0.1–1.0)
Monocytes Relative: 9 % (ref 3–12)
NEUTROS ABS: 2.2 10*3/uL (ref 1.7–7.7)
Neutrophils Relative %: 54 % (ref 43–77)
PLATELETS: 194 10*3/uL (ref 150–400)
RBC: 4.62 MIL/uL (ref 3.87–5.11)
RDW: 14 % (ref 11.5–15.5)
WBC: 4.1 10*3/uL (ref 4.0–10.5)

## 2014-03-06 NOTE — Progress Notes (Signed)
Subjective:    Patient ID: Tamara Briggs, female    DOB: [birth year], 28 y.o.   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 On evotaz/truvada. She reports doing well with her HIV medications and staying adherent. She is taking her latuda. Still is with her boyfriend. She nods "yes" that she is doing ok with the death of her premature infant.  Still in care with her psychiatrist. Denies having any other sex partners  Current Outpatient Prescriptions on File Prior to Visit  Medication Sig Dispense Refill  . atazanavir-cobicistat (EVOTAZ) 300-150 MG per tablet Take 1 tablet by mouth daily. Swallow whole. Do NOT crush, cut or chew tablet. Take with food.  30 tablet  11  . emtricitabine-tenofovir (TRUVADA) 200-300 MG per tablet Take 1 tablet by mouth daily.  30 tablet  11  . ENSURE (ENSURE) Take 237 mLs by mouth 2 (two) times daily between meals.  237 mL  11  . Lurasidone HCl (LATUDA) 20 MG TABS Take 20 mg by mouth at bedtime.       . Prenatal Multivit-Min-Fe-FA (PRENATAL VITAMINS) 0.8 MG tablet Take 1 tablet by mouth daily.  30 tablet  12  . sertraline (ZOLOFT) 50 MG tablet Take 50 mg by mouth daily.      . Acetaminophen 500 MG coapsule Take 1 capsule (500 mg total) by mouth every 4 (four) hours as needed for pain.  30 capsule  0  . cetirizine-pseudoephedrine (ZYRTEC-D) 5-120 MG per tablet Take 1 tablet by mouth 2 (two) times daily.  30 tablet  6  . docusate sodium (COLACE) 100 MG capsule Take 1 capsule (100 mg total) by mouth 2 (two) times daily.  10 capsule  0  . ibuprofen (ADVIL,MOTRIN) 600 MG tablet Take 1 tablet (600 mg total) by mouth every 6 (six) hours.  30 tablet  0  . oxyCODONE-acetaminophen (PERCOCET/ROXICET) 5-325 MG per tablet Take 1-2 tablets by mouth every 4 (four) hours as needed for severe pain (moderate - severe pain).  30 tablet  0   No current facility-administered medications on file prior to visit.   Active Ambulatory Problems   Diagnosis Date Noted  . HIV (human immunodeficiency virus infection) 07/27/2011  . Chlamydia and Gonorrhea 07/27/2011  . H/O abnormal cervical Papanicolaou smear 07/26/2008  . Tobacco abuse 03/15/2013  . h/o crack Cocaine abuse 03/15/2013  . h/o oral Thrush 03/15/2013  . Protein-calorie malnutrition, severe 03/16/2013  . Pyelonephritis 07/21/2013  . Maternal HIV positive complicating pregnancy in second trimester, antepartum 10/18/2013  . Schizophrenia 10/18/2013  . Abnormal findings on antenatal screening 10/27/2013  . 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 Preterm premature rupture of membranes (PPROM) delivered, current hospitalization 12/14/2013  . Normocytic anemia 12/14/2013  . S/P C-section 12/25/2013   Resolved Ambulatory Problems    Diagnosis Date Noted  . Underweight 07/27/2011  . Acute otitis externa of left ear 03/15/2013  . acute otitis media 03/15/2013  . Unspecified protein-calorie malnutrition 03/15/2013  . Gonorrhea 03/15/2013  . Acute sinusitis 03/15/2013  . Hyponatremia 07/21/2013  . Hypokalemia 07/21/2013  . Undernutrition 10/18/2013  . Abnormal antenatal AFP screen 10/26/2013   Past Medical History  Diagnosis Date  . Crack cocaine use   . Anxiety   . Depression   . Bipolar disorder   . 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 Otitis media of left ear 03/15/2013  . Infection       Review of Systems 10 point  ros is negative    Objective:   Physical Exam BP 110/72  Pulse 75  Temp(Src) 98.9 F (37.2 C) (Oral)  Ht 5\' 4"  (1.626 m)  Wt 104 lb (47.174 kg)  BMI 17.84 kg/m2 Physical Exam  Constitutional:  oriented to person, place, and time. appears well-developed and well-nourished. No distress.  HENT: left eyelid? Lesion ? Molluscum. Tongue projected to the right Mouth/Throat: Oropharynx is clear and moist. No oropharyngeal exudate.  Cardiovascular: Normal rate, regular rhythm and normal heart sounds. Exam reveals no gallop and no friction rub.  No murmur  heard.  Pulmonary/Chest: Effort normal and breath sounds normal. No respiratory distress.  has no wheezes.  Abdominal: Soft. Bowel sounds are normal.  exhibits no distension. There is no tenderness.  Lymphadenopathy: no cervical adenopathy.  Neurological: alert and oriented to person, place, and time.  Skin: Skin is warm and dry. No rash noted. No erythema.  Psychiatric: mood slightly blunted, less expressive, ? Extrapyramidal effects tongue projected to right during the visit        Assessment & Plan:  hiv = failry well controlled will check labs today  Health maintenance = to get depo next week  Schizophrenia = may need decrease in dose seems slightly blunted in her affect. Asked her to see psychiatry.  rtc in 2-3 months.

## 2014-03-07 ENCOUNTER — Ambulatory Visit (INDEPENDENT_AMBULATORY_CARE_PROVIDER_SITE_OTHER): Payer: Medicaid Other | Admitting: Obstetrics and Gynecology

## 2014-03-07 ENCOUNTER — Encounter: Payer: Self-pay | Admitting: Obstetrics and Gynecology

## 2014-03-07 VITALS — BP 100/71 | HR 69 | Temp 97.4°F | Ht 62.0 in | Wt 103.9 lb

## 2014-03-07 DIAGNOSIS — O42919 Preterm premature rupture of membranes, unspecified as to length of time between rupture and onset of labor, unspecified trimester: Secondary | ICD-10-CM

## 2014-03-07 DIAGNOSIS — O4290 Premature rupture of membranes, unspecified as to length of time between rupture and onset of labor, unspecified weeks of gestation: Secondary | ICD-10-CM

## 2014-03-07 LAB — COMPLETE METABOLIC PANEL WITH GFR
ALBUMIN: 4.1 g/dL (ref 3.5–5.2)
ALT: 15 U/L (ref 0–35)
AST: 21 U/L (ref 0–37)
Alkaline Phosphatase: 81 U/L (ref 39–117)
BUN: 13 mg/dL (ref 6–23)
CALCIUM: 8.5 mg/dL (ref 8.4–10.5)
CO2: 25 mEq/L (ref 19–32)
CREATININE: 1.07 mg/dL (ref 0.50–1.10)
Chloride: 100 mEq/L (ref 96–112)
GFR, EST AFRICAN AMERICAN: 81 mL/min
GFR, Est Non African American: 70 mL/min
Glucose, Bld: 101 mg/dL — ABNORMAL HIGH (ref 70–99)
Potassium: 3.6 mEq/L (ref 3.5–5.3)
Sodium: 134 mEq/L — ABNORMAL LOW (ref 135–145)
Total Bilirubin: 1.2 mg/dL (ref 0.2–1.2)
Total Protein: 7.6 g/dL (ref 6.0–8.3)

## 2014-03-07 LAB — POCT PREGNANCY, URINE: Preg Test, Ur: NEGATIVE

## 2014-03-07 NOTE — Progress Notes (Signed)
  Subjective:     Tamara Briggs is a 28 y.o. female [phone] who presents for a postpartum visit. She is 9 weeks postpartum following primary emergent low transverse Cesarean section. She was inpt on AN Unit with PPPROM from 27 wks and [facility] and then had early NND. Autopsy report not back. I have fully reviewed the prenatal and intrapartum course. The delivery was at 29 gestational weeks. Outcome: primary cesarean section, low transverse incision. Anesthesia: spinal. Postpartum course has been uncomplicated. Bleeding no bleeding. Bowel function is normal. Bladder function is normal. Patient is sexually active. Contraception method is condoms. Postpartum depression screening: negative. Denies that she is still grieving.  She did she her ID specialist MD yesterday and had labs. She has appointment with her mental health provider on 03/22/14. States she is taking all meds as directed. She wants to become pregnant and specifically declines Depo-provera and any other contraceptive today. Amenorrheic since delivery.  The following portions of the patient's history were reviewed and updated as appropriate: allergies, current medications, past family history, past medical history, past social history, past surgical history and problem list.    Patient Active Problem List   Diagnosis Date Noted  . S/P C-section 12/25/2013  . Preterm premature rupture of membranes (PPROM) delivered, current hospitalization 12/14/2013  . Normocytic anemia 12/14/2013  . 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 Abnormal findings on antenatal screening 10/27/2013  . Maternal HIV positive complicating pregnancy in second trimester, antepartum 10/18/2013  . Schizophrenia 10/18/2013  . Pyelonephritis 07/21/2013  . Protein-calorie malnutrition, severe 03/16/2013  . Tobacco abuse 03/15/2013  . h/o crack Cocaine abuse 03/15/2013  . h/o oral Thrush 03/15/2013  . HIV (human immunodeficiency virus  infection) 07/27/2011  . Chlamydia and Gonorrhea 07/27/2011  . H/O abnormal cervical Papanicolaou smear 07/26/2008  Review of Systems Pertinet items in HPI   Objective:    BP 100/71  Pulse 69  Temp(Src) 97.4 F (36.3 C) (Oral)  Ht 5\' 2"  (1.575 m)  Wt 103 lb 14.4 oz (47.129 kg)  BMI 19.00 kg/m2  Breastfeeding? No  General:  alert, cachectic, no distress and slowed mentation   Breasts:  inspection negative, no nipple discharge or bleeding, no masses or nodularity palpable  Lungs: clear to auscultation bilaterally  Heart:  regular rate and rhythm, S1, S2 normal, no murmur, click, rub or gallop  Abdomen: soft, non-tender; bowel sounds normal; no masses,  no organomegaly Pfannensteil incision well healed. Uterus well involuted                          Results for orders placed in visit on 03/07/14 (from the past 24 hour(s))  POCT PREGNANCY, URINE     Status: None   Collection Time    03/07/14  2:53 PM      Result Value Ref Range   Preg Test, Ur NEGATIVE  NEGATIVE   Assessment:     9wks postpartum exam. Pap smear not done at today's visit.   Plan:    1. Contraception: condoms Long discussion re: recommendation to get on LARC  and strongly advised not to get pregnant even before having one menstrual period. She declines any contraception.  2. Keep menstrual calendar and take PNV 1/d if not on contraception. Need early prenatal care.  3. Follow up in: 6 months or as needed.

## 2014-03-07 NOTE — Patient Instructions (Signed)
Contraception Choices Contraception (birth control) is the use of any methods or devices to prevent pregnancy. Below are some methods to help avoid pregnancy. HORMONAL METHODS   Contraceptive implant. This is a thin, plastic tube containing progesterone hormone. It does not contain estrogen hormone. Your health care provider inserts the tube in the inner part of the upper arm. The tube can remain in place for up to 3 years. After 3 years, the implant must be removed. The implant prevents the ovaries from releasing an egg (ovulation), thickens the cervical mucus to prevent sperm from entering the uterus, and thins the lining of the inside of the uterus.  Progesterone-only injections. These injections are given every 3 months by your health care provider to prevent pregnancy. This synthetic progesterone hormone stops the ovaries from releasing eggs. It also thickens cervical mucus and changes the uterine lining. This makes it harder for sperm to survive in the uterus.  Birth control pills. These pills contain estrogen and progesterone hormone. They work by preventing the ovaries from releasing eggs (ovulation). They also cause the cervical mucus to thicken, preventing the sperm from entering the uterus. Birth control pills are prescribed by a health care provider.Birth control pills can also be used to treat heavy periods.  Minipill. This type of birth control pill contains only the progesterone hormone. They are taken every day of each month and must be prescribed by your health care provider.  Birth control patch. The patch contains hormones similar to those in birth control pills. It must be changed once a week and is prescribed by a health care provider.  Vaginal ring. The ring contains hormones similar to those in birth control pills. It is left in the vagina for 3 weeks, removed for 1 week, and then a new one is put back in place. The patient must be comfortable inserting and removing the ring  from the vagina.A health care provider's prescription is necessary.  Emergency contraception. Emergency contraceptives prevent pregnancy after unprotected sexual intercourse. This pill can be taken right after sex or up to 5 days after unprotected sex. It is most effective the sooner you take the pills after having sexual intercourse. Most emergency contraceptive pills are available without a prescription. Check with your pharmacist. Do not use emergency contraception as your only form of birth control. BARRIER METHODS   Female condom. This is a thin sheath (latex or rubber) that is worn over the penis during sexual intercourse. It can be used with spermicide to increase effectiveness.  Female condom. This is a soft, loose-fitting sheath that is put into the vagina before sexual intercourse.  Diaphragm. This is a soft, latex, dome-shaped barrier that must be fitted by a health care provider. It is inserted into the vagina, along with a spermicidal jelly. It is inserted before intercourse. The diaphragm should be left in the vagina for 6 to 8 hours after intercourse.  Cervical cap. This is a round, soft, latex or plastic cup that fits over the cervix and must be fitted by a health care provider. The cap can be left in place for up to 48 hours after intercourse.  Sponge. This is a soft, circular piece of polyurethane foam. The sponge has spermicide in it. It is inserted into the vagina after wetting it and before sexual intercourse.  Spermicides. These are chemicals that kill or block sperm from entering the cervix and uterus. They come in the form of creams, jellies, suppositories, foam, or tablets. They do not require a   prescription. They are inserted into the vagina with an applicator before having sexual intercourse. The process must be repeated every time you have sexual intercourse. INTRAUTERINE CONTRACEPTION  Intrauterine device (IUD). This is a T-shaped device that is put in a woman's uterus  during a menstrual period to prevent pregnancy. There are 2 types:  Copper IUD. This type of IUD is wrapped in copper wire and is placed inside the uterus. Copper makes the uterus and fallopian tubes produce a fluid that kills sperm. It can stay in place for 10 years.  Hormone IUD. This type of IUD contains the hormone progestin (synthetic progesterone). The hormone thickens the cervical mucus and prevents sperm from entering the uterus, and it also thins the uterine lining to prevent implantation of a fertilized egg. The hormone can weaken or kill the sperm that get into the uterus. It can stay in place for 3-5 years, depending on which type of IUD is used. PERMANENT METHODS OF CONTRACEPTION  Female tubal ligation. This is when the woman's fallopian tubes are surgically sealed, tied, or blocked to prevent the egg from traveling to the uterus.  Hysteroscopic sterilization. This involves placing a small coil or insert into each fallopian tube. Your doctor uses a technique called hysteroscopy to do the procedure. The device causes scar tissue to form. This results in permanent blockage of the fallopian tubes, so the sperm cannot fertilize the egg. It takes about 3 months after the procedure for the tubes to become blocked. You must use another form of birth control for these 3 months.  Female sterilization. This is when the female has the tubes that carry sperm tied off (vasectomy).This blocks sperm from entering the vagina during sexual intercourse. After the procedure, the man can still ejaculate fluid (semen). NATURAL PLANNING METHODS  Natural family planning. This is not having sexual intercourse or using a barrier method (condom, diaphragm, cervical cap) on days the woman could become pregnant.  Calendar method. This is keeping track of the length of each menstrual cycle and identifying when you are fertile.  Ovulation method. This is avoiding sexual intercourse during ovulation.  Symptothermal  method. This is avoiding sexual intercourse during ovulation, using a thermometer and ovulation symptoms.  Post-ovulation method. This is timing sexual intercourse after you have ovulated. Regardless of which type or method of contraception you choose, it is important that you use condoms to protect against the transmission of sexually transmitted infections (STIs). Talk with your health care provider about which form of contraception is most appropriate for you. Document Released: 08/10/2005 Document Revised: 08/15/2013 Document Reviewed: 02/02/2013 [employer] Patient Information 2015 [employer]. This information is not intended to replace advice given to you by your health care provider. Make sure you discuss any questions you have with your health care provider.  

## 2014-03-08 LAB — HIV-1 RNA QUANT-NO REFLEX-BLD

## 2014-03-08 LAB — T-HELPER CELL (CD4) - (RCID CLINIC ONLY)
CD4 % Helper T Cell: 31 % — ABNORMAL LOW (ref 33–55)
CD4 T CELL ABS: 430 /uL (ref 400–2700)

## 2014-03-15 ENCOUNTER — Ambulatory Visit (INDEPENDENT_AMBULATORY_CARE_PROVIDER_SITE_OTHER): Payer: Medicaid Other | Admitting: Licensed Clinical Social Worker

## 2014-03-15 ENCOUNTER — Other Ambulatory Visit: Payer: Self-pay | Admitting: *Deleted

## 2014-03-15 DIAGNOSIS — Z3009 Encounter for other general counseling and advice on contraception: Secondary | ICD-10-CM

## 2014-03-15 DIAGNOSIS — K047 Periapical abscess without sinus: Secondary | ICD-10-CM

## 2014-03-15 DIAGNOSIS — B20 Human immunodeficiency virus [HIV] disease: Secondary | ICD-10-CM

## 2014-03-15 DIAGNOSIS — Z21 Asymptomatic human immunodeficiency virus [HIV] infection status: Secondary | ICD-10-CM

## 2014-03-15 DIAGNOSIS — Z30013 Encounter for initial prescription of injectable contraceptive: Secondary | ICD-10-CM

## 2014-03-15 MED ORDER — MEDROXYPROGESTERONE ACETATE 150 MG/ML IM SUSP
150.0000 mg | Freq: Once | INTRAMUSCULAR | Status: AC
  Administered 2014-03-15: 150 mg via INTRAMUSCULAR

## 2014-03-15 MED ORDER — AMOXICILLIN 500 MG PO CAPS: 500.0000 mg | ORAL_CAPSULE | Freq: Three times a day (TID) | ORAL | Status: DC

## 2014-03-15 NOTE — Addendum Note (Signed)
Addended by: Starleen ArmsYARBOROUGH, Jermar Colter D on: 03/15/2014 04:53 PM   Modules accepted: Orders

## 2014-03-21 LAB — POCT URINE PREGNANCY: Preg Test, Ur: NEGATIVE

## 2014-03-22 ENCOUNTER — Encounter: Payer: Self-pay | Admitting: Internal Medicine

## 2014-03-22 ENCOUNTER — Ambulatory Visit (INDEPENDENT_AMBULATORY_CARE_PROVIDER_SITE_OTHER): Payer: Medicaid Other | Admitting: Internal Medicine

## 2014-03-22 VITALS — BP 107/72 | HR 81 | Temp 98.3°F | Wt 104.0 lb

## 2014-03-22 DIAGNOSIS — K047 Periapical abscess without sinus: Secondary | ICD-10-CM

## 2014-03-22 DIAGNOSIS — L089 Local infection of the skin and subcutaneous tissue, unspecified: Secondary | ICD-10-CM

## 2014-03-22 DIAGNOSIS — B20 Human immunodeficiency virus [HIV] disease: Secondary | ICD-10-CM

## 2014-03-22 MED ORDER — SULFAMETHOXAZOLE-TMP DS 800-160 MG PO TABS: 1.0000 | ORAL_TABLET | Freq: Every day | ORAL | Status: DC

## 2014-03-22 NOTE — Progress Notes (Signed)
Subjective:    Patient ID: Tamara Briggs, female    DOB: 10/18/1985, 28 y.o.   MRN: [id number]  HPI 28 yo F with HIV, CD 4 count 430/VL<20. On complera. Abscess + tooth right lower area improved on amoxicillin. Now has left upper eyelid swelling with pimple.  No Known Allergies  Current Outpatient Prescriptions on File Prior to Visit  Medication Sig Dispense Refill  . Acetaminophen 500 MG coapsule Take 1 capsule (500 mg total) by mouth every 4 (four) hours as needed for pain.  30 capsule  0  . amoxicillin (AMOXIL) 500 MG capsule Take 1 capsule (500 mg total) by mouth 3 (three) times daily.  30 capsule  0  . atazanavir-cobicistat (EVOTAZ) 300-150 MG per tablet Take 1 tablet by mouth daily. Swallow whole. Do NOT crush, cut or chew tablet. Take with food.  30 tablet  11  . cetirizine-pseudoephedrine (ZYRTEC-D) 5-120 MG per tablet Take 1 tablet by mouth 2 (two) times daily.  30 tablet  6  . docusate sodium (COLACE) 100 MG capsule Take 1 capsule (100 mg total) by mouth 2 (two) times daily.  10 capsule  0  . emtricitabine-tenofovir (TRUVADA) 200-300 MG per tablet Take 1 tablet by mouth daily.  30 tablet  11  . Lurasidone HCl (LATUDA) 20 MG TABS Take 20 mg by mouth at bedtime.       . sertraline (ZOLOFT) 50 MG tablet Take 50 mg by mouth daily.      Marland Kitchen. ENSURE (ENSURE) Take 237 mLs by mouth 2 (two) times daily between meals.  237 mL  11  . ibuprofen (ADVIL,MOTRIN) 600 MG tablet Take 1 tablet (600 mg total) by mouth every 6 (six) hours.  30 tablet  0  . oxyCODONE-acetaminophen (PERCOCET/ROXICET) 5-325 MG per tablet Take 1-2 tablets by mouth every 4 (four) hours as needed for severe pain (moderate - severe pain).  30 tablet  0  . Prenatal Multivit-Min-Fe-FA (PRENATAL VITAMINS) 0.8 MG tablet Take 1 tablet by mouth daily.  30 tablet  12   No current facility-administered medications on file prior to visit.   Active Ambulatory Problems    Diagnosis Date Noted  . HIV (human immunodeficiency virus  infection) 07/27/2011  . Chlamydia and Gonorrhea 07/27/2011  . H/O abnormal cervical Papanicolaou smear 07/26/2008  . Tobacco abuse 03/15/2013  . h/o crack Cocaine abuse 03/15/2013  . h/o oral Thrush 03/15/2013  . Protein-calorie malnutrition, severe 03/16/2013  . Pyelonephritis 07/21/2013  . Maternal HIV positive complicating pregnancy in second trimester, antepartum 10/18/2013  . Schizophrenia 10/18/2013  . Abnormal findings on antenatal screening 10/27/2013  . 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 Preterm premature rupture of membranes (PPROM) delivered, current hospitalization 12/14/2013  . Normocytic anemia 12/14/2013  . S/P C-section 12/25/2013   Resolved Ambulatory Problems    Diagnosis Date Noted  . Underweight 07/27/2011  . Acute otitis externa of left ear 03/15/2013  . acute otitis media 03/15/2013  . Unspecified protein-calorie malnutrition 03/15/2013  . Gonorrhea 03/15/2013  . Acute sinusitis 03/15/2013  . Hyponatremia 07/21/2013  . Hypokalemia 07/21/2013  . Undernutrition 10/18/2013  . Abnormal antenatal AFP screen 10/26/2013   Past Medical History  Diagnosis Date  . Crack cocaine use   . Anxiety   . Depression   . Bipolar disorder   . 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 Otitis media of left ear 03/15/2013  . Infection      Review of Systems Tooth/jaw pain and eyelid swelling. Other 10 point  ros is negative    Objective:   Physical Exam BP 107/72  Pulse 81  Temp(Src) 98.3 F (36.8 C) (Oral)  Wt 104 lb (47.174 kg)  Breastfeeding? No Physical Exam  Constitutional:  oriented to person, place, and time. appears well-developed and well-nourished. No distress.  HENT: left upper eyelid is swollen with small whitehead/pimple noted in the center. No erythema or injected conjunctiva on mucosal surface of eyelid. No pain with EOM Mouth/Throat: Oropharynx is clear and moist. No oropharyngeal exudate.  Cardiovascular: Normal rate, regular rhythm and normal heart  sounds. Exam reveals no gallop and no friction rub.  No murmur heard.  Pulmonary/Chest: Effort normal and breath sounds normal. No respiratory distress.  has no wheezes.  Lymphadenopathy: no cervical adenopathy.  Neurological: alert and oriented to person, place, and time.  Skin: Skin is warm and dry. No rash noted. No erythema. See HENT exam Psychiatric: a normal mood and affect. behavior is normal.         Assessment & Plan:  Furuncle to left eye lid= will 7 day course of bactrim. It does not appear to be consistent with cellulitis. Will call next week to see if it is improving  Dental abscess/poor dentition = refer to dentistry. Finish up course of amoxicillin for dental abscess  hiv = well controlled  Birth control = received depo shot last week

## 2014-03-30 ENCOUNTER — Other Ambulatory Visit: Payer: Self-pay | Admitting: Licensed Clinical Social Worker

## 2014-03-30 DIAGNOSIS — B20 Human immunodeficiency virus [HIV] disease: Secondary | ICD-10-CM

## 2014-03-30 MED ORDER — EMTRICITABINE-TENOFOVIR DF 200-300 MG PO TABS: 1.0000 | ORAL_TABLET | Freq: Every day | ORAL | Status: DC

## 2014-04-12 ENCOUNTER — Other Ambulatory Visit: Payer: Self-pay | Admitting: Licensed Clinical Social Worker

## 2014-04-12 MED ORDER — SERTRALINE HCL 50 MG PO TABS: 50.0000 mg | ORAL_TABLET | Freq: Every day | ORAL | Status: DC

## 2014-04-13 ENCOUNTER — Telehealth: Payer: Self-pay | Admitting: *Deleted

## 2014-04-13 NOTE — Telephone Encounter (Signed)
Patient called to advise she is in pain and needs strong medicine. Advised her we would have to see her to give her any pain med if we would even give her anything. A person who identifies himself as her boyfriend then takes the phone and starts to talk in a very loud and aggressive tone tells me that "the patient is so Dumb she can not tell us exactly what is going on but she has not been able to walk since she left the hospital from having the cesarean that killed his baby!"  Advised the caller that we have seen the patient several time and she walks just fine however I will let Dr Drue SecondSnider know she called and what she needs. Tried to make patient an appt and we were disconnected.

## 2014-04-24 ENCOUNTER — Encounter (HOSPITAL_COMMUNITY): Payer: Self-pay | Admitting: Emergency Medicine

## 2014-04-24 ENCOUNTER — Emergency Department (HOSPITAL_COMMUNITY)
Admission: EM | Admit: 2014-04-24 | Discharge: 2014-04-24 | Disposition: A | Discharge status: Home / Self Care | Payer: Medicaid Other | Attending: Emergency Medicine | Admitting: Emergency Medicine

## 2014-04-24 DIAGNOSIS — Z79899 Other long term (current) drug therapy: Secondary | ICD-10-CM | POA: Insufficient documentation

## 2014-04-24 DIAGNOSIS — A5901 Trichomonal vulvovaginitis: Secondary | ICD-10-CM | POA: Insufficient documentation

## 2014-04-24 DIAGNOSIS — Z3202 Encounter for pregnancy test, result negative: Secondary | ICD-10-CM | POA: Diagnosis not present

## 2014-04-24 DIAGNOSIS — R269 Unspecified abnormalities of gait and mobility: Secondary | ICD-10-CM | POA: Insufficient documentation

## 2014-04-24 DIAGNOSIS — N898 Other specified noninflammatory disorders of vagina: Secondary | ICD-10-CM | POA: Diagnosis present

## 2014-04-24 DIAGNOSIS — F17200 Nicotine dependence, unspecified, uncomplicated: Secondary | ICD-10-CM | POA: Insufficient documentation

## 2014-04-24 DIAGNOSIS — Z9889 Other specified postprocedural states: Secondary | ICD-10-CM | POA: Diagnosis not present

## 2014-04-24 DIAGNOSIS — F411 Generalized anxiety disorder: Secondary | ICD-10-CM | POA: Diagnosis not present

## 2014-04-24 DIAGNOSIS — F319 Bipolar disorder, unspecified: Secondary | ICD-10-CM | POA: Insufficient documentation

## 2014-04-24 DIAGNOSIS — Z21 Asymptomatic human immunodeficiency virus [HIV] infection status: Secondary | ICD-10-CM | POA: Insufficient documentation

## 2014-04-24 DIAGNOSIS — Z792 Long term (current) use of antibiotics: Secondary | ICD-10-CM | POA: Insufficient documentation

## 2014-04-24 DIAGNOSIS — Z8669 Personal history of other diseases of the nervous system and sense organs: Secondary | ICD-10-CM | POA: Diagnosis not present

## 2014-04-24 LAB — URINE MICROSCOPIC-ADD ON

## 2014-04-24 LAB — URINALYSIS, ROUTINE W REFLEX MICROSCOPIC
Bilirubin Urine: NEGATIVE
Glucose, UA: NEGATIVE mg/dL
Hgb urine dipstick: NEGATIVE
Ketones, ur: NEGATIVE mg/dL
NITRITE: POSITIVE — AB
PH: 7.5 (ref 5.0–8.0)
Protein, ur: NEGATIVE mg/dL
Specific Gravity, Urine: 1.016 (ref 1.005–1.030)
Urobilinogen, UA: 1 mg/dL (ref 0.0–1.0)

## 2014-04-24 LAB — WET PREP, GENITAL: Yeast Wet Prep HPF POC: NONE SEEN

## 2014-04-24 LAB — POC URINE PREG, ED: Preg Test, Ur: NEGATIVE

## 2014-04-24 MED ORDER — DOXYCYCLINE HYCLATE 100 MG PO CAPS: 100.0000 mg | ORAL_CAPSULE | Freq: Two times a day (BID) | ORAL | Status: DC

## 2014-04-24 MED ORDER — LIDOCAINE HCL (PF) 1 % IJ SOLN
INTRAMUSCULAR | Status: AC
  Filled 2014-04-24: qty 5

## 2014-04-24 MED ORDER — LIDOCAINE HCL (PF) 1 % IJ SOLN
0.9000 mL | Freq: Once | INTRAMUSCULAR | Status: AC
  Administered 2014-04-24: 0.9 mL

## 2014-04-24 MED ORDER — METRONIDAZOLE 500 MG PO TABS: 500.0000 mg | ORAL_TABLET | Freq: Two times a day (BID) | ORAL | Status: DC

## 2014-04-24 MED ORDER — CEFTRIAXONE SODIUM 250 MG IJ SOLR
250.0000 mg | Freq: Once | INTRAMUSCULAR | Status: AC
  Administered 2014-04-24: 250 mg via INTRAMUSCULAR
  Filled 2014-04-24: qty 250

## 2014-04-24 NOTE — ED Provider Notes (Signed)
CSN: [id number]     Arrival date & time 04/24/14  4098 History   First MD Initiated Contact with Patient 04/24/14 1143     Chief Complaint  Patient presents with  . Vaginal Bleeding  . Abdominal Pain     (Consider location/radiation/quality/duration/timing/severity/associated sxs/prior Treatment) HPI Chief complaint is for vaginal bleeding intermittent in nature. This has been going on since May. She does not identifying any significant associated pain she does note however sometimes at the incision seems to bother her. There is no report of other vaginal discharge. The patient is known to be HIV positive. Her boyfriend is here with her. He is a significant amount of the associated history. The patient herself seems to have some degree of cognitive delay.  There is a second complaint regarding chronic gait dysfunction. This has been ongoing since the patient's C-section in May per the boyfriend's history. The nature of this is that she apparently walks with a uneven jarring sort of gait. They do report having been seen by other providers and told her that there was nothing acutely wrong. He also reports having been told that there might have been some nerve damage during the C-section. The description of this is quite chronic in nature without any local associated pain. Past Medical History  Diagnosis Date  . Crack cocaine use   . Anxiety   . Depression   . Schizophrenia   . Bipolar disorder   . 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 Otitis media of left ear 03/15/2013  . Infection    Past Surgical History  Procedure Laterality Date  . Colposcopy  2009  . Cesarean section N/A 12/24/2013    Procedure: CESAREAN SECTION;  Surgeon: Lesly Dukes, MD;  Location: [facility];  Service: Obstetrics;  Laterality: N/A;   Family History  Problem Relation Age of Onset  . Diabetes Mother    History  Substance Use Topics  . Smoking status: Current Every Day Smoker -- 0.10 packs/day for 3 years    Types: Cigarettes   . Smokeless tobacco: Never Used     Comment: cutting back  . Alcohol Use: No   OB History   Grav Para Term Preterm Abortions TAB SAB Ect Mult Living   0 1 0 0 2     Review of Systems  Constitutional: Negative for fever and chills.  HENT: Negative for congestion and sinus pressure.   Eyes: Negative for discharge and visual disturbance.  Respiratory: Negative for cough, shortness of breath and wheezing.   Cardiovascular: Negative for chest pain and leg swelling.  Gastrointestinal: Negative for nausea, vomiting and abdominal pain.  Genitourinary: Positive for vaginal bleeding. Negative for dysuria, flank pain, difficulty urinating and genital sores.  Musculoskeletal: Positive for gait problem.  Skin: Negative for color change and rash.  Neurological: Positive for speech difficulty. Negative for dizziness and headaches.      Allergies  Review of patient's allergies indicates no known allergies.  Home Medications   Prior to Admission medications   Medication Sig Start Date End Date Taking? Authorizing Provider  antipyrine-benzocaine Lyla Son) otic solution Place 3 drops into both ears 2 (two) times daily as needed for ear pain.   Yes Historical Provider, MD  atazanavir-cobicistat (EVOTAZ) 300-150 MG per tablet Take 1 tablet by mouth daily. Swallow whole. Do NOT crush, cut or chew tablet. Take with food. 01/09/14  Yes Judyann Munson, MD  emtricitabine-tenofovir (TRUVADA) 200-300 MG per tablet Take 1 tablet by mouth  daily. 03/30/14  Yes Judyann Munson, MD  ibuprofen (ADVIL,MOTRIN) 600 MG tablet Take 600 mg by mouth 2 (two) times daily as needed for moderate pain.   Yes Historical Provider, MD  Lurasidone HCl (LATUDA) 20 MG TABS Take 20 mg by mouth at bedtime.    Yes Historical Provider, MD  sertraline (ZOLOFT) 50 MG tablet Take 1 tablet (50 mg total) by mouth daily. 04/12/14  Yes Judyann Munson, MD   BP 100/74  Pulse 58  Temp(Src) 98.4 F (36.9 C) (Oral)  Resp 18  SpO2  100%  LMP 04/13/2014 Physical Exam Patient is well-nourished well-developed slender female. She does have an appearance of being somewhat cognitively delayed She is awake alert and interactive. Head face is normocephalic atraumatic Pupils are equally round reactive extraocular motions are intact Patient has moist mucous member. He does do some mild repetitive tongue thrusting. Neck is supple. Lungs are clear to auscultation no wheeze rhonchi rail Her is regular no murmur gallop Abdomen soft with some mild suprapubic tenderness. Genital normal external visual inspection. Speculum exam shows moderate amount of whitish discharge in the vaginal vault there is mild cervical friability with collection of specimens. Ears no blood in the vaginal vault. Bimanual examination shows no significant cervical tenderness. Neurologic examination shows 4 extremities to have 5 out of 5 strength testing 2+ DTRs, patellar and Achilles reflexes. Patient spontaneously use his extremities in a coordinated fashion to move herself about the bed. Skin is warm and dry.  ED Course  Procedures (including critical care time) Labs Review Labs Reviewed  URINALYSIS, ROUTINE W REFLEX MICROSCOPIC  POC URINE PREG, ED    Imaging Review No results found.   EKG Interpretation None      MDM   Final diagnoses:  Trichomonal vaginitis  Gait disorder   Patient is positive for Trichomonas she will be treated for gonorrhea and chlamydia as well. She is known to have HIV. Not show signs of acute PID. Regarding the gait complaints. Her strength testing and reflexes are intact. There are a number of possible neurologic etiologies. At this point in time however there does not appear to be any acute emergent etiology. Have counseled the patient and her partner on the necessity for further outpatient workup which will likely include referral to a neurologist they're advised that it is necessary to have a family physician to  coordinate this as well as to pursue further diagnostic tests and referrals as needed.    Arby Barrette, MD 04/24/14 [phone]

## 2014-04-24 NOTE — ED Notes (Signed)
Pt chose to walk out, escorted by staff

## 2014-04-24 NOTE — ED Notes (Signed)
Patient states had c-section in May, has since had some vaginal bleeding.   Patient states has pain off and on from c-section site, with a lot of itching.   Patient's boyfriend states "she don't walk straight since the c-section.   She walked fine when she was pregnant".

## 2014-04-24 NOTE — Discharge Instructions (Signed)
°Emergency Department Resource Guide °1) Find a Doctor and Pay Out of Pocket °Although you won't have to find out who is covered by your insurance plan, it is a good idea to ask around and get recommendations. You will then need to call the office and see if the doctor you have chosen will accept you as a new patient and what types of options they offer for patients who are self-pay. Some doctors offer discounts or will set up payment plans for their patients who do not have insurance, but you will need to ask so you aren't surprised when you get to your appointment. ° °2) Contact Your Local Health Department °Not all health departments have doctors that can see patients for sick visits, but many do, so it is worth a call to see if yours does. If you don't know where your local health department is, you can check in your phone book. The CDC also has a tool to help you locate your state's health department, and many state websites also have listings of all of their local health departments. ° °3) Find a Walk-in Clinic °If your illness is not likely to be very severe or complicated, you may want to try a walk in clinic. These are popping up all over the country in pharmacies, drugstores, and shopping centers. They're usually staffed by nurse practitioners or physician assistants that have been trained to treat common illnesses and complaints. They're usually fairly quick and inexpensive. However, if you have serious medical issues or chronic medical problems, these are probably not your best option. ° °No Primary Care Doctor: °- Call [facility] at  832-8000 - they can help you locate a primary care doctor that  accepts your insurance, provides certain services, etc. °- Physician Referral Service- 1-800-533-3463 ° °Chronic Pain Problems: °Organization         Address  Phone   Notes  °[facility]  (336) 297-2271 Patients need to be referred by their primary care doctor.  ° °Medication  Assistance: °Organization         Address  Phone   Notes  °[place] [facility] [address] °[place], [place] [address] (336) 641-8030 --Must be a resident of [place] °-- Must have NO insurance coverage whatsoever (no Medicaid/ Medicare, etc.) °-- The pt. MUST have a primary care doctor that directs their care regularly and follows them in the community °  °[facility]  (866) 331-1348   °[employer]  (888) 892-1162   ° °Agencies that provide inexpensive medical care: °Organization         Address  Phone   Notes  °[facility]  (336) 832-8035   °[facility]    (336) 832-7272   °[facility] [address] °[place], [place] [address] (336) 832-4777   °[facility] [address], °[place] (336) 271-4999   °[facility]    (336) 373-0678   °[facility]    (336) 272-1050   °[facility] ° [address], [place] Phone:  (336) 832-4444, Fax:  (336) 832-4440 Hours of Operation:  9 am - 6 pm, M-F.  Also accepts Medicaid/Medicare and self-pay.  °[facility] ° [address], [place] Phone: (336) 832-3150, Fax: (336) 832-3151. Hours of Operation:  8:30 am - 5:30 pm, M-F.  Also accepts Medicaid and self-pay.  °[facility] [place] [address], [place] Phone: (336) 878-6027   °[facility] [address], [place], [place] (336)723-1848, Ext. 123 Mondays & Thursdays: 7-9 AM.  First 15 patients are seen on a first come, first serve basis. °  ° °Medicaid-accepting [place] Providers: ° °Organization         Address  Phone   Notes  °[facility] [address], [place] (336) 641-2100 Also accepts self-pay patients.  °[facility] [address], [place] ° (336) 856-9996   °[facility] [address], [place]  (336) 288-8857   °[facility] [address] [place] [address], [place] (336) 299-7000   °Veita Bland [address], [place]  ° (336) 373-1557 Only accepts [place] Access Medicaid patients after they have their name applied to their card.  ° °Self-Pay (no insurance) in [place]: ° °Organization         Address  Phone   Notes  °Sickle Cell Patients, [facility] [address], [place] (336) 832-1970   °[facility] [address], [place] (336) 832-4400   °[facility] ° [address], [place] (336) 992-4800   °[facility]/Dr. Osei-Bonsu ° [address] [place] [address], [place] or [address], [place] (336) 841-8500 Phone number for both [place] and [place] locations is the same.  °[facility] [address], [place] (336) 299-0000   °[facility] [address] [place] [address], [place] or [address] (336) 852-7530 °(336) 878-2260   °[facility] [address], [place] (336) 350-1642, phone; (336) 294-5005, fax Sees patients 1st and 3rd Saturday of every month.  Must not qualify for public or private insurance (i.e. Medicaid, Medicare, [place] Health Choice, Veterans' Benefits) • Household income should be no more than 200% of the poverty level •The clinic cannot treat you if you are pregnant or think you are pregnant • Sexually transmitted diseases are not treated at the clinic.  ° ° °Dental Care: °Organization         Address  Phone  Notes  °[place] [facility] [address], [place] (336) 641-6152 Accepts children up to age 21 who are enrolled in Medicaid or [place] Health Choice; pregnant women with a Medicaid card; and children who have applied for Medicaid or [place] Health Choice, but were declined, whose parents can pay a reduced fee at time of service.  °[place]  [facility] [place]  [address], [place] (336) 641-7733 Accepts children up to age 21 who are enrolled in Medicaid or [place] Health Choice; pregnant women with a Medicaid card; and children who have applied for Medicaid or [place] Health Choice, but were declined, whose parents can pay a reduced fee at time of service.  °[facility] ° [address], [place] (336) 641-4533 Patients are seen by appointment only. Walk-ins are not accepted. [facility] will see patients 18 years of age and older. °Monday - Tuesday (8am-5pm) °Most Wednesdays (8:30-5pm) °$30 per visit, cash only  °[facility] ° [address], [place] (336) 641-4533 Patients are seen by appointment only. Walk-ins are not accepted. [facility] will see patients 18 years of age and older. °One   Wednesday Evening (Monthly: Volunteer Based).  $30 per visit, cash only  °[facility]  (919) 537-3737 for adults; Children under age 4, call Graduate Pediatric Dentistry at ([phone]) [phone]. Children aged 4-14, please call (919) [phone] to request a pediatric application. ° Dental services are provided in all areas of dental care including fillings, crowns and bridges, complete and partial dentures, implants, gum treatment, root canals, and extractions. Preventive care is also provided. Treatment is provided to both adults and children. °Patients are selected via a lottery and there is often a waiting list. °  °[facility] [address], °[place] ° (336) 763-8833 [email or web address] °  °[facility] [address], [place], [place] (336)723-1848, Ext. 123 Second and Fourth Thursday of each month, opens at 6:30 AM; Clinic ends at 9 AM.  Patients are seen on a first-come first-served basis, and a limited number are seen during each clinic.  ° °[facility] ° [address], [place], [place] (336) 723-7904    Eligibility Requirements °You must have lived in [place], [place], or [place] counties for at least the last three months. °  You cannot be eligible for state or federal sponsored healthcare insurance, including Veterans Administration, Medicaid, or Medicare. °  You generally cannot be eligible for healthcare insurance through your employer.  °  How to apply: °Eligibility screenings are held every Tuesday and Wednesday afternoon from 1:00 pm until 4:00 pm. You do not need an appointment for the interview!  °[facility] [address], [place], [place] 336-631-2330   °[facility]  336-342-8273   °[facility]  336-703-3100   °[facility]  336-570-6415   ° °Behavioral Health Resources in the Community: °Intensive Outpatient Programs °Organization         Address  Phone  Notes  °[facility] [address], [place], [place] 336-878-6098   °[facility] [address], [place], [place] 336-832-9800   °[facility] [address], [place], [place] ° 336-882-2125   °[facility] [address],  °[place], [place] 1-800-853-5163 or 336-641-4981   °Substance Abuse Resources °Organization         Address  Phone  Notes  °[facility]  336-882-2125   °[facility]  336-784-9470   °[facility]  336-285-9073   °[facility]  336-845-3988   °Residential & Outpatient Substance Abuse Program  1-800-659-3381   °Psychological Services °Organization         Address  Phone  Notes  °[facility]  336- 832-9600   °[facility]  336- 378-7881   °[facility] [address], [place] 1-800-853-5163 or 336-641-4981   ° °Mobile Crisis Teams °Organization         Address  Phone  Notes  °[facility], Mobile Crisis Care Unit  1-877-626-1772   °Assertive °[facility] ° [address]  [place], [place] 336-834-9664   °Sharon DeEsch [address] °[place] 336-554-5454   ° °Self-Help/Support Groups °Organization         Address  Phone             Notes  °[facility]  - variety of support groups  336- 373-1402 Call for more information  °Narcotics Anonymous (NA), [facility] [address], °[place]  2 meetings at this location  ° °  Residential Treatment Programs Organization         Address  Phone  Notes  [facility] [address]    [place]  4-098-119-1478   [facility]  [address], [place] [address], [place], [place] 308-657-8469   [facility] [address] [place], [place] 629-528-4132 Admissions: 8am-3pm M-F  [facility] [address]    [place], [place] 440-102-7253   [facility] [address] [place], [place], [place] 664-403-4742   [facility] [address],  [place], [place] 595-638-7564   [facility] Intensive Outpatient [address], [place], [place] 332-951-8841   [facility] ([facility] [address] [place]  , [place] 6-606-301-6010 or 934-202-8235   [facility] [address], [place], [place] 025-427-0623 Accepts Medicaid  [facility] [address],  [place] 7-628-315-1761 Substance Abuse/Addiction Treatment   [facility] Organization         Address  Phone  Notes  [facility]  (276)658-8708   Angie Fava, PhD [address] Kn[place], [place]   (541) 634-3417 or [phone]   [facility]   [address] [place], [place] [phone]   [facility] [address], [place], [place] 701-069-7981 Insurance/Medicaid/sponsorship through [facility] and Families [address]                                    [place], [place] [phone] Therapy/tele-psych/case    [facility] [address], [place] 216-522-9470    Dr. Lolly Mustache  [phone]   [facility]  [employer] [facility] Dept. 1) [address] [place] 2) [address] [place] 3)  [address], [place] [phone]  (531)[phone]   [facility] Child Abuse Hotline 5624938725 or 754-283-9486 (After Hours)      Trichomoniasis Trichomoniasis is an infection caused by an organism called Trichomonas. The infection can affect both women and men. In women, the outer female genitalia and the vagina are affected. In men, the penis is mainly affected, but the prostate and other reproductive organs can also be involved. Trichomoniasis is a sexually transmitted infection (STI) and is most often passed to another person through sexual contact.  RISK FACTORS  Having unprotected sexual intercourse.  Having sexual intercourse with an infected partner. SIGNS AND SYMPTOMS  Symptoms of trichomoniasis in women include:  Abnormal gray-green frothy vaginal discharge.  Itching and irritation of the vagina.  Itching and irritation of the area outside the vagina. Symptoms of trichomoniasis in men include:   Penile discharge with or without pain.  Pain during urination. This results from inflammation of the urethra. DIAGNOSIS  Trichomoniasis may be found during a Pap test or physical exam. Your health care provider may use one of the following methods to help diagnose this infection:  Examining vaginal discharge under a microscope. For men, urethral discharge would be examined.  Testing the pH of the vagina with a test tape.  Using a vaginal swab test that checks for the Trichomonas organism. A test is available that provides results within a few minutes.  Doing a culture test for the organism. This is not usually needed. TREATMENT  You may be given medicine to fight the infection. Women should inform their  health care provider if they could be or are pregnant. Some medicines used to treat the infection should not be taken during pregnancy.  Your health care provider may recommend over-the-counter medicines or creams to decrease itching or irritation.  Your sexual partner will need to be treated if infected. HOME CARE INSTRUCTIONS   Take medicines only as directed by your health care provider.  Take over-the-counter medicine for itching or irritation as directed by your health care provider.  Do not have sexual intercourse while you have the infection.  Women should not douche or wear tampons while they have the infection.  Discuss your infection with your partner. Your partner may have gotten the infection from you, or you may have gotten it from your partner.  Have your sex partner get examined and treated if necessary.  Practice safe, informed, and protected sex.  See your health care provider for other STI testing. SEEK MEDICAL CARE IF:   You still have symptoms after you finish your medicine.  You develop abdominal pain.  You have pain when you urinate.  You have bleeding after sexual intercourse.  You develop a rash.  Your medicine makes you sick or makes you throw up (vomit). MAKE SURE YOU:  Understand these instructions.  Will watch your condition.  Will get help right away if you are not doing well or get worse. Document Released: 02/03/2001 Document Revised: 12/25/2013 Document Reviewed: 05/22/2013 [facility] Patient Information 2015 [place], [place]. This information is not intended to replace advice given to you by your health care provider. Make sure you discuss any questions you have with your health care provider.

## 2014-04-25 LAB — GC/CHLAMYDIA PROBE AMP
CT Probe RNA: POSITIVE — AB
GC PROBE AMP APTIMA: NEGATIVE

## 2014-04-26 ENCOUNTER — Telehealth (HOSPITAL_BASED_OUTPATIENT_CLINIC_OR_DEPARTMENT_OTHER): Payer: Self-pay | Admitting: Emergency Medicine

## 2014-04-26 NOTE — Telephone Encounter (Signed)
Post ED Visit - Positive Culture Follow-up  Culture report reviewed by antimicrobial stewardship pharmacist:  Wes Dulaney, Pharm.D., BCPS  Celedonio Miyamoto, Pharm.D., BCPS  Georgina Pillion, [address].D., BCPS  [place], [place].D., BCPS, AAHIVP  Estella Husk, Pharm.D., BCPS, AAHIVP  Carly Sabat, Pharm.D.  Enzo Bi, [address].D.  Positive chlamydia culture Treated with doxycycline  po bid x 7 days, organism sensitive to the same and no further patient follow-up is required at this time.  Berle Mull 04/26/2014, 8:55 AM

## 2014-05-22 ENCOUNTER — Ambulatory Visit (INDEPENDENT_AMBULATORY_CARE_PROVIDER_SITE_OTHER): Payer: Medicaid Other | Admitting: Internal Medicine

## 2014-05-22 ENCOUNTER — Encounter: Payer: Self-pay | Admitting: Internal Medicine

## 2014-05-22 VITALS — BP 111/79 | HR 178 | Temp 97.7°F | Wt 106.0 lb

## 2014-05-22 DIAGNOSIS — R627 Adult failure to thrive: Secondary | ICD-10-CM

## 2014-05-22 DIAGNOSIS — M25562 Pain in left knee: Secondary | ICD-10-CM

## 2014-05-22 DIAGNOSIS — Z23 Encounter for immunization: Secondary | ICD-10-CM

## 2014-05-22 DIAGNOSIS — M25569 Pain in unspecified knee: Secondary | ICD-10-CM

## 2014-05-22 MED ORDER — TRAMADOL HCL 50 MG PO TABS: 50.0000 mg | ORAL_TABLET | Freq: Two times a day (BID) | ORAL | Status: DC | PRN

## 2014-05-22 MED ORDER — ENSURE PO LIQD: 237.0000 mL | Freq: Two times a day (BID) | ORAL | Status: DC

## 2014-05-22 NOTE — Progress Notes (Signed)
Patient ID: Tamara Briggs, female   DOB: Dec 22, 1985, 28 y.o.   MRN: [id number]       Patient ID: Tamara Briggs, female   DOB: 01-30-1986, 28 y.o.   MRN: [id number]  HPI 28yo F with HIV-schizophrenia. CD 4 count of 430/VL<20 on evotaz-truvada. She states taht she is doing well with her medications taking them regularly. She is taking latuda 40mg  lately. No change in dose. [facility]. She has started to have worsening left lower leg pain, radiating from knee down. It worsens after she has been on her feet for a while. "nerve pain" takes ibuprofen and tylenol without much relief.  Outpatient Encounter Prescriptions as of 05/22/2014  Medication Sig  . antipyrine-benzocaine (AURALGAN) otic solution Place 3 drops into both ears 2 (two) times daily as needed for ear pain.  Marland Kitchen atazanavir-cobicistat (EVOTAZ) 300-150 MG per tablet Take 1 tablet by mouth daily. Swallow whole. Do NOT crush, cut or chew tablet. Take with food.  . doxycycline (VIBRAMYCIN) 100 MG capsule Take 1 capsule (100 mg total) by mouth 2 (two) times daily.  Marland Kitchen emtricitabine-tenofovir (TRUVADA) 200-300 MG per tablet Take 1 tablet by mouth daily.  Marland Kitchen ibuprofen (ADVIL,MOTRIN) 600 MG tablet Take 600 mg by mouth 2 (two) times daily as needed for moderate pain.  . Lurasidone HCl (LATUDA) 20 MG TABS Take 20 mg by mouth at bedtime.   . metroNIDAZOLE (FLAGYL) 500 MG tablet Take 1 tablet (500 mg total) by mouth 2 (two) times daily.  . sertraline (ZOLOFT) 50 MG tablet Take 1 tablet (50 mg total) by mouth daily.     Patient Active Problem List   Diagnosis Date Noted  . S/P C-section 12/25/2013  . Preterm premature rupture of membranes (PPROM) delivered, current hospitalization 12/14/2013  . Normocytic anemia 12/14/2013  . 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 Abnormal findings on antenatal screening 10/27/2013  . Maternal HIV positive complicating pregnancy in second trimester, antepartum 10/18/2013  . Schizophrenia 10/18/2013   . Pyelonephritis 07/21/2013  . Protein-calorie malnutrition, severe 03/16/2013  . Tobacco abuse 03/15/2013  . h/o crack Cocaine abuse 03/15/2013  . h/o oral Thrush 03/15/2013  . HIV (human immunodeficiency virus infection) 07/27/2011  . Chlamydia and Gonorrhea 07/27/2011  . H/O abnormal cervical Papanicolaou smear 07/26/2008     Health Maintenance Due  Topic Date Due  . Tetanus/tdap  12/30/2003  . Influenza Vaccine  03/24/2014     Review of Systems  Physical Exam   BP 111/79  Pulse 178  Temp(Src) 97.7 F (36.5 C) (Oral)  Wt 106 lb (48.081 kg)  LMP 04/13/2014  Breastfeeding? No  Lab Results  Component Value Date   CD4TCELL 31* 03/06/2014   Lab Results  Component Value Date   CD4TABS 430 03/06/2014   CD4TABS 290* 12/12/2013   CD4TABS 300* 10/26/2013   Lab Results  Component Value Date   HIV1RNAQUANT <20 03/06/2014   Lab Results  Component Value Date   HEPBSAB POS* 07/09/2011   No results found for this basename: RPR    CBC Lab Results  Component Value Date   WBC 4.1 03/06/2014   RBC 4.62 03/06/2014   HGB 12.6 03/06/2014   HCT 37.6 03/06/2014   PLT 194 03/06/2014   MCV 81.4 03/06/2014   MCH 27.3 03/06/2014   MCHC 33.5 03/06/2014   RDW 14.0 03/06/2014   LYMPHSABS 1.4 03/06/2014   MONOABS 0.4 03/06/2014   EOSABS 0.1 03/06/2014   BASOSABS 0.0 03/06/2014   BMET Lab Results  Component Value Date   NA 134* 03/06/2014   K 3.6 03/06/2014   CL 100 03/06/2014   CO2 25 03/06/2014   GLUCOSE 101* 03/06/2014   BUN 13 03/06/2014   CREATININE 1.07 03/06/2014   CALCIUM 8.5 03/06/2014   GFRNONAA 70 03/06/2014   GFRAA 81 03/06/2014     Assessment and Plan   hiv = well controlled, continue on truvada-evotaz  Health maintenance = will get flu vaccine today  Leg pain = clinical history suggestive of osteoarthritis but young for that to occur. Her gait of favoring ambulating walking on toes at times, also alittle unusual. Will give a trial of tramadol to see if it helps. She sees  neurology oct 7th for eval.  Disability = will write letter of support

## 2014-05-30 ENCOUNTER — Ambulatory Visit: Payer: Medicaid Other | Admitting: Neurology

## 2014-05-30 ENCOUNTER — Telehealth: Payer: Self-pay | Admitting: Neurology

## 2014-05-30 NOTE — Telephone Encounter (Signed)
This patient did not show for the appointment, she canceled 3 hours prior to the appointment.

## 2014-05-31 ENCOUNTER — Ambulatory Visit (INDEPENDENT_AMBULATORY_CARE_PROVIDER_SITE_OTHER): Payer: Medicaid Other | Admitting: Licensed Clinical Social Worker

## 2014-05-31 DIAGNOSIS — Z30013 Encounter for initial prescription of injectable contraceptive: Secondary | ICD-10-CM

## 2014-05-31 MED ORDER — MEDROXYPROGESTERONE ACETATE 150 MG/ML IM SUSP
150.0000 mg | Freq: Once | INTRAMUSCULAR | Status: AC
  Administered 2014-05-31: 150 mg via INTRAMUSCULAR

## 2014-06-01 ENCOUNTER — Emergency Department (HOSPITAL_COMMUNITY)
Admission: EM | Admit: 2014-06-01 | Discharge: 2014-06-01 | Disposition: A | Discharge status: Home / Self Care | Payer: Medicaid Other | Attending: Emergency Medicine | Admitting: Emergency Medicine

## 2014-06-01 ENCOUNTER — Encounter (HOSPITAL_COMMUNITY): Payer: Self-pay | Admitting: Emergency Medicine

## 2014-06-01 DIAGNOSIS — Y9389 Activity, other specified: Secondary | ICD-10-CM | POA: Diagnosis not present

## 2014-06-01 DIAGNOSIS — F419 Anxiety disorder, unspecified: Secondary | ICD-10-CM | POA: Insufficient documentation

## 2014-06-01 DIAGNOSIS — Z23 Encounter for immunization: Secondary | ICD-10-CM | POA: Insufficient documentation

## 2014-06-01 DIAGNOSIS — Z8669 Personal history of other diseases of the nervous system and sense organs: Secondary | ICD-10-CM | POA: Insufficient documentation

## 2014-06-01 DIAGNOSIS — Z720 Tobacco use: Secondary | ICD-10-CM | POA: Insufficient documentation

## 2014-06-01 DIAGNOSIS — F319 Bipolar disorder, unspecified: Secondary | ICD-10-CM | POA: Diagnosis not present

## 2014-06-01 DIAGNOSIS — S199XXA Unspecified injury of neck, initial encounter: Secondary | ICD-10-CM | POA: Diagnosis not present

## 2014-06-01 DIAGNOSIS — F209 Schizophrenia, unspecified: Secondary | ICD-10-CM | POA: Insufficient documentation

## 2014-06-01 DIAGNOSIS — Z21 Asymptomatic human immunodeficiency virus [HIV] infection status: Secondary | ICD-10-CM | POA: Insufficient documentation

## 2014-06-01 DIAGNOSIS — S01511A Laceration without foreign body of lip, initial encounter: Secondary | ICD-10-CM | POA: Insufficient documentation

## 2014-06-01 DIAGNOSIS — Z79899 Other long term (current) drug therapy: Secondary | ICD-10-CM | POA: Insufficient documentation

## 2014-06-01 MED ORDER — METHOCARBAMOL 500 MG PO TABS: 500.0000 mg | ORAL_TABLET | Freq: Two times a day (BID) | ORAL | Status: DC

## 2014-06-01 MED ORDER — IBUPROFEN 800 MG PO TABS: 800.0000 mg | ORAL_TABLET | Freq: Three times a day (TID) | ORAL | Status: DC

## 2014-06-01 MED ORDER — ACETAMINOPHEN 325 MG PO TABS: 325.0000 mg | ORAL_TABLET | Freq: Four times a day (QID) | ORAL | Status: DC | PRN

## 2014-06-01 MED ORDER — IBUPROFEN 400 MG PO TABS: 800.0000 mg | ORAL_TABLET | Freq: Once | ORAL | Status: DC

## 2014-06-01 MED ORDER — TETANUS-DIPHTH-ACELL PERTUSSIS 5-2.5-18.5 LF-MCG/0.5 IM SUSP
0.5000 mL | Freq: Once | INTRAMUSCULAR | Status: AC
  Administered 2014-06-01: 0.5 mL via INTRAMUSCULAR
  Filled 2014-06-01: qty 0.5

## 2014-06-01 MED ORDER — METHOCARBAMOL 500 MG PO TABS
500.0000 mg | ORAL_TABLET | Freq: Once | ORAL | Status: AC
  Administered 2014-06-01: 500 mg via ORAL
  Filled 2014-06-01: qty 1
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
## 2014-06-01 NOTE — ED Notes (Signed)
Pt given ice pack at triage. 
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
## 2014-06-02 NOTE — ED Provider Notes (Signed)
Medical screening examination/treatment/procedure(s) were performed by non-physician practitioner and as supervising physician I was immediately available for consultation/collaboration.   EKG Interpretation None       Shizuko Wojdyla, MD 06/02/14 0007 

## 2014-06-06 ENCOUNTER — Ambulatory Visit: Payer: Medicaid Other | Admitting: Neurology

## 2014-06-06 ENCOUNTER — Telehealth: Payer: Self-pay | Admitting: *Deleted

## 2014-06-06 ENCOUNTER — Telehealth: Payer: Self-pay | Admitting: Neurology

## 2014-06-06 NOTE — Telephone Encounter (Signed)
Patient called back and r/s appointment for today to 06/13/14 at 10 am due to transportation.

## 2014-06-06 NOTE — Telephone Encounter (Signed)
Patient calling in to verify appointment time, informed patient that she is to be here by 11:15 am.

## 2014-06-06 NOTE — Telephone Encounter (Signed)
This patient did not show for a new patient appointment today. She called within an hour of employment, indicating she had no transportation.

## 2014-06-13 ENCOUNTER — Ambulatory Visit: Payer: Self-pay | Admitting: Neurology

## 2014-06-13 ENCOUNTER — Encounter: Payer: Self-pay | Admitting: Neurology

## 2014-06-13 ENCOUNTER — Ambulatory Visit (INDEPENDENT_AMBULATORY_CARE_PROVIDER_SITE_OTHER): Payer: Medicaid Other | Admitting: Neurology

## 2014-06-13 VITALS — BP 114/71 | HR 72 | Ht 61.0 in | Wt 110.8 lb

## 2014-06-13 DIAGNOSIS — G2401 Drug induced subacute dyskinesia: Secondary | ICD-10-CM

## 2014-06-13 DIAGNOSIS — F952 Tourette's disorder: Secondary | ICD-10-CM

## 2014-06-13 DIAGNOSIS — G219 Secondary parkinsonism, unspecified: Secondary | ICD-10-CM | POA: Insufficient documentation

## 2014-06-13 DIAGNOSIS — F2089 Other schizophrenia: Secondary | ICD-10-CM

## 2014-06-13 HISTORY — DX: Secondary parkinsonism, unspecified: G21.9

## 2014-06-13 HISTORY — DX: Drug induced subacute dyskinesia: G24.01

## 2014-06-13 MED ORDER — BENZTROPINE MESYLATE 0.5 MG PO TABS: 0.5000 mg | ORAL_TABLET | Freq: Two times a day (BID) | ORAL | Status: DC

## 2014-06-13 NOTE — Progress Notes (Signed)
Reason for visit: Gait disorder  Tamara Briggs is a 28 y.o. female  History of present illness:  Tamara Briggs is a 28 year old right-handed black female with a history of HIV infection, polysubstance abuse, and schizophrenia. The patient has been on Latuda for approximately 15 months. The patient seemed to do fairly well initially, but over the last 4 months, she has developed a significant change in her ability to ambulate. The patient has noted stiffness and she reports numbness of the left lower extremity. The patient has also developed a tremor that has affected the left upper extremity. The patient has not had any falls, but she has difficulty ambulating longer distances, as the left leg will seem to lock up, and she may freeze with walking. The patient has had a change in facial expression, and she has developed involuntary movements of the lips and tongue. The patient will bite her tongue on occasion. The patient denies any significant issues with the right arm and leg. She apparently had a change in her clinical situation shortly after a C-section was done. She is followed through psychiatry, with [facility] psychiatry. The patient has remained on the current dose of the Latuda, without change in medication dosing. The patient comes to this office for further evaluation.  Past Medical History  Diagnosis Date  . Crack cocaine use   . Anxiety   . Depression   . Schizophrenia   . Bipolar disorder   . 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 Otitis media of left ear 03/15/2013  . Infection   . 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 Tardive dyskinesia 06/13/2014    Past Surgical History  Procedure Laterality Date  . Colposcopy  2009  . Cesarean section N/A 12/24/2013    Procedure: CESAREAN SECTION;  Surgeon: Lesly Dukes, MD;  Location: [facility];  Service: Obstetrics;  Laterality: N/A;  . Dental restoration/extraction with x-ray      Family History  Problem Relation Age of Onset  . Diabetes Mother     . High blood pressure Mother   . Cancer Father     Social history:  reports that she has been smoking Cigarettes.  She has a .3 pack-year smoking history. She has never used smokeless tobacco. She reports that she does not drink alcohol or use illicit drugs.  Medications:  Current Outpatient Prescriptions on File Prior to Visit  Medication Sig Dispense Refill  . acetaminophen (TYLENOL) 325 MG tablet Take 1 tablet (325 mg total) by mouth every 6 (six) hours as needed.  21 tablet  0  . atazanavir-cobicistat (EVOTAZ) 300-150 MG per tablet Take 1 tablet by mouth daily. Swallow whole. Do NOT crush, cut or chew tablet. Take with food.      Marland Kitchen emtricitabine-tenofovir (TRUVADA) 200-300 MG per tablet Take 1 tablet by mouth daily.  30 tablet  11  . ENSURE (ENSURE) Take 237 mLs by mouth 2 (two) times daily between meals.  237 mL  11  . ibuprofen (ADVIL,MOTRIN) 600 MG tablet Take 600 mg by mouth 2 (two) times daily as needed for moderate pain.      . Lurasidone HCl (LATUDA) 20 MG TABS Take 20 mg by mouth at bedtime.       . methocarbamol (ROBAXIN) 500 MG tablet Take 1 tablet (500 mg total) by mouth 2 (two) times daily.  20 tablet  0  . sertraline (ZOLOFT) 50 MG tablet Take 1 tablet (50 mg total) by mouth daily.  30 tablet  3  .  traMADol (ULTRAM) 50 MG tablet Take 50 mg by mouth every 12 (twelve) hours as needed for moderate pain.       No current facility-administered medications on file prior to visit.     No Known Allergies  ROS:  Out of a complete 14 system review of symptoms, the patient complains only of the following symptoms, and all other reviewed systems are negative.  Weight gain Blurred vision Shortness of breath Joint pain, achy muscles Confusion, weakness Anxiety Restless legs  Blood pressure 114/71, pulse 72, height 5\' 1"  (1.549 m), weight 110 lb 12.8 oz (50.259 kg), last menstrual period 05/01/2014, not currently breastfeeding.  Physical Exam  General: The patient is alert  and cooperative at the time of the examination.  Eyes: Pupils are equal, round, and reactive to light. Discs are flat bilaterally.  Neck: The neck is supple, no carotid bruits are noted.  Respiratory: The respiratory examination is clear.  Cardiovascular: The cardiovascular examination reveals a regular rate and rhythm, no obvious murmurs or rubs are noted.  Skin: Extremities are without significant edema.  Neurologic Exam  Mental status: The patient is alert and oriented x 3 at the time of the examination. The patient has apparent normal recent and remote memory, with an apparently normal attention span and concentration ability.  Cranial nerves: Facial symmetry is present. There is good sensation of the face to pinprick and soft touch bilaterally. The strength of the facial muscles and the muscles to head turning and shoulder shrug are normal bilaterally. Speech is well enunciated, no aphasia or dysarthria is noted. Extraocular movements are full. Visual fields are full. The tongue is midline, and the patient has symmetric elevation of the soft palate. No obvious hearing deficits are noted. The patient has masked facies.  Motor: The motor testing reveals 5 over 5 strength of all 4 extremities. Good symmetric motor tone is noted throughout.  Sensory: Sensory testing is intact to pinprick, soft touch, vibration sensation, and position sense on all 4 extremities. No evidence of extinction is noted.  Coordination: Cerebellar testing reveals good finger-nose-finger and heel-to-shin bilaterally. The patient tends to hold the left arm in flexion, a tremor seen intermittently.  Gait and station: Gait is slow, deliberate, the patient holds the left arm in flexion with walking. The patient will freeze on occasion. Tandem gait is slightly unsteady. Romberg is negative. No drift is seen.  Reflexes: Deep tendon reflexes are symmetric and normal bilaterally. Toes are downgoing  bilaterally.   Assessment/Plan:  1. Schizophrenia   2. Gait disorder, secondary parkinsonism  3. Tardive dyskinesia  4. Polysubstance abuse, cocaine and marijuana  The patient has developed a significant change in her ability to ambulate. She now has a masked facies, and a significant gait disorder with tremor involving the left arm, and dysfunction of the left leg greater than the right side. 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 The patient will be placed on Cogentin at this time. The patient was sent for MRI evaluation of the brain given the history of HIV infection and cocaine abuse. The patient indicates that she last used cocaine 4 or 5 months ago. The patient will followup in about 3 months. She will need to see her psychiatrist in the near future regarding treatment of her schizophrenia. If she can be placed on Seroquel or Clozaril, this may help minimize her secondary parkinsonism symptoms.   C.  Lesia SagoKeith Chanse Kagel MD 06/13/2014 8:05 PM  [facility] [address] [place], [place] [id number]  Phone [phone] Fax [phone]

## 2014-06-13 NOTE — Patient Instructions (Signed)
Tardive Dyskinesia Tardive dyskinesia is a neurological syndrome. It is caused by the long-term use of neuroleptic drugs. Those drugs are generally prescribed for psychiatric disorders, as well as for some gastrointestinal and neurological disorders. This syndrome causes repetitive, involuntary, purposeless movements. SYMPTOMS   Grimacing.  Tongue protrusion.  Lip smacking, puckering, and pursing.  Rapid eye blinking.  Rapid movements of the arms, legs, and trunk.  Impaired movements of the fingers. It may appear as though the patient is playing an invisible guitar or piano. TREATMENT  There is no standard treatment for this syndrome. Treatment is highly individualized. The first step is generally to stop or minimize the use of the neuroleptic drug. But for patients with a severe underlying condition this may not be a feasible option. Replacing the neuroleptic drug with substitute drugs may help some patients. Other drugs may also be helpful. Examples include:  Benzodiazepines.  Adrenergic antagonists.  Dopamine agonists. Symptoms may last long after the neuroleptic drugs have been stopped. But, with careful management, some symptoms may improve and/or disappear with time. Document Released: 07/31/2002 Document Revised: 11/02/2011 Document Reviewed: 10/13/2013 ExitCare Patient Information 2015 [employer], [employer]. This information is not intended to replace advice given to you by your health care provider. Make sure you discuss any questions you have with your health care provider.  

## 2014-06-25 ENCOUNTER — Encounter: Payer: Self-pay | Admitting: Neurology

## 2014-06-29 ENCOUNTER — Ambulatory Visit
Admission: RE | Admit: 2014-06-29 | Discharge: 2014-06-29 | Disposition: A | Discharge status: Home / Self Care | Payer: Medicaid Other | Source: Ambulatory Visit | Attending: Neurology | Admitting: Neurology

## 2014-06-29 DIAGNOSIS — G2401 Drug induced subacute dyskinesia: Secondary | ICD-10-CM

## 2014-06-29 DIAGNOSIS — F2089 Other schizophrenia: Secondary | ICD-10-CM

## 2014-06-29 DIAGNOSIS — G219 Secondary parkinsonism, unspecified: Secondary | ICD-10-CM

## 2014-06-29 DIAGNOSIS — G20 Parkinson's disease: Secondary | ICD-10-CM

## 2014-06-29 DIAGNOSIS — F952 Tourette's disorder: Secondary | ICD-10-CM

## 2014-07-02 ENCOUNTER — Telehealth: Payer: Self-pay | Admitting: Neurology

## 2014-07-02 NOTE — Telephone Encounter (Signed)
Spoke to patient's case manager, Trinna PostAlex, and he relayed that the patient wants to see Dr. Anne HahnWillis before the end of November because that is when her medicaid will run out.  She wishes to address medication concerns.  The case manager relayed that she is doing much better since she has seen Dr. Anne HahnWillis and was grateful.  Please advise if you would like to see her and I will set up appointment.

## 2014-07-02 NOTE — Telephone Encounter (Signed)
Patient's medical case manager Monia PouchAlex Moseley calling to request sooner appointment for patient to address medication concerns, please return call and advise.

## 2014-07-02 NOTE — Telephone Encounter (Signed)
If possible, we may be able to get this patient worked in sometime before the end of November.

## 2014-07-04 ENCOUNTER — Telehealth: Payer: Self-pay | Admitting: Neurology

## 2014-07-04 NOTE — Telephone Encounter (Signed)
  I called patient, MRI the brain is normal. I discussed this with her.   MRI brain 07/04/2014:  IMPRESSION: Unremarkable MRI scan of the brain without contrast

## 2014-07-09 NOTE — Telephone Encounter (Signed)
Patient calling to ask if she needs to be seen sooner by Dr. Anne HahnWillis, please return call and advise.

## 2014-07-12 NOTE — Telephone Encounter (Signed)
Left message to call office and confirm that she can come for an appointment on November 30 at 12 noon.

## 2014-07-18 ENCOUNTER — Other Ambulatory Visit: Payer: Self-pay | Admitting: *Deleted

## 2014-07-18 DIAGNOSIS — B20 Human immunodeficiency virus [HIV] disease: Secondary | ICD-10-CM

## 2014-07-18 MED ORDER — EMTRICITABINE-TENOFOVIR DF 200-300 MG PO TABS: 1.0000 | ORAL_TABLET | Freq: Every day | ORAL | Status: DC

## 2014-07-18 MED ORDER — ATAZANAVIR-COBICISTAT 300-150 MG PO TABS: 1.0000 | ORAL_TABLET | Freq: Every day | ORAL | Status: DC

## 2014-07-18 NOTE — Telephone Encounter (Signed)
Adap Application

## 2014-07-20 ENCOUNTER — Encounter (HOSPITAL_COMMUNITY): Payer: Self-pay | Admitting: Emergency Medicine

## 2014-07-20 ENCOUNTER — Emergency Department (HOSPITAL_COMMUNITY): Payer: Medicaid Other

## 2014-07-20 ENCOUNTER — Emergency Department (HOSPITAL_COMMUNITY)
Admission: EM | Admit: 2014-07-20 | Discharge: 2014-07-20 | Disposition: A | Discharge status: Home / Self Care | Payer: Medicaid Other | Attending: Emergency Medicine | Admitting: Emergency Medicine

## 2014-07-20 DIAGNOSIS — Z720 Tobacco use: Secondary | ICD-10-CM | POA: Diagnosis not present

## 2014-07-20 DIAGNOSIS — F418 Other specified anxiety disorders: Secondary | ICD-10-CM | POA: Diagnosis not present

## 2014-07-20 DIAGNOSIS — Z79899 Other long term (current) drug therapy: Secondary | ICD-10-CM | POA: Insufficient documentation

## 2014-07-20 DIAGNOSIS — F319 Bipolar disorder, unspecified: Secondary | ICD-10-CM | POA: Diagnosis not present

## 2014-07-20 DIAGNOSIS — G219 Secondary parkinsonism, unspecified: Secondary | ICD-10-CM | POA: Diagnosis not present

## 2014-07-20 DIAGNOSIS — N926 Irregular menstruation, unspecified: Secondary | ICD-10-CM | POA: Diagnosis present

## 2014-07-20 DIAGNOSIS — B20 Human immunodeficiency virus [HIV] disease: Secondary | ICD-10-CM | POA: Diagnosis not present

## 2014-07-20 DIAGNOSIS — R140 Abdominal distension (gaseous): Secondary | ICD-10-CM

## 2014-07-20 DIAGNOSIS — F209 Schizophrenia, unspecified: Secondary | ICD-10-CM | POA: Insufficient documentation

## 2014-07-20 LAB — URINALYSIS, ROUTINE W REFLEX MICROSCOPIC
Bilirubin Urine: NEGATIVE
Glucose, UA: NEGATIVE mg/dL
Hgb urine dipstick: NEGATIVE
KETONES UR: NEGATIVE mg/dL
Leukocytes, UA: NEGATIVE
NITRITE: NEGATIVE
PH: 7 (ref 5.0–8.0)
Protein, ur: NEGATIVE mg/dL
Specific Gravity, Urine: 1.025 (ref 1.005–1.030)
Urobilinogen, UA: 0.2 mg/dL (ref 0.0–1.0)

## 2014-07-20 LAB — WET PREP, GENITAL
Clue Cells Wet Prep HPF POC: NONE SEEN
Trich, Wet Prep: NONE SEEN
YEAST WET PREP: NONE SEEN

## 2014-07-20 LAB — CBC WITH DIFFERENTIAL/PLATELET
BASOS ABS: 0 10*3/uL (ref 0.0–0.1)
Basophils Relative: 0 % (ref 0–1)
Eosinophils Absolute: 0.1 10*3/uL (ref 0.0–0.7)
Eosinophils Relative: 2 % (ref 0–5)
HCT: 36.9 % (ref 36.0–46.0)
Hemoglobin: 12.2 g/dL (ref 12.0–15.0)
LYMPHS PCT: 45 % (ref 12–46)
Lymphs Abs: 2.1 10*3/uL (ref 0.7–4.0)
MCH: 28.3 pg (ref 26.0–34.0)
MCHC: 33.1 g/dL (ref 30.0–36.0)
MCV: 85.6 fL (ref 78.0–100.0)
Monocytes Absolute: 0.5 10*3/uL (ref 0.1–1.0)
Monocytes Relative: 11 % (ref 3–12)
NEUTROS ABS: 2 10*3/uL (ref 1.7–7.7)
Neutrophils Relative %: 42 % — ABNORMAL LOW (ref 43–77)
PLATELETS: 230 10*3/uL (ref 150–400)
RBC: 4.31 MIL/uL (ref 3.87–5.11)
RDW: 14.3 % (ref 11.5–15.5)
WBC: 4.7 10*3/uL (ref 4.0–10.5)

## 2014-07-20 LAB — COMPREHENSIVE METABOLIC PANEL
ALBUMIN: 3.7 g/dL (ref 3.5–5.2)
ALT: 15 U/L (ref 0–35)
AST: 25 U/L (ref 0–37)
Alkaline Phosphatase: 98 U/L (ref 39–117)
Anion gap: 11 (ref 5–15)
BILIRUBIN TOTAL: 0.9 mg/dL (ref 0.3–1.2)
BUN: 15 mg/dL (ref 6–23)
CALCIUM: 8.8 mg/dL (ref 8.4–10.5)
CHLORIDE: 104 meq/L (ref 96–112)
CO2: 25 mEq/L (ref 19–32)
Creatinine, Ser: 0.93 mg/dL (ref 0.50–1.10)
GFR calc Af Amer: >90 mL/min (ref >90)
GFR, EST NON AFRICAN AMERICAN: 82 mL/min — AB (ref >90)
Glucose, Bld: 100 mg/dL — ABNORMAL HIGH (ref 70–99)
Potassium: 4.3 mEq/L (ref 3.7–5.3)
Sodium: 140 mEq/L (ref 137–147)
Total Protein: 8.4 g/dL — ABNORMAL HIGH (ref 6.0–8.3)

## 2014-07-20 LAB — POC URINE PREG, ED: PREG TEST UR: NEGATIVE

## 2014-07-20 NOTE — ED Notes (Signed)
Pt states her abdomin is gradually expanding and she is worried that she may be pregnant.

## 2014-07-20 NOTE — ED Notes (Signed)
Pt. Stated, I've not had a period and I've had a Depo shot 06-13-2014, so I m not sure whats going on.

## 2014-07-20 NOTE — ED Notes (Signed)
Returned from from ultra sound

## 2014-07-20 NOTE — ED Notes (Signed)
Transported to ultrasound

## 2014-07-20 NOTE — Discharge Instructions (Signed)
Read the information below.  You may return to the Emergency Department at any time for worsening condition or any new symptoms that concern you.  If you develop high fevers, abdominal pain, uncontrolled vomiting, or are unable to tolerate fluids by mouth, return to the ER for a recheck.     Bloating Bloating is the feeling of fullness in your belly. You may feel as though your pants are too tight. Often the cause of bloating is overeating, retaining fluids, or having gas in your bowel. It is also caused by swallowing air and eating foods that cause gas. Irritable bowel syndrome is one of the most common causes of bloating. Constipation is also a common cause. Sometimes more serious problems can cause bloating. SYMPTOMS  Usually there is a feeling of fullness, as though your abdomen is bulged out. There may be mild discomfort.  DIAGNOSIS  Usually no particular testing is necessary for most bloating. If the condition persists and seems to become worse, your caregiver may do additional testing.  TREATMENT   There is no direct treatment for bloating.  Do not put gas into the bowel. Avoid chewing gum and sucking on candy. These tend to make you swallow air. Swallowing air can also be a nervous habit. Try to avoid this.  Avoiding high residue diets will help. Eat foods with soluble fibers (examples include root vegetables, apples, or barley) and substitute dairy products with soy and rice products. This helps irritable bowel syndrome.  If constipation is the cause, then a high residue diet with more fiber will help.  Avoid carbonated beverages.  Over-the-counter preparations are available that help reduce gas. Your pharmacist can help you with this. SEEK MEDICAL CARE IF:   Bloating continues and seems to be getting worse.  You notice a weight gain.  You have a weight loss but the bloating is getting worse.  You have changes in your bowel habits or develop nausea or vomiting. SEEK IMMEDIATE  MEDICAL CARE IF:   You develop shortness of breath or swelling in your legs.  You have an increase in abdominal pain or develop chest pain. Document Released: 06/10/2006 Document Revised: 11/02/2011 Document Reviewed: 07/29/2007 [facility] Patient Information 2015 [employer] [place]. This information is not intended to replace advice given to you by your health care provider. Make sure you discuss any questions you have with your health care provider.

## 2014-07-20 NOTE — ED Provider Notes (Signed)
CSN: [id number]     Arrival date & time 07/20/14  1216 History   First MD Initiated Contact with Patient 07/20/14 1447     Chief Complaint  Patient presents with  . Menstrual Problem     (Consider location/radiation/quality/duration/timing/severity/associated sxs/prior Treatment) HPI   Pt with hx HIV, last CD4 430, schizophrenia, polysubstance abuse, p/w abdominal swelling, weight gain, no period since September.  She had depo provera injection 06/13/14 but states she normally has monthly vaginal spotting with this.  States she has gained 30 lbs in a year, but also had a c-section in May and has had the abdominal swelling since then. Denies fevers, abdominal pain, N/V/D, urinary symptoms, abnormal vaginal discharge, change in bowel habits.    Past Medical History  Diagnosis Date  . Crack cocaine use   . Anxiety   . Depression   . Schizophrenia   . Bipolar disorder   . 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 Otitis media of left ear 03/15/2013  . Infection   . 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 Tardive dyskinesia 06/13/2014   Past Surgical History  Procedure Laterality Date  . Colposcopy  2009  . Cesarean section N/A 12/24/2013    Procedure: CESAREAN SECTION;  Surgeon: Lesly Dukes, MD;  Location: [facility];  Service: Obstetrics;  Laterality: N/A;  . Dental restoration/extraction with x-ray     Family History  Problem Relation Age of Onset  . Diabetes Mother   . High blood pressure Mother   . Cancer Father    History  Substance Use Topics  . Smoking status: Current Every Day Smoker -- 0.10 packs/day for 3 years    Types: Cigarettes  . Smokeless tobacco: Never Used     Comment: cutting back  . Alcohol Use: No   OB History    Gravida Para Term Preterm AB TAB SAB Ectopic Multiple Living   3 2 1 1 1  0 1 0 0 2     Review of Systems  All other systems reviewed and are negative.     Allergies  Review of patient's allergies indicates no known allergies.  Home Medications    Prior to Admission medications   Medication Sig Start Date End Date Taking? Authorizing Provider  acetaminophen (TYLENOL) 325 MG tablet Take 1 tablet (325 mg total) by mouth every 6 (six) hours as needed. 06/01/14   Jennifer L Piepenbrink, PA-C  atazanavir-cobicistat (EVOTAZ) 300-150 MG per tablet Take 1 tablet by mouth daily. Swallow whole. Do NOT crush, cut or chew tablet. Take with food. 07/18/14   Randall Hiss, MD  benztropine (COGENTIN) 0.5 MG tablet Take 1 tablet (0.5 mg total) by mouth 2 (two) times daily. 06/13/14   York Spaniel, MD  emtricitabine-tenofovir (TRUVADA) 200-300 MG per tablet Take 1 tablet by mouth daily. 07/18/14   Randall Hiss, MD  ENSURE (ENSURE) Take 237 mLs by mouth 2 (two) times daily between meals. 05/22/14   Judyann Munson, MD  ibuprofen (ADVIL,MOTRIN) 600 MG tablet Take 600 mg by mouth 2 (two) times daily as needed for moderate pain.    Historical Provider, MD  Lurasidone HCl (LATUDA) 20 MG TABS Take 20 mg by mouth at bedtime.     Historical Provider, MD  methocarbamol (ROBAXIN) 500 MG tablet Take 1 tablet (500 mg total) by mouth 2 (two) times daily. 06/01/14   Jennifer L Piepenbrink, PA-C  sertraline (ZOLOFT) 50 MG tablet Take 1 tablet (50 mg total) by mouth daily. 04/12/14  Judyann Munsonynthia Snider, MD  traMADol (ULTRAM) 50 MG tablet Take 50 mg by mouth every 12 (twelve) hours as needed for moderate pain. 05/22/14   Judyann Munsonynthia Snider, MD   BP 108/62 mmHg  Pulse 88  Temp(Src) 97.9 F (36.6 C) (Oral)  Resp 16  Ht 5\' 4"  (1.626 m)  Wt 111 lb (50.349 kg)  BMI 19.04 kg/m2  SpO2 98%  LMP 03/31/2014 Physical Exam  Constitutional: She appears well-developed and well-nourished. No distress.  HENT:  Head: Normocephalic and atraumatic.  Neck: Neck supple.  Cardiovascular: Normal rate and regular rhythm.   Pulmonary/Chest: Effort normal and breath sounds normal. No respiratory distress. She has no wheezes. She has no rales.  Abdominal: Soft. She exhibits  distension. She exhibits no fluid wave and no mass. There is no tenderness. There is no rebound and no guarding.  Genitourinary: No bleeding in the vagina.  Thick white malodorous discharge.  Tenderness throughout on bimanual.    Neurological: She is alert.  Skin: She is not diaphoretic.  Nursing note and vitals reviewed.   ED Course  Procedures (including critical care time) Labs Review Labs Reviewed  WET PREP, GENITAL - Abnormal; Notable for the following:    WBC, Wet Prep HPF POC FEW (*)    All other components within normal limits  URINALYSIS, ROUTINE W REFLEX MICROSCOPIC - Abnormal; Notable for the following:    APPearance CLOUDY (*)    All other components within normal limits  COMPREHENSIVE METABOLIC PANEL - Abnormal; Notable for the following:    Glucose, Bld 100 (*)    Total Protein 8.4 (*)    GFR calc non Af Amer 82 (*)    All other components within normal limits  CBC WITH DIFFERENTIAL - Abnormal; Notable for the following:    Neutrophils Relative % 42 (*)    All other components within normal limits  GC/CHLAMYDIA PROBE AMP  POC URINE PREG, ED    Imaging Review [place] Pelvis Complete  07/20/2014   CLINICAL DATA:  Distended abdomen.  EXAM: TRANSABDOMINAL ULTRASOUND OF PELVIS  TECHNIQUE: Transabdominal ultrasound examination of the pelvis was performed including evaluation of the uterus, ovaries, adnexal regions, and pelvic cul-de-sac.  COMPARISON:  12/14/2013.  FINDINGS: Uterus  Measurements: 5.8 x 2.6 x 3.3 cm. No fibroids or other mass visualized.  Endometrium  Thickness: 6.4 mm.  No focal abnormality visualized.  Right ovary  Not visualized.  Left ovary  Not visualized.  Other findings: No free fluid. Limited exam, patient refused transvaginal exam.  IMPRESSION: Limited exam, patient refused transvaginal exam. No focal abnormality identified. Ovaries not visualized. No free fluid .   Electronically Signed   By: Maisie Fushomas  Register   On: 07/20/2014 16:59     EKG  Interpretation None       3:09 PM Discussed pt with Dr Ethelda ChickJacubowitz   MDM   Final diagnoses:  Bloating   Afebrile, nontoxic patient with hx HIV with increased abdominal size and missed period (pt on depo provera) also weight gain.  Pt c/o weight gain in the past year but she was pregnant for much of this time.  Exam without tenderness, though pt did have tenderness on pelvic exam and abnormal discharge.  Labs, UA, wet prep unremarkable.  Pelvic US (what patient would allow) unremarkable.  Pt denies any pain, doubt torsion.  No masses palpated and none seen on US.  D/C home with gyn, PCP follow up. Discussed result, findings, treatment, and follow up  with patient.  Pt given return  precautions.  Pt verbalizes understanding and agrees with plan.        Trixie Dredgemily Chrishawn Boley, PA-C 07/20/14 1728  Doug SouSam Jacubowitz, MD 07/20/14 1734

## 2014-07-21 LAB — GC/CHLAMYDIA PROBE AMP
CT Probe RNA: POSITIVE — AB
GC Probe RNA: NEGATIVE

## 2014-07-22 ENCOUNTER — Telehealth: Payer: Self-pay | Admitting: Emergency Medicine

## 2014-07-22 NOTE — Telephone Encounter (Signed)
Positive Chlamydia culture Chart sent to EDP for review 

## 2014-07-23 ENCOUNTER — Ambulatory Visit (INDEPENDENT_AMBULATORY_CARE_PROVIDER_SITE_OTHER): Payer: Medicaid Other | Admitting: Neurology

## 2014-07-23 ENCOUNTER — Encounter: Payer: Self-pay | Admitting: Neurology

## 2014-07-23 VITALS — BP 104/62 | HR 89 | Ht 61.0 in | Wt 115.0 lb

## 2014-07-23 DIAGNOSIS — G219 Secondary parkinsonism, unspecified: Secondary | ICD-10-CM

## 2014-07-23 DIAGNOSIS — G2401 Drug induced subacute dyskinesia: Secondary | ICD-10-CM

## 2014-07-23 NOTE — Patient Instructions (Signed)

## 2014-07-23 NOTE — Progress Notes (Signed)
Reason for visit: Secondary parkinsonism  Tamara Briggs is an 28 y.o. female  History of present illness:  Ms. Tamara Briggs is a 28 year old right-handed black female with a history of schizophrenia on JordanLatuda. The patient has developed a gait disorder and tardive dyskinesia on the medication. She was recently placed on Cogentin which seems to have helped her significantly with the walking. The patient has not normalized, but she is walking with less effort, and she is not falling. The patient still has some tardive dyskinesia movements. The patient has been seen by her psychiatrist, and was felt that they would not change the Latuda medication at this point. The patient returns for an evaluation.  Past Medical History  Diagnosis Date  . Crack cocaine use   . Anxiety   . Depression   . Schizophrenia   . Bipolar disorder   . 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 Otitis media of left ear 03/15/2013  . Infection   . 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 Tardive dyskinesia 06/13/2014    Past Surgical History  Procedure Laterality Date  . Colposcopy  2009  . Cesarean section N/A 12/24/2013    Procedure: CESAREAN SECTION;  Surgeon: Tamara DukesKelly H Leggett, MD;  Location: [facility];  Service: Obstetrics;  Laterality: N/A;  . Dental restoration/extraction with x-ray      Family History  Problem Relation Age of Onset  . Diabetes Mother   . High blood pressure Mother   . Cancer Father     Social history:  reports that she has been smoking Cigarettes.  She has a .3 pack-year smoking history. She has never used smokeless tobacco. She reports that she does not drink alcohol or use illicit drugs.   No Known Allergies  Medications:  Current Outpatient Prescriptions on File Prior to Visit  Medication Sig Dispense Refill  . acetaminophen (TYLENOL) 325 MG tablet Take 1 tablet (325 mg total) by mouth every 6 (six) hours as needed. 21 tablet 0  . atazanavir-cobicistat (EVOTAZ) 300-150 MG per tablet Take 1 tablet by  mouth daily. Swallow whole. Do NOT crush, cut or chew tablet. Take with food. 30 tablet 5  . benztropine (COGENTIN) 0.5 MG tablet Take 1 tablet (0.5 mg total) by mouth 2 (two) times daily. 60 tablet 1  . emtricitabine-tenofovir (TRUVADA) 200-300 MG per tablet Take 1 tablet by mouth daily. 30 tablet 5  . ENSURE (ENSURE) Take 237 mLs by mouth 2 (two) times daily between meals. 237 mL 11  . ibuprofen (ADVIL,MOTRIN) 600 MG tablet Take 600 mg by mouth 2 (two) times daily as needed for moderate pain.    . Lurasidone HCl (LATUDA) 20 MG TABS Take 20 mg by mouth at bedtime.     . sertraline (ZOLOFT) 50 MG tablet Take 1 tablet (50 mg total) by mouth daily. 30 tablet 3   No current facility-administered medications on file prior to visit.    ROS:  Out of a complete 14 system review of symptoms, the patient complains only of the following symptoms, and all other reviewed systems are negative.  Blurred vision Cough, shortness of breath Restless legs, sleepwalking Joint pain, achy muscles, walking difficulty Memory loss, numbness, weakness Depression  Blood pressure 104/62, pulse 89, height 5\' 1"  (1.549 m), weight 115 lb (52.164 kg), last menstrual period 03/31/2014, not currently breastfeeding.  Physical Exam  General: The patient is alert and cooperative at the time of the examination.  Skin: No significant peripheral edema is noted.   Neurologic Exam  Mental status: The patient is oriented x 3.  Cranial nerves: Facial symmetry is present. Speech is normal, no aphasia or dysarthria is noted. Extraocular movements are full. Visual fields are full. Continues movements of the mouth and lips are noted.  Motor: The patient has good strength in all 4 extremities.  Sensory examination: Soft touch sensation is symmetric on the face, arms, and legs.  Coordination: The patient has good finger-nose-finger and heel-to-shin bilaterally.  Gait and station: The patient has a slightly unsteady gait.  Some dystonic movements of the left arm are noted.. Tandem gait is slightly unsteady. Romberg is negative. No drift is seen.  Reflexes: Deep tendon reflexes are symmetric.   Assessment/Plan:  1. Secondary parkinsonism  2. Tardive dyskinesia  The patient is doing better on the Cogentin at this time. In the future, unless the patient has not done well with Seroquel in the past, this medication should be utilized to see if she can gain benefit with the secondary parkinsonism issues and also control her psychiatric disease. At this point, the patient will follow-up in 6 months.  Marlan Palau. Keith Asheley Hellberg MD 07/23/2014 6:59 PM  [facility] [address] [place], [place] [id number]  Phone [phone] Fax [phone]

## 2014-07-24 ENCOUNTER — Telehealth (HOSPITAL_COMMUNITY): Payer: Self-pay

## 2014-07-24 ENCOUNTER — Telehealth (HOSPITAL_BASED_OUTPATIENT_CLINIC_OR_DEPARTMENT_OTHER): Payer: Self-pay | Admitting: Emergency Medicine

## 2014-07-24 NOTE — ED Notes (Signed)
Chart returned from ED. ATiburcio Pea. Harris ordered Azithromycin 1 gram po x 1 for chlamydia.  Will contact pt.

## 2014-07-26 ENCOUNTER — Other Ambulatory Visit: Payer: Self-pay

## 2014-07-26 MED ORDER — BENZTROPINE MESYLATE 0.5 MG PO TABS: 0.5000 mg | ORAL_TABLET | Freq: Two times a day (BID) | ORAL | Status: DC

## 2014-08-06 ENCOUNTER — Other Ambulatory Visit: Payer: Self-pay | Admitting: *Deleted

## 2014-08-06 DIAGNOSIS — B20 Human immunodeficiency virus [HIV] disease: Secondary | ICD-10-CM

## 2014-08-06 MED ORDER — ATAZANAVIR-COBICISTAT 300-150 MG PO TABS: 1.0000 | ORAL_TABLET | Freq: Every day | ORAL | Status: DC

## 2014-08-06 MED ORDER — SERTRALINE HCL 50 MG PO TABS: 50.0000 mg | ORAL_TABLET | Freq: Every day | ORAL | Status: AC

## 2014-08-06 MED ORDER — EMTRICITABINE-TENOFOVIR DF 200-300 MG PO TABS: 1.0000 | ORAL_TABLET | Freq: Every day | ORAL | Status: DC

## 2014-08-13 ENCOUNTER — Other Ambulatory Visit: Payer: Medicaid Other

## 2014-08-22 ENCOUNTER — Ambulatory Visit: Payer: Medicaid Other

## 2014-08-28 ENCOUNTER — Encounter: Payer: Self-pay | Admitting: Internal Medicine

## 2014-08-28 ENCOUNTER — Ambulatory Visit (INDEPENDENT_AMBULATORY_CARE_PROVIDER_SITE_OTHER): Payer: Medicaid Other | Admitting: Internal Medicine

## 2014-08-28 VITALS — BP 109/70 | HR 93 | Temp 97.3°F | Wt 111.0 lb

## 2014-08-28 DIAGNOSIS — Z21 Asymptomatic human immunodeficiency virus [HIV] infection status: Secondary | ICD-10-CM

## 2014-08-28 LAB — CBC WITH DIFFERENTIAL/PLATELET
Basophils Absolute: 0 10*3/uL (ref 0.0–0.1)
Basophils Relative: 0 % (ref 0–1)
EOS ABS: 0.1 10*3/uL (ref 0.0–0.7)
Eosinophils Relative: 3 % (ref 0–5)
HCT: 39.3 % (ref 36.0–46.0)
Hemoglobin: 13 g/dL (ref 12.0–15.0)
Lymphocytes Relative: 46 % (ref 12–46)
Lymphs Abs: 2.2 10*3/uL (ref 0.7–4.0)
MCH: 27.7 pg (ref 26.0–34.0)
MCHC: 33.1 g/dL (ref 30.0–36.0)
MCV: 83.8 fL (ref 78.0–100.0)
MONO ABS: 0.6 10*3/uL (ref 0.1–1.0)
MPV: 9.2 fL (ref 8.6–12.4)
Monocytes Relative: 12 % (ref 3–12)
NEUTROS PCT: 39 % — AB (ref 43–77)
Neutro Abs: 1.8 10*3/uL (ref 1.7–7.7)
PLATELETS: 207 10*3/uL (ref 150–400)
RBC: 4.69 MIL/uL (ref 3.87–5.11)
RDW: 13.8 % (ref 11.5–15.5)
WBC: 4.7 10*3/uL (ref 4.0–10.5)

## 2014-08-28 LAB — COMPLETE METABOLIC PANEL WITH GFR
ALK PHOS: 106 U/L (ref 39–117)
ALT: 24 U/L (ref 0–35)
AST: 23 U/L (ref 0–37)
Albumin: 4.2 g/dL (ref 3.5–5.2)
BUN: 9 mg/dL (ref 6–23)
CO2: 27 meq/L (ref 19–32)
Calcium: 9 mg/dL (ref 8.4–10.5)
Chloride: 103 mEq/L (ref 96–112)
Creat: 0.89 mg/dL (ref 0.50–1.10)
GFR, Est African American: >89 mL/min
GFR, Est Non African American: 88 mL/min
GLUCOSE: 81 mg/dL (ref 70–99)
Potassium: 3.7 mEq/L (ref 3.5–5.3)
SODIUM: 139 meq/L (ref 135–145)
TOTAL PROTEIN: 8.2 g/dL (ref 6.0–8.3)
Total Bilirubin: 1.4 mg/dL — ABNORMAL HIGH (ref 0.2–1.2)

## 2014-08-28 MED ORDER — MEDROXYPROGESTERONE ACETATE 150 MG/ML IM SUSP
150.0000 mg | Freq: Once | INTRAMUSCULAR | Status: AC
  Administered 2014-08-28: 150 mg via INTRAMUSCULAR

## 2014-08-28 NOTE — Progress Notes (Signed)
Patient ID: Tamara Briggs, female   DOB: 1985/12/26, 29 y.o.   MRN: [id number]       Patient ID: Tamara Briggs, female   DOB: 1985-12-20, 29 y.o.   MRN: [id number]  HPI 29 yo f with hiv disease, well controlled. Also schizophrenia followed at [facility]. She is doing well with her hiv medications. She comes to clinic with her boyfriend. Reports doing well. Has no recent drug use and needs letter for her lawyer with drug screen today. No complaints in health.  Outpatient Encounter Prescriptions as of 08/28/2014  Medication Sig  . acetaminophen (TYLENOL) 325 MG tablet Take 1 tablet (325 mg total) by mouth every 6 (six) hours as needed.  Marland Kitchen atazanavir-cobicistat (EVOTAZ) 300-150 MG per tablet Take 1 tablet by mouth daily. Swallow whole. Do NOT crush, cut or chew tablet. Take with food.  . benztropine (COGENTIN) 0.5 MG tablet Take 1 tablet (0.5 mg total) by mouth 2 (two) times daily.  Marland Kitchen emtricitabine-tenofovir (TRUVADA) 200-300 MG per tablet Take 1 tablet by mouth daily.  Marland Kitchen ENSURE (ENSURE) Take 237 mLs by mouth 2 (two) times daily between meals.  Marland Kitchen ibuprofen (ADVIL,MOTRIN) 600 MG tablet Take 600 mg by mouth 2 (two) times daily as needed for moderate pain.  Marland Kitchen LATUDA 40 MG TABS tablet   . Lurasidone HCl (LATUDA) 20 MG TABS Take 20 mg by mouth at bedtime.   . sertraline (ZOLOFT) 50 MG tablet Take 1 tablet (50 mg total) by mouth daily.     Patient Active Problem List   Diagnosis Date Noted  . 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 Tardive dyskinesia 06/13/2014  . S/P C-section 12/25/2013  . Preterm premature rupture of membranes (PPROM) delivered, current hospitalization 12/14/2013  . Normocytic anemia 12/14/2013  . 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 Abnormal findings on antenatal screening 10/27/2013  . Maternal HIV positive complicating pregnancy in second trimester, antepartum 10/18/2013  . Schizophrenia 10/18/2013  . Pyelonephritis 07/21/2013  . Protein-calorie malnutrition, severe  03/16/2013  . Tobacco abuse 03/15/2013  . h/o crack Cocaine abuse 03/15/2013  . h/o oral Thrush 03/15/2013  . HIV (human immunodeficiency virus infection) 07/27/2011  . Chlamydia and Gonorrhea 07/27/2011  . H/O abnormal cervical Papanicolaou smear 07/26/2008     There are no preventive care reminders to display for this patient.   Review of Systems Review of Systems  Constitutional: Negative for fever, chills, diaphoresis, activity change, appetite change, fatigue and unexpected weight change.  HENT: Negative for congestion, sore throat, rhinorrhea, sneezing, trouble swallowing and sinus pressure.  Eyes: Negative for photophobia and visual disturbance.  Respiratory: Negative for cough, chest tightness, shortness of breath, wheezing and stridor.  Cardiovascular: Negative for chest pain, palpitations and leg swelling.  Gastrointestinal: Negative for nausea, vomiting, abdominal pain, diarrhea, constipation, blood in stool, abdominal distention and anal bleeding.  Genitourinary: Negative for dysuria, hematuria, flank pain and difficulty urinating.  Musculoskeletal: Negative for myalgias, back pain, joint swelling, arthralgias and gait problem.  Skin: Negative for color change, pallor, rash and wound.  Neurological: Negative for dizziness, tremors, weakness and light-headedness.  Hematological: Negative for adenopathy. Does not bruise/bleed easily.  Psychiatric/Behavioral: Negative for behavioral problems, confusion, sleep disturbance, dysphoric mood, decreased concentration and agitation.    Physical Exam   BP 109/70 mmHg  Pulse 93  Temp(Src) 97.3 F (36.3 C) (Oral)  Wt 111 lb (50.349 kg) Physical Exam  Constitutional:  oriented to person, place, and time. appears well-developed and well-nourished. No distress.  HENT:  Mouth/Throat: Oropharynx  is clear and moist. No oropharyngeal exudate.  Cardiovascular: Normal rate, regular rhythm and normal heart sounds. Exam reveals no gallop  and no friction rub.  No murmur heard.  Pulmonary/Chest: Effort normal and breath sounds normal. No respiratory distress.  has no wheezes.  Abdominal: Soft. Bowel sounds are normal.  exhibits no distension. There is no tenderness.  Lymphadenopathy: no cervical adenopathy.  Neurological: alert and oriented to person, place, and time.  Skin: Skin is warm and dry. No rash noted. No erythema.  Psychiatric: a normal mood and affect. behavior is normal.   Lab Results  Component Value Date   CD4TCELL 31* 03/06/2014   Lab Results  Component Value Date   CD4TABS 430 03/06/2014   CD4TABS 290* 12/12/2013   CD4TABS 300* 10/26/2013   Lab Results  Component Value Date   HIV1RNAQUANT <20 03/06/2014   Lab Results  Component Value Date   HEPBSAB POS* 07/09/2011   No results found for: RPR  CBC Lab Results  Component Value Date   WBC 4.7 07/20/2014   RBC 4.31 07/20/2014   HGB 12.2 07/20/2014   HCT 36.9 07/20/2014   PLT 230 07/20/2014   MCV 85.6 07/20/2014   MCH 28.3 07/20/2014   MCHC 33.1 07/20/2014   RDW 14.3 07/20/2014   LYMPHSABS 2.1 07/20/2014   MONOABS 0.5 07/20/2014   EOSABS 0.1 07/20/2014   BASOSABS 0.0 07/20/2014   BMET Lab Results  Component Value Date   NA 140 07/20/2014   K 4.3 07/20/2014   CL 104 07/20/2014   CO2 25 07/20/2014   GLUCOSE 100* 07/20/2014   BUN 15 07/20/2014   CREATININE 0.93 07/20/2014   CALCIUM 8.8 07/20/2014   GFRNONAA 82* 07/20/2014   GFRAA >90 07/20/2014     Assessment and Plan  hiv = appears to have good adherence per her report. We will check labs today  Schizophrenia= appears well controlled. No tardive dyskinesia is noted on this visit  Drug screen = per her disability application, she is requesting uds to be [facility] [address] Fax [phone]. With letter  Health maintenance = will give depo today. She denies having sexual intercourse with her current boyfirend who also says the same thing.

## 2014-08-29 LAB — DRUG SCREEN, URINE
Amphetamine Screen, Ur: NEGATIVE
Barbiturate Quant, Ur: NEGATIVE
Benzodiazepines.: NEGATIVE
COCAINE METABOLITES: NEGATIVE
CREATININE, U: 161.87 mg/dL
Marijuana Metabolite: NEGATIVE
Methadone: NEGATIVE
OPIATES: NEGATIVE
PHENCYCLIDINE (PCP): NEGATIVE
PROPOXYPHENE: NEGATIVE

## 2014-08-29 LAB — RPR

## 2014-08-30 ENCOUNTER — Ambulatory Visit: Payer: Medicaid Other

## 2014-08-30 LAB — URINE CYTOLOGY ANCILLARY ONLY
Chlamydia: NEGATIVE
Neisseria Gonorrhea: NEGATIVE

## 2014-08-30 LAB — T-HELPER CELL (CD4) - (RCID CLINIC ONLY)
CD4 % Helper T Cell: 28 % — ABNORMAL LOW (ref 33–55)
CD4 T CELL ABS: 610 /uL (ref 400–2700)

## 2014-08-30 LAB — HIV-1 RNA QUANT-NO REFLEX-BLD
HIV 1 RNA QUANT: 27 {copies}/mL — AB (ref <20)
HIV-1 RNA Quant, Log: 1.43 {Log} — ABNORMAL HIGH (ref <1.30)

## 2014-08-31 ENCOUNTER — Encounter: Payer: Self-pay | Admitting: *Deleted

## 2014-08-31 ENCOUNTER — Telehealth: Payer: Self-pay | Admitting: *Deleted

## 2014-08-31 NOTE — Telephone Encounter (Signed)
Notified patient that letter she requested from Dr. Drue SecondSnider and [phone]. Wendall MolaJacqueline Cockerham CMA

## 2014-09-28 ENCOUNTER — Ambulatory Visit: Payer: Medicaid Other | Admitting: Neurology

## 2014-11-05 ENCOUNTER — Encounter: Payer: Self-pay | Admitting: Licensed Clinical Social Worker

## 2014-11-05 NOTE — Progress Notes (Signed)
Patient ID: Tamara Briggs, female   DOB: 09/26/1985, 29 y.o.   MRN: [id number] [facility] with Monia PouchAlex Zayley Arras. Clients issues are MH and has a history of SA. Client reports she does not currently use any illegal substances. [facility] and is complaint with MH meds. Client is currently living with her "boyfriend" while her disability application is pending.

## 2014-11-27 ENCOUNTER — Ambulatory Visit (INDEPENDENT_AMBULATORY_CARE_PROVIDER_SITE_OTHER): Payer: Self-pay | Admitting: Internal Medicine

## 2014-11-27 ENCOUNTER — Other Ambulatory Visit: Payer: Self-pay | Admitting: *Deleted

## 2014-11-27 VITALS — BP 101/59 | HR 67 | Temp 98.3°F | Wt 106.0 lb

## 2014-11-27 DIAGNOSIS — B20 Human immunodeficiency virus [HIV] disease: Secondary | ICD-10-CM

## 2014-11-27 DIAGNOSIS — F203 Undifferentiated schizophrenia: Secondary | ICD-10-CM

## 2014-11-27 DIAGNOSIS — G2401 Drug induced subacute dyskinesia: Secondary | ICD-10-CM

## 2014-11-27 LAB — CBC WITH DIFFERENTIAL/PLATELET
BASOS ABS: 0 10*3/uL (ref 0.0–0.1)
Basophils Relative: 0 % (ref 0–1)
Eosinophils Absolute: 0.1 10*3/uL (ref 0.0–0.7)
Eosinophils Relative: 2 % (ref 0–5)
HEMATOCRIT: 36.4 % (ref 36.0–46.0)
HEMOGLOBIN: 12.4 g/dL (ref 12.0–15.0)
LYMPHS ABS: 1.5 10*3/uL (ref 0.7–4.0)
LYMPHS PCT: 33 % (ref 12–46)
MCH: 27.7 pg (ref 26.0–34.0)
MCHC: 34.1 g/dL (ref 30.0–36.0)
MCV: 81.3 fL (ref 78.0–100.0)
MONO ABS: 0.3 10*3/uL (ref 0.1–1.0)
MONOS PCT: 7 % (ref 3–12)
MPV: 8.7 fL (ref 8.6–12.4)
Neutro Abs: 2.6 10*3/uL (ref 1.7–7.7)
Neutrophils Relative %: 58 % (ref 43–77)
Platelets: 214 10*3/uL (ref 150–400)
RBC: 4.48 MIL/uL (ref 3.87–5.11)
RDW: 14.3 % (ref 11.5–15.5)
WBC: 4.5 10*3/uL (ref 4.0–10.5)

## 2014-11-27 LAB — COMPLETE METABOLIC PANEL WITH GFR
ALT: 14 U/L (ref 0–35)
AST: 18 U/L (ref 0–37)
Albumin: 4 g/dL (ref 3.5–5.2)
Alkaline Phosphatase: 84 U/L (ref 39–117)
BUN: 12 mg/dL (ref 6–23)
CALCIUM: 8.3 mg/dL — AB (ref 8.4–10.5)
CHLORIDE: 106 meq/L (ref 96–112)
CO2: 24 mEq/L (ref 19–32)
Creat: 0.84 mg/dL (ref 0.50–1.10)
Glucose, Bld: 65 mg/dL — ABNORMAL LOW (ref 70–99)
Potassium: 3.6 mEq/L (ref 3.5–5.3)
Sodium: 138 mEq/L (ref 135–145)
Total Bilirubin: 1.1 mg/dL (ref 0.2–1.2)
Total Protein: 7.4 g/dL (ref 6.0–8.3)

## 2014-11-27 MED ORDER — ATAZANAVIR-COBICISTAT 300-150 MG PO TABS: 1.0000 | ORAL_TABLET | Freq: Every day | ORAL | Status: DC

## 2014-11-27 MED ORDER — EMTRICITABINE-TENOFOVIR DF 200-300 MG PO TABS: 1.0000 | ORAL_TABLET | Freq: Every day | ORAL | Status: DC

## 2014-11-27 NOTE — Progress Notes (Signed)
Patient ID: Tamara Briggs, female   DOB: June 15, 1986, 29 y.o.   MRN: [id number]       Patient ID: Tamara Briggs, female   DOB: [birth year], 29 y.o.   MRN: [id number]  HPI  29yo F with HIV disease, schizophrenia with CD 4 count of 610/VL 25 currently on truvada-evotaz. Continues to do well with her hiv meds. Reapplying for adap. She states that she feels sleepy most days. She is able to tell us what medications she is taking. She states that she has not seen psychiatrist through [facility] in 2 months and they postponed her recent appt until June. No recent illnesses. Does not have other partners, other than [place].  Outpatient Encounter Prescriptions as of 11/27/2014  Medication Sig  . acetaminophen (TYLENOL) 325 MG tablet Take 1 tablet (325 mg total) by mouth every 6 (six) hours as needed.  Marland Kitchen atazanavir-cobicistat (EVOTAZ) 300-150 MG per tablet Take 1 tablet by mouth daily. Swallow whole. Do NOT crush, cut or chew tablet. Take with food.  . benztropine (COGENTIN) 0.5 MG tablet Take 1 tablet (0.5 mg total) by mouth 2 (two) times daily.  Marland Kitchen emtricitabine-tenofovir (TRUVADA) 200-300 MG per tablet Take 1 tablet by mouth daily.  Marland Kitchen ENSURE (ENSURE) Take 237 mLs by mouth 2 (two) times daily between meals.  Marland Kitchen ibuprofen (ADVIL,MOTRIN) 600 MG tablet Take 600 mg by mouth 2 (two) times daily as needed for moderate pain.  Marland Kitchen LATUDA 40 MG TABS tablet   . Lurasidone HCl (LATUDA) 20 MG TABS Take 20 mg by mouth at bedtime.   . sertraline (ZOLOFT) 50 MG tablet Take 1 tablet (50 mg total) by mouth daily.     Patient Active Problem List   Diagnosis Date Noted  . 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 Tardive dyskinesia 06/13/2014  . S/P C-section 12/25/2013  . Preterm premature rupture of membranes (PPROM) delivered, current hospitalization 12/14/2013  . Normocytic anemia 12/14/2013  . 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 Abnormal findings on antenatal screening 10/27/2013  . Maternal HIV positive complicating  pregnancy in second trimester, antepartum 10/18/2013  . Schizophrenia 10/18/2013  . Pyelonephritis 07/21/2013  . Protein-calorie malnutrition, severe 03/16/2013  . Tobacco abuse 03/15/2013  . h/o crack Cocaine abuse 03/15/2013  . h/o oral Thrush 03/15/2013  . HIV (human immunodeficiency virus infection) 07/27/2011  . Chlamydia and Gonorrhea 07/27/2011  . H/O abnormal cervical Papanicolaou smear 07/26/2008     There are no preventive care reminders to display for this patient.   Review of Systems + sleepiness. Other 10 point ros is negative Physical Exam   There were no vitals taken for this visit. Physical Exam  Constitutional:  oriented to person, place, and time. appears well-developed and well-nourished. No distress.  HENT: [place]/AT, PERRLA, no scleral icterus Mouth/Throat: Oropharynx is clear and moist. No oropharyngeal exudate.  Cardiovascular: Normal rate, regular rhythm and normal heart sounds. Exam reveals no gallop and no friction rub.  No murmur heard.  Pulmonary/Chest: Effort normal and breath sounds normal. No respiratory distress.  has no wheezes.  Neck = supple, no nuchal rigidity Abdominal: Soft. Bowel sounds are normal.  exhibits no distension. There is no tenderness.  Lymphadenopathy: no cervical adenopathy. No axillary adenopathy Neurological: alert and oriented to person, place, and time.  Skin: Skin is warm and dry. No rash noted. No erythema.  Psychiatric: mood is flat  Lab Results  Component Value Date   CD4TCELL 28* 08/28/2014   Lab Results  Component Value Date   CD4TABS  610 08/28/2014   CD4TABS 430 03/06/2014   CD4TABS 290* 12/12/2013   Lab Results  Component Value Date   HIV1RNAQUANT 27* 08/28/2014   Lab Results  Component Value Date   HEPBSAB POS* 07/09/2011   No results found for: RPR  CBC Lab Results  Component Value Date   WBC 4.7 08/28/2014   RBC 4.69 08/28/2014   HGB 13.0 08/28/2014   HCT 39.3 08/28/2014   PLT 207 08/28/2014    MCV 83.8 08/28/2014   MCH 27.7 08/28/2014   MCHC 33.1 08/28/2014   RDW 13.8 08/28/2014   LYMPHSABS 2.2 08/28/2014   MONOABS 0.6 08/28/2014   EOSABS 0.1 08/28/2014   BASOSABS 0.0 08/28/2014   BMET Lab Results  Component Value Date   NA 139 08/28/2014   K 3.7 08/28/2014   CL 103 08/28/2014   CO2 27 08/28/2014   GLUCOSE 81 08/28/2014   BUN 9 08/28/2014   CREATININE 0.89 08/28/2014   CALCIUM 9.0 08/28/2014   GFRNONAA 88 08/28/2014   GFRAA >89 08/28/2014     Assessment and Plan   solomnence = concern side effect of latuda, may need to decrease dosing. Need to [facility] at [facility] and have them see her soon to dose adjust  tarvide dyskinesia = improved, less lip smacking, but still has some tremelousness in hands  hiv disease = will check labs, continue on current regimen

## 2014-11-28 LAB — HIV-1 RNA QUANT-NO REFLEX-BLD
HIV 1 RNA Quant: 63 copies/mL — ABNORMAL HIGH (ref <20)
HIV-1 RNA Quant, Log: 1.8 {Log} — ABNORMAL HIGH (ref <1.30)

## 2014-11-28 LAB — T-HELPER CELL (CD4) - (RCID CLINIC ONLY)
CD4 % Helper T Cell: 31 % — ABNORMAL LOW (ref 33–55)
CD4 T Cell Abs: 460 /uL (ref 400–2700)

## 2015-01-02 ENCOUNTER — Telehealth: Payer: Self-pay | Admitting: *Deleted

## 2015-01-02 ENCOUNTER — Ambulatory Visit: Payer: Self-pay

## 2015-01-02 NOTE — Telephone Encounter (Signed)
Per Dr Drue SecondSnider called the patient to get her to come to clinic today. The patient advised she can come in at 3 pm. Reminded the patient is is a work in and may have to wait.

## 2015-01-02 NOTE — Telephone Encounter (Signed)
Patient called c/o mouth sores (tongue and lip). Feels it is from the HIV meds, which she has been on for at least 2 years. She thinks it is an allergic reaction. She states the sores come and go. She is also requesting an STD check. I asked if she has had unprotected sex and she said no; but she wants to get to the bottom of these sores. She is requesting an appointment, so the MD can examine these open sores. Please advise.

## 2015-01-08 ENCOUNTER — Ambulatory Visit (INDEPENDENT_AMBULATORY_CARE_PROVIDER_SITE_OTHER): Payer: Self-pay | Admitting: Internal Medicine

## 2015-01-08 ENCOUNTER — Encounter: Payer: Self-pay | Admitting: Internal Medicine

## 2015-01-08 VITALS — BP 100/59 | HR 64 | Temp 98.3°F | Wt 109.0 lb

## 2015-01-08 DIAGNOSIS — M2392 Unspecified internal derangement of left knee: Secondary | ICD-10-CM

## 2015-01-08 DIAGNOSIS — G2401 Drug induced subacute dyskinesia: Secondary | ICD-10-CM

## 2015-01-08 DIAGNOSIS — K120 Recurrent oral aphthae: Secondary | ICD-10-CM

## 2015-01-08 DIAGNOSIS — B20 Human immunodeficiency virus [HIV] disease: Secondary | ICD-10-CM

## 2015-01-08 MED ORDER — MAGIC MOUTHWASH W/LIDOCAINE
5.0000 mL | Freq: Three times a day (TID) | ORAL | Status: DC | PRN
Start: 2015-01-08 — End: 2015-07-09

## 2015-01-08 NOTE — Progress Notes (Signed)
Patient ID: Tamara Briggs, female   DOB: 03/27/1986, 29 y.o.   MRN: [id number]       Patient ID: Tamara Briggs, female   DOB: 04/08/1986, 29 y.o.   MRN: [id number]  HPI 29yo F with well controlled HIV disease, schizophrenia but having tardive dyskinesia due to side effects of medications. She states that she recently had oral sores to sides of tongue that are infrequent, painful, self-limited, now improved. She also noticed her left knee frequently locks up causing her to have frequent falls. She remains to have good adherence to hiv meds. No other recent illnesses. She is somewhat aware of her involuntary movements of hands and legs  Outpatient Encounter Prescriptions as of 01/08/2015  Medication Sig  . acetaminophen (TYLENOL) 325 MG tablet Take 1 tablet (325 mg total) by mouth every 6 (six) hours as needed.  Marland Kitchen. atazanavir-cobicistat (EVOTAZ) 300-150 MG per tablet Take 1 tablet by mouth daily. Swallow whole. Do NOT crush, cut or chew tablet. Take with food.  . benztropine (COGENTIN) 0.5 MG tablet Take 1 tablet (0.5 mg total) by mouth 2 (two) times daily.  Marland Kitchen. emtricitabine-tenofovir (TRUVADA) 200-300 MG per tablet Take 1 tablet by mouth daily.  Marland Kitchen. ENSURE (ENSURE) Take 237 mLs by mouth 2 (two) times daily between meals.  Marland Kitchen. ibuprofen (ADVIL,MOTRIN) 600 MG tablet Take 600 mg by mouth 2 (two) times daily as needed for moderate pain.  Marland Kitchen. LATUDA 40 MG TABS tablet   . Lurasidone HCl (LATUDA) 20 MG TABS Take 20 mg by mouth at bedtime.   . sertraline (ZOLOFT) 50 MG tablet Take 1 tablet (50 mg total) by mouth daily.   No facility-administered encounter medications on file as of 01/08/2015.     Patient Active Problem List   Diagnosis Date Noted  . 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 Tardive dyskinesia 06/13/2014  . S/P C-section 12/25/2013  . Preterm premature rupture of membranes (PPROM) delivered, current hospitalization 12/14/2013  . Normocytic anemia 12/14/2013  . History of neural tube defect  11/16/2013  . Abnormal findings on antenatal screening 10/27/2013  . Maternal HIV positive complicating pregnancy in second trimester, antepartum 10/18/2013  . Schizophrenia 10/18/2013  . Pyelonephritis 07/21/2013  . Protein-calorie malnutrition, severe 03/16/2013  . Tobacco abuse 03/15/2013  . h/o crack Cocaine abuse 03/15/2013  . h/o oral Thrush 03/15/2013  . HIV (human immunodeficiency virus infection) 07/27/2011  . Chlamydia and Gonorrhea 07/27/2011  . H/O abnormal cervical Papanicolaou smear 07/26/2008     There are no preventive care reminders to display for this patient.   Review of Systems + mouth ulcers last week, knee pain/frequent falls. 10 point ros is negative Physical Exam   BP 100/59 mmHg  Pulse 64  Temp(Src) 98.3 F (36.8 C) (Oral)  Wt 109 lb (49.442 kg) Physical Exam  Constitutional:  oriented to person, place, and time. appears well-developed and well-nourished. No distress. Chronically ill HENT: [place]/AT, PERRLA, no scleral icterus. No oral sores noted Mouth/Throat: Oropharynx is clear and moist. No oropharyngeal exudate.  Cardiovascular: Normal rate, regular rhythm and normal heart sounds. Exam reveals no gallop and no friction rub.  No murmur heard.  Pulmonary/Chest: Effort normal and breath sounds normal. No respiratory distress.  has no wheezes.  Neck = supple, no nuchal rigidity Abdominal: Soft. Bowel sounds are normal.  exhibits no distension. There is no tenderness.  Lymphadenopathy: no cervical adenopathy. No axillary adenopathy Neurological: alert and oriented to person, place, and time. Involuntary course movement of hands/arms and legs c/w tardive dyskinesia  Skin: Skin is warm and dry. No rash noted. No erythema.  msk = full range of motion with occasional crepitus to left knee. No effusion or pain with ambulatoin Psychiatric: a normal mood and affect.  behavior is normal.   Lab Results  Component Value Date   CD4TCELL 31* 11/27/2014   Lab  Results  Component Value Date   CD4TABS 460 11/27/2014   CD4TABS 610 08/28/2014   CD4TABS 430 03/06/2014   Lab Results  Component Value Date   HIV1RNAQUANT 63* 11/27/2014   Lab Results  Component Value Date   HEPBSAB POS* 07/09/2011   No results found for: RPR  CBC Lab Results  Component Value Date   WBC 4.5 11/27/2014   RBC 4.48 11/27/2014   HGB 12.4 11/27/2014   HCT 36.4 11/27/2014   PLT 214 11/27/2014   MCV 81.3 11/27/2014   MCH 27.7 11/27/2014   MCHC 34.1 11/27/2014   RDW 14.3 11/27/2014   LYMPHSABS 1.5 11/27/2014   MONOABS 0.3 11/27/2014   EOSABS 0.1 11/27/2014   BASOSABS 0.0 11/27/2014   BMET Lab Results  Component Value Date   NA 138 11/27/2014   K 3.6 11/27/2014   CL 106 11/27/2014   CO2 24 11/27/2014   GLUCOSE 65* 11/27/2014   BUN 12 11/27/2014   CREATININE 0.84 11/27/2014   CALCIUM 8.3* 11/27/2014   GFRNONAA >89 11/27/2014   GFRAA >89 11/27/2014     Assessment and Plan   Hx of aphous ulcers = will give magic mouthwash to use prn  hiv disease = well controlled, continue on current regimen.  Locked knee = will refer to orthopedics to evaluate to meniscal injury. Will give her rx for cane for gait support  Drug induced Tardive dyskinesia = will have her case worker get back to [facility] to do dose adjustment of meds. Also have her establish care with home visit RN through clinic to see how to facilitate access to care. She is currently on benzoprine to help with symptoms, but her truncal dyskinesia appears worse to me today at this visit. [facility] to see if she can be seen sooner

## 2015-01-11 ENCOUNTER — Other Ambulatory Visit: Payer: Self-pay | Admitting: *Deleted

## 2015-01-11 DIAGNOSIS — B20 Human immunodeficiency virus [HIV] disease: Secondary | ICD-10-CM

## 2015-01-14 ENCOUNTER — Telehealth: Payer: Self-pay | Admitting: *Deleted

## 2015-01-14 NOTE — Telephone Encounter (Signed)
Contacted the patient and offered assistance with managing her medications. Pt stated she has been having trouble with getting her medications since she does not have medicaid. Pt stated her [facility] case manager, Trinna PostAlex, has been working on it but has not been able to get a return call back from the MCD worker. RN scheduled visit for tomorrow and the patient asked if I would also tell her boyfriend the reason for my call. Spoke with the patient's boyfriend and updated him on the reason for my visit. He stated he does not understand the system because the patient needs a cane since her knee continues to lock up and and no one will help her. Offered understanding and advised him that I look forward to meeting them tomorrow

## 2015-01-15 ENCOUNTER — Ambulatory Visit: Payer: Self-pay | Admitting: *Deleted

## 2015-01-15 VITALS — BP 112/64 | HR 76 | Temp 97.3°F | Resp 18

## 2015-01-15 DIAGNOSIS — B20 Human immunodeficiency virus [HIV] disease: Secondary | ICD-10-CM

## 2015-01-15 DIAGNOSIS — G2401 Drug induced subacute dyskinesia: Secondary | ICD-10-CM

## 2015-01-18 ENCOUNTER — Other Ambulatory Visit: Payer: Self-pay | Admitting: *Deleted

## 2015-01-22 ENCOUNTER — Ambulatory Visit: Payer: Medicaid Other | Admitting: Nurse Practitioner

## 2015-01-23 NOTE — Progress Notes (Addendum)
Patient ID: Tamara Briggs, female   DOB: 10/18/1985, 29 y.o.   MRN: [id number] Order received 01/14/2015 by Dr. Judyann Munsonynthia Snider to [facility]).  Patient was evaluated on 01/15/2015 for [facility].  Patient was consented to care at this time.    Frequency / Duration of [facility] visits:1wk10 391mo1, 3PRN's for change in medication regimen, complications with medication regimen, post fall assessment, or complications with HIV progression  [facility] will assess for learning needs related to diagnosis and treatment regime, provide education as needed, fill pill box if deemed necessary by the RN along with fill syringes if liquid medication is required, communicate with care team including physician and case manager.  Individualized  Plan Of Care; Certification Period of 01/15/2015 to 04/15/2015 a. Type of service(s) and care to be delivered: RN/ [facility]) b. Frequency and duration of service: 1wk 10, 251mo1 3PRNS's for change in medication regimen, complications with medication regimen, post fall assessment, or complications with HIV progression c. Activity restrictions: No noted activity restrictions, patient can ambulate safely with the assistance of a cane d. Safety Measures: Fall precautions, Standard Precautions, Infection Control e. Service Objectives and Goals: Pt's current history of substance abuse and documented Mental Health limits her ability to comply. Patient Centered Goal is to provide Physical support and medical guidance to the patient. RN will coordinate with other community resources including Mental Health for the patient to receive mental health services so she can began to meet her  goal of HIV medication compliance. f. Equipment required: Cane for safe ambulation g. Functional Limitations: Ambulation, tardive Dyskinesia  h. Rehabilitation potential: Guarded i. Diet and Nutritional Needs: regular diet with  daily nutritional supplements until the patient's is identified as no longer underweight j. Medications and treatments: Have been reconciled for accuracy and listed in  [facility]ing system k. Specific therapies if needed: Not Applicable l. Pertinent diagnoses: HIV, Depression, HIV, Substance Abuse, Bipolar/Schizoaffective Disorder m. Expected outcome: Guarded

## 2015-01-23 NOTE — Patient Instructions (Addendum)
RN met with client and or caregiver for assessment. RN reviewed Transport planner, [place], Home Safety Management Information Booklet. Home Fire Safety Assessment, Fall Risk Assessment and Suicide Risk Assessment was performed. RN also discussed information on a Living Will, Advanced Directives, and [place]. RN and Client/Designated Party educated/reviewed/signed Client Agreement and Consent for Service form along with Patient Rights and Responsibilities statement. RN developed patient specific and centered care plan. RN provided contact information and reviewed how to receive emergency help after hours for schedule changes, billing questions, reporting of safety issues, falls, concerns or any needs/questions. Standard Precaution and Infection control along with interventions to correct or prevent high risk behaviors instructed to the patient. Client/Caregiver reports understanding and agreement with the above. During today's visit the patient and he Boyfriend(Robert) were very concerned about the inability to get Medicaid coverage. The patient has a order for a cane that cannot be filled due to lack of insurance. The patient would also like to be placed back on her Congentin since it really helps treat the signs of tardive Dyskinesia. Pt is currently enrolled in GED program and her "shakes" are creating a barrier for her. RN educated the patient on fall prevention measures along with ways to plan ahead to decrease the risk of falls. RN's 1st goal is to decrease the risk of falls since this is a safety measure

## 2015-01-24 ENCOUNTER — Other Ambulatory Visit: Payer: Self-pay | Admitting: *Deleted

## 2015-01-24 ENCOUNTER — Telehealth: Payer: Self-pay | Admitting: *Deleted

## 2015-01-24 NOTE — Telephone Encounter (Signed)
Contacted the patient and unable to get in contact with the patient. Left a detailed message asking the patient to return my call so we can arrange a home visit. Home visit rescheduled for tomorrow

## 2015-01-25 ENCOUNTER — Ambulatory Visit: Payer: Self-pay | Admitting: *Deleted

## 2015-01-25 VITALS — BP 94/60 | HR 84 | Temp 98.1°F | Resp 18

## 2015-01-25 DIAGNOSIS — G2401 Drug induced subacute dyskinesia: Secondary | ICD-10-CM

## 2015-01-25 DIAGNOSIS — B20 Human immunodeficiency virus [HIV] disease: Secondary | ICD-10-CM

## 2015-01-28 ENCOUNTER — Telehealth: Payer: Self-pay | Admitting: *Deleted

## 2015-01-28 NOTE — Patient Instructions (Signed)
Please refer to progress note for education provided 

## 2015-01-28 NOTE — Progress Notes (Signed)
Patient ID: Gretta CoolMaysha Prabhu, female   DOB: 07/09/1986, 29 y.o.   MRN: [id number] RN visit conducted today with a focus on medication adherence by ensuring the pt's Mental disorder is treated effectively. RN contacted the pharmacy and requested a refill for the patient's Congentin (treatment of her tardive Dyskinesia) educated the pt and her boyfriend that Dr Anne HahnWillis has already ordered the medication so the refill has been called in. RN instructed the patient that at this time she really needs to make her appt with Dr Anne HahnWillis on July 11th. RN gave the patient a yearly calendar and updated the calendar with all of the appt dates and times to assist her will making her appts. RN conferenced with Alex([facility] Case Manager) on how I can assist with getting the patient's medicaid approved. Alex stated [facility] and it may help if I call as well.

## 2015-01-28 NOTE — Telephone Encounter (Signed)
Contacted  [facility] Pharm[facility] and [facility] the pt[facility] [address] so the pt's ADAP benefits will cover the cost of her medications. Pharmacy Tech [facility]

## 2015-02-01 ENCOUNTER — Ambulatory Visit: Payer: Self-pay | Admitting: *Deleted

## 2015-02-01 VITALS — BP 96/62 | HR 68 | Resp 18

## 2015-02-01 DIAGNOSIS — B20 Human immunodeficiency virus [HIV] disease: Secondary | ICD-10-CM

## 2015-02-01 DIAGNOSIS — G2401 Drug induced subacute dyskinesia: Secondary | ICD-10-CM

## 2015-02-01 NOTE — Progress Notes (Signed)
Arrived at the patient's home and noted the patient was more lethargic than normal. RN questioned the patient on what may be different today than our normal visits. Pt states nothing is different and declines any drug use but states she is slightly congested and has been taking a OTC decongestant medication that has her sleepy.RN instructed the patient to continue to stay hydrated and rest. Instructed the patient that if the congestion is accompanied with colored mucous, increased SOB or inability to eat to call myself or the clinic.  Pt states she continues to take her medications daily and is not have any concerns with taking her medications. Pt states she really wants to get her mcd back. Currently the patient is receiving assistance through Alex(THP) and they are working on getting her MCD reinstated. Currently the significance of the patient with getting her MCD is that the pt must keep her appt with the Neurologist who does not accept orange card or ADAP benefits.

## 2015-02-12 ENCOUNTER — Ambulatory Visit: Payer: Self-pay | Admitting: Internal Medicine

## 2015-02-12 ENCOUNTER — Encounter: Payer: Self-pay | Admitting: *Deleted

## 2015-02-13 NOTE — Progress Notes (Signed)
Patient ID: Tamara Briggs, female   DOB: [birth year], 29 y.o.   MRN: [id number]  RN meet with the patient at the clinic. Pt stated she was not aware that her appt was at 9:30 this morning and is over a hr late for her appt. Dr Drue Second will try to see the patient but while she was waiting she asked if I could assist her with getting her foodstamps approved. Pt stated she needs a note to be sent to her Medicaid Case Worker verfiying that she has tardive dyskinesia and cannot stand for long periods of time independently. RN reviewed the patient's chart and noted in several visit notes her limitations is discussed. Pt consented to having notes from Neurology and Dr Drue Second sent to her Medicaid Case Worker. RN verified with Clinic Lead/Tamara Briggs that notes may be sent to Social Services with the patient's consent. Tamara Briggs verified that the information can be sent only with the patient's consent. RN contacted the Dept of Social Services at [phone] to verify the fax number and case worker's name. Pt's case worker is Tamara Briggs [phone] with fax number of [phone]. RN left Ms. Tamara Briggs a message stating at this time I will be faxing over confidential patient information for [facility] as requested by the patient to assist with her SNAP application.  Dr Feliz Beam note from 01/08/15 and Dr Anne Hahn' note from 06/13/14 were faxed successfully.

## 2015-02-15 NOTE — Patient Instructions (Signed)
Please refer to progress note for education provided 

## 2015-02-19 ENCOUNTER — Encounter: Payer: Self-pay | Admitting: *Deleted

## 2015-02-22 ENCOUNTER — Ambulatory Visit: Payer: Self-pay | Admitting: *Deleted

## 2015-02-22 VITALS — BP 100/58 | HR 60 | Temp 97.1°F | Resp 18

## 2015-02-22 DIAGNOSIS — B20 Human immunodeficiency virus [HIV] disease: Secondary | ICD-10-CM

## 2015-02-22 NOTE — Progress Notes (Signed)
Patient ID: Tamara Briggs, female   DOB: 12/10/1985, 29 y.o.   MRN: [id number] RN received a urgent message from the patient stating she received a letter in the mail from [facility] stating they will not approve her Food Stamp applications since the required documentation, that the pt requested I send stating she has a limiting disability, was never received. RN still had the needed documentation so I planned to hand deliver the documentation to Social Services for the patient. Once the RN arrived I was informed after waiting in a long line that I would have to take a number before the ppk could be taken. RN's number was called and after the foodstamp worker looked in the patient's chart and noted the needed documentation has been in their system since June 22nd. This is the same day that I faxed the documentation over to [facility]. RN contacted the patient and spoke with her Boyfriend, Tamara Briggs, I made him aware that they have the requested documentation in the system. Tamara Briggs stated Tamara Briggs just wanted to be sure.

## 2015-02-22 NOTE — Progress Notes (Signed)
Patient ID: Tamara Briggs, female   DOB: 11/23/1985, 29 y.o.   MRN: [id number] Home visit performed today and pt has been doing well. She states she takes her medications everyday. RN asked the patient if she could remember a day that she missed her medications. Pt stated she can remember missing Wednesday morning because she did a lot of running around that morning. Pt states next week she has a lot of appts as well with DSS(Medicaid Hearing), Dr Drue SecondSnider and Trinna PostAlex all next week. [facility] scheduled for Sept [facility]th at 11:00.  RN instructed the patient that it would be good for her to get a small pillbox canister that connects to her keychain for daily pills. RN gave the patient a keychain daily pill canister. Pt was grateful and stated this will help her stay on track with taking her medications when she is not home. RN question the patient on if she has used any crack recently. Pt stated she has not but has been able to stay away by keeping busy with other things. RN offered the services of Jodi([facility] Pt agreed to receive this service to keep her deal with substance abuse.  RN scheduled a time for the patient with Lennox LaityJodi and will do a warm handoff to HubbellJodi in which I will give her a update on the patient and the reason for the referral to her services. Focus of today's visit is to assist the patient with being compliant daily with her medications regimen and assist her with making better life chooses by offering other resources to assist.

## 2015-02-26 NOTE — Patient Instructions (Signed)
Please refer to progress note for education and assistance provided.

## 2015-02-27 ENCOUNTER — Ambulatory Visit: Payer: Self-pay | Admitting: Internal Medicine

## 2015-02-27 ENCOUNTER — Telehealth: Payer: Self-pay | Admitting: *Deleted

## 2015-02-27 ENCOUNTER — Ambulatory Visit: Payer: Self-pay | Admitting: *Deleted

## 2015-02-27 NOTE — Telephone Encounter (Signed)
Contacted the patient this AM and asked her to come to the clinic at 3pm to see Lennox LaityJodi (substance abuse counselor) before her 4pm appt with Dr Drue SecondSnider. Patient she can do that without a problem. Plan is for patient to see the Substance Abuse Counselor for her 1st appt at 3pm then f/u with Dr [facility]ue [facility]Snider at [facility]. The purpose of this communication was to also remind the patient of her appt time with Dr Drue SecondSnider, since she was late for her last appt and was unable to be seen

## 2015-03-04 ENCOUNTER — Ambulatory Visit: Payer: Medicaid Other | Admitting: Nurse Practitioner

## 2015-03-05 ENCOUNTER — Encounter: Payer: Self-pay | Admitting: Nurse Practitioner

## 2015-03-05 ENCOUNTER — Telehealth: Payer: Self-pay | Admitting: *Deleted

## 2015-03-05 NOTE — Telephone Encounter (Signed)
I called and spoke to Molly Maduroobert, and relayed about rescheduling appt for pt.  She had called previously asking about how much her appt would be.  She did not have insurance.  He stated that she had not spoken to billing as yet.  She has applied for medicaid.  (I think there was a lapse).  I spoke with Gaynell, in billing.  She can be seen, and they would retro actively file.  He would have pt call and make appt.  (I encouraged her to speak to billing as well).    He verbalized understanding.

## 2015-03-06 ENCOUNTER — Telehealth: Payer: Self-pay | Admitting: *Deleted

## 2015-03-06 NOTE — Telephone Encounter (Signed)
RN spoke with Nadine CountsBob who stated he did not know where Araiya was. RN thanked him and stated she would [facility] to [facility] at a later hour

## 2015-03-06 NOTE — Telephone Encounter (Signed)
RN contacted the pt at another number and spoke directly to the patient who stated today would not be a good day for a visit. Pt stated she will be available tomorrow at 2. Home visit will be made tomorrow at 2pm

## 2015-03-07 ENCOUNTER — Telehealth: Payer: Self-pay | Admitting: *Deleted

## 2015-03-07 ENCOUNTER — Other Ambulatory Visit: Payer: Self-pay | Admitting: *Deleted

## 2015-03-07 NOTE — Telephone Encounter (Signed)
RN received text message from the patient stating "I'm not gonna be able to see you today. Sorry, text me back please. Thank you". RN responded to text by stating " Ok, that's fine but are you ok?  RN retexted the message "Are you Ok" several times without a response. The concern is that the pt has increased her use of drugs. Spoke with the pt's boyfriend who states he has no idea where she is.

## 2015-03-08 ENCOUNTER — Other Ambulatory Visit: Payer: Self-pay | Admitting: *Deleted

## 2015-03-08 DIAGNOSIS — R627 Adult failure to thrive: Secondary | ICD-10-CM

## 2015-03-08 MED ORDER — ENSURE PO LIQD
237.0000 mL | Freq: Two times a day (BID) | ORAL | Status: DC
Start: 2015-03-08 — End: 2016-03-26

## 2015-03-14 ENCOUNTER — Encounter: Payer: Self-pay | Admitting: Nurse Practitioner

## 2015-03-14 ENCOUNTER — Ambulatory Visit (INDEPENDENT_AMBULATORY_CARE_PROVIDER_SITE_OTHER): Payer: Medicaid Other | Admitting: Nurse Practitioner

## 2015-03-14 VITALS — BP 85/52 | HR 69 | Ht 64.0 in | Wt 102.2 lb

## 2015-03-14 DIAGNOSIS — G219 Secondary parkinsonism, unspecified: Secondary | ICD-10-CM

## 2015-03-14 DIAGNOSIS — G2401 Drug induced subacute dyskinesia: Secondary | ICD-10-CM | POA: Diagnosis not present

## 2015-03-14 MED ORDER — BENZTROPINE MESYLATE 0.5 MG PO TABS: 0.5000 mg | ORAL_TABLET | Freq: Two times a day (BID) | ORAL | Status: DC

## 2015-03-14 NOTE — Progress Notes (Signed)
[facility]  PATIENT: Tamara Briggs DOB: 1985-11-15   REASON FOR VISIT: Follow-up for secondary parkinsonism, tardive dyskinesia, gait disorder HISTORY FROM: Patient    HISTORY OF PRESENT ILLNESS:Tamara Briggs is a 29 year old right-handed black female with a history of schizophrenia on [place]. The patient has developed a gait disorder and tardive dyskinesia on the medication. She was  placed on Cogentin which seems to have helped her significantly with the walking. The patient has not normalized, but she is walking with less effort, and she is not falling. The patient still has some tardive dyskinesia movements. The patient has been seen by her psychiatrist, and was felt that they would not change the Latuda medication at this point. The patient returns for an evaluation.   REVIEW OF SYSTEMS: Full 14 system review of systems performed and notable only for those listed, all others are neg:  Constitutional: neg  Cardiovascular: neg Ear/Nose/Throat: neg  Skin: neg Eyes: Blurred vision Respiratory: neg Gastroitestinal: neg  Hematology/Lymphatic: neg  Endocrine: neg Musculoskeletal: Walking difficulty, muscle cramps Allergy/Immunology: neg Neurological: Abnormal movements Psychiatric: neg Sleep : Restless legs   ALLERGIES: No Known Allergies  HOME MEDICATIONS: Outpatient Prescriptions Prior to Visit  Medication Sig Dispense Refill  . acetaminophen (TYLENOL) 325 MG tablet Take 1 tablet (325 mg total) by mouth every 6 (six) hours as needed. 21 tablet 0  . Alum & Mag Hydroxide-Simeth (MAGIC MOUTHWASH W/LIDOCAINE) SOLN Take 5 mLs by mouth 3 (three) times daily as needed for mouth pain. 50 mL 0  . atazanavir-cobicistat (EVOTAZ) 300-150 MG per tablet Take 1 tablet by mouth daily. Swallow whole. Do NOT crush, cut or chew tablet. Take with food. 30 tablet 5  . benztropine (COGENTIN) 0.5 MG tablet Take 1 tablet (0.5 mg total) by mouth 2 (two) times daily. 60 tablet 6  .  emtricitabine-tenofovir (TRUVADA) 200-300 MG per tablet Take 1 tablet by mouth daily. 30 tablet 5  . ENSURE (ENSURE) Take 237 mLs by mouth 2 (two) times daily between meals. 237 mL 11  . ibuprofen (ADVIL,MOTRIN) 600 MG tablet Take 600 mg by mouth 2 (two) times daily as needed for moderate pain.    Marland Kitchen LATUDA 40 MG TABS tablet Take 40 mg by mouth daily with breakfast.   2  . sertraline (ZOLOFT) 50 MG tablet Take 1 tablet (50 mg total) by mouth daily. 30 tablet 5  . Lurasidone HCl (LATUDA) 20 MG TABS Take 20 mg by mouth at bedtime.      No facility-administered medications prior to visit.    PAST MEDICAL HISTORY: Past Medical History  Diagnosis Date  . Crack cocaine use   . Anxiety   . Depression   . Schizophrenia   . Bipolar disorder   . 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 Otitis media of left ear 03/15/2013  . Infection   . 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 Tardive dyskinesia 06/13/2014    PAST SURGICAL HISTORY: Past Surgical History  Procedure Laterality Date  . Colposcopy  2009  . Cesarean section N/A 12/24/2013    Procedure: CESAREAN SECTION;  Surgeon: Lesly Dukes, MD;  Location: [facility];  Service: Obstetrics;  Laterality: N/A;  . Dental restoration/extraction with x-ray      FAMILY HISTORY: Family History  Problem Relation Age of Onset  . Diabetes Mother   . High blood pressure Mother   . Cancer Father     SOCIAL HISTORY: History   Social History  . Marital Status: Single  Spouse Name: N/A  . Number of Children: 1  . Years of Education: 11th   Occupational History  . Not on file.   Social History Main Topics  . Smoking status: Current Every Day Smoker -- 0.10 packs/day for 3 years    Types: Cigarettes  . Smokeless tobacco: Never Used     Comment: cutting back  . Alcohol Use: No  . Drug Use: No  . Sexual Activity:    Partners: Male    Birth Control/ Protection: None   Other Topics Concern  . Not on file   Social History Narrative   Patient lives at  home with her partner [name].   Unemployed.   Education 11 th grade.   Right handed.   Caffeine sometimes not daily.     PHYSICAL EXAM  Filed Vitals:   03/14/15 1007  BP: 85/52  Pulse: 69  Height:  (1.626 m)  Weight: 102 lb 3.2 oz (46.358 kg)   Body mass index is 17.53 kg/(m^2). General: The patient is alert and cooperative at the time of the examination. Skin: No significant peripheral edema is noted.  Neurologic Exam Mental status: The patient is oriented x 3. Follows all commands,   Cranial nerves: Facial symmetry is present. Speech is normal, no aphasia or dysarthria is noted. Extraocular movements are full. Visual fields are full. Continues movements of the mouth and lips are noted. Motor: The patient has good strength in all 4 extremities. Sensory examination: Soft touch sensation is symmetric on the face, arms, and legs. Coordination: The patient has good finger-nose-finger and heel-to-shin bilaterally. Gait and station: The patient has a slightly unsteady gait. Some dystonic movements of the left arm are noted.. Tandem gait is slightly unsteady. Romberg is negative. No drift is seen. She ambulates with a 4 prong cane Reflexes: Deep tendon reflexes are symmetric.   DIAGNOSTIC DATA (LABS, IMAGING, TESTING) - I reviewed patient records, labs, notes, testing and imaging myself where available.  Lab Results  Component Value Date   WBC 4.5 11/27/2014   HGB 12.4 11/27/2014   HCT 36.4 11/27/2014   MCV 81.3 11/27/2014   PLT 214 11/27/2014      Component Value Date/Time   NA 138 11/27/2014 1208   K 3.6 11/27/2014 1208   CL 106 11/27/2014 1208   CO2 24 11/27/2014 1208   GLUCOSE 65* 11/27/2014 1208   BUN 12 11/27/2014 1208   CREATININE 0.84 11/27/2014 1208   CREATININE 0.93 07/20/2014 1537   CALCIUM 8.3* 11/27/2014 1208   PROT 7.4 11/27/2014 1208   ALBUMIN 4.0 11/27/2014 1208   AST 18 11/27/2014 1208   ALT 14 11/27/2014 1208   ALKPHOS 84 11/27/2014  1208   BILITOT 1.1 11/27/2014 1208   GFRNONAA >89 11/27/2014 1208   GFRNONAA 82* 07/20/2014 1537   GFRAA >89 11/27/2014 1208   GFRAA >90 07/20/2014 1537    ASSESSMENT AND PLAN  29 y.o. 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 here to follow-up.The patient is a current patient of Dr. Anne Hahn  who is out of the office today . This note is sent to the work in doctor.     Continue Cogentin will refill Use 4 prong cane at all times at risk for falls Follow-up in 6-8 months Nilda Riggs, [facility], [facility], APRN  [facility] [address], [address]  [place], [place] [address] [phone]

## 2015-03-14 NOTE — Patient Instructions (Signed)
Continue Cogentin will refill Follow-up in 6-8 months

## 2015-03-14 NOTE — Progress Notes (Signed)
I agree with the assessment and plan as directed by NP .The patient is known to me .   Rejeana Fadness, MD  

## 2015-03-20 ENCOUNTER — Ambulatory Visit: Payer: Self-pay | Admitting: *Deleted

## 2015-03-21 ENCOUNTER — Ambulatory Visit: Payer: Self-pay | Admitting: *Deleted

## 2015-03-21 VITALS — BP 100/60 | HR 78 | Temp 97.6°F | Resp 18

## 2015-03-21 DIAGNOSIS — B20 Human immunodeficiency virus [HIV] disease: Secondary | ICD-10-CM

## 2015-03-22 NOTE — Progress Notes (Signed)
Patient ID: Tamara Briggs, female   DOB: Jun 14, 1986, 29 y.o.   MRN: [id number] RN visit conducted today and pt was noted to be walking with her cane inside her home. Pt states she has been feeling well and has been complying with her medication regimen daily. Pt stated on of her medications makes her tire. RN instructed the patient that her latuda may cause drowsiness and was recently increased from  to . Advised the patient that she may become adjusted to the medication change. RN nonjudgmentally asked the patient about her missed appt with Jodi(Substance Abuse Counselor) the patient acknowledged that she missed her appt but has rescheduled her appt for Aug2 with Lennox Laity. RN reviewed with the patient all up coming appts to ensure she is aware of her appt dates and times. Pt declines any drug use recently and states she is trying to stop smoking. Rn instructed the patient to continue to keep her mind occupied and focused on making great choices. We discussed the patient's crossword puzzles that she loves and what she does doing to day to stay busy and focused. Plan at this time is to continue to support the patient and she try to make better choices and comply with her medication regimen. Pt also questioned when she is suppose to get her depo-provera shot. RN could not locate the exact date for her last shot so a appt note will be added to her appt date

## 2015-03-22 NOTE — Patient Instructions (Signed)
Please refer to progress note for education provided 

## 2015-03-26 ENCOUNTER — Ambulatory Visit: Payer: Self-pay | Admitting: *Deleted

## 2015-03-26 DIAGNOSIS — F1410 Cocaine abuse, uncomplicated: Secondary | ICD-10-CM

## 2015-03-27 ENCOUNTER — Other Ambulatory Visit: Payer: Self-pay | Admitting: *Deleted

## 2015-03-27 ENCOUNTER — Telehealth: Payer: Self-pay | Admitting: *Deleted

## 2015-03-27 DIAGNOSIS — Z3042 Encounter for surveillance of injectable contraceptive: Secondary | ICD-10-CM

## 2015-03-27 NOTE — BH Specialist Note (Signed)
Tamara Briggs was present for her scheduled appointment today with counselor.  Client was alert and talkative.  Client appeared a little learning disabled as evidenced by exhibiting childlike behavior and maturity.  Client was very child like and innocent acting.  Client was open and shared freely.  Client did appear to be under reporting about her cocaine abuse.  Client reported that she stopped using crack cocaine three months ago on her own in order to stay clean to see her 77 year old daughter whom she does not have custody of nor does she get to see her in person as a direct result of her addiction.  Client says that she has not seen her daughter in six months. But talks to her on the phone often. Client states that she has been smoking crack for a number of years and is very tired on this addiction.  Client communicated that she has been working on her GED and should graduate in a couple of months.  Client agreed she needed additional help and said that she would strongly consider going into substance abuse outpatient treatment at [facility].  Counselor set up an appointment for next Tuesday at [facility].  Client was given two free bus passes to assist with transportation to and from [facility] and [facility]. Client made another appointment with counselor in two weeks to follow up on outpatient services and progress.  Jenel Lucks, LPCA, MA [facility]

## 2015-03-27 NOTE — Telephone Encounter (Signed)
RN contacted the patient and let her know that I am proud of her. RN thanked the patient for keeping her appt with Lennox Laity and making plans to see her at the [facility] building. RN asked the patient if she would like to have a appt for her Depo shot. RN arranged the appt for the patient for tomorrow at 2:15 with plans to see the patient in her home on Friday at 12:00

## 2015-03-28 ENCOUNTER — Ambulatory Visit: Payer: Self-pay

## 2015-03-29 ENCOUNTER — Telehealth: Payer: Self-pay | Admitting: *Deleted

## 2015-03-29 ENCOUNTER — Other Ambulatory Visit: Payer: Self-pay | Admitting: *Deleted

## 2015-03-29 NOTE — Telephone Encounter (Signed)
RN contacted the patient and left a message asking the patient to return my call to confirm visit for today. RN would like to ensure the patient will be home and ready for visit before driving to the patient's house. Current concern is that the patient is using again and was distracted during our last conversation when this appt was made

## 2015-04-04 ENCOUNTER — Ambulatory Visit: Payer: Self-pay | Admitting: Internal Medicine

## 2015-04-09 ENCOUNTER — Ambulatory Visit: Payer: Self-pay | Admitting: *Deleted

## 2015-04-10 ENCOUNTER — Telehealth: Payer: Self-pay | Admitting: *Deleted

## 2015-04-10 NOTE — Telephone Encounter (Signed)
RN contacted the patient and left a message asking the patient if I could make a home visit today. RN waiting on a return call from the patient at this time

## 2015-04-11 ENCOUNTER — Other Ambulatory Visit: Payer: Self-pay | Admitting: *Deleted

## 2015-04-12 ENCOUNTER — Other Ambulatory Visit: Payer: Self-pay | Admitting: *Deleted

## 2015-04-18 ENCOUNTER — Telehealth: Payer: Self-pay | Admitting: *Deleted

## 2015-04-19 NOTE — Telephone Encounter (Signed)
RN received a call from the patient stating she could not visit this week but would like to have a home visit on Monday (04/22/15). RN arranged a visit for Monday with plans to see the patient at that time

## 2015-04-22 ENCOUNTER — Ambulatory Visit (INDEPENDENT_AMBULATORY_CARE_PROVIDER_SITE_OTHER): Payer: Medicaid Other | Admitting: Neurology

## 2015-04-22 ENCOUNTER — Encounter: Payer: Self-pay | Admitting: Neurology

## 2015-04-22 VITALS — BP 90/58 | HR 72 | Ht 64.0 in | Wt 95.0 lb

## 2015-04-22 DIAGNOSIS — M25562 Pain in left knee: Secondary | ICD-10-CM

## 2015-04-22 DIAGNOSIS — G2401 Drug induced subacute dyskinesia: Secondary | ICD-10-CM | POA: Diagnosis not present

## 2015-04-22 DIAGNOSIS — G219 Secondary parkinsonism, unspecified: Secondary | ICD-10-CM

## 2015-04-22 NOTE — Progress Notes (Signed)
Reason for visit: Knee pain  Tamara Briggs is an 29 y.o. female  History of present illness:  Tamara Briggs is a 29 year old right-handed black female with a history of secondary parkinsonism. 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 The patient has come in today indicating that she has had some issues with walking which indicates that she will have daily events of hyperextension of the left knee, and the knee will seem to stiffen, and have quite a bit of pain. She is not able to walk at that time, she has to sit down and rub the knee and stretch it to get rid of the pain. The whole leg may get numb, she denies any back pain. It takes about 10 or 15 minutes to get over the episode, and then she is able to walk normally again. She has not had any falls. Occasionally, she may use a cane for ambulation. She comes to this office for further evaluation.  Past Medical History  Diagnosis Date  . Crack cocaine use   . Anxiety   . Depression   . Schizophrenia   . Bipolar disorder   . 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 Otitis media of left ear 03/15/2013  . Infection   . 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 Tardive dyskinesia 06/13/2014    Past Surgical History  Procedure Laterality Date  . Colposcopy  2009  . Cesarean section N/A 12/24/2013    Procedure: CESAREAN SECTION;  Surgeon: Lesly Dukes, MD;  Location: [facility];  Service: Obstetrics;  Laterality: N/A;  . Dental restoration/extraction with x-ray      Family History  Problem Relation Age of Onset  . Diabetes Mother   . High blood pressure Mother   . Cancer Father     Social history:  reports that she has been smoking Cigarettes.  She has a .3 pack-year smoking history. She has never used smokeless tobacco. She reports that she does not drink alcohol or use illicit drugs.   No Known Allergies  Medications:  Prior to Admission medications   Medication Sig Start Date End Date  Taking? Authorizing Provider  acetaminophen (TYLENOL) 325 MG tablet Take 1 tablet (325 mg total) by mouth every 6 (six) hours as needed. 06/01/14  Yes Jennifer Piepenbrink, PA-C  Alum & Mag Hydroxide-Simeth (MAGIC MOUTHWASH W/LIDOCAINE) SOLN Take 5 mLs by mouth 3 (three) times daily as needed for mouth pain. 01/08/15  Yes Judyann Munson, MD  atazanavir-cobicistat (EVOTAZ) 300-150 MG per tablet Take 1 tablet by mouth daily. Swallow whole. Do NOT crush, cut or chew tablet. Take with food. 11/27/14  Yes Gardiner Barefoot, MD  benztropine (COGENTIN) 0.5 MG tablet Take 1 tablet (0.5 mg total) by mouth 2 (two) times daily. 03/14/15  Yes Nilda Riggs, NP  emtricitabine-tenofovir (TRUVADA) 200-300 MG per tablet Take 1 tablet by mouth daily. 11/27/14  Yes Gardiner Barefoot, MD  ENSURE (ENSURE) Take 237 mLs by mouth 2 (two) times daily between meals. 03/08/15  Yes Judyann Munson, MD  ibuprofen (ADVIL,MOTRIN) 600 MG tablet Take 600 mg by mouth 2 (two) times daily as needed for moderate pain.   Yes Historical Provider, MD  LATUDA 40 MG TABS tablet Take 40 mg by mouth daily with breakfast.  07/03/14  Yes Historical Provider, MD  sertraline (ZOLOFT) 50 MG tablet Take 1 tablet (50 mg total) by mouth daily. 08/06/14  Yes Judyann Munson, MD    ROS:  Out of  a complete 14 system review of symptoms, the patient complains only of the following symptoms, and all other reviewed systems are negative.  Blurred vision Restless legs Joint pain, achy muscles, walking difficulty Depression  Blood pressure 90/58, pulse 72, height  (1.626 m), weight 95 lb (43.092 kg), not currently breastfeeding.  Physical Exam  General: The patient is alert and cooperative at the time of the examination.  Skin: No significant peripheral edema is noted.   Neurologic Exam  Mental status: The patient is alert and oriented x 3 at the time of the examination. The patient has apparent normal recent and remote memory, with an apparently  normal attention span and concentration ability.   Cranial nerves: Facial symmetry is present. Speech is normal, no aphasia or dysarthria is noted. Extraocular movements are full. Visual fields are full.  Motor: The patient has good strength in all 4 extremities.  Sensory examination: Soft touch sensation is symmetric on the face, arms, and legs.  Coordination: The patient has good finger-nose-finger and heel-to-shin bilaterally. Choreoathetoid movements are noted projecting in both upper extremities.  Gait and station: The patient has a slightly wide-based gait. Tandem gait is mildly unsteady. Romberg is negative. No drift is seen.  Reflexes: Deep tendon reflexes are symmetric.   Assessment/Plan:  1. Tardive movement disorder  2. Reports of left knee discomfort  The patient indicates that she will have episodes of hyperextension of the left knee unassociated with pain. She will have to bend the knee and dissipate the pain for 10 or 15 minutes before she can walk again. I will try using a knee brace to see if a support brace can prevent the pain. The patient will follow-up otherwise on her next scheduled appointment in March 2017.  Marlan Palau MD 04/22/2015 7:07 PM  [facility] [address] [place], [place] [address]  Phone [phone] Fax [phone]

## 2015-04-22 NOTE — Patient Instructions (Addendum)
We will try a knee brace to prevent hyperextension and pain.  Knee Bracing Knee braces are supports to help stabilize and protect an injured or painful knee. They come in many different styles. They should support and protect the knee without increasing the chance of other injuries to yourself or others. It is important not to have a false sense of security when using a brace. Knee braces that help you to keep using your knee:  Do not restore normal knee stability under high stress forces.  May decrease some aspects of athletic performance. Some of the different types of knee braces are:  Prophylactic knee braces are designed to prevent or reduce the severity of knee injuries during sports that make injury to the knee more likely.  Rehabilitative knee braces are designed to allow protected motion of:  Injured knees.  Knees that have been treated with or without surgery. There is no evidence that the use of a supportive knee brace protects the graft following a successful anterior cruciate ligament (ACL) reconstruction. However, braces are sometimes used to:   Protect injured ligaments.  Control knee movement during the initial healing period. They may be used as part of the treatment program for the various injured ligaments or cartilage of the knee including the:  Anterior cruciate ligament.  Medial collateral ligament.  Medial or lateral cartilage (meniscus).  Posterior cruciate ligament.  Lateral collateral ligament. Rehabilitative knee braces are most commonly used:  During crutch-assisted walking right after injury.  During crutch-assisted walking right after surgery to repair the cartilage and/or cruciate ligament injury.  For a short period of time, 2-8 weeks, after the injury or surgery. The value of a rehabilitative brace as opposed to a cast or splint includes the:  Ability to adjust the brace for swelling.  Ability to remove the brace for examinations,  icing, or showering.  Ability to allow for movement in a controlled range of motion. Functional knee braces give support to knees that have already been injured. They are designed to provide stability for the injured knee and provide protection after repair. Functional knee braces may not affect performance much. Lower extremity muscle strengthening, flexibility, and improvement in technique are more important than bracing in treating ligamentous knee injuries. Functional braces are not a substitute for rehabilitation or surgical procedures. Unloader/off-loader braces are designed to provide pain relief in arthritic knees. Patients with wear and tear arthritis from growing old or from an old cartilage injury (osteoarthritis) of the knee, and bowlegged (varus) or knock-knee (valgus) deformities, often develop increased pain in the arthritic side due to increased loading. Unloader/off-loader braces are made to reduce uneven loading in such knees. There is reduction in bowing out movement in bowlegged knees when the correct unloader brace is used. Patients with advanced osteoarthritis or severe varus or valgus alignment problems would not likely benefit from bracing. Patellofemoral braces help the kneecap to move smoothly and well centered over the end of the femur in the knee.  Most people who wear knee braces feel that they help. However, there is a lack of scientific evidence that knee braces are helpful at the level needed for athletic participation to prevent injury. In spite of this, athletes report an increase in knee stability, pain relief, performance improvement, and confidence during athletics when using a brace.  Different knee problems require different knee braces:  Your caregiver may suggest one kind of knee brace after knee surgery.  A caregiver may choose another kind of knee brace for  support instead of surgery for some types of torn ligaments.  You may also need one for pain in the front  of your knee that is not getting better with strengthening and flexibility exercises. Get your caregiver's advice if you want to try a knee brace. The caregiver will advise you on where to get them and provide a prescription when it is needed to fashion and/or fit the brace. Knee braces are the least important part of preventing knee injuries or getting better following injury. Stretching, strengthening and technique improvement are far more important in caring for and preventing knee injuries. When strengthening your knee, increase your activities a little at a time so as not to develop injuries from overuse. Work out an exercise plan with your caregiver and/or physical therapist to get the best program for you. Do not let a knee brace become a crutch. Always remember, there are no braces which support the knee as well as your original ligaments and cartilage you were born with. Conditioning, proper warm-up, and stretching remain the most important parts of keeping your knees healthy. HOW TO USE A KNEE BRACE  During sports, knee braces should be used as directed by your caregiver.  Make sure that the hinges are where the knee bends.  Straps, tapes, or hook-and-loop tapes should be fastened around your leg as instructed.  You should check the placement of the brace during activities to make sure that it has not moved. Poorly positioned braces can hurt rather than help you.  To work well, a knee brace should be worn during all activities that put you at risk of knee injury.  Warm up properly before beginning athletic activities. HOME CARE INSTRUCTIONS  Knee braces often get damaged during normal use. Replace worn-out braces for maximum benefit.  Clean regularly with soap and water.  Inspect your brace often for wear and tear.  Cover exposed metal to protect others from injury.  Durable materials may cost more, but last longer. SEEK IMMEDIATE MEDICAL CARE IF:   Your knee seems to be  getting worse rather than better.  You have increasing pain or swelling in the knee.  You have problems caused by the knee brace.  You have increased swelling or inflammation (redness or soreness) in your knee.  Your knee becomes warm and more painful and you develop an unexplained temperature over 101F (38.3C). MAKE SURE YOU:   Understand these instructions.  Will watch your condition.  Will get help right away if you are not doing well or get worse. See your caregiver, physical therapist, or orthopedic surgeon for additional information. Document Released: 10/31/2003 Document Revised: 12/25/2013 Document Reviewed: 02/06/2009 [facility] Patient Information 2015 [place], [place]. This information is not intended to replace advice given to you by your health care provider. Make sure you discuss any questions you have with your health care provider.

## 2015-04-24 ENCOUNTER — Ambulatory Visit: Payer: Self-pay | Admitting: *Deleted

## 2015-04-24 VITALS — BP 92/58 | HR 60 | Temp 97.3°F | Resp 18 | Wt 93.4 lb

## 2015-04-24 DIAGNOSIS — B20 Human immunodeficiency virus [HIV] disease: Secondary | ICD-10-CM

## 2015-04-24 NOTE — Patient Instructions (Addendum)
RN conducted a home visit the patient today and the need to re certify the patient for continued home visits identified. Patient states she is doing well but appears to be having trouble with follow through. RN concern is that the patient is missing several appts with the Infectious Disease clinic and is not focused on caring for herself. RN plans to continue to assist the patient is managing her life and reaching goals that that she sets for herself.

## 2015-04-25 ENCOUNTER — Telehealth: Payer: Self-pay | Admitting: *Deleted

## 2015-04-25 NOTE — Telephone Encounter (Signed)
During my last visit with the patient Dr Anne Hahn ordered the patient a knee brace for her left knee. RN traveled to [facility] and assessed the knee braces for the patient. [facility] allowed me to borrow a knee brace to assess the fitting for the patient. Rn contacted the patient and she stated she is not home to do the fitting for her knee brace. RN will return the knee brace to [facility] until the patient is ready to be fitted

## 2015-04-30 ENCOUNTER — Telehealth: Payer: Self-pay | Admitting: *Deleted

## 2015-04-30 NOTE — Telephone Encounter (Signed)
Patient ID: Nitzia Saxe, female   DOB: 01/28/1986, 29 y.o.   MRN: [id number] Patient was re-evaluated for [facility] on 04/24/2015.RN made several attempts to contact the patient within the last 5 days of last certification without success.    RN plans to recertify with a planned Frequency / Duration of [facility] visits:Effective 04/24/2015 : 1wk1, 2mo2, 1mo1, 3PRN's for change in medication regimen, complications with medication regimen, post fall assessment, or complications with HIV progression  [facility] will assess for learning needs related to diagnosis and treatment regime, provide education as needed, fill pill box if deemed necessary by the RN along with fill syringes if liquid medication is required, communicate with care team including physician and case manager.  Individualized Plan Of Care; Certification Period of 04/24/2015 to 07/23/2015 a. Type of service(s) and care to be delivered: RN, Community Based Health Care Nurse ([facility]) b. Frequency and duration of service:Effective 04/24/2015 : 1wk1, 2mo2, 1mo1, 3PRN's for change in medication regimen, complications with medication regimen, post fall assessment, or complications with HIV progression c. Activity restrictions: No noted activity restrictions, patient can ambulate safely with the assistance of a quad cane d. Safety Measures: Fall precautions, Standard Precautions, Infection Control e. Service Objectives and Goals:Service Objectives are to assist the pt with HIV medication regimen adherence and staying in care with the Infectious Disease Clinic by identifying barriers to care. RN will address the barriers that are identified by the patient.  Pt's current history of substance abuse and documented Mental Health creates a barrier for the patient. Patient Centered Goal is to provide Physical support and medical guidance to the patient. RN will coordinate with other community resources including Mental Health for the patient to receive mental health  services so she can began to meet her goal of HIV medication compliance. f. Equipment required: Quad Cane for safe ambulation g. Functional Limitations: Ambulation, tardive Dyskinesia h. Rehabilitation potential: Guarded i. Diet and Nutritional Needs: regular diet with daily nutritional supplements until the patient's is identified as no longer underweight j. Medications and treatments: Have been reconciled for accuracy and listed in EPIC electronic charting system k. Specific therapies if needed: Not Applicable l. Pertinent diagnoses: HIV, Depression, HIV, Substance Abuse, Bipolar/Schizoaffective Disorder m. Expected outcome: Guarded          

## 2015-04-30 NOTE — Progress Notes (Signed)
Patient ID: Tamara Briggs, female   DOB: 25-Dec-1985, 29 y.o.   MRN: [id number] Patient was re-evaluated for [facility] on 04/24/2015.RN made several attempts to contact the patient within the last 5 days of last certification without success.    RN plans to recertify with a planned Frequency / Duration of [facility] visits:Effective 04/24/2015 : 1wk1, 58mo2, 39mo1, 3PRN's for change in medication regimen, complications with medication regimen, post fall assessment, or complications with HIV progression  [facility] will assess for learning needs related to diagnosis and treatment regime, provide education as needed, fill pill box if deemed necessary by the RN along with fill syringes if liquid medication is required, communicate with care team including physician and case manager.  Individualized Plan Of Care; Certification Period of 04/24/2015 to 07/23/2015 a. Type of service(s) and care to be delivered: RN, [employer] Based Health Care Nurse [facility]) b. Frequency and duration of service:Effective 04/24/2015 : 1wk1, 73mo2, 62mo1, 3PRN's for change in medication regimen, complications with medication regimen, post fall assessment, or complications with HIV progression c. Activity restrictions: No noted activity restrictions, patient can ambulate safely with the assistance of a quad cane d. Safety Measures: Fall precautions, Standard Precautions, Infection Control e. Service Objectives and Goals:Service Objectives are to assist the pt with HIV medication regimen adherence and staying in care with the Infectious Disease Clinic by identifying barriers to care. RN will address the barriers that are identified by the patient.  Pt's current history of substance abuse and documented Mental Health creates a barrier for the patient. Patient Centered Goal is to provide Physical support and medical guidance to the patient. RN will coordinate with other community resources including Mental Health for the patient to receive mental health  services so she can began to meet her goal of HIV medication compliance. f. Equipment required: [employer] for safe ambulation g. Functional Limitations: Ambulation, tardive Dyskinesia h. Rehabilitation potential: Guarded i. Diet and Nutritional Needs: regular diet with daily nutritional supplements until the patient's is identified as no longer underweight j. Medications and treatments: Have been reconciled for accuracy and listed in [facility] electronic charting system k. Specific therapies if needed: Not Applicable l. Pertinent diagnoses: HIV, Depression, HIV, Substance Abuse, Bipolar/Schizoaffective Disorder m. Expected outcome: Guarded

## 2015-05-03 ENCOUNTER — Telehealth: Payer: Self-pay | Admitting: Internal Medicine

## 2015-05-03 NOTE — Telephone Encounter (Signed)
I acknowledge and approve the Plan of Care as outlined by Molli Posey, outreach nurse for services provided to keep patient in care.   Duke Salvia Drue Second MD MPH [facility] [phone]

## 2015-05-10 ENCOUNTER — Other Ambulatory Visit: Payer: Self-pay | Admitting: *Deleted

## 2015-05-10 ENCOUNTER — Telehealth: Payer: Self-pay | Admitting: *Deleted

## 2015-05-14 ENCOUNTER — Ambulatory Visit: Payer: Medicaid Other | Admitting: *Deleted

## 2015-05-14 VITALS — BP 98/56 | HR 64 | Temp 98.9°F | Resp 18

## 2015-05-14 DIAGNOSIS — B20 Human immunodeficiency virus [HIV] disease: Secondary | ICD-10-CM

## 2015-05-15 NOTE — Telephone Encounter (Signed)
RN made several attempts to call to see in the patient would be available for out prearranged visit. RN also a message for the patient without a return call. Purpose of this communication is to relay that that the set frequency for home visits this week has been changed due to a missed visit. Communication has been made with the patient to ensure needs have been met and to offer a home visit. At this time a return call has not been received before the business week is out. Next week the [facility] Nurse plans to continue contact with the patient to offer any services or attempt to address any needs the patient expresses that is reasonable.

## 2015-05-16 NOTE — Progress Notes (Signed)
Patient ID: Tamara Briggs, female   DOB: 12-27-1985, 29 y.o.   MRN: [id number] RN performed a visit in the patient's home today. Pt stated she is doing well and still complying with her medication regimen daily. Pt stated she cannot think of a day that she missed taking her medications. Pt stated she is concerned that she will not have anywhere to live in December. Pt stated Bobby(Robert mason) is moving out of his appt and she is not going to continue to live with him. She stated she has lived with Reita Cliche to 28yrs and that is long enough. Pt does not have any income and her disability application is still pending. RN contacted [facility] to speak with Amber about options for the patient. Juliette Alcide stated Amber has already left for the day but a VM could be left. RN left a VM for [facility] requesting advice on options for the patient. RN reminded the patient on her upcoming appt date with Dr Drue Second as well. Pt stated she already has the date marked on her calendar. Per Plan of Care focus of today's visit is to address barrier that may keep the patient from complying with her medication regimen. If pt is without a home she is likely to stop taking her medications regularly

## 2015-05-16 NOTE — Patient Instructions (Signed)
Please refer to progress note for details of this visit 

## 2015-05-31 ENCOUNTER — Telehealth: Payer: Self-pay | Admitting: *Deleted

## 2015-06-11 ENCOUNTER — Ambulatory Visit (INDEPENDENT_AMBULATORY_CARE_PROVIDER_SITE_OTHER): Payer: Medicaid Other | Admitting: Internal Medicine

## 2015-06-11 ENCOUNTER — Encounter: Payer: Self-pay | Admitting: Internal Medicine

## 2015-06-11 VITALS — BP 96/70 | HR 99 | Temp 99.6°F | Ht 64.0 in | Wt 96.0 lb

## 2015-06-11 DIAGNOSIS — F203 Undifferentiated schizophrenia: Secondary | ICD-10-CM | POA: Diagnosis not present

## 2015-06-11 DIAGNOSIS — G2401 Drug induced subacute dyskinesia: Secondary | ICD-10-CM

## 2015-06-11 DIAGNOSIS — B20 Human immunodeficiency virus [HIV] disease: Secondary | ICD-10-CM

## 2015-06-11 DIAGNOSIS — N926 Irregular menstruation, unspecified: Secondary | ICD-10-CM

## 2015-06-11 DIAGNOSIS — Z308 Encounter for other contraceptive management: Secondary | ICD-10-CM | POA: Diagnosis not present

## 2015-06-11 DIAGNOSIS — Z0000 Encounter for general adult medical examination without abnormal findings: Secondary | ICD-10-CM

## 2015-06-11 MED ORDER — MEDROXYPROGESTERONE ACETATE 150 MG/ML IM SUSP
150.0000 mg | Freq: Once | INTRAMUSCULAR | Status: AC
  Administered 2015-06-11: 150 mg via INTRAMUSCULAR

## 2015-06-11 NOTE — Progress Notes (Signed)
Patient ID: Tamara Briggs, female   DOB: 08/23/1986, 29 y.o.   MRN: [id number]       Patient ID: Tamara Briggs, female   DOB: 12/04/1985, 29 y.o.   MRN: [id number]  HPI  29yo F with HIV disease-schizophrenia. CD 4 count of 460/VL<20 in April 2016, currently on truvada-boosted atazanavir. She has not had any changes in her psych meds. Still on latuda 40mg , cogentin 0.5mg  bid, and zoloft 50mg  daily.   Outpatient Encounter Prescriptions as of 06/11/2015  Medication Sig  . acetaminophen (TYLENOL) 325 MG tablet Take 1 tablet (325 mg total) by mouth every 6 (six) hours as needed.  . Alum & Mag Hydroxide-Simeth (MAGIC MOUTHWASH W/LIDOCAINE) SOLN Take 5 mLs by mouth 3 (three) times daily as needed for mouth pain. (Patient not taking: Reported on 05/16/2015)  . atazanavir-cobicistat (EVOTAZ) 300-150 MG per tablet Take 1 tablet by mouth daily. Swallow whole. Do NOT crush, cut or chew tablet. Take with food.  . benztropine (COGENTIN) 0.5 MG tablet Take 1 tablet (0.5 mg total) by mouth 2 (two) times daily.  Marland Kitchen. emtricitabine-tenofovir (TRUVADA) 200-300 MG per tablet Take 1 tablet by mouth daily.  Marland Kitchen. ENSURE (ENSURE) Take 237 mLs by mouth 2 (two) times daily between meals.  Marland Kitchen. ibuprofen (ADVIL,MOTRIN) 600 MG tablet Take 600 mg by mouth 2 (two) times daily as needed for moderate pain.  Marland Kitchen. LATUDA 40 MG TABS tablet Take 40 mg by mouth daily with breakfast.   . sertraline (ZOLOFT) 50 MG tablet Take 1 tablet (50 mg total) by mouth daily.   No facility-administered encounter medications on file as of 06/11/2015.     Patient Active Problem List   Diagnosis Date Noted  . 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 Tardive dyskinesia 06/13/2014  . S/P C-section 12/25/2013  . Preterm premature rupture of membranes (PPROM) delivered, current hospitalization 12/14/2013  . Normocytic anemia 12/14/2013  . 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 Abnormal findings on antenatal screening 10/27/2013  . Maternal HIV  positive complicating pregnancy in second trimester, antepartum (HCC) 10/18/2013  . Schizophrenia (HCC) 10/18/2013  . Pyelonephritis 07/21/2013  . Protein-calorie malnutrition, severe (HCC) 03/16/2013  . Tobacco abuse 03/15/2013  . h/o crack Cocaine abuse 03/15/2013  . h/o oral Thrush 03/15/2013  . HIV (human immunodeficiency virus infection) (HCC) 07/27/2011  . Chlamydia and Gonorrhea 07/27/2011  . H/O abnormal cervical Papanicolaou smear 07/26/2008     Health Maintenance Due  Topic Date Due  . INFLUENZA VACCINE  03/25/2015     Review of Systems  Physical Exam   BP 96/70 mmHg  Pulse 99  Temp(Src) 99.6 F (37.6 C) (Oral)  Ht 5\' 4"  (1.626 m)  Wt 96 lb (43.545 kg)  BMI 16.47 kg/m2  LMP 04/30/2015  Breastfeeding? No  Lab Results  Component Value Date   CD4TCELL 31* 11/27/2014   Lab Results  Component Value Date   CD4TABS 460 11/27/2014   CD4TABS 610 08/28/2014   CD4TABS 430 03/06/2014   Lab Results  Component Value Date   HIV1RNAQUANT 63* 11/27/2014   Lab Results  Component Value Date   HEPBSAB POS* 07/09/2011   No results found for: RPR  CBC Lab Results  Component Value Date   WBC 4.5 11/27/2014   RBC 4.48 11/27/2014   HGB 12.4 11/27/2014   HCT 36.4 11/27/2014   PLT 214 11/27/2014   MCV 81.3 11/27/2014   MCH 27.7 11/27/2014   MCHC 34.1 11/27/2014   RDW 14.3 11/27/2014   LYMPHSABS 1.5  11/27/2014   MONOABS 0.3 11/27/2014   EOSABS 0.1 11/27/2014   BASOSABS 0.0 11/27/2014   BMET Lab Results  Component Value Date   NA 138 11/27/2014   K 3.6 11/27/2014   CL 106 11/27/2014   CO2 24 11/27/2014   GLUCOSE 65* 11/27/2014   BUN 12 11/27/2014   CREATININE 0.84 11/27/2014   CALCIUM 8.3* 11/27/2014   GFRNONAA >89 11/27/2014   GFRAA >89 11/27/2014     Assessment and Plan  HIV disease = will check 6 month labs today. Anticipate to continue truvada-boosted atazanavir.  Health maintenance = patient received flu shot, pap next week  Schizophrenia =  still having evidence of tardive dyskinesia. Concerned that [facility] has not dropped down her dosage. i will write letter to give to patient to their provider.  intemittent dizziness = not orthostatic by BP. Wonder if she has intermittent benign positional vertigo. Will continue to monitor. Asked her to slowly transition from bed to standing as well  Birth control = will check poct ur preg test since she last had period sept 6 and now spotting. If negative ,can give depo

## 2015-06-12 NOTE — Progress Notes (Signed)
Approval faxed to [facility]. Howell, Michelle M, RN  

## 2015-06-13 LAB — T-HELPER CELL (CD4) - (RCID CLINIC ONLY)
CD4 T CELL HELPER: 31 % — AB (ref 33–55)
CD4 T Cell Abs: 740 /uL (ref 400–2700)

## 2015-06-13 LAB — HIV-1 RNA QUANT-NO REFLEX-BLD: HIV 1 RNA Quant: <20 copies/mL (ref <20)

## 2015-06-14 ENCOUNTER — Telehealth: Payer: Self-pay | Admitting: *Deleted

## 2015-06-21 ENCOUNTER — Ambulatory Visit: Payer: Medicaid Other

## 2015-07-09 ENCOUNTER — Ambulatory Visit (INDEPENDENT_AMBULATORY_CARE_PROVIDER_SITE_OTHER): Payer: Medicaid Other | Admitting: Internal Medicine

## 2015-07-09 ENCOUNTER — Encounter: Payer: Self-pay | Admitting: Internal Medicine

## 2015-07-09 VITALS — Temp 98.5°F | Wt 96.0 lb

## 2015-07-09 DIAGNOSIS — B20 Human immunodeficiency virus [HIV] disease: Secondary | ICD-10-CM | POA: Diagnosis not present

## 2015-07-09 DIAGNOSIS — F209 Schizophrenia, unspecified: Secondary | ICD-10-CM | POA: Diagnosis not present

## 2015-07-09 DIAGNOSIS — Z23 Encounter for immunization: Secondary | ICD-10-CM

## 2015-07-09 DIAGNOSIS — T887XXS Unspecified adverse effect of drug or medicament, sequela: Secondary | ICD-10-CM | POA: Diagnosis not present

## 2015-07-09 DIAGNOSIS — M25562 Pain in left knee: Secondary | ICD-10-CM | POA: Diagnosis not present

## 2015-07-09 DIAGNOSIS — T50905S Adverse effect of unspecified drugs, medicaments and biological substances, sequela: Secondary | ICD-10-CM

## 2015-07-09 LAB — POCT URINE PREGNANCY: Preg Test, Ur: NEGATIVE

## 2015-07-09 NOTE — Progress Notes (Signed)
Patient ID: Tamara Briggs, female   DOB: 09/07/1985, 29 y.o.   MRN: [id number]       Patient ID: Tamara Briggs, female   DOB: 12/26/1985, 29 y.o.   MRN: [id number]  HPI Cd 4 count of 740/VL<20 on evotaz-truvada (october 2016). She reports having worsening left knee pain. She mentions that she is falling with increasing frequency where she is injuring her self, mainly with bruising to her legs/knees. She notices that she becomes significantly sedated after taking the cogentin and feels that these recent incidences maybe due to the medication.  Initially started by neurology for treatment of secondary effects/tardive dyskinesia from latuda for which her dose is still the same  Today she does not appear to have any akisthesia, or symptoms of tardive dyskinesia  Outpatient Encounter Prescriptions as of 07/09/2015  Medication Sig  . atazanavir-cobicistat (EVOTAZ) 300-150 MG per tablet Take 1 tablet by mouth daily. Swallow whole. Do NOT crush, cut or chew tablet. Take with food.  . benztropine (COGENTIN) 0.5 MG tablet Take 1 tablet (0.5 mg total) by mouth 2 (two) times daily.  Marland Kitchen. emtricitabine-tenofovir (TRUVADA) 200-300 MG per tablet Take 1 tablet by mouth daily.  Marland Kitchen. ENSURE (ENSURE) Take 237 mLs by mouth 2 (two) times daily between meals.  Marland Kitchen. ibuprofen (ADVIL,MOTRIN) 600 MG tablet Take 600 mg by mouth 2 (two) times daily as needed for moderate pain.  Marland Kitchen. LATUDA 40 MG TABS tablet Take 40 mg by mouth daily with breakfast.   . sertraline (ZOLOFT) 50 MG tablet Take 1 tablet (50 mg total) by mouth daily.  . [DISCONTINUED] acetaminophen (TYLENOL) 325 MG tablet Take 1 tablet (325 mg total) by mouth every 6 (six) hours as needed.  . [DISCONTINUED] Alum & Mag Hydroxide-Simeth (MAGIC MOUTHWASH W/LIDOCAINE) SOLN Take 5 mLs by mouth 3 (three) times daily as needed for mouth pain. (Patient not taking: Reported on 05/16/2015)   No facility-administered encounter medications on file as of 07/09/2015.     Patient Active  Problem List   Diagnosis Date Noted  . 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 Tardive dyskinesia 06/13/2014  . S/P C-section 12/25/2013  . Preterm premature rupture of membranes (PPROM) delivered, current hospitalization 12/14/2013  . Normocytic anemia 12/14/2013  . 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 Abnormal findings on antenatal screening 10/27/2013  . Maternal HIV positive complicating pregnancy in second trimester, antepartum (HCC) 10/18/2013  . Schizophrenia (HCC) 10/18/2013  . Pyelonephritis 07/21/2013  . Protein-calorie malnutrition, severe (HCC) 03/16/2013  . Tobacco abuse 03/15/2013  . h/o crack Cocaine abuse 03/15/2013  . h/o oral Thrush 03/15/2013  . HIV (human immunodeficiency virus infection) (HCC) 07/27/2011  . Chlamydia and Gonorrhea 07/27/2011  . H/O abnormal cervical Papanicolaou smear 07/26/2008     Health Maintenance Due  Topic Date Due  . INFLUENZA VACCINE  03/25/2015     Review of Systems + left knee pain, sedation with meds.  Physical Exam   Temp(Src) 98.5 F (36.9 C) (Oral)  Wt 96 lb (43.545 kg)  LMP 05/27/2015 Physical Exam  Constitutional:  oriented to person, place, and time. appears well-developed and well-nourished. No distress.  HENT: Miranda/AT, PERRLA, no scleral icterus Mouth/Throat: Oropharynx is clear and moist. No oropharyngeal exudate.  Cardiovascular: Normal rate, regular rhythm and normal heart sounds. Exam reveals no gallop and no friction rub.  No murmur heard.  Pulmonary/Chest: Effort normal and breath sounds normal. No respiratory distress.  has no wheezes.  Neck = supple, no nuchal rigidity Abdominal: Soft. Bowel sounds  are normal.  exhibits no distension. There is no tenderness.  Lymphadenopathy: no cervical adenopathy. No axillary adenopathy Neurological: alert and oriented to person, place, and time.  Ext: decrease range of motion in her left knee due to pain. No effusion, no warmth, mild tenderness beneath  patella. Feels improved with her knee wrapped Skin: Skin is warm and dry. No rash noted. No erythema.  Psychiatric: a normal mood and affect.  behavior is normal.   Lab Results  Component Value Date   CD4TCELL 31* 06/11/2015   Lab Results  Component Value Date   CD4TABS 740 06/11/2015   CD4TABS 460 11/27/2014   CD4TABS 610 08/28/2014   Lab Results  Component Value Date   HIV1RNAQUANT <20 06/11/2015   Lab Results  Component Value Date   HEPBSAB POS* 07/09/2011   No results found for: RPR  CBC Lab Results  Component Value Date   WBC 4.5 11/27/2014   RBC 4.48 11/27/2014   HGB 12.4 11/27/2014   HCT 36.4 11/27/2014   PLT 214 11/27/2014   MCV 81.3 11/27/2014   MCH 27.7 11/27/2014   MCHC 34.1 11/27/2014   RDW 14.3 11/27/2014   LYMPHSABS 1.5 11/27/2014   MONOABS 0.3 11/27/2014   EOSABS 0.1 11/27/2014   BASOSABS 0.0 11/27/2014   BMET Lab Results  Component Value Date   NA 138 11/27/2014   K 3.6 11/27/2014   CL 106 11/27/2014   CO2 24 11/27/2014   GLUCOSE 65* 11/27/2014   BUN 12 11/27/2014   CREATININE 0.84 11/27/2014   CALCIUM 8.3* 11/27/2014   GFRNONAA >89 11/27/2014   GFRAA >89 11/27/2014     Assessment and Plan  Side effects of medication = unclear if her recent falls could all be due to cogentin. I am wondering if she would benefit from a short trial off of the medication to see what symptoms improve/worsen. we will Have her stop her cogentin for 2-3 wk and reassess her.  Will also reach out to Dr. Anne Hahn to see if he can see her sooner than her currently scheduled visit.  Knee pain = xray done by pcp ruled out fracture. Encouraged to wrap it to see if it will improve her pain with ambulation. Anticipate strain will improve  hiv disease =well controlled, continue on current regimen  Schizophrenia = appears controlled with latuda, if she has increasing side effects recommend prompt visit to [facility] to decrease its dose.

## 2015-07-11 NOTE — Telephone Encounter (Signed)
Purpose of this communication is to relay that that the set frequency for home visits this week has been changed due to a missed visit. Communication has been made with the patient to ensure needs have been met and to offer a home visit. At this time a return call has not been received before the business week is out. Next week the [facility] plans to continue contact with the patient to offer any services or attempt to address any needs the patient expresses that is reasonable.  

## 2015-07-15 ENCOUNTER — Encounter: Payer: Self-pay | Admitting: Nurse Practitioner

## 2015-07-15 ENCOUNTER — Ambulatory Visit: Payer: Medicaid Other | Admitting: Nurse Practitioner

## 2015-07-15 ENCOUNTER — Telehealth: Payer: Self-pay | Admitting: Nurse Practitioner

## 2015-07-15 ENCOUNTER — Ambulatory Visit (INDEPENDENT_AMBULATORY_CARE_PROVIDER_SITE_OTHER): Payer: Medicaid Other | Admitting: Nurse Practitioner

## 2015-07-15 VITALS — BP 108/70 | HR 89 | Ht 64.0 in | Wt 101.5 lb

## 2015-07-15 DIAGNOSIS — G2119 Other drug induced secondary parkinsonism: Secondary | ICD-10-CM | POA: Diagnosis not present

## 2015-07-15 DIAGNOSIS — R269 Unspecified abnormalities of gait and mobility: Secondary | ICD-10-CM | POA: Diagnosis not present

## 2015-07-15 DIAGNOSIS — G2401 Drug induced subacute dyskinesia: Secondary | ICD-10-CM | POA: Diagnosis not present

## 2015-07-15 DIAGNOSIS — M25562 Pain in left knee: Secondary | ICD-10-CM | POA: Diagnosis not present

## 2015-07-15 NOTE — Telephone Encounter (Signed)
Called and spoke to FaithAshley at PCP and she wanted office note faxed to [phone]. PCP will do referral for Orthopedic surgery.

## 2015-07-15 NOTE — Progress Notes (Signed)
[facility]  PATIENT: Tamara Briggs DOB: 04-21-1986   REASON FOR VISIT: Follow-up for tardive dyskinesia, gait abnormality, left knee pain, secondary parkinsonism HISTORY FROM: Patient    HISTORY OF PRESENT ILLNESS:Tamara Briggs is a 28 year old right-handed black female with a history of secondary parkinsonism. 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 This is felt to be due to [facility] which has been discontinued recently. The patient has come in today indicating that she has had some issues with walking , she  indicates that she will have daily events of hyperextension of the left knee, and the knee will seem to stiffen, and have quite a bit of pain. She is  able to walk at that time, but she feels like her left knee is going to give out she has to sit down and rub the knee and stretch it to get rid of the pain. The whole leg may get numb, she denies any back pain. She was asked to obtain a brace when last seen by Dr. Anne Hahn 8/ 29/ 2016 but she has not done that.  She has a 4 point cane She comes to this office for further evaluation. She has not had an evaluation by orthopedics   REVIEW OF SYSTEMS: Full 14 system review of systems performed and notable only for those listed, all others are neg:  Constitutional: neg  Cardiovascular: neg Ear/Nose/Throat: neg  Skin: neg Eyes:  Blurred vision Respiratory: neg Gastroitestinal: neg  Hematology/Lymphatic: neg  Endocrine: neg Musculoskeletal:n Walking difficulty, left knee pain, muscle cramps Allergy/Immunology: neg Neurological:  Weakness Psychiatric: Depression Sleep : neg   ALLERGIES: No Known Allergies  HOME MEDICATIONS: Outpatient Prescriptions Prior to Visit  Medication Sig Dispense Refill  . atazanavir-cobicistat (EVOTAZ) 300-150 MG per tablet Take 1 tablet by mouth daily. Swallow whole. Do NOT crush, cut or chew tablet. Take with food. 30 tablet 5  .  benztropine (COGENTIN) 0.5 MG tablet Take 1 tablet (0.5 mg total) by mouth 2 (two) times daily. 60 tablet 8  . emtricitabine-tenofovir (TRUVADA) 200-300 MG per tablet Take 1 tablet by mouth daily. 30 tablet 5  . ibuprofen (ADVIL,MOTRIN) 600 MG tablet Take 600 mg by mouth 2 (two) times daily as needed for moderate pain.    Marland Kitchen LATUDA 40 MG TABS tablet Take 40 mg by mouth daily with breakfast.   2  . sertraline (ZOLOFT) 50 MG tablet Take 1 tablet (50 mg total) by mouth daily. 30 tablet 5  . ENSURE (ENSURE) Take 237 mLs by mouth 2 (two) times daily between meals. (Patient not taking: Reported on 07/15/2015) 237 mL 11   No facility-administered medications prior to visit.    PAST MEDICAL HISTORY: Past Medical History  Diagnosis Date  . Crack cocaine use   . Anxiety   . Depression   . Schizophrenia (HCC)   . Bipolar disorder (HCC)   . 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 Otitis media of left ear 03/15/2013  . Infection   . 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 Tardive dyskinesia 06/13/2014    PAST SURGICAL HISTORY: Past Surgical History  Procedure Laterality Date  . Colposcopy  2009  . Cesarean section N/A 12/24/2013    Procedure: CESAREAN SECTION;  Surgeon: Lesly Dukes, MD;  Location: [facility];  Service: Obstetrics;  Laterality: N/A;  . Dental restoration/extraction with x-ray      FAMILY HISTORY: Family History  Problem Relation Age of Onset  . Diabetes Mother   .  High blood pressure Mother   . Cancer Father     SOCIAL HISTORY: Social History   Social History  . Marital Status: Single    Spouse Name: N/A  . Number of Children: 1  . Years of Education: 11th   Occupational History  . Not on file.   Social History Main Topics  . Smoking status: Current Every Day Smoker -- 0.10 packs/day for 3 years    Types: Cigarettes  . Smokeless tobacco: Never Used     Comment: cutting back, 3 cigs/day  . Alcohol Use: No  . Drug Use: No  . Sexual Activity:    Partners: Male      Birth Control/ Protection: None     Comment: declined condoms   Other Topics Concern  . Not on file   Social History Narrative   Patient lives at home with her partner [name].   Unemployed.   Education 11 th grade.   Right handed.   Patient does not drink caffeine.     PHYSICAL EXAM  Filed Vitals:   07/15/15 0751  BP: 108/70  Pulse: 89  Height:  (1.626 m)  Weight: 101 lb 8 oz (46.04 kg)   Body mass index is 17.41 kg/(m^2). General: The patient is alert and cooperative at the time of the examination. Skin: No significant peripheral edema is noted.  Neurologic Exam Mental status: The patient is alert and oriented x 3 at the time of the examination. The patient has apparent normal recent and remote memory, with an apparently normal attention span and concentration ability.  Cranial nerves: Facial symmetry is present. Speech is normal, no aphasia or dysarthria is noted. PERL.Extraocular movements are full. Visual fields are full. Motor: The patient has good strength in all 4 extremities. Sensory examination: Soft touch sensation is symmetric on the face, arms, and legs. Coordination: The patient has good finger-nose-finger and heel-to-shin bilaterally. Choreoathetoid movements are noted projecting in both upper extremities. Gait and station: The patient has a slightly wide-based gait. Tandem gait is mildly unsteady. Patient has an obvious limp when walking.  Romberg is negative.  She ambulated with the assistance of 1 standby assist. Reflexes: Deep tendon reflexes are symmetric upper and lower. DIAGNOSTIC DATA (LABS, IMAGING, TESTING) - I reviewed patient records, labs, notes, testing and imaging myself where available.  Lab Results  Component Value Date   WBC 4.5 11/27/2014   HGB 12.4 11/27/2014   HCT 36.4 11/27/2014   MCV 81.3 11/27/2014   PLT 214 11/27/2014      Component Value Date/Time   NA 138 11/27/2014 1208   K 3.6 11/27/2014 1208   CL 106  11/27/2014 1208   CO2 24 11/27/2014 1208   GLUCOSE 65* 11/27/2014 1208   BUN 12 11/27/2014 1208   CREATININE 0.84 11/27/2014 1208   CREATININE 0.93 07/20/2014 1537   CALCIUM 8.3* 11/27/2014 1208   PROT 7.4 11/27/2014 1208   ALBUMIN 4.0 11/27/2014 1208   AST 18 11/27/2014 1208   ALT 14 11/27/2014 1208   ALKPHOS 84 11/27/2014 1208   BILITOT 1.1 11/27/2014 1208   GFRNONAA >89 11/27/2014 1208   GFRNONAA 82* 07/20/2014 1537   GFRAA >89 11/27/2014 1208   GFRAA >90 07/20/2014 1537    ASSESSMENT AND PLAN  29 y.o. 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 Here to  follow up. She also has worsening of her left knee pain.  Will refer to ortho for knee pain left greater than right  Contact#[phone] Molly MaduroRobert or [phone] Continue Cogentin F/U in 4 months Nilda RiggsNancy Carolyn Shamir Tuzzolino, [facility] CenterGNP, [facility] LLCBC, APRN  [facility] [address], [address] [place], [place] [address] [phone]

## 2015-07-15 NOTE — Progress Notes (Signed)
I have read the note, and I agree with the clinical assessment and plan.  Alejo Beamer KEITH   

## 2015-07-15 NOTE — Patient Instructions (Addendum)
Will refer to ortho for knee pain left greater than right  Contact#[phone] Molly MaduroRobert or [phone] Continue Cogentin F/U in 4 months

## 2015-07-24 ENCOUNTER — Telehealth: Payer: Self-pay | Admitting: *Deleted

## 2015-07-26 ENCOUNTER — Encounter (HOSPITAL_COMMUNITY): Payer: Self-pay

## 2015-07-26 ENCOUNTER — Emergency Department (HOSPITAL_COMMUNITY)
Admission: EM | Admit: 2015-07-26 | Discharge: 2015-07-26 | Disposition: A | Discharge status: Home / Self Care | Payer: Medicaid Other | Attending: Emergency Medicine | Admitting: Emergency Medicine

## 2015-07-26 ENCOUNTER — Emergency Department (HOSPITAL_COMMUNITY): Payer: Medicaid Other

## 2015-07-26 DIAGNOSIS — F319 Bipolar disorder, unspecified: Secondary | ICD-10-CM | POA: Diagnosis not present

## 2015-07-26 DIAGNOSIS — S8992XA Unspecified injury of left lower leg, initial encounter: Secondary | ICD-10-CM | POA: Diagnosis present

## 2015-07-26 DIAGNOSIS — F419 Anxiety disorder, unspecified: Secondary | ICD-10-CM | POA: Insufficient documentation

## 2015-07-26 DIAGNOSIS — Z21 Asymptomatic human immunodeficiency virus [HIV] infection status: Secondary | ICD-10-CM | POA: Diagnosis not present

## 2015-07-26 DIAGNOSIS — Z79899 Other long term (current) drug therapy: Secondary | ICD-10-CM | POA: Insufficient documentation

## 2015-07-26 DIAGNOSIS — G219 Secondary parkinsonism, unspecified: Secondary | ICD-10-CM | POA: Insufficient documentation

## 2015-07-26 DIAGNOSIS — Y9301 Activity, walking, marching and hiking: Secondary | ICD-10-CM | POA: Insufficient documentation

## 2015-07-26 DIAGNOSIS — F17210 Nicotine dependence, cigarettes, uncomplicated: Secondary | ICD-10-CM | POA: Diagnosis not present

## 2015-07-26 DIAGNOSIS — Z8619 Personal history of other infectious and parasitic diseases: Secondary | ICD-10-CM | POA: Diagnosis not present

## 2015-07-26 DIAGNOSIS — W01198A Fall on same level from slipping, tripping and stumbling with subsequent striking against other object, initial encounter: Secondary | ICD-10-CM | POA: Insufficient documentation

## 2015-07-26 DIAGNOSIS — F209 Schizophrenia, unspecified: Secondary | ICD-10-CM | POA: Diagnosis not present

## 2015-07-26 DIAGNOSIS — G8929 Other chronic pain: Secondary | ICD-10-CM | POA: Insufficient documentation

## 2015-07-26 DIAGNOSIS — S80212A Abrasion, left knee, initial encounter: Secondary | ICD-10-CM | POA: Insufficient documentation

## 2015-07-26 DIAGNOSIS — Y9289 Other specified places as the place of occurrence of the external cause: Secondary | ICD-10-CM | POA: Diagnosis not present

## 2015-07-26 DIAGNOSIS — Y998 Other external cause status: Secondary | ICD-10-CM | POA: Insufficient documentation

## 2015-07-26 DIAGNOSIS — M25562 Pain in left knee: Secondary | ICD-10-CM

## 2015-07-26 NOTE — Discharge Instructions (Signed)
Please follow up with orthopedics for further evaluation and management

## 2015-07-26 NOTE — ED Provider Notes (Signed)
CSN: [id number]     Arrival date & time 07/26/15  0604 History   First MD Initiated Contact with Patient 07/26/15 [phone]     Chief Complaint  Patient presents with  . Leg Pain   HPI   29 year old female presents today with left knee pain. Patient reports that she was walking to the bus stop this morning when her left knee gave out. She reports a significant past medical history of the same, reports that she seen a neurologist for the pain, with the referral to orthopedist. She notes that when she fell today she landed on her hands bilaterally in her left knee causing worsening left knee pain. She reports she was able to ambulate after the accident him a with worse left knee pain. She denies any loss of distal sensation strength or motor function. Patient denies any other injuries, denies pain to her hand specifically. Patient reports she uses ibuprofen at home as needed for the pain, has not taken any today.   Past Medical History  Diagnosis Date  . Crack cocaine use   . Anxiety   . Depression   . Schizophrenia (HCC)   . Bipolar disorder (HCC)   . 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 Otitis media of left ear 03/15/2013  . Infection   . 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 Tardive dyskinesia 06/13/2014   Past Surgical History  Procedure Laterality Date  . Colposcopy  2009  . Cesarean section N/A 12/24/2013    Procedure: CESAREAN SECTION;  Surgeon: Lesly Dukes, MD;  Location: [facility];  Service: Obstetrics;  Laterality: N/A;  . Dental restoration/extraction with x-ray     Family History  Problem Relation Age of Onset  . Diabetes Mother   . High blood pressure Mother   . Cancer Father    Social History  Substance Use Topics  . Smoking status: Current Every Day Smoker -- 0.10 packs/day for 3 years    Types: Cigarettes  . Smokeless tobacco: Never Used     Comment: cutting back, 3 cigs/day  . Alcohol Use: No   OB History    Gravida Para Term Preterm AB TAB SAB Ectopic Multiple  Living   0 1 0 0 2     Review of Systems  All other systems reviewed and are negative.   Allergies  Review of patient's allergies indicates no known allergies.  Home Medications   Prior to Admission medications   Medication Sig Start Date End Date Taking? Authorizing Provider  atazanavir-cobicistat (EVOTAZ) 300-150 MG per tablet Take 1 tablet by mouth daily. Swallow whole. Do NOT crush, cut or chew tablet. Take with food. 11/27/14  Yes Gardiner Barefoot, MD  benztropine (COGENTIN) 0.5 MG tablet Take 1 tablet (0.5 mg total) by mouth 2 (two) times daily. 03/14/15  Yes Nilda Riggs, NP  emtricitabine-tenofovir (TRUVADA) 200-300 MG per tablet Take 1 tablet by mouth daily. 11/27/14  Yes Gardiner Barefoot, MD  ENSURE (ENSURE) Take 237 mLs by mouth 2 (two) times daily between meals. 03/08/15  Yes Judyann Munson, MD  ibuprofen (ADVIL,MOTRIN) 600 MG tablet Take 600 mg by mouth 2 (two) times daily as needed for moderate pain.   Yes Historical Provider, MD  LATUDA 40 MG TABS tablet Take 40 mg by mouth daily with breakfast.  07/03/14  Yes Historical Provider, MD  sertraline (ZOLOFT) 50 MG tablet Take 1 tablet (50 mg total) by mouth daily. 08/06/14  Yes Judyann Munson,  MD   BP 116/79 mmHg  Pulse 99  Temp(Src) 98.1 F (36.7 C) (Oral)  Resp 14  Ht 5\' 4"  (1.626 m)  Wt 47.174 kg  BMI 17.84 kg/m2  SpO2 100%  LMP 05/27/2015   Physical Exam  Constitutional: She is oriented to person, place, and time. She appears well-developed and well-nourished.  HENT:  Head: Normocephalic and atraumatic.  Eyes: Conjunctivae are normal. Pupils are equal, round, and reactive to light. Right eye exhibits no discharge. Left eye exhibits no discharge. No scleral icterus.  Neck: Normal range of motion. No JVD present. No tracheal deviation present.  Pulmonary/Chest: Effort normal. No stridor.  Musculoskeletal:  External inspection shows minor superficial abrasion to the left anterior knee, tenderness to the  knee diffusely, negative Lachman's, negative valgus varus, negative posterior drawer, negative ballottement or patellar grind. Both right and left patella anatomically medial, equal bilateral. Deep flexion shows medial tracking with normal anatomical alignment in flexion. No obvious joint swelling noted. Distal sensation and strength or motor function intact  Neurological: She is alert and oriented to person, place, and time. Coordination normal.  Psychiatric: She has a normal mood and affect. Her behavior is normal. Judgment and thought content normal.  Nursing note and vitals reviewed.   ED Course  Procedures (including critical care time) Labs Review Labs Reviewed - No data to display  Imaging Review Dg Knee Complete 4 Views Left  07/26/2015  CLINICAL DATA:  Pain following fall EXAM: LEFT KNEE - COMPLETE 4+ VIEW COMPARISON:  None. FINDINGS: Frontal, lateral, and bilateral oblique views were obtained. There is no fracture or dislocation. There is evidence a degree of medial subluxation of the patella. There is no appreciable joint effusion. Joint spaces appear intact. No erosive change. IMPRESSION: Evidence of a degree of medial patellar subluxation. No frank dislocation. No fracture or joint effusion. No appreciable arthropathy. Electronically Signed   By: Bretta BangWilliam  Woodruff III M.D.   On: 07/26/2015 07:03   I have personally reviewed and evaluated these images and lab results as part of my medical decision-making.   EKG Interpretation None      MDM   Final diagnoses:  Knee pain, acute, left    Labs:  Imaging: DG knee complete left  Consults:  Therapeutics:  Discharge Meds:   Assessment/Plan: 29 year old female presents today after mechanical fall. Chronic knee pain worsened after the fall. Low suspicion for any acute intra-articular or bony involvement. Patient requesting x-ray, she does have a minor superficial abrasion. Shows subluxation medially of the left patella.  Physical exam shows medial tracking with no dislocation.  She will receive an x-ray here in the ED, Ace wrap, follow-up with orthopedist as previously directed. Continue using ibuprofen as previously prescribed.         Eyvonne MechanicJeffrey Kennen Stammer, PA-C 07/26/15 1552  Layla MawKristen N Ward, DO 07/31/15 [phone]

## 2015-07-26 NOTE — ED Notes (Signed)
Pt comes via [facility], was walking down the road, pt L leg locked up, pt has hx of this, pt fell forward and caught self and needed assistance getting up.

## 2015-07-30 ENCOUNTER — Ambulatory Visit: Payer: Medicaid Other | Admitting: Internal Medicine

## 2015-07-30 NOTE — Telephone Encounter (Signed)
I called and spoke to [place] and she spoke to them in referrals and they will proceed with referral to orthopedic surgeon.

## 2015-08-07 ENCOUNTER — Telehealth: Payer: Self-pay | Admitting: *Deleted

## 2015-08-07 NOTE — Telephone Encounter (Signed)
I called pt and was not able to LM, re: referral to ortho by her pcp.  (following up on this from previous phone note).

## 2015-08-14 ENCOUNTER — Ambulatory Visit (INDEPENDENT_AMBULATORY_CARE_PROVIDER_SITE_OTHER): Payer: Medicaid Other | Admitting: Internal Medicine

## 2015-08-14 ENCOUNTER — Encounter: Payer: Self-pay | Admitting: Internal Medicine

## 2015-08-14 VITALS — BP 116/70 | HR 80 | Temp 97.4°F | Wt 105.0 lb

## 2015-08-14 DIAGNOSIS — B20 Human immunodeficiency virus [HIV] disease: Secondary | ICD-10-CM

## 2015-08-14 DIAGNOSIS — F209 Schizophrenia, unspecified: Secondary | ICD-10-CM

## 2015-08-14 DIAGNOSIS — M25562 Pain in left knee: Secondary | ICD-10-CM

## 2015-08-14 DIAGNOSIS — G2401 Drug induced subacute dyskinesia: Secondary | ICD-10-CM | POA: Diagnosis not present

## 2015-08-14 NOTE — Progress Notes (Signed)
Patient ID: Tamara Briggs, female   DOB: 1986-02-24, 29 y.o.   MRN: [id number]       Patient ID: Tamara Briggs, female   DOB: 07-28-1986, 29 y.o.   MRN: [id number]  HPI 29yo F with hiv disease, schizophrenia. CD 4 count 740/VL<20 on truvada-evotaz. At last visit 1 month ago, she was having difficulty with left knee 2/2 trauma, fell onto her knee cap after feeling usteady.  There was concern that her medications was causing sedation. Since then,h er cogentin decreased to , and her latuda decreased to . Taking muscle relaxant for knee pain if needed. Overall she is feeling much better  Outpatient Encounter Prescriptions as of 08/14/2015  Medication Sig  . atazanavir-cobicistat (EVOTAZ) 300-150 MG per tablet Take 1 tablet by mouth daily. Swallow whole. Do NOT crush, cut or chew tablet. Take with food.  . benztropine (COGENTIN) 0.5 MG tablet Take 1 tablet (0.5 mg total) by mouth 2 (two) times daily.  Marland Kitchen emtricitabine-tenofovir (TRUVADA) 200-300 MG per tablet Take 1 tablet by mouth daily.  Marland Kitchen ENSURE (ENSURE) Take 237 mLs by mouth 2 (two) times daily between meals.  Marland Kitchen ibuprofen (ADVIL,MOTRIN) 600 MG tablet Take 600 mg by mouth 2 (two) times daily as needed for moderate pain.  Marland Kitchen LATUDA 40 MG TABS tablet Take 40 mg by mouth daily with breakfast.   . sertraline (ZOLOFT) 50 MG tablet Take 1 tablet (50 mg total) by mouth daily.   No facility-administered encounter medications on file as of 08/14/2015.     Patient Active Problem List   Diagnosis Date Noted  . Abnormality of gait 07/15/2015  . Knee pain, left 07/15/2015  . 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 Tardive dyskinesia 06/13/2014  . S/P C-section 12/25/2013  . Preterm premature rupture of membranes (PPROM) delivered, current hospitalization 12/14/2013  . Normocytic anemia 12/14/2013  . 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 Abnormal findings on antenatal screening 10/27/2013  . Maternal HIV positive complicating pregnancy in  second trimester, antepartum (HCC) 10/18/2013  . Schizophrenia (HCC) 10/18/2013  . Pyelonephritis 07/21/2013  . Protein-calorie malnutrition, severe (HCC) 03/16/2013  . Tobacco abuse 03/15/2013  . h/o crack Cocaine abuse 03/15/2013  . h/o oral Thrush 03/15/2013  . HIV (human immunodeficiency virus infection) (HCC) 07/27/2011  . Chlamydia and Gonorrhea 07/27/2011  . H/O abnormal cervical Papanicolaou smear 07/26/2008     There are no preventive care reminders to display for this patient.   Review of Systems Left knee pain otherwise 10 point ros is negative Physical Exam   BP 116/70 mmHg  Pulse 80  Temp(Src) 97.4 F (36.3 C) (Oral)  Wt 105 lb (47.628 kg)  Breastfeeding? No Physical Exam  Constitutional:  oriented to person, place, and time. appears well-developed and well-nourished. No distress.  HENT: [place]/AT, PERRLA, no scleral icterus. TM clears Mouth/Throat: Oropharynx is clear and moist. No oropharyngeal exudate.  Cardiovascular: Normal rate, regular rhythm and normal heart sounds. Exam reveals no gallop and no friction rub.  No murmur heard.  Pulmonary/Chest: Effort normal and breath sounds normal. No respiratory distress.  has no wheezes.  Neck = supple, no nuchal rigidity Lymphadenopathy: no cervical adenopathy. No axillary adenopathy Neurological: alert and oriented to person, place, and time.  Skin: Skin is warm and dry. No rash noted. No erythema.  Psychiatric: a normal mood and affect.  behavior is normal.   Lab Results  Component Value Date   CD4TCELL 31* 06/11/2015   Lab Results  Component Value Date  CD4TABS 740 06/11/2015   CD4TABS 460 11/27/2014   CD4TABS 610 08/28/2014   Lab Results  Component Value Date   HIV1RNAQUANT <20 06/11/2015   Lab Results  Component Value Date   HEPBSAB POS* 07/09/2011   No results found for: RPR  CBC Lab Results  Component Value Date   WBC 4.5 11/27/2014   RBC 4.48 11/27/2014   HGB 12.4 11/27/2014   HCT 36.4  11/27/2014   PLT 214 11/27/2014   MCV 81.3 11/27/2014   MCH 27.7 11/27/2014   MCHC 34.1 11/27/2014   RDW 14.3 11/27/2014   LYMPHSABS 1.5 11/27/2014   MONOABS 0.3 11/27/2014   EOSABS 0.1 11/27/2014   BASOSABS 0.0 11/27/2014   BMET Lab Results  Component Value Date   NA 138 11/27/2014   K 3.6 11/27/2014   CL 106 11/27/2014   CO2 24 11/27/2014   GLUCOSE 65* 11/27/2014   BUN 12 11/27/2014   CREATININE 0.84 11/27/2014   CALCIUM 8.3* 11/27/2014   GFRNONAA >89 11/27/2014   GFRAA >89 11/27/2014     Assessment and Plan  hiv disease = well controlled, continue on current regimen  Left knee pain = appears improved since last visit, though she is still needing to use muscle relaxant every once in a while  Schizophrenia = tolerating the lower dose of latuda  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 See back in 3 months

## 2015-08-29 ENCOUNTER — Ambulatory Visit: Payer: Self-pay | Admitting: *Deleted

## 2015-08-29 DIAGNOSIS — B20 Human immunodeficiency virus [HIV] disease: Secondary | ICD-10-CM

## 2015-09-02 ENCOUNTER — Ambulatory Visit: Payer: Medicaid Other

## 2015-09-02 ENCOUNTER — Other Ambulatory Visit: Payer: Self-pay | Admitting: Internal Medicine

## 2015-09-04 ENCOUNTER — Ambulatory Visit: Payer: Medicaid Other

## 2015-09-11 ENCOUNTER — Ambulatory Visit: Payer: Medicaid Other

## 2015-09-13 NOTE — Progress Notes (Signed)
Patient ID: Tamara Briggs, female   DOB: [birth year], 30 y.o.   MRN: [id number] RN meet with the patient in her home. Patient stated everything is going well for her without any complaints. Patient has consistently taken her medications and now walks without the assistance of a cane. Tardive dyskinesia and secondary parkinson's are currently managed effectively with current regimen. Patient feels she has a stable home with viral load undetectable for the last 3 months. At this time the patient feels ready to be discharged from [facility] with Goals met. Reviewed stated Plan of Care goals with the patient. Plan of Care goals state "RN will coordinate with other community resources including Mental Health for the patient to receive mental health services so she can began to meet her goal of HIV medication compliance". Patient and RN both feel that this goals has been met and are comfortable with discharge.  RN verbally spoke with Dr Baxter Flattery on 09/13/15,making her aware that the patient has meet goals and will be discharged from [facility] on 08/29/2015

## 2015-09-13 NOTE — Patient Instructions (Signed)
RN instructed the patient and her spouse(Bobby) to please call me if she feels that she is having trouble taking her medications. RN also advised that if she is having struggles with housing or basic needs to please reach out to me. Patient verbalized understanding by nodding her head and stating "OK, I will, thanks.

## 2015-09-20 ENCOUNTER — Ambulatory Visit: Payer: Medicaid Other

## 2015-09-20 NOTE — Telephone Encounter (Signed)
Purpose of this communication is to relay that that the set frequency for home visits this week has been changed due to a missed visit. Communication has been made with the patient to ensure needs have been met and to offer a home visit. At this time a return call has not been received before the business week is out. Next week the [facility] Nurse plans to continue contact with the patient to offer any services or attempt to address any needs the patient expresses that is reasonable.   RN received VO from Dr Baxter Flattery stating RN may make one additional visit to re-evaluate patient for a Discharge/additional orders to Tamara Briggs

## 2015-10-08 IMAGING — US US OB COMP LESS 14 WK
1 series · 14 of 26 positions shown · non-contrast
Comparison: None.

CLINICAL DATA: EGA, if H teeth, right lower quadrant pain, HIV
positive. Estimated gestational age by LMP is 20 weeks 5 days.
Quantitative beta HCG is not available.

EXAM:
OBSTETRIC <14 WK ULTRASOUND
TECHNIQUE: Transabdominal ultrasound was performed for evaluation of the
gestation as well as the maternal uterus and adnexal regions.

[Series 1: us ob transvaginal · 26 acquisitions, 14 frames shown]
[im 1/26]
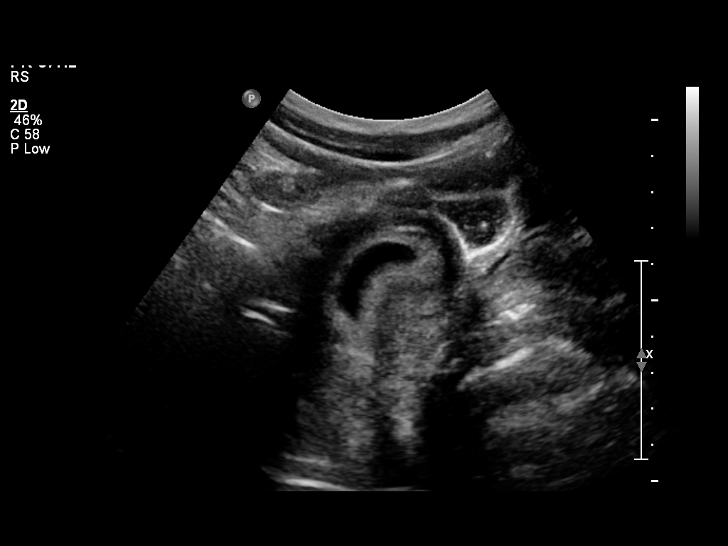
[im 3/26]
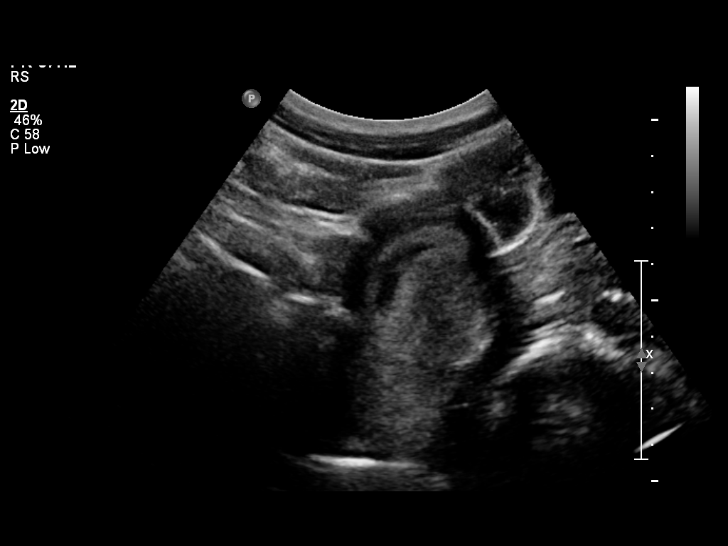
[im 5/26]
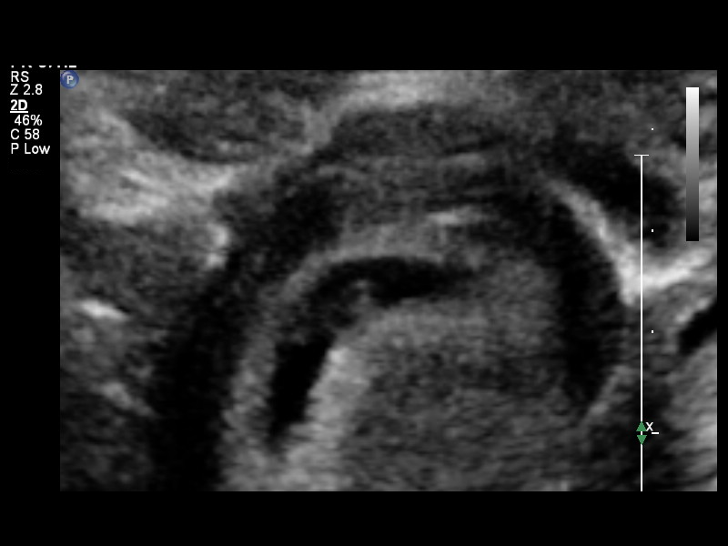
[im 7/26]
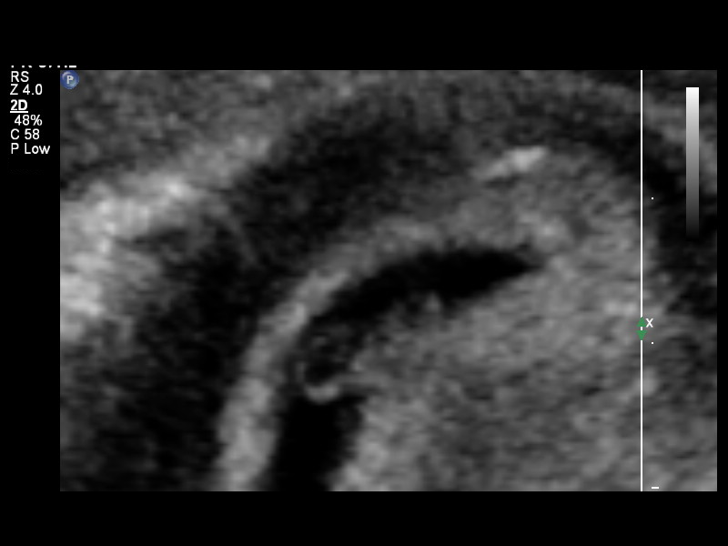
[im 9/26]
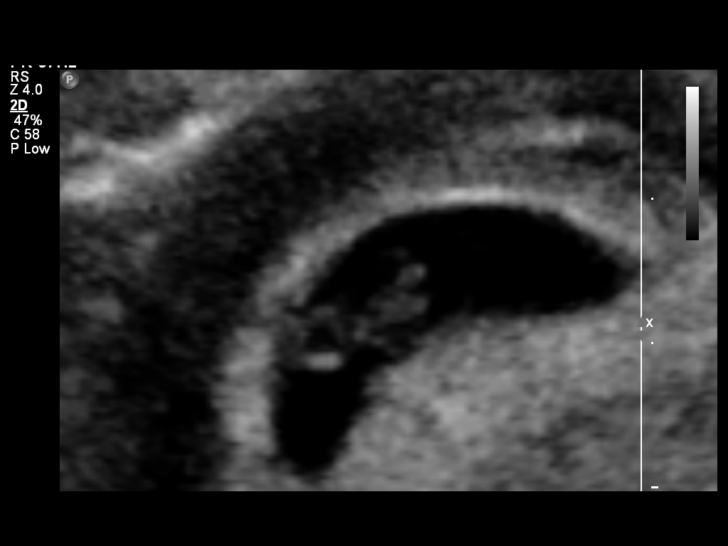
[im 11/26]
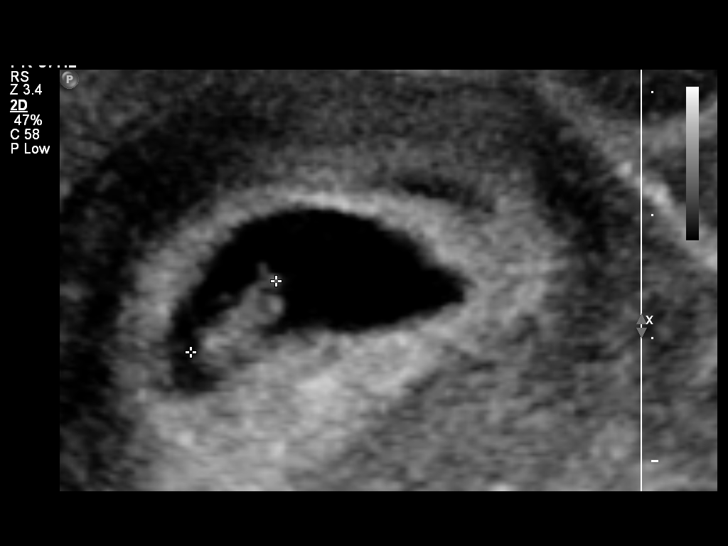
[im 13/26]
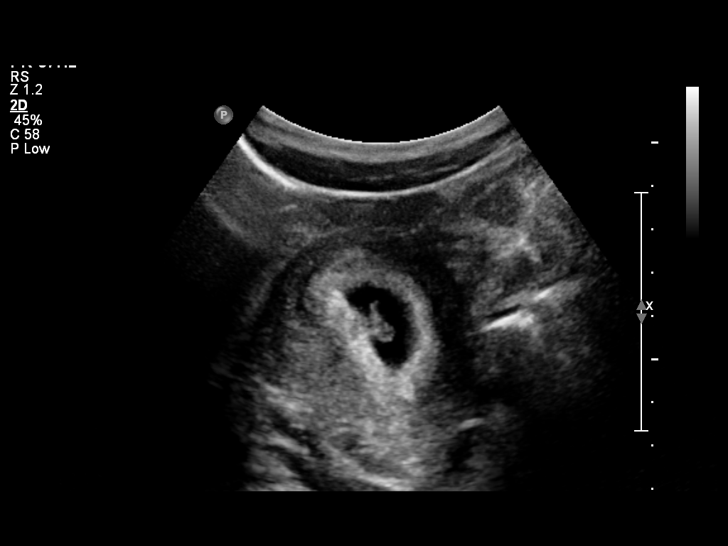
[im 14/26]
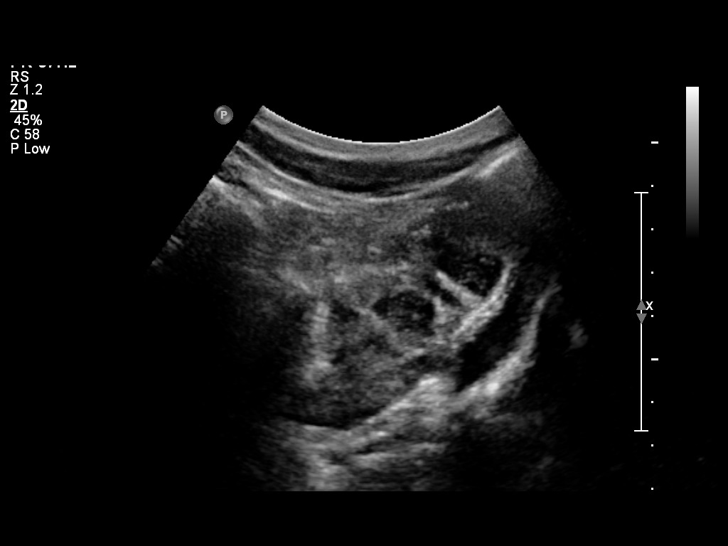
[im 16/26]
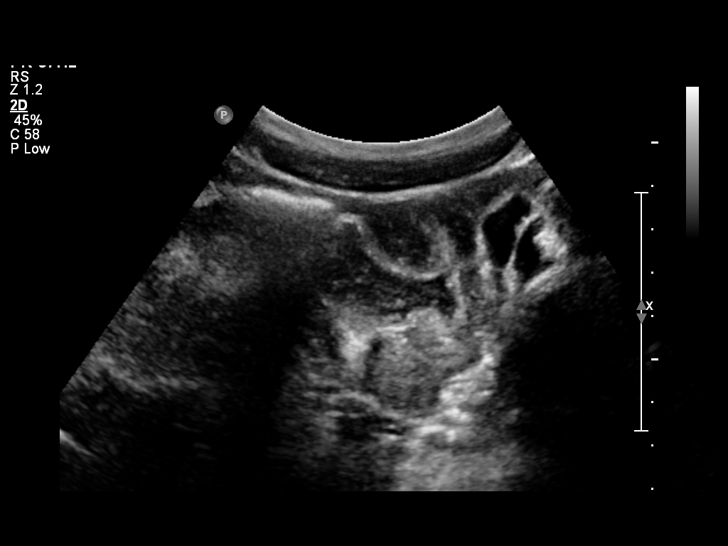
[im 18/26]
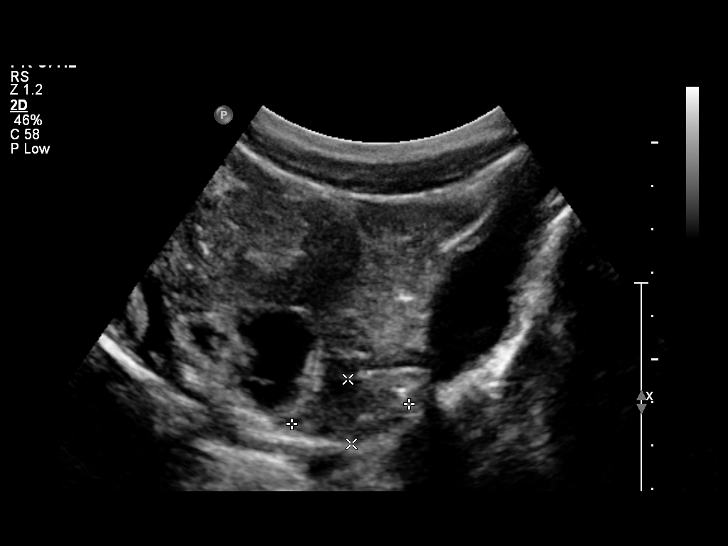
[im 20/26]
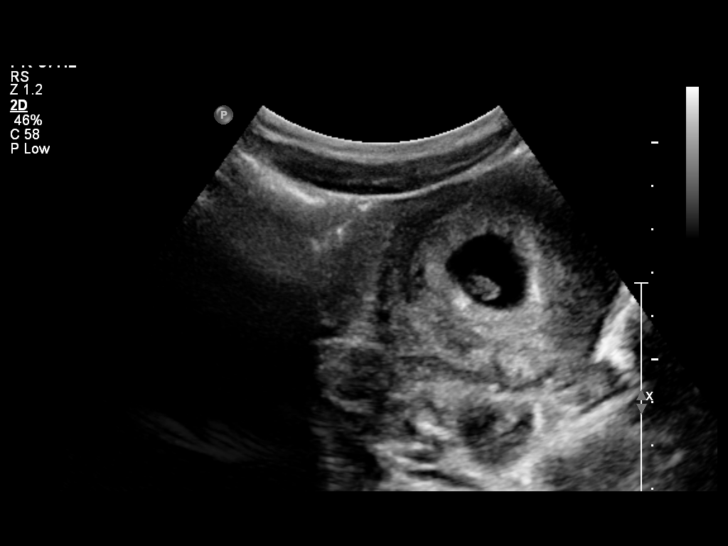
[im 22/26]
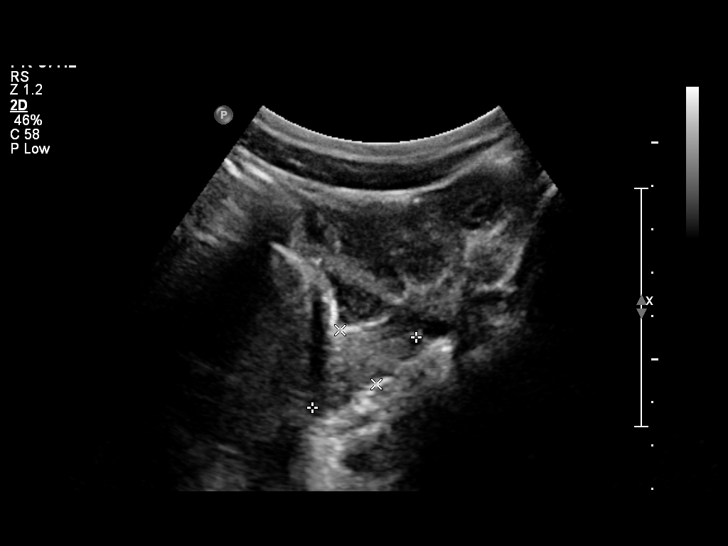
[im 24/26]
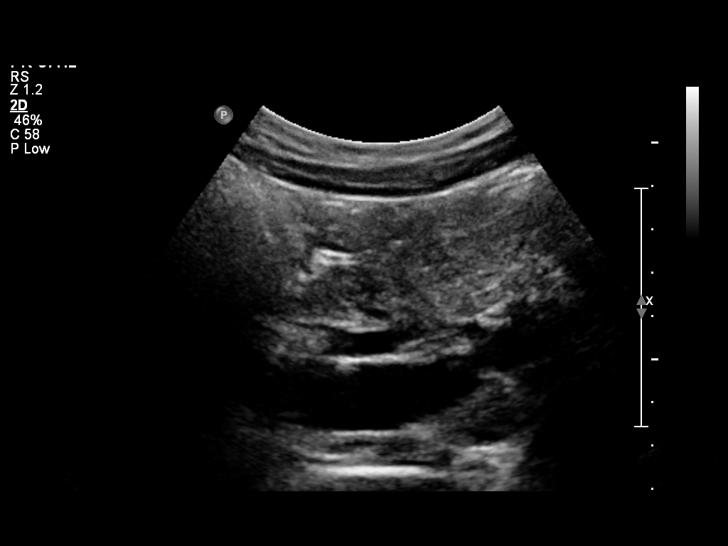
[im 26/26]
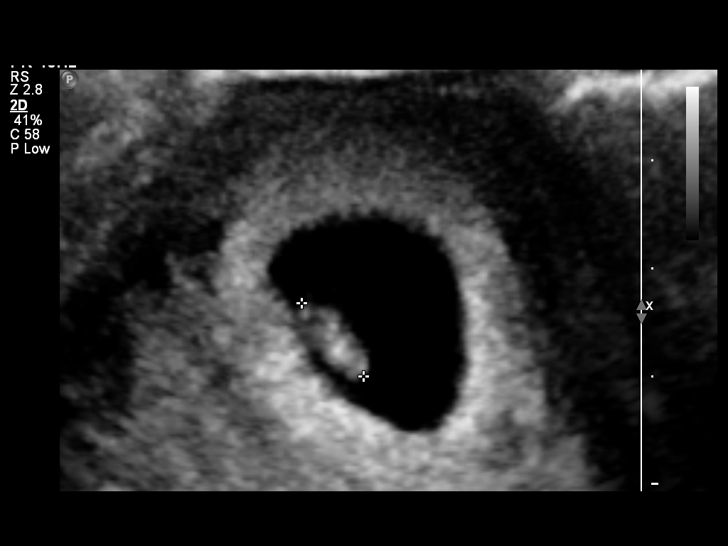

[14 of 26 positions shown; findings below may reference images not displayed]

FINDINGS: Intrauterine gestational sac: A single intrauterine gestational sac
is visualized.

Yolk sac:  Yolk sac is visualized.

Embryo:  The fetal pole is visualized.

Cardiac Activity: Fetal cardiac activity is observed.

Heart Rate: 132 bpm

CRL:   9  mm   6 w 6 d                  US EDC: 03/10/2014

Maternal uterus/adnexae: The uterus is anteverted. No myometrial
masses are demonstrated. Both ovaries are visualized and appear
symmetrical. No abnormal adnexal masses. Images are obtained of the
area of pain on the right, demonstrating no focal lesion. No
subchorionic hemorrhage. No free pelvic fluid collections.
IMPRESSION: Single intrauterine pregnancy. Estimated gestational age by
crown-rump length is 6 weeks 6 days. No abnormal adnexal masses.

## 2015-10-31 ENCOUNTER — Other Ambulatory Visit: Payer: Medicaid Other

## 2015-10-31 DIAGNOSIS — B20 Human immunodeficiency virus [HIV] disease: Secondary | ICD-10-CM

## 2015-10-31 DIAGNOSIS — Z79899 Other long term (current) drug therapy: Secondary | ICD-10-CM

## 2015-10-31 DIAGNOSIS — Z113 Encounter for screening for infections with a predominantly sexual mode of transmission: Secondary | ICD-10-CM

## 2015-10-31 LAB — COMPREHENSIVE METABOLIC PANEL
ALBUMIN: 3.9 g/dL (ref 3.6–5.1)
ALK PHOS: 71 U/L (ref 33–115)
ALT: 11 U/L (ref 6–29)
AST: 18 U/L (ref 10–30)
BILIRUBIN TOTAL: 0.6 mg/dL (ref 0.2–1.2)
BUN: 21 mg/dL (ref 7–25)
CALCIUM: 8.8 mg/dL (ref 8.6–10.2)
CO2: 28 mmol/L (ref 20–31)
Chloride: 106 mmol/L (ref 98–110)
Creat: 0.98 mg/dL (ref 0.50–1.10)
Glucose, Bld: 91 mg/dL (ref 65–99)
Potassium: 4.1 mmol/L (ref 3.5–5.3)
Sodium: 141 mmol/L (ref 135–146)
Total Protein: 7.1 g/dL (ref 6.1–8.1)

## 2015-10-31 LAB — CBC WITH DIFFERENTIAL/PLATELET
Basophils Absolute: 0 10*3/uL (ref 0.0–0.1)
Basophils Relative: 0 % (ref 0–1)
Eosinophils Absolute: 0.1 10*3/uL (ref 0.0–0.7)
Eosinophils Relative: 2 % (ref 0–5)
HEMATOCRIT: 38.6 % (ref 36.0–46.0)
HEMOGLOBIN: 12.7 g/dL (ref 12.0–15.0)
LYMPHS PCT: 37 % (ref 12–46)
Lymphs Abs: 1.6 10*3/uL (ref 0.7–4.0)
MCH: 27.8 pg (ref 26.0–34.0)
MCHC: 32.9 g/dL (ref 30.0–36.0)
MCV: 84.5 fL (ref 78.0–100.0)
MONO ABS: 0.4 10*3/uL (ref 0.1–1.0)
MPV: 9.1 fL (ref 8.6–12.4)
Monocytes Relative: 9 % (ref 3–12)
Neutro Abs: 2.2 10*3/uL (ref 1.7–7.7)
Neutrophils Relative %: 52 % (ref 43–77)
Platelets: 182 10*3/uL (ref 150–400)
RBC: 4.57 MIL/uL (ref 3.87–5.11)
RDW: 13.7 % (ref 11.5–15.5)
WBC: 4.2 10*3/uL (ref 4.0–10.5)

## 2015-10-31 LAB — LIPID PANEL
CHOL/HDL RATIO: 2 ratio (ref <=5.0)
Cholesterol: 127 mg/dL (ref 125–200)
HDL: 63 mg/dL (ref >=46)
LDL CALC: 45 mg/dL (ref <130)
Triglycerides: 97 mg/dL (ref <150)
VLDL: 19 mg/dL (ref <30)

## 2015-11-01 LAB — T-HELPER CELL (CD4) - (RCID CLINIC ONLY)
CD4 % Helper T Cell: 35 % (ref 33–55)
CD4 T CELL ABS: 600 /uL (ref 400–2700)

## 2015-11-01 LAB — RPR

## 2015-11-03 LAB — HIV-1 RNA QUANT-NO REFLEX-BLD
HIV 1 RNA QUANT: 188 {copies}/mL — AB (ref <20)
HIV-1 RNA Quant, Log: 2.27 Log copies/mL — ABNORMAL HIGH (ref <1.30)

## 2015-11-12 ENCOUNTER — Encounter: Payer: Self-pay | Admitting: Nurse Practitioner

## 2015-11-12 ENCOUNTER — Ambulatory Visit: Payer: Medicaid Other | Admitting: Internal Medicine

## 2015-11-12 ENCOUNTER — Ambulatory Visit (INDEPENDENT_AMBULATORY_CARE_PROVIDER_SITE_OTHER): Payer: Medicaid Other | Admitting: Nurse Practitioner

## 2015-11-12 VITALS — BP 100/68 | HR 66 | Resp 16 | Ht 64.0 in | Wt 100.0 lb

## 2015-11-12 DIAGNOSIS — G2119 Other drug induced secondary parkinsonism: Secondary | ICD-10-CM | POA: Diagnosis not present

## 2015-11-12 DIAGNOSIS — G2401 Drug induced subacute dyskinesia: Secondary | ICD-10-CM | POA: Diagnosis not present

## 2015-11-12 DIAGNOSIS — R269 Unspecified abnormalities of gait and mobility: Secondary | ICD-10-CM | POA: Diagnosis not present

## 2015-11-12 MED ORDER — BENZTROPINE MESYLATE 0.5 MG PO TABS: 0.5000 mg | ORAL_TABLET | Freq: Two times a day (BID) | ORAL | Status: DC

## 2015-11-12 NOTE — Progress Notes (Signed)
[facility]  PATIENT: Tamara Briggs DOB: 04/21/1986   REASON FOR VISIT: Follow-up for abnormality of gait, tardive dyskinesia secondary parkinsonism HISTORY FROM: Patient    HISTORY OF PRESENT ILLNESS:Tamara Briggs is a 30 year old right-handed black female with a history of secondary parkinsonism. 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 This is felt to be due to [facility] Ascatuda which has been discontinued . She has responded well to Cogentin .The patient has come in today indicating that her walking is better no falls since last seen. She ambulates with quad cane.  She comes to this office for further evaluation and refills.   REVIEW OF SYSTEMS: Full 14 system review of systems performed and notable only for those listed, all others are neg:  Constitutional: neg  Cardiovascular: neg Ear/Nose/Throat: neg  Skin: neg Eyes: neg Respiratory: neg Gastroitestinal: neg  Hematology/Lymphatic: neg  Endocrine: neg Musculoskeletal:gait abnormality Allergy/Immunology: neg Neurological: neg Psychiatric: neg Sleep : neg   ALLERGIES: No Known Allergies  HOME MEDICATIONS: Outpatient Prescriptions Prior to Visit  Medication Sig Dispense Refill  . atazanavir-cobicistat (EVOTAZ) 300-150 MG per tablet Take 1 tablet by mouth daily. Swallow whole. Do NOT crush, cut or chew tablet. Take with food. 30 tablet 5  . benztropine (COGENTIN) 0.5 MG tablet Take 1 tablet (0.5 mg total) by mouth 2 (two) times daily. 60 tablet 8  . emtricitabine-tenofovir (TRUVADA) 200-300 MG per tablet Take 1 tablet by mouth daily. 30 tablet 5  . ENSURE (ENSURE) Take 237 mLs by mouth 2 (two) times daily between meals. 237 mL 11  . EVOTAZ 300-150 MG tablet TAKE 1 TABLET BY MOUTH EVERY DAY WITH FOOD 30 tablet 3  . ibuprofen (ADVIL,MOTRIN) 600 MG tablet Take 600 mg by mouth 2 (two) times daily as needed for moderate pain.    Marland Kitchen. LATUDA 40 MG TABS tablet Take 40 mg by  mouth daily with breakfast.   2  . sertraline (ZOLOFT) 50 MG tablet Take 1 tablet (50 mg total) by mouth daily. 30 tablet 5  . TRUVADA 200-300 MG tablet TAKE 1 TABLET BY MOUTH EVERY DAY 30 tablet 3   No facility-administered medications prior to visit.    PAST MEDICAL HISTORY: Past Medical History  Diagnosis Date  . Crack cocaine use   . Anxiety   . Depression   . Schizophrenia (HCC)   . Bipolar disorder (HCC)   . 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 Otitis media of left ear 03/15/2013  . Infection   . 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 Tardive dyskinesia 06/13/2014    PAST SURGICAL HISTORY: Past Surgical History  Procedure Laterality Date  . Colposcopy  2009  . Cesarean section N/A 12/24/2013    Procedure: CESAREAN SECTION;  Surgeon: Lesly DukesKelly H Leggett, MD;  Location: [facility];  Service: Obstetrics;  Laterality: N/A;  . Dental restoration/extraction with x-ray      FAMILY HISTORY: Family History  Problem Relation Age of Onset  . Diabetes Mother   . High blood pressure Mother   . Cancer Father     SOCIAL HISTORY: Social History   Social History  . Marital Status: Single    Spouse Name: N/A  . Number of Children: 1  . Years of Education: 11th   Occupational History  . Not on file.   Social History Main Topics  . Smoking status: Current Every Day Smoker -- 0.10 packs/day for 3 years    Types: Cigarettes  . Smokeless tobacco: Never  Used     Comment: cutting back, 3 cigs/day  . Alcohol Use: No  . Drug Use: No  . Sexual Activity:    Partners: Male    Birth Control/ Protection: None     Comment: declined condoms   Other Topics Concern  . Not on file   Social History Narrative   Patient lives at home with her partner Tamara Briggs.   Unemployed.   Education 11 th grade.   Right handed.   Patient does not drink caffeine.     PHYSICAL EXAM  Filed Vitals:   11/12/15 1310  BP: 100/68  Pulse: 66  Resp: 16  Height:  (1.626 m)  Weight: 100 lb  (45.36 kg)   Body mass index is 17.16 kg/(m^2). General: The patient is alert and cooperative at the time of the examination. Skin: No significant peripheral edema is noted.  Neurologic Exam Mental status: The patient is alert and oriented x 3 at the time of the examination. The patient has apparent normal recent and remote memory, with an apparently normal attention span and concentration ability.  Cranial nerves: Facial symmetry is present. Speech is normal, no aphasia or dysarthria is noted. PERL.Extraocular movements are full. Visual fields are full. Motor: The patient has good strength in all 4 extremities. Sensory examination: Soft touch sensation is symmetric on the face, arms, and legs. Coordination: The patient has good finger-nose-finger and heel-to-shin bilaterally. Choreoathetoid movements are noted  in both upper extremities. Gait and station: The patient has a slightly wide-based gait. Tandem gait is mildly unsteady. Romberg is negative. She ambulated with 4 point cane. Reflexes: Deep tendon reflexes are symmetric upper and lower. DIAGNOSTIC DATA (LABS, IMAGING, TESTING) - I reviewed patient records, labs, notes, testing and imaging myself where available.  Lab Results  Component Value Date   WBC 4.2 10/31/2015   HGB 12.7 10/31/2015   HCT 38.6 10/31/2015   MCV 84.5 10/31/2015   PLT 182 10/31/2015      Component Value Date/Time   NA 141 10/31/2015 1000   K 4.1 10/31/2015 1000   CL 106 10/31/2015 1000   CO2 28 10/31/2015 1000   GLUCOSE 91 10/31/2015 1000   BUN 21 10/31/2015 1000   CREATININE 0.98 10/31/2015 1000   CREATININE 0.93 07/20/2014 1537   CALCIUM 8.8 10/31/2015 1000   PROT 7.1 10/31/2015 1000   ALBUMIN 3.9 10/31/2015 1000   AST 18 10/31/2015 1000   ALT 11 10/31/2015 1000   ALKPHOS 71 10/31/2015 1000   BILITOT 0.6 10/31/2015 1000   GFRNONAA >89 11/27/2014 1208   GFRNONAA 82* 07/20/2014 1537   GFRAA >89 11/27/2014 1208   GFRAA >90 07/20/2014 1537    Lab Results  Component Value Date   CHOL 127 10/31/2015   HDL 63 10/31/2015   LDLCALC 45 10/31/2015   TRIG 97 10/31/2015   CHOLHDL 2.0 10/31/2015    ASSESSMENT AND PLAN 30 y.o. 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 Here to follow up.  Continue Cogentin will refill Continue to use 4 prong cane at all times for safe ambulation F/U in 6 months Nilda Riggs, [facility], [facility], APRN  [facility] [address], [address] [place], [place] [address] [phone]

## 2015-11-12 NOTE — Patient Instructions (Signed)
Continue Cogentin will refill F/U in 6 months

## 2015-11-12 NOTE — Progress Notes (Signed)
I have read the note, and I agree with the clinical assessment and plan.  WILLIS,CHARLES KEITH   

## 2015-11-13 ENCOUNTER — Ambulatory Visit (INDEPENDENT_AMBULATORY_CARE_PROVIDER_SITE_OTHER): Payer: Medicaid Other | Admitting: Internal Medicine

## 2015-11-13 ENCOUNTER — Encounter: Payer: Self-pay | Admitting: Internal Medicine

## 2015-11-13 VITALS — BP 99/64 | HR 60 | Temp 98.4°F | Ht 64.0 in | Wt 97.8 lb

## 2015-11-13 DIAGNOSIS — F209 Schizophrenia, unspecified: Secondary | ICD-10-CM | POA: Diagnosis not present

## 2015-11-13 DIAGNOSIS — R636 Underweight: Secondary | ICD-10-CM | POA: Diagnosis not present

## 2015-11-13 DIAGNOSIS — B20 Human immunodeficiency virus [HIV] disease: Secondary | ICD-10-CM

## 2015-11-13 MED ORDER — EMTRICITABINE-TENOFOVIR AF 200-25 MG PO TABS: 1.0000 | ORAL_TABLET | Freq: Every day | ORAL | Status: DC

## 2015-11-13 MED ORDER — ATAZANAVIR-COBICISTAT 300-150 MG PO TABS: 1.0000 | ORAL_TABLET | Freq: Every day | ORAL | Status: DC

## 2015-11-13 NOTE — Progress Notes (Signed)
RFV: follow up on hiv disease Subjective:    Patient ID: Tamara Briggs, female    DOB: 12-04-1985, 30 y.o.   MRN: [id number]  HPI 29yo F with HIV disease and schizophrenia, CD 4 count 600/ VL 188 currently on truvada/evotaz. Her latuda dose was changed to  daily and noticing less itchiness and less involuntary movement. She states that in the last 30 day she has missed a few doses since there was a delay in delivery of medications from pharmacy, though she mentions more her psych medications. She states that she has not noticed any change in her moods due to lowering of latuda dosing.her knee injury from the winter has now resolved and much improved  She endorses food insecurity. Getting assistance from [facility]  Current Outpatient Prescriptions on File Prior to Visit  Medication Sig Dispense Refill  . atazanavir-cobicistat (EVOTAZ) 300-150 MG per tablet Take 1 tablet by mouth daily. Swallow whole. Do NOT crush, cut or chew tablet. Take with food. 30 tablet 5  . benztropine (COGENTIN) 0.5 MG tablet Take 1 tablet (0.5 mg total) by mouth 2 (two) times daily. 60 tablet 8  . emtricitabine-tenofovir (TRUVADA) 200-300 MG per tablet Take 1 tablet by mouth daily. 30 tablet 5  . ENSURE (ENSURE) Take 237 mLs by mouth 2 (two) times daily between meals. 237 mL 11  . ibuprofen (ADVIL,MOTRIN) 600 MG tablet Take 600 mg by mouth 2 (two) times daily as needed for moderate pain.    Marland Kitchen LATUDA 40 MG TABS tablet Take 40 mg by mouth daily with breakfast.   2  . sertraline (ZOLOFT) 50 MG tablet Take 1 tablet (50 mg total) by mouth daily. 30 tablet 5   No current facility-administered medications on file prior to visit.    Lab Results  Component Value Date   CD4TCELL 35 10/31/2015   CD4TABS 600 10/31/2015     Review of Systems  Constitutional: Negative for fever, chills, diaphoresis, activity change, appetite change, fatigue and unexpected weight change.  HENT: Negative for congestion, sore throat, rhinorrhea,  sneezing, trouble swallowing and sinus pressure.  Eyes: Negative for photophobia and visual disturbance.  Respiratory: Negative for cough, chest tightness, shortness of breath, wheezing and stridor.  Cardiovascular: Negative for chest pain, palpitations and leg swelling.  Gastrointestinal: Negative for nausea, vomiting, abdominal pain, diarrhea, constipation, blood in stool, abdominal distention and anal bleeding.  Genitourinary: Negative for dysuria, hematuria, flank pain and difficulty urinating.  Musculoskeletal: Negative for myalgias, back pain, joint swelling, arthralgias and gait problem.  Skin: Negative for color change, pallor, rash and wound.  Neurological: Negative for dizziness, tremors, weakness and light-headedness.  Hematological: Negative for adenopathy. Does not bruise/bleed easily.  Psychiatric/Behavioral: Negative for behavioral problems, confusion, sleep disturbance, dysphoric mood, decreased concentration and agitation.       Objective:   Physical Exam BP 99/64 mmHg  Pulse 60  Temp(Src) 98.4 F (36.9 C) (Oral)  Ht  (1.626 m)  Wt 97 lb 12.8 oz (44.362 kg)  BMI 16.78 kg/m2  LMP 11/12/2015  Physical Exam  Constitutional:  oriented to person, place, and time. appears well-developed and well-nourished. No distress.  HENT: [place]/AT, PERRLA, no scleral icterus Mouth/Throat: Oropharynx is clear and moist. No oropharyngeal exudate.  Cardiovascular: Normal rate, regular rhythm and normal heart sounds. Exam reveals no gallop and no friction rub.  No murmur heard.  Pulmonary/Chest: Effort normal and breath sounds normal. No respiratory distress.  has no wheezes.  Neck = supple, no nuchal rigidity Lymphadenopathy: no cervical  adenopathy. No axillary adenopathy Neurological: alert and oriented to person, place, and time.  Skin: Skin is warm and dry. No rash noted. No erythema.  Psychiatric: a normal mood and affect.  behavior is normal.        Assessment & Plan:  HIV  disease = she has some low level viremia, possibly due to her missing doses from pharmacy though i am concern that she may not have good adherence of late. she is still on TDF thus will switch her to TAF based regimen. Will change her from truvada to descovy and keep evotaz. I have explained change and showed her the change in pills.  Schizophrenia = continue with following iwht [facility] and latuda 20mg  appears to give her less tardive dyskinesia. Will continue to monitor  Food insecurity = encouraged to continue with working with [facility]. Will watch to any worsening malnutrition.  Underweight = continue with protein supplementation.  Spent 35 min with patient with greater than 50% in counseling on adherence  Will see back in 4-6 wk

## 2015-11-19 ENCOUNTER — Encounter (HOSPITAL_COMMUNITY): Payer: Self-pay | Admitting: Emergency Medicine

## 2015-11-19 ENCOUNTER — Emergency Department (HOSPITAL_COMMUNITY): Payer: Medicaid Other

## 2015-11-19 ENCOUNTER — Emergency Department (HOSPITAL_COMMUNITY)
Admission: EM | Admit: 2015-11-19 | Discharge: 2015-11-19 | Disposition: A | Discharge status: Home / Self Care | Payer: Medicaid Other | Attending: Emergency Medicine | Admitting: Emergency Medicine

## 2015-11-19 DIAGNOSIS — F319 Bipolar disorder, unspecified: Secondary | ICD-10-CM | POA: Diagnosis not present

## 2015-11-19 DIAGNOSIS — Y9389 Activity, other specified: Secondary | ICD-10-CM | POA: Diagnosis not present

## 2015-11-19 DIAGNOSIS — B20 Human immunodeficiency virus [HIV] disease: Secondary | ICD-10-CM | POA: Diagnosis not present

## 2015-11-19 DIAGNOSIS — Z79899 Other long term (current) drug therapy: Secondary | ICD-10-CM | POA: Diagnosis not present

## 2015-11-19 DIAGNOSIS — Y92531 Health care provider office as the place of occurrence of the external cause: Secondary | ICD-10-CM | POA: Insufficient documentation

## 2015-11-19 DIAGNOSIS — S8991XA Unspecified injury of right lower leg, initial encounter: Secondary | ICD-10-CM | POA: Diagnosis not present

## 2015-11-19 DIAGNOSIS — Y998 Other external cause status: Secondary | ICD-10-CM | POA: Diagnosis not present

## 2015-11-19 DIAGNOSIS — Z5900 Homelessness unspecified: Secondary | ICD-10-CM

## 2015-11-19 DIAGNOSIS — F209 Schizophrenia, unspecified: Secondary | ICD-10-CM | POA: Insufficient documentation

## 2015-11-19 DIAGNOSIS — M25561 Pain in right knee: Secondary | ICD-10-CM

## 2015-11-19 DIAGNOSIS — S80212A Abrasion, left knee, initial encounter: Secondary | ICD-10-CM | POA: Insufficient documentation

## 2015-11-19 DIAGNOSIS — W19XXXA Unspecified fall, initial encounter: Secondary | ICD-10-CM

## 2015-11-19 DIAGNOSIS — S8992XA Unspecified injury of left lower leg, initial encounter: Secondary | ICD-10-CM | POA: Diagnosis present

## 2015-11-19 DIAGNOSIS — G219 Secondary parkinsonism, unspecified: Secondary | ICD-10-CM | POA: Diagnosis not present

## 2015-11-19 DIAGNOSIS — F17210 Nicotine dependence, cigarettes, uncomplicated: Secondary | ICD-10-CM | POA: Diagnosis not present

## 2015-11-19 DIAGNOSIS — M25562 Pain in left knee: Secondary | ICD-10-CM

## 2015-11-19 DIAGNOSIS — F419 Anxiety disorder, unspecified: Secondary | ICD-10-CM | POA: Insufficient documentation

## 2015-11-19 DIAGNOSIS — M79661 Pain in right lower leg: Secondary | ICD-10-CM

## 2015-11-19 MED ORDER — IBUPROFEN 200 MG PO TABS
600.0000 mg | ORAL_TABLET | Freq: Once | ORAL | Status: AC
  Administered 2015-11-19: 600 mg via ORAL
  Filled 2015-11-19: qty 3

## 2015-11-19 MED ORDER — IBUPROFEN 600 MG PO TABS: 600.0000 mg | ORAL_TABLET | Freq: Four times a day (QID) | ORAL | Status: DC | PRN
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
## 2015-11-19 NOTE — ED Notes (Signed)
Social worker met with patient for over one hour. Pt was initially uncooperative about possible transport to [facility]. [employer] officer at bedside, pt agreed to transport. Pt was alert and cooperative. Stated that all her belongings are"on the back lawn of the place I lived for the last two years" pt stated that her cane is there at the home also
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
## 2015-11-19 NOTE — Discharge Instructions (Signed)
Knee Pain Knee pain is a very common symptom and can have many causes. Knee pain often goes away when you follow your health care provider's instructions for relieving pain and discomfort at home. However, knee pain can develop into a condition that needs treatment. Some conditions may include:  Arthritis caused by wear and tear (osteoarthritis).  Arthritis caused by swelling and irritation (rheumatoid arthritis or gout).  A cyst or growth in your knee.  An infection in your knee joint.  An injury that will not heal.  Damage, swelling, or irritation of the tissues that support your knee (torn ligaments or tendinitis). If your knee pain continues, additional tests may be ordered to diagnose your condition. Tests may include X-rays or other imaging studies of your knee. You may also need to have fluid removed from your knee. Treatment for ongoing knee pain depends on the cause, but treatment may include:  Medicines to relieve pain or swelling.  Steroid injections in your knee.  Physical therapy.  Surgery. HOME CARE INSTRUCTIONS  Take medicines only as directed by your health care provider.  Rest your knee and keep it raised (elevated) while you are resting.  Do not do things that cause or worsen pain.  Avoid high-impact activities or exercises, such as running, jumping rope, or doing jumping jacks.  Apply ice to the knee area:  Put ice in a plastic bag.  Place a towel between your skin and the bag.  Leave the ice on for 20 minutes, 2-3 times a day.  Ask your health care provider if you should wear an elastic knee support.  Keep a pillow under your knee when you sleep.  Lose weight if you are overweight. Extra weight can put pressure on your knee.  Do not use any tobacco products, including cigarettes, chewing tobacco, or electronic cigarettes. If you need help quitting, ask your health care provider. Smoking may slow the healing of any bone and joint problems that you may  have. SEEK MEDICAL CARE IF:  Your knee pain continues, changes, or gets worse.  You have a fever along with knee pain.  Your knee buckles or locks up.  Your knee becomes more swollen. SEEK IMMEDIATE MEDICAL CARE IF:   Your knee joint feels hot to the touch.  You have chest pain or trouble breathing.   This information is not intended to replace advice given to you by your health care provider. Make sure you discuss any questions you have with your health care provider.   Document Released: 06/07/2007 Document Revised: 08/31/2014 Document Reviewed: 03/26/2014 Elsevier Interactive Patient Education 2016 [employer] The [employer] Resource Guide Shelters The United [employer] 211 is a great source of information about community services available.  Access by dialing 2-1-1 from anywhere in [place], or by website -  PooledIncome.plwww.nc211.org.   Other Local Resources (Updated 08/2015)  Shelters  Services   Phone Number and Address  [facility]  Housing for homeless and needy men with substance abuse issues [phone] [address] [place], [place]  [facility] [place]  Emergency assistance  [facility]  Meals  Pantry services [phone] [place], [place]  [facility]  Domestic violence shelter for women and their children [phone] [place], [place]  [facility]  Domestic violence shelter for women and their children [phone] [place], [place]  [facility])     [facility] coordinates access to most shelters in [place]  Apply in person Monday - Friday, 10 am - 4  pm.    After hours/ weekends, contact individual shelters directly [phone] [address] [place], [place]  [facility] [employer]  Food  Emergency financial assistance  Permanent supportive housing [phone] [address] [place], [place]   [employer]   Crisis  assistance  Medication  Housing  Food  Utility assistance [phone] [address] [place], [place]  [employer]    Crisis assistance  Medication  Housing  Food  Utility assistance [phone] [address], [place], [place]  [employer] [facility] [place]     Transitional housing  Case Proofreader assistance [phone] [address] [place], [place]  Lysle Morales, [employer] for adult men and women [phone] [address] [place], [place]  24-hour Crisis Line for those Facing Homelessness    [phone]

## 2015-12-25 ENCOUNTER — Ambulatory Visit: Payer: Medicaid Other | Admitting: Internal Medicine

## 2015-12-30 ENCOUNTER — Telehealth: Payer: Self-pay | Admitting: *Deleted

## 2015-12-30 ENCOUNTER — Ambulatory Visit (INDEPENDENT_AMBULATORY_CARE_PROVIDER_SITE_OTHER): Payer: Medicaid Other | Admitting: Internal Medicine

## 2015-12-30 ENCOUNTER — Encounter: Payer: Self-pay | Admitting: Internal Medicine

## 2015-12-30 VITALS — BP 113/71 | HR 81 | Temp 98.5°F | Ht 64.0 in | Wt 96.5 lb

## 2015-12-30 DIAGNOSIS — B20 Human immunodeficiency virus [HIV] disease: Secondary | ICD-10-CM | POA: Diagnosis present

## 2015-12-30 DIAGNOSIS — R110 Nausea: Secondary | ICD-10-CM | POA: Diagnosis not present

## 2015-12-30 DIAGNOSIS — R21 Rash and other nonspecific skin eruption: Secondary | ICD-10-CM | POA: Diagnosis not present

## 2015-12-30 MED ORDER — VALACYCLOVIR HCL 1 G PO TABS: 1000.0000 mg | ORAL_TABLET | Freq: Two times a day (BID) | ORAL | Status: DC

## 2015-12-30 MED ORDER — EMTRICITABINE-TENOFOVIR DF 200-300 MG PO TABS: 1.0000 | ORAL_TABLET | Freq: Every day | ORAL | Status: DC

## 2015-12-30 MED ORDER — HYDROCORTISONE 1 % EX OINT: 1.0000 "application " | TOPICAL_OINTMENT | Freq: Two times a day (BID) | CUTANEOUS | Status: DC

## 2015-12-30 MED ORDER — ONDANSETRON HCL 4 MG PO TABS: 4.0000 mg | ORAL_TABLET | Freq: Three times a day (TID) | ORAL | Status: DC | PRN

## 2015-12-30 NOTE — Telephone Encounter (Signed)
Patient called to advise that since starting the Descovy last week she has developed a rash that is bleeding, vomiting daily, fever and is very light headed. Asked her if anything else changed and she advised no. Advised her to not take anymore of the medication until I speak with the doctor and call her back.   Called Dr Drue SecondSnider who advised the patient to go to the ED and she would look at the medication and decide after she is stable what to change it to.   Called the patient back and advised her to go to the ED and her boyfriend Reita ClicheBobby advised no they do not have money to go the the ED this is not life or death and he wants her seen here. He advised they have no transportation and the ambulance is a bill they can not pay. He was insistent  she be seen here. I advised him the doctor says she needs to be seen at the ED based on her symptoms. He advised they are not going. Advised him will call the doctor back and see if we can work her in somehow.  Called Dr Drue SecondSnider and advised her of the situation and she advised she really thinks the patient needs to be seen at the ED but for patient safety we can work her in today on the schedule.  Called the patient and advised her to be here today at 130 to see Dr Drue SecondSnider. Patient is on her way. Advised her if she does not make it here today she does need to go to the ED.

## 2015-12-30 NOTE — Progress Notes (Signed)
Patient ID: Tamara Briggs, female   DOB: Mar 31, 1986, 30 y.o.   MRN: [id number]       Patient ID: Tamara Briggs, female   DOB: 11/06/1985, 30 y.o.   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 She was on truvada-ATVc but changed recently to descovy in the last 3 days. She has erupted in a rash, feeling poorly with nausea/vomiting, now dizziness. She describes her ankle itching and she has a oval area on her left hip that is excoriated, she describes as painful. No other areas of involvement. Her torso is otherwise spared of any lesions  She is feeling weak. She vomited 3 x yesteray, once this morning. She states that she is drinking fluids. She has urinate several times today. No one else is sick in her home. +food insecurities.  Outpatient Encounter Prescriptions as of 12/30/2015  Medication Sig  . atazanavir-cobicistat (EVOTAZ) 300-150 MG tablet Take 1 tablet by mouth daily. Swallow whole. Do NOT crush, cut or chew tablet. Take with food.  . benztropine (COGENTIN) 0.5 MG tablet Take 1 tablet (0.5 mg total) by mouth 2 (two) times daily.  Marland Kitchen ENSURE (ENSURE) Take 237 mLs by mouth 2 (two) times daily between meals.  Marland Kitchen ibuprofen (ADVIL,MOTRIN) 600 MG tablet Take 1 tablet (600 mg total) by mouth every 6 (six) hours as needed.  Marland Kitchen LATUDA 40 MG TABS tablet Take 40 mg by mouth daily with breakfast.   . sertraline (ZOLOFT) 50 MG tablet Take 1 tablet (50 mg total) by mouth daily.  Marland Kitchen emtricitabine-tenofovir AF (DESCOVY) 200-25 MG tablet Take 1 tablet by mouth daily. (Patient not taking: Reported on 12/30/2015)   No facility-administered encounter medications on file as of 12/30/2015.     Patient Active Problem List   Diagnosis Date Noted  . Abnormality of gait 07/15/2015  . Knee pain, left 07/15/2015  . 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 Tardive dyskinesia 06/13/2014  . S/P C-section 12/25/2013  . Preterm premature rupture of  membranes (PPROM) delivered, current hospitalization 12/14/2013  . Normocytic anemia 12/14/2013  . 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 Abnormal findings on antenatal screening 10/27/2013  . Maternal HIV positive complicating pregnancy in second trimester, antepartum (HCC) 10/18/2013  . Schizophrenia (HCC) 10/18/2013  . Pyelonephritis 07/21/2013  . Protein-calorie malnutrition, severe (HCC) 03/16/2013  . Tobacco abuse 03/15/2013  . h/o crack Cocaine abuse 03/15/2013  . h/o oral Thrush 03/15/2013  . HIV (human immunodeficiency virus infection) (HCC) 07/27/2011  . Chlamydia and Gonorrhea 07/27/2011  . H/O abnormal cervical Papanicolaou smear 07/26/2008     There are no preventive care reminders to display for this patient.   Review of Systems + n/v but no diarrhea. Weak, occ dizzy when standing up, palpitations with walking occ. Rash as described above. 10 pont ros otherwise negaitve. Physical Exam   BP 101/70 mmHg  Pulse 86  Temp(Src) 98.5 F (36.9 C) (Oral)  Ht  (1.626 m)  Wt 96 lb 8 oz (43.772 kg)  BMI 16.56 kg/m2  LMP  (LMP Unknown) Physical Exam  Constitutional:  oriented to person, place, and time. appears chronically ill. Looks weaker than her baseline. No distress.  HENT: [place]/AT, PERRLA, no scleral icterus Mouth/Throat: Oropharynx is clear and moist. No oropharyngeal exudate.  Cardiovascular: Normal rate, regular rhythm and normal heart sounds. Exam reveals no gallop and no friction rub.  No murmur heard.  Pulmonary/Chest: Effort normal and breath sounds normal. No  respiratory distress.  has no wheezes.  Neck = supple, no nuchal rigidity Abdominal: Soft. Bowel sounds are normal.  exhibits no distension. There is no tenderness.  Lymphadenopathy: no cervical adenopathy. No axillary adenopathy Neurological: alert and oriented to person, place, and time.  Skin: oval area roughly 3cm length over left anterior hip, scabbed over. No surrounding erythema, no  pustules. Dry legs, white excoriated marks to medial malleolus. No bites noted. Psychiatric: a normal mood and affect.  behavior is normal.   Lab Results  Component Value Date   CD4TCELL 35 10/31/2015   Lab Results  Component Value Date   CD4TABS 600 10/31/2015   CD4TABS 740 06/11/2015   CD4TABS 460 11/27/2014   Lab Results  Component Value Date   HIV1RNAQUANT 188* 10/31/2015   Lab Results  Component Value Date   HEPBSAB POS* 07/09/2011   No results found for: RPR  CBC Lab Results  Component Value Date   WBC 4.2 10/31/2015   RBC 4.57 10/31/2015   HGB 12.7 10/31/2015   HCT 38.6 10/31/2015   PLT 182 10/31/2015   MCV 84.5 10/31/2015   MCH 27.8 10/31/2015   MCHC 32.9 10/31/2015   RDW 13.7 10/31/2015   LYMPHSABS 1.6 10/31/2015   MONOABS 0.4 10/31/2015   EOSABS 0.1 10/31/2015   BASOSABS 0.0 10/31/2015   BMET Lab Results  Component Value Date   NA 141 10/31/2015   K 4.1 10/31/2015   CL 106 10/31/2015   CO2 28 10/31/2015   GLUCOSE 91 10/31/2015   BUN 21 10/31/2015   CREATININE 0.98 10/31/2015   CALCIUM 8.8 10/31/2015   GFRNONAA >89 11/27/2014   GFRAA >89 11/27/2014     Assessment and Plan  N/v = likely viral. Mucosa is moist. Will check orthostatics to see if dehydrated. Will give anti-emetics.ask her to stop taking ibuprofen in case dehydrated. Will check bmp  hiv disease = she had low level viremia, concerning for not taking meds consistently. Will check labs again with genotype.  will switch back to trudava plus evotaz. If rash worsens,  can do triumeq. For now, she feels triumeq pill is too large  Rash = does not appear like drug rash. Concern that the pain is due to shingles. Will do a trial of valacyclovir to see if it improves  Pruritic rash = will do hydrocortisone cream to ankles

## 2016-01-06 ENCOUNTER — Ambulatory Visit: Payer: Medicaid Other | Admitting: Internal Medicine

## 2016-01-24 ENCOUNTER — Ambulatory Visit: Payer: Medicaid Other | Admitting: Internal Medicine

## 2016-01-29 ENCOUNTER — Ambulatory Visit: Payer: Medicaid Other | Admitting: Internal Medicine

## 2016-02-03 ENCOUNTER — Ambulatory Visit: Payer: Medicaid Other | Admitting: Internal Medicine

## 2016-03-18 ENCOUNTER — Ambulatory Visit: Payer: Medicaid Other | Admitting: Internal Medicine

## 2016-03-20 ENCOUNTER — Telehealth: Payer: Self-pay | Admitting: *Deleted

## 2016-03-20 NOTE — Telephone Encounter (Signed)
Patient called and Tamara Briggs she is losing weight and sick everyday. She thinks it is the medication she was put on at her last visit. She is very concerned about the weight lose and wants to be seen as soon as possible. She missed an appointment 03/18/16. Advised her the next available appt is not until 04/2016 she was very upset and asked if I could ask the doctor if she could come in before that date. Advised her will ask and give her a call back.

## 2016-03-25 NOTE — Telephone Encounter (Signed)
Can you have her come in tomorrw so that we can see her asap

## 2016-03-25 NOTE — Telephone Encounter (Signed)
Called the patient to try and get her in to see the doctor tomorrow 03/26/16 per Dr Drue Second and had to leave a message with her partner to have her call the office once he speaks with her. Will wait for the call.

## 2016-03-26 ENCOUNTER — Ambulatory Visit (INDEPENDENT_AMBULATORY_CARE_PROVIDER_SITE_OTHER): Payer: Medicaid Other | Admitting: Internal Medicine

## 2016-03-26 DIAGNOSIS — B20 Human immunodeficiency virus [HIV] disease: Secondary | ICD-10-CM

## 2016-03-26 DIAGNOSIS — F209 Schizophrenia, unspecified: Secondary | ICD-10-CM

## 2016-03-26 DIAGNOSIS — R110 Nausea: Secondary | ICD-10-CM

## 2016-03-26 DIAGNOSIS — E43 Unspecified severe protein-calorie malnutrition: Secondary | ICD-10-CM

## 2016-03-26 MED ORDER — PROMETHAZINE HCL 25 MG PO TABS: 12.5000 mg | ORAL_TABLET | Freq: Three times a day (TID) | ORAL | 0 refills | Status: DC | PRN

## 2016-03-26 MED ORDER — OMEPRAZOLE 20 MG PO CPDR: 20.0000 mg | DELAYED_RELEASE_CAPSULE | Freq: Every day | ORAL | 11 refills | Status: DC

## 2016-03-26 NOTE — Progress Notes (Signed)
Patient ID: Tamara Briggs, female   DOB: 1985-12-15, 30 y.o.   MRN: [id number]  HPI Tamara Briggs is a 30yo F with advanced hiv disease ,and schizophrenia c/b tardive dyskinesia 2/2 medication side effects. She has not been in clinic since may 2017, at that time labs revealed CD 4 count of 600/VL 188 (where she was previously undetectable). At that visit, she had significant nausea/vomiting concerning for med absorption/adherence. Over the last few months, still having abdominal discomfort, nausea/vomiting and diarrhea. She stopped taking her medication since she felt poorly that descovy (changed from truvada) caused in part some of her symptoms. In the meantime, she has had significant weight loss as well, down #12 from 98lb in May.   She is upbeat but states that she is tired of feeling poorly with N/V/D. Epigastric pain no fevers, chills, nightsweats, cough. She feels she does not have involuntary tremors  Outpatient Encounter Prescriptions as of 03/26/2016  Medication Sig  . benztropine (COGENTIN) 0.5 MG tablet Take 1 tablet (0.5 mg total) by mouth 2 (two) times daily.  Marland Kitchen ibuprofen (ADVIL,MOTRIN) 600 MG tablet Take 1 tablet (600 mg total) by mouth every 6 (six) hours as needed.  Marland Kitchen LATUDA 40 MG TABS tablet Take 40 mg by mouth daily with breakfast.   . sertraline (ZOLOFT) 50 MG tablet Take 1 tablet (50 mg total) by mouth daily.  Marland Kitchen omeprazole (PRILOSEC) 20 MG capsule Take 1 capsule (20 mg total) by mouth daily.  . promethazine (PHENERGAN) 25 MG tablet Take 0.5 tablets (12.5 mg total) by mouth every 8 (eight) hours as needed for nausea or vomiting. Can increase to full tab if symptoms persist.  . [DISCONTINUED] atazanavir-cobicistat (EVOTAZ) 300-150 MG tablet Take 1 tablet by mouth daily. Swallow whole. Do NOT crush, cut or chew tablet. Take with food. (Patient not taking: Reported on 03/26/2016)  . [DISCONTINUED] emtricitabine-tenofovir (TRUVADA) 200-300 MG tablet Take 1 tablet by mouth daily. (Patient  not taking: Reported on 03/26/2016)  . [DISCONTINUED] ENSURE (ENSURE) Take 237 mLs by mouth 2 (two) times daily between meals. (Patient not taking: Reported on 03/26/2016)  . [DISCONTINUED] hydrocortisone 1 % ointment Apply 1 application topically 2 (two) times daily. To legs if needed for itchiness (Patient not taking: Reported on 03/26/2016)  . [DISCONTINUED] ondansetron (ZOFRAN) 4 MG tablet Take 1 tablet (4 mg total) by mouth every 8 (eight) hours as needed for nausea or vomiting. (Patient not taking: Reported on 03/26/2016)  . [DISCONTINUED] valACYclovir (VALTREX) 1000 MG tablet Take 1 tablet (1,000 mg total) by mouth 2 (two) times daily. (Patient not taking: Reported on 03/26/2016)   No facility-administered encounter medications on file as of 03/26/2016.      Patient Active Problem List   Diagnosis Date Noted  . Abnormality of gait 07/15/2015  . Knee pain, left 07/15/2015  . 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 Tardive dyskinesia 06/13/2014  . S/P C-section 12/25/2013  . Preterm premature rupture of membranes (PPROM) delivered, current hospitalization 12/14/2013  . Normocytic anemia 12/14/2013  . 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 Abnormal findings on antenatal screening 10/27/2013  . Maternal HIV positive complicating pregnancy in second trimester, antepartum (HCC) 10/18/2013  . Schizophrenia (HCC) 10/18/2013  . Pyelonephritis 07/21/2013  . Protein-calorie malnutrition, severe (HCC) 03/16/2013  . Tobacco abuse 03/15/2013  . h/o crack Cocaine abuse 03/15/2013  . h/o oral Thrush 03/15/2013  . HIV (human immunodeficiency virus infection) (HCC) 07/27/2011  . Chlamydia and Gonorrhea 07/27/2011  . H/O abnormal cervical Papanicolaou  smear 07/26/2008     Health Maintenance Due  Topic Date Due  . INFLUENZA VACCINE  03/24/2016     Review of Systems  Constitutional: + weight loss. Negative for fever, chills, diaphoresis, activity change, appetite change, fatigue  HENT:  Negative for congestion, sore throat, rhinorrhea, sneezing, trouble swallowing and sinus pressure.  Eyes: Negative for photophobia and visual disturbance.  Respiratory: Negative for cough, chest tightness, shortness of breath, wheezing and stridor.  Cardiovascular: Negative for chest pain, palpitations and leg swelling.  Gastrointestinal: positive  for nausea, vomiting, abdominal pain, diarrhea, but negative for constipation, blood in stool, abdominal distention and anal bleeding.  Genitourinary: Negative for dysuria, hematuria, flank pain and difficulty urinating.  Musculoskeletal: Negative for myalgias, back pain, joint swelling, arthralgias and gait problem.  Skin: Negative for color change, pallor, rash and wound.  Neurological: Negative for dizziness, tremors, weakness and light-headedness.  Hematological: Negative for adenopathy. Does not bruise/bleed easily.  Psychiatric/Behavioral: Negative for behavioral problems, confusion, sleep disturbance, dysphoric mood, decreased concentration and agitation.    Physical Exam  Physical Exam  Constitutional:  oriented to person, place, and time. appears chronically ill. emaciated, with visible marked clavicles and rib markings HENT: [place]/AT, PERRLA, no scleral icterus Mouth/Throat: Oropharynx is clear and moist. No oropharyngeal exudate. No thrush Cardiovascular: Normal rate, regular rhythm and normal heart sounds. Exam reveals no gallop and no friction rub.  No murmur heard.  Pulmonary/Chest: Effort normal and breath sounds normal. No respiratory distress.  has no wheezes.  Neck = supple, no nuchal rigidity Abdominal: Soft. Bowel sounds are normal.  exhibits no distension. There is no tenderness. Scaphoid  Lymphadenopathy: no cervical adenopathy. No axillary adenopathy Neurological: alert and oriented to person, place, and time.  Skin: Skin is warm and dry. No rash noted. No erythema.  Psychiatric: a normal mood and affect.  behavior is normal.       Lab Results  Component Value Date   CD4TCELL 35 10/31/2015   Lab Results  Component Value Date   CD4TABS 600 10/31/2015   CD4TABS 740 06/11/2015   CD4TABS 460 11/27/2014   Lab Results  Component Value Date   HIV1RNAQUANT 188 (H) 10/31/2015   Lab Results  Component Value Date   HEPBSAB POS (A) 07/09/2011   No results found for: RPR  CBC Lab Results  Component Value Date   WBC 4.2 10/31/2015   RBC 4.57 10/31/2015   HGB 12.7 10/31/2015   HCT 38.6 10/31/2015   PLT 182 10/31/2015   MCV 84.5 10/31/2015   MCH 27.8 10/31/2015   MCHC 32.9 10/31/2015   RDW 13.7 10/31/2015   LYMPHSABS 1.6 10/31/2015   MONOABS 0.4 10/31/2015   EOSABS 0.1 10/31/2015   BASOSABS 0.0 10/31/2015   BMET Lab Results  Component Value Date   NA 141 10/31/2015   K 4.1 10/31/2015   CL 106 10/31/2015   CO2 28 10/31/2015   GLUCOSE 91 10/31/2015   BUN 21 10/31/2015   CREATININE 0.98 10/31/2015   CALCIUM 8.8 10/31/2015   GFRNONAA >89 11/27/2014   GFRAA >89 11/27/2014     Assessment and Plan  hiv disease =poorly controlled. She has been off of hiv regimen for at least 2 months. She is unwilling to get blood work today. Will have her get viral load and genotype next week in 4 days and have counseling to start new regimen. Will start her on darunavir/cobi plus ? truvada - though I am concerned about m184v, k65r. She previously had wildtype virus. Issue  for katharine is pill size. Can consider epzicom plus tivicay  Epigastric pain = concern for gastritis from nsaid. Will stop ibuprofen and have her start omeprazole. Recheck in 5 days if having improvement  Nausea =patient does not want any phenergan. She thinks it may be ok  Severe protein-caloric malnutrition = likely from poorly controlled hiv disease. Will need to get access to ensure  Consider re-engagement with [facility]

## 2016-03-31 ENCOUNTER — Ambulatory Visit: Payer: Medicaid Other | Admitting: Internal Medicine

## 2016-05-14 ENCOUNTER — Ambulatory Visit: Payer: Medicaid Other | Admitting: Nurse Practitioner

## 2016-05-15 ENCOUNTER — Encounter: Payer: Self-pay | Admitting: Nurse Practitioner

## 2016-05-21 ENCOUNTER — Other Ambulatory Visit: Payer: Self-pay | Admitting: Internal Medicine

## 2016-05-21 ENCOUNTER — Ambulatory Visit (INDEPENDENT_AMBULATORY_CARE_PROVIDER_SITE_OTHER): Payer: Medicaid Other | Admitting: Internal Medicine

## 2016-05-21 ENCOUNTER — Encounter: Payer: Self-pay | Admitting: Internal Medicine

## 2016-05-21 VITALS — BP 115/78 | HR 98 | Temp 99.1°F | Wt 85.0 lb

## 2016-05-21 DIAGNOSIS — B20 Human immunodeficiency virus [HIV] disease: Secondary | ICD-10-CM | POA: Diagnosis present

## 2016-05-21 DIAGNOSIS — O0000 Abdominal pregnancy without intrauterine pregnancy: Secondary | ICD-10-CM

## 2016-05-21 MED ORDER — EMTRICITABINE-TENOFOVIR DF 200-300 MG PO TABS: 1.0000 | ORAL_TABLET | Freq: Every day | ORAL | 11 refills | Status: DC

## 2016-05-21 MED ORDER — ONDANSETRON 8 MG PO TBDP: 8.0000 mg | ORAL_TABLET | Freq: Three times a day (TID) | ORAL | 0 refills | Status: DC | PRN

## 2016-05-21 MED ORDER — ATAZANAVIR-COBICISTAT 300-150 MG PO TABS: 1.0000 | ORAL_TABLET | Freq: Every day | ORAL | 11 refills | Status: DC

## 2016-05-21 NOTE — Progress Notes (Signed)
Patient ID: Tamara Briggs, female   DOB: 21-Mar-1986, 30 y.o.   MRN: [id number]  HPI  Tamara Briggs 30yo F with HIV disease, stopped taking her hiv medication roughly 1-2 months ago due to nausea but also found that she is pregnant. She has not restarted hiv medicines and would like to start back though she believes her symptoms are due to descovy. The patient is very abrupt during this visit which is unusual for her. She has not disclosed who the father is and says she is taking all her other meds. She is here to restart medications since she is noticing her skin become pruritic.  Outpatient Encounter Prescriptions as of 05/21/2016  Medication Sig  . benztropine (COGENTIN) 0.5 MG tablet Take 1 tablet (0.5 mg total) by mouth 2 (two) times daily.  Marland Kitchen ibuprofen (ADVIL,MOTRIN) 600 MG tablet Take 1 tablet (600 mg total) by mouth every 6 (six) hours as needed.  Marland Kitchen LATUDA 40 MG TABS tablet Take 40 mg by mouth daily with breakfast.   . omeprazole (PRILOSEC) 20 MG capsule Take 1 capsule (20 mg total) by mouth daily.  . sertraline (ZOLOFT) 50 MG tablet Take 1 tablet (50 mg total) by mouth daily.   No facility-administered encounter medications on file as of 05/21/2016.      Patient Active Problem List   Diagnosis Date Noted  . Abnormality of gait 07/15/2015  . Knee pain, left 07/15/2015  . 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 Tardive dyskinesia 06/13/2014  . S/P C-section 12/25/2013  . Preterm premature rupture of membranes (PPROM) delivered, current hospitalization 12/14/2013  . Normocytic anemia 12/14/2013  . 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 Abnormal findings on antenatal screening 10/27/2013  . Maternal HIV positive complicating pregnancy in second trimester, antepartum (HCC) 10/18/2013  . Schizophrenia (HCC) 10/18/2013  . Pyelonephritis 07/21/2013  . Protein-calorie malnutrition, severe (HCC) 03/16/2013  . Tobacco abuse 03/15/2013  . h/o crack Cocaine abuse 03/15/2013  .  h/o oral Thrush 03/15/2013  . HIV (human immunodeficiency virus infection) (HCC) 07/27/2011  . Chlamydia and Gonorrhea 07/27/2011  . H/O abnormal cervical Papanicolaou smear 07/26/2008     Health Maintenance Due  Topic Date Due  . INFLUENZA VACCINE  03/24/2016     Review of Systems + pruritic rash, loss of weight. Otherwise 10 point ros is negative Physical Exam   BP 115/78   Pulse 98   Temp 99.1 F (37.3 C) (Oral)   Wt 38.6 kg (85 lb)   BMI 14.59 kg/m  Physical Exam  Constitutional:  oriented to person, place, and time. cachetic No distress.  HENT: [place]/AT, PERRLA, no scleral icterus Mouth/Throat: Oropharynx is clear and moist. No oropharyngeal exudate.  Cardiovascular: Normal rate, regular rhythm and normal heart sounds. Exam reveals no gallop and no friction rub.  No murmur heard.  Pulmonary/Chest: Effort normal and breath sounds normal. No respiratory distress.  has no wheezes.  Neck = supple, no nuchal rigidity Abdominal: Soft. Bowel sounds are normal.  exhibits no distension. There is no tenderness.  Lymphadenopathy: no cervical adenopathy. No axillary adenopathy Neurological: alert and oriented to person, place, and time.  Skin: Skin is warm and dry. No rash noted. No erythema.  Psychiatric: agitated, irritable(not her baseline)  Lab Results  Component Value Date   CD4TCELL 35 10/31/2015   Lab Results  Component Value Date   CD4TABS 600 10/31/2015   CD4TABS 740 06/11/2015   CD4TABS 460 11/27/2014   Lab Results  Component Value Date  HIV1RNAQUANT 188 (H) 10/31/2015   Lab Results  Component Value Date   HEPBSAB POS (A) 07/09/2011   No results found for: RPR  CBC Lab Results  Component Value Date   WBC 4.2 10/31/2015   RBC 4.57 10/31/2015   HGB 12.7 10/31/2015   HCT 38.6 10/31/2015   PLT 182 10/31/2015   MCV 84.5 10/31/2015   MCH 27.8 10/31/2015   MCHC 32.9 10/31/2015   RDW 13.7 10/31/2015   LYMPHSABS 1.6 10/31/2015   MONOABS 0.4 10/31/2015    EOSABS 0.1 10/31/2015   BASOSABS 0.0 10/31/2015   BMET Lab Results  Component Value Date   NA 141 10/31/2015   K 4.1 10/31/2015   CL 106 10/31/2015   CO2 28 10/31/2015   GLUCOSE 91 10/31/2015   BUN 21 10/31/2015   CREATININE 0.98 10/31/2015   CALCIUM 8.8 10/31/2015   GFRNONAA >89 11/27/2014   GFRAA >89 11/27/2014     Assessment and Plan  HIV disease -will place her on truvada and evotaz but may need to change once we figure out where she is pregnant. We have no records from primary and she declines to do further testing  - will give anti nausea meds - she is deferring labs at this visit - needs women's clinic referral if pregnancy  - recommend to start prenatal vitamins  Aggressive behavior = this is far from her norm so I question if she is taking her latuda or possibly drug relapse. Will screen at next visit  rtc in 2-4 wk. Have clinic ID pharmacy also reach out for adherence counseling

## 2016-06-02 ENCOUNTER — Ambulatory Visit: Payer: Medicaid Other | Admitting: Nurse Practitioner

## 2016-06-02 ENCOUNTER — Telehealth: Payer: Self-pay | Admitting: Nurse Practitioner

## 2016-06-02 NOTE — Telephone Encounter (Signed)
Patient called 2:53:40 to advise, [place] bus [place], states other bus broke down, they had to send another bus, she is on her way and should be here in 10 minutes for 3:00 appt w/ NP, CM.

## 2016-06-02 NOTE — Telephone Encounter (Signed)
Pt arrived 1522, rescheduled for tomorrow per Seth Bakeana Jones at front desk.

## 2016-06-03 ENCOUNTER — Ambulatory Visit (INDEPENDENT_AMBULATORY_CARE_PROVIDER_SITE_OTHER): Payer: Medicaid Other | Admitting: Nurse Practitioner

## 2016-06-03 ENCOUNTER — Encounter: Payer: Self-pay | Admitting: Nurse Practitioner

## 2016-06-03 VITALS — BP 102/54 | HR 92 | Ht 64.0 in | Wt 91.0 lb

## 2016-06-03 DIAGNOSIS — G2401 Drug induced subacute dyskinesia: Secondary | ICD-10-CM | POA: Diagnosis not present

## 2016-06-03 DIAGNOSIS — G2119 Other drug induced secondary parkinsonism: Secondary | ICD-10-CM

## 2016-06-03 MED ORDER — BENZTROPINE MESYLATE 0.5 MG PO TABS: 0.5000 mg | ORAL_TABLET | Freq: Two times a day (BID) | ORAL | 11 refills | Status: DC

## 2016-06-03 NOTE — Patient Instructions (Signed)
Continue Cogentin will refill for 1 year Follow up yearly and  prn

## 2016-06-03 NOTE — Progress Notes (Signed)
I have read the note, and I agree with the clinical assessment and plan.  Inari Shin KEITH   

## 2016-06-03 NOTE — Progress Notes (Signed)
[facility]  PATIENT: Tamara Briggs DOB: 08/23/1986   REASON FOR VISIT: Follow-up for abnormality of gait, tardive dyskinesia secondary parkinsonism HISTORY FROM: Patient    HISTORY OF PRESENT ILLNESS:Ms. Tamara AugustaBynum is a 30 year old right-handed black female with a history of secondary parkinsonism. 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 This is felt to be due [facility] which [facility] . She has responded well to Cogentin .The patient comes in today indicating that her walking is better no falls since last seen. She ambulates without assistive device.  She comes to this office for further evaluation and refills. She had an appointment yesterday but was 35 minutes late, she was 20 minutes late today.  REVIEW OF SYSTEMS: Full 14 system review of systems performed and notable only for those listed, all others are neg:  Constitutional: neg  Cardiovascular: neg Ear/Nose/Throat: neg  Skin: neg Eyes: Blurred vision and light sensitivity  Respiratory: neg Gastroitestinal: neg  Hematology/Lymphatic: neg  Endocrine: neg Musculoskeletal:gait abnormality Allergy/Immunology: neg Neurological: neg Psychiatric: Schizophrenia  Sleep : neg   ALLERGIES: No Known Allergies  HOME MEDICATIONS: Outpatient Medications Prior to Visit  Medication Sig Dispense Refill  . atazanavir-cobicistat (EVOTAZ) 300-150 MG tablet Take 1 tablet by mouth daily. Swallow whole. Do NOT crush, cut or chew tablet. Take with food. 30 tablet 11  . benztropine (COGENTIN) 0.5 MG tablet Take 1 tablet (0.5 mg total) by mouth 2 (two) times daily. 60 tablet 8  . emtricitabine-tenofovir (TRUVADA) 200-300 MG tablet Take 1 tablet by mouth daily. 30 tablet 11  . ibuprofen (ADVIL,MOTRIN) 600 MG tablet Take 1 tablet (600 mg total) by mouth every 6 (six) hours as needed. 30 tablet 0  . LATUDA 40 MG TABS tablet Take 40 mg by mouth daily with breakfast.    2  . sertraline (ZOLOFT) 50 MG tablet Take 1 tablet (50 mg total) by mouth daily. 30 tablet 5  . omeprazole (PRILOSEC) 20 MG capsule Take 1 capsule (20 mg total) by mouth daily. (Patient not taking: Reported on 06/03/2016) 30 capsule 11  . ondansetron (ZOFRAN ODT) 8 MG disintegrating tablet Take 1 tablet (8 mg total) by mouth every 8 (eight) hours as needed for nausea or vomiting. (Patient not taking: Reported on 06/03/2016) 20 tablet 0   No facility-administered medications prior to visit.     PAST MEDICAL HISTORY: Past Medical History:  Diagnosis Date  . Anxiety   . Bipolar disorder (HCC)   . Crack cocaine use   . Depression   . 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 Infection   . Otitis media of left ear 03/15/2013  . Schizophrenia (HCC)   . 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 Tardive dyskinesia 06/13/2014    PAST SURGICAL HISTORY: Past Surgical History:  Procedure Laterality Date  . CESAREAN SECTION N/A 12/24/2013   Procedure: CESAREAN SECTION;  Surgeon: Lesly DukesKelly H Leggett, MD;  Location: [facility];  Service: Obstetrics;  Laterality: N/A;  . COLPOSCOPY  2009  . DENTAL RESTORATION/EXTRACTION WITH X-RAY      FAMILY HISTORY: Family History  Problem Relation Age of Onset  . Diabetes Mother   . High blood pressure Mother   . Cancer Father     SOCIAL HISTORY: Social History   Social History  . Marital status: Single    Spouse name: N/A  . Number of children: 1  . Years of education: 11th   Occupational History  . Not on file.  Social History Main Topics  . Smoking status: Light Tobacco Smoker    Packs/day: 0.10    Years: 3.00    Types: Cigarettes  . Smokeless tobacco: Never Used     Comment: cutting back, 1 cigs/day  . Alcohol use No  . Drug use: No  . Sexual activity: Yes    Partners: Male    Birth control/ protection: None     Comment: declined condoms   Other Topics Concern  . Not on file   Social History Narrative   Patient lives at home with her  partner [name].   Unemployed.   Education 11 th grade.   Right handed.   Patient does not drink caffeine.     PHYSICAL EXAM  Vitals:   06/03/16 1404  BP: (!) 102/54  Pulse: 92  Weight: 91 lb (41.3 kg)  Height: 5\' 4"  (1.626 m)   Body mass index is 15.62 kg/m. General: The patient is alert and cooperative at the time of the examination. Skin: No significant peripheral edema is noted.  Neurologic Exam Mental status: The patient is alert and oriented x 3 at the time of the examination. The patient has apparent normal recent and remote memory, with an apparently normal attention span and concentration ability.  Cranial nerves: Facial symmetry is present. Speech is normal, no aphasia or dysarthria is noted. PERL.Extraocular movements are full. Visual fields are full. Motor: The patient has good strength in all 4 extremities. Sensory examination: Soft touch sensation is symmetric on the face, arms, and legs. Coordination: The patient has good finger-nose-finger and heel-to-shin bilaterally. Mild choreoathetoid movements are noted  in both upper extremities. Gait and station: The patient has a slightly wide-based gait. Tandem gait is mildly unsteady. Romberg is negative. No assistive device Reflexes: Deep tendon reflexes are symmetric upper and lower. DIAGNOSTIC DATA (LABS, IMAGING, TESTING) - I reviewed patient records, labs, notes, testing and imaging myself where available.  Lab Results  Component Value Date   WBC 4.2 10/31/2015   HGB 12.7 10/31/2015   HCT 38.6 10/31/2015   MCV 84.5 10/31/2015   PLT 182 10/31/2015      Component Value Date/Time   NA 141 10/31/2015 1000   K 4.1 10/31/2015 1000   CL 106 10/31/2015 1000   CO2 28 10/31/2015 1000   GLUCOSE 91 10/31/2015 1000   BUN 21 10/31/2015 1000   CREATININE 0.98 10/31/2015 1000   CALCIUM 8.8 10/31/2015 1000   PROT 7.1 10/31/2015 1000   ALBUMIN 3.9 10/31/2015 1000   AST 18 10/31/2015 1000   ALT 11 10/31/2015  1000   ALKPHOS 71 10/31/2015 1000   BILITOT 0.6 10/31/2015 1000   GFRNONAA >89 11/27/2014 1208   GFRAA >89 11/27/2014 1208   Lab Results  Component Value Date   CHOL 127 10/31/2015   HDL 63 10/31/2015   LDLCALC 45 10/31/2015   TRIG 97 10/31/2015   CHOLHDL 2.0 10/31/2015    ASSESSMENT AND PLAN 30 y.o. 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 Here to follow up.  Continue Cogentin will refill Follow-up yearly and when necessary Please be on time for future appointments or you may have to  reschedule Nilda Riggs, [facility], [facility] APRN  [facility] [address], [address] [place], [place] [address] [phone]

## 2016-06-12 ENCOUNTER — Telehealth: Payer: Self-pay | Admitting: *Deleted

## 2016-06-12 NOTE — Telephone Encounter (Signed)
Patient called requesting a refill on Zofran. She said she found a bottle with 20 pills in it and didn't know what it was for.  I asked her if she had been vomiting and she said yes for three days. After looking at Dr. Feliz BeamSnider's note I see that she Rx Zofran at last visit on 05/21/16. She had her boyfriend speak to me and he explained that Pernella did not understand what the Zofran was for. She has an appt with Mitch for housing on Monday 10.23.17. Advised her to take the Zofran and try to rest and eat something non greasy. Such as Saltine crackers, plain rice. Etc. She asked about orange juice and I told her to try whatever does not upset her stomach. Water or ginger ale would be better if possible. Advised her that when she sees [place] on Monday he can speak to an MD and also we will see if she is feeling better at that time.

## 2016-06-18 ENCOUNTER — Other Ambulatory Visit: Payer: Self-pay | Admitting: *Deleted

## 2016-06-18 MED ORDER — ONDANSETRON 8 MG PO TBDP: 8.0000 mg | ORAL_TABLET | Freq: Three times a day (TID) | ORAL | 6 refills | Status: DC | PRN

## 2016-06-18 NOTE — Addendum Note (Signed)
Addended by: Jennet MaduroESTRIDGE, DENISE D on: 06/18/2016 11:44 AM   Modules accepted: Orders

## 2016-06-18 NOTE — Telephone Encounter (Signed)
Previously rxed by Dr. Drue SecondSnider at last office visit with no refills.  Asked Dr. Drue SecondSnider for refills.  Waiting on response.

## 2016-06-18 NOTE — Telephone Encounter (Signed)
Received verbal approval from Dr. Drue SecondSnider to refill x 6 times, #20.

## 2016-06-30 ENCOUNTER — Inpatient Hospital Stay (HOSPITAL_COMMUNITY)
Admission: AD | Admit: 2016-06-30 | Discharge: 2016-06-30 | Disposition: A | Discharge status: Home / Self Care | Payer: Medicaid Other | Source: Ambulatory Visit | Attending: Family Medicine | Admitting: Family Medicine

## 2016-06-30 ENCOUNTER — Inpatient Hospital Stay (HOSPITAL_COMMUNITY): Payer: Medicaid Other

## 2016-06-30 ENCOUNTER — Encounter (HOSPITAL_COMMUNITY): Payer: Self-pay

## 2016-06-30 DIAGNOSIS — Z3A19 19 weeks gestation of pregnancy: Secondary | ICD-10-CM | POA: Diagnosis not present

## 2016-06-30 DIAGNOSIS — Z8249 Family history of ischemic heart disease and other diseases of the circulatory system: Secondary | ICD-10-CM | POA: Diagnosis not present

## 2016-06-30 DIAGNOSIS — O99332 Smoking (tobacco) complicating pregnancy, second trimester: Secondary | ICD-10-CM | POA: Insufficient documentation

## 2016-06-30 DIAGNOSIS — Z21 Asymptomatic human immunodeficiency virus [HIV] infection status: Secondary | ICD-10-CM | POA: Insufficient documentation

## 2016-06-30 DIAGNOSIS — Z833 Family history of diabetes mellitus: Secondary | ICD-10-CM | POA: Diagnosis not present

## 2016-06-30 DIAGNOSIS — O99322 Drug use complicating pregnancy, second trimester: Secondary | ICD-10-CM | POA: Diagnosis not present

## 2016-06-30 DIAGNOSIS — F209 Schizophrenia, unspecified: Secondary | ICD-10-CM | POA: Insufficient documentation

## 2016-06-30 DIAGNOSIS — N898 Other specified noninflammatory disorders of vagina: Secondary | ICD-10-CM | POA: Insufficient documentation

## 2016-06-30 DIAGNOSIS — Z3687 Encounter for antenatal screening for uncertain dates: Secondary | ICD-10-CM

## 2016-06-30 DIAGNOSIS — O26892 Other specified pregnancy related conditions, second trimester: Secondary | ICD-10-CM | POA: Diagnosis not present

## 2016-06-30 DIAGNOSIS — F1490 Cocaine use, unspecified, uncomplicated: Secondary | ICD-10-CM | POA: Diagnosis not present

## 2016-06-30 DIAGNOSIS — O26899 Other specified pregnancy related conditions, unspecified trimester: Secondary | ICD-10-CM

## 2016-06-30 DIAGNOSIS — R109 Unspecified abdominal pain: Secondary | ICD-10-CM | POA: Insufficient documentation

## 2016-06-30 DIAGNOSIS — O98712 Human immunodeficiency virus [HIV] disease complicating pregnancy, second trimester: Secondary | ICD-10-CM | POA: Insufficient documentation

## 2016-06-30 DIAGNOSIS — Z363 Encounter for antenatal screening for malformations: Secondary | ICD-10-CM

## 2016-06-30 DIAGNOSIS — F17210 Nicotine dependence, cigarettes, uncomplicated: Secondary | ICD-10-CM | POA: Diagnosis not present

## 2016-06-30 DIAGNOSIS — O99342 Other mental disorders complicating pregnancy, second trimester: Secondary | ICD-10-CM | POA: Diagnosis not present

## 2016-06-30 DIAGNOSIS — Z79899 Other long term (current) drug therapy: Secondary | ICD-10-CM | POA: Diagnosis not present

## 2016-06-30 LAB — CBC
HEMATOCRIT: 29.8 % — AB (ref 36.0–46.0)
Hemoglobin: 10 g/dL — ABNORMAL LOW (ref 12.0–15.0)
MCH: 28.2 pg (ref 26.0–34.0)
MCHC: 33.6 g/dL (ref 30.0–36.0)
MCV: 83.9 fL (ref 78.0–100.0)
Platelets: 164 10*3/uL (ref 150–400)
RBC: 3.55 MIL/uL — ABNORMAL LOW (ref 3.87–5.11)
RDW: 14.6 % (ref 11.5–15.5)
WBC: 5.4 10*3/uL (ref 4.0–10.5)

## 2016-06-30 LAB — HEPATITIS B SURFACE ANTIGEN: Hepatitis B Surface Ag: NEGATIVE

## 2016-06-30 LAB — DIFFERENTIAL
BASOS ABS: 0 10*3/uL (ref 0.0–0.1)
BASOS PCT: 0 %
EOS ABS: 0.1 10*3/uL (ref 0.0–0.7)
EOS PCT: 1 %
Lymphocytes Relative: 18 %
Lymphs Abs: 1 10*3/uL (ref 0.7–4.0)
MONO ABS: 0.3 10*3/uL (ref 0.1–1.0)
MONOS PCT: 6 %
Neutro Abs: 4 10*3/uL (ref 1.7–7.7)
Neutrophils Relative %: 75 %

## 2016-06-30 LAB — AMNISURE RUPTURE OF MEMBRANE (ROM) NOT AT ARMC: Amnisure ROM: NEGATIVE

## 2016-06-30 LAB — URINALYSIS, ROUTINE W REFLEX MICROSCOPIC
Bilirubin Urine: NEGATIVE
Glucose, UA: NEGATIVE mg/dL
Ketones, ur: NEGATIVE mg/dL
Leukocytes, UA: NEGATIVE
NITRITE: NEGATIVE
PROTEIN: NEGATIVE mg/dL
SPECIFIC GRAVITY, URINE: 1.02 (ref 1.005–1.030)
pH: 6 (ref 5.0–8.0)

## 2016-06-30 LAB — RPR: RPR: NONREACTIVE

## 2016-06-30 LAB — RAPID URINE DRUG SCREEN, HOSP PERFORMED
AMPHETAMINES: NOT DETECTED
Barbiturates: NOT DETECTED
Benzodiazepines: NOT DETECTED
COCAINE: POSITIVE — AB
OPIATES: NOT DETECTED
Tetrahydrocannabinol: NOT DETECTED

## 2016-06-30 LAB — URINE MICROSCOPIC-ADD ON

## 2016-06-30 LAB — WET PREP, GENITAL
Sperm: NONE SEEN
Trich, Wet Prep: NONE SEEN
Yeast Wet Prep HPF POC: NONE SEEN

## 2016-06-30 NOTE — Discharge Instructions (Signed)
Stimulant Use Disorder-Cocaine °Cocaine is one of a group of powerful drugs called stimulants. Cocaine has medical uses for stopping nosebleeds and for pain control before minor nose or dental surgery. However, cocaine is misused because of the effects that it produces. These effects include:  °· A feeling of extreme pleasure. °· Alertness. °· High energy. °Common street names for cocaine include coke, crack, blow, snow, and nose candy. Cocaine is snorted, dissolved in water and injected, or smoked.  °Stimulants are addictive because they activate regions of the brain that produce both the pleasurable sensation of "reward" and psychological dependence. Together, these actions account for loss of control and the rapid development of drug dependence. This means you become ill without the drug (withdrawal) and need to keep using it to function.  °Stimulant use disorder is use of stimulants that disrupts your daily life. It disrupts relationships with family and friends and how you do your job. Cocaine increases your blood pressure and heart rate. It can cause a heart attack or stroke. Cocaine can also cause death from irregular heart rate or seizures. °SYMPTOMS °Symptoms of stimulant use disorder with cocaine include: °· Use of cocaine in larger amounts or over a longer period of time than intended. °· Unsuccessful attempts to cut down or control cocaine use. °· A lot of time spent obtaining, using, or recovering from the effects of cocaine. °· A strong desire or urge to use cocaine (craving). °· Continued use of cocaine in spite of major problems at work, school, or home because of use. °· Continued use of cocaine in spite of relationship problems because of use. °· Giving up or cutting down on important life activities because of cocaine use. °· Use of cocaine over and over in situations when it is physically hazardous, such as driving a car. °· Continued use of cocaine in spite of a physical problem that is likely  related to use. Physical problems can include: °¨ Malnutrition. °¨ Nosebleeds. °¨ Chest pain. °¨ High blood pressure. °¨ A hole that develops between the part of your nose that separates your nostrils (perforated nasal septum). °¨ Lung and kidney damage. °· Continued use of cocaine in spite of a mental problem that is likely related to use. Mental problems can include: °¨ Schizophrenia-like symptoms. °¨ Depression. °¨ Bipolar mood swings. °¨ Anxiety. °¨ Sleep problems. °· Need to use more and more cocaine to get the same effect, or lessened effect over time with use of the same amount of cocaine (tolerance). °· Having withdrawal symptoms when cocaine use is stopped, or using cocaine to reduce or avoid withdrawal symptoms. Withdrawal symptoms include: °¨ Depressed or irritable mood. °¨ Low energy or restlessness. °¨ Bad dreams. °¨ Poor or excessive sleep. °¨ Increased appetite. °DIAGNOSIS °Stimulant use disorder is diagnosed by your health care provider. You may be asked questions about your cocaine use and how it affects your life. A physical exam may be done. A drug screen may be ordered. You may be referred to a mental health professional. The diagnosis of stimulant use disorder requires at least two symptoms within 12 months. The type of stimulant use disorder depends on the number of signs and symptoms you have. The type may be: °· Mild. Two or three signs and symptoms. °· Moderate. Four or five signs and symptoms. °· Severe. Six or more signs and symptoms. °TREATMENT °Treatment for stimulant use disorder is usually provided by mental health professionals with training in substance use disorders. The following options are available: °·   Counseling or talk therapy. Talk therapy addresses the reasons you use cocaine and ways to keep you from using again. Goals of talk therapy include:  Identifying and avoiding triggers for use.  Handling cravings.  Replacing use with healthy activities.  Support groups.  Support groups provide emotional support, advice, and guidance.  Medicine. Certain medicines may decrease cocaine cravings or withdrawal symptoms. HOME CARE INSTRUCTIONS  Take medicines only as directed by your health care provider.  Identify the people and activities that trigger your cocaine use and avoid them.  Keep all follow-up visits as directed by your health care provider. SEEK MEDICAL CARE IF:  Your symptoms get worse or you relapse.  You are not able to take medicines as directed. SEEK IMMEDIATE MEDICAL CARE IF:  You have serious thoughts about hurting yourself or others.  You have a seizure, chest pain, sudden weakness, or loss of speech or vision. FOR MORE INFORMATION  National Institute on Drug Abuse: http://www.price-smith.com/www.drugabuse.gov  Substance Abuse and Mental Health Services Administration: SkateOasis.com.ptwww.samhsa.gov   This information is not intended to replace advice given to you by your health care provider. Make sure you discuss any questions you have with your health care provider.   Document Released: 08/07/2000 Document Revised: 08/31/2014 Document Reviewed: 08/23/2013 Elsevier Interactive Patient Education 2016 [employer] Second Trimester of Pregnancy The second trimester is from week 13 through week 28, month 4 through 6. This is often the time in pregnancy that you feel your best. Often times, morning sickness has lessened or quit. You may have more energy, and you may get hungry more often. Your unborn baby (fetus) is growing rapidly. At the end of the sixth month, he or she is about 9 inches long and weighs about 1 pounds. You will likely feel the baby move (quickening) between 18 and 20 weeks of pregnancy. HOME CARE   Avoid all smoking, herbs, and alcohol. Avoid drugs not approved by your doctor.  Do not use any tobacco products, including cigarettes, chewing tobacco, and electronic cigarettes. If you need help quitting, ask your doctor. You may get counseling or other  support to help you quit.  Only take medicine as told by your doctor. Some medicines are safe and some are not during pregnancy.  Exercise only as told by your doctor. Stop exercising if you start having cramps.  Eat regular, healthy meals.  Wear a good support bra if your breasts are tender.  Do not use hot tubs, steam rooms, or saunas.  Wear your seat belt when driving.  Avoid raw meat, uncooked cheese, and liter boxes and soil used by cats.  Take your prenatal vitamins.  Take 1500-2000 milligrams of calcium daily starting at the 20th week of pregnancy until you deliver your baby.  Try taking medicine that helps you poop (stool softener) as needed, and if your doctor approves. Eat more fiber by eating fresh fruit, vegetables, and whole grains. Drink enough fluids to keep your pee (urine) clear or pale yellow.  Take warm water baths (sitz baths) to soothe pain or discomfort caused by hemorrhoids. Use hemorrhoid cream if your doctor approves.  If you have puffy, bulging veins (varicose veins), wear support hose. Raise (elevate) your feet for 15 minutes, 3-4 times a day. Limit salt in your diet.  Avoid heavy lifting, wear low heals, and sit up straight.  Rest with your legs raised if you have leg cramps or low back pain.  Visit your dentist if you have not gone during your pregnancy.  Use a soft toothbrush to brush your teeth. Be gentle when you floss.  You can have sex (intercourse) unless your doctor tells you not to.  Go to your doctor visits. GET HELP IF:   You feel dizzy.  You have mild cramps or pressure in your lower belly (abdomen).  You have a nagging pain in your belly area.  You continue to feel sick to your stomach (nauseous), throw up (vomit), or have watery poop (diarrhea).  You have bad smelling fluid coming from your vagina.  You have pain with peeing (urination). GET HELP RIGHT AWAY IF:   You have a fever.  You are leaking fluid from your  vagina.  You have spotting or bleeding from your vagina.  You have severe belly cramping or pain.  You lose or gain weight rapidly.  You have trouble catching your breath and have chest pain.  You notice sudden or extreme puffiness (swelling) of your face, hands, ankles, feet, or legs.  You have not felt the baby move in over an hour.  You have severe headaches that do not go away with medicine.  You have vision changes.   This information is not intended to replace advice given to you by your health care provider. Make sure you discuss any questions you have with your health care provider.   Document Released: 11/04/2009 Document Revised: 08/31/2014 Document Reviewed: 10/11/2012 Elsevier Interactive Patient Education [employer] Elsevier Inc.

## 2016-06-30 NOTE — Progress Notes (Signed)
CSW received consult due to mental health and substance use concerns.  CSW is familiar with patient from her loss in 2015.  CSW met with patient in her MAU room to offer support and assistance.  When CSW arrived, patient had the covers over her head.  CSW asked her to uncover her face so that we could talk.  Patient did so willingly and informed CSW that she is hungry.  Patient told CSW what she would like to eat and CSW ordered for her.  CSW also brought juice to patient, who appeared very thirsty.   Patient began to cry.  After gentle probing with support, CSW was able to get patient to talk.  She reports that she last used cocaine this morning, but that she is newly in a treatment program through [facility].  She states she has been there less than a week.  She reports that she no longer lives with "[name]" Marya Fossa), the father of the daughter she lost 2 years ago.  She states that they communicate on a daily basis and that he is her only support, but that they broke up after their daughter passed.   Patient told CSW that she knows she is pregnant, but does not know who the father is.  She does not want [name] to find out she is pregnant because she feels "stupid" that she doesn't know who the father is.  She states she had been in a relationship since [place], but that also she has been "prostituting to get high."  (CSW left message at the [facility]).   Patient states she does not want to be pregnant and wishes to terminate.  CSW had difficulty getting patient to elaborate on her feelings regarding termination and informed her that if she is interested in this, she will have to act quickly given how far along she is.  CSW contacted the [facility] and left a message.  CSW provided patient with brochure to their agency as a place patient can receive support in making her decision.  Patient states she does not have any money for an abortion, however, informed  CSW that she receives SSI because of her leg and because she has dx's of Bipolar and Schizophrenia.  She reports that she is also HIV positive.  CSW is not aware of financial resources for abortion, but again suggested that she speak with the [facility]. Patient reports that she is taking Zoloft and Latuda to treat her mental health concerns.  She receives medication management at [facility] and has counseling through her substance abuse treatment program through [facility].  Patient states she takes Truvada and Evotaz for HIV.  CSW commended patient for taking her prescribed medications and for being in an SA treatment program.  She feels she is well connected to services.  CSW encouraged her to continue in these services.   CSW informed patient of the importance of prenatal care, should she decide to continue the pregnancy, and also discussed the option of making an adoption plan if patient does not desire or feel capable to parent this child.  Patient was open to this discussion.  CSW asked her to contact CSW if she would like to talk more about this when she comes to her appointments.  Patient states she has transportation through the city bus system.    Patient seemed appreciative of support offered by CSW.  She became very sleepy and CSW  could not continue talking with patient.  Patient given CSW's contact number.

## 2016-06-30 NOTE — MAU Note (Signed)
G3P2 @ ???, presents to triage for r/o SROM. No fluid noted in the undergarment. Denies bleeding. Pt states pain in abdomen, back, and peri area. States pain is 8/10. Resting quietly with eyes closed. See flow sheet for detail of Vs. MD notified.

## 2016-06-30 NOTE — MAU Provider Note (Signed)
History     CSN: [id number]  Arrival date and time: 06/30/16 [id number]   First Provider Initiated Contact with Patient 06/30/16 [phone]      Chief Complaint  Patient presents with  . Vaginal Discharge   Tamara Briggs is a 30 y.o. [id number] at Unknown who presents today with possible leaking fluid. She states that she had some watery discharge. She had some pain earlier, but none now. She reports that she was using crack cocaine prior to arrival. Difficult to obtain history. Patient is very sleepy.    Vaginal Discharge  The patient's primary symptoms include vaginal discharge. This is a new problem. The current episode started today. The problem occurs intermittently. The problem has been unchanged. Pain severity now: I was having pain, but not any more.  She is pregnant. Pertinent negatives include no abdominal pain, chills, constipation, diarrhea, dysuria, fever, frequency, nausea, urgency or vomiting. The vaginal discharge was watery and copious. There has been no bleeding. Nothing aggravates the symptoms. She has tried nothing for the symptoms. Sexual activity: No recent intercourse.  Menstrual history: LMP: unsure.     Past Medical History:  Diagnosis Date  . Anxiety   . Bipolar disorder (HCC)   . Crack cocaine use   . Depression   . 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 Infection   . Otitis media of left ear 03/15/2013  . Schizophrenia (HCC)   . 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 Tardive dyskinesia 06/13/2014    Past Surgical History:  Procedure Laterality Date  . CESAREAN SECTION N/A 12/24/2013   Procedure: CESAREAN SECTION;  Surgeon: Lesly DukesKelly H Leggett, MD;  Location: [facility];  Service: Obstetrics;  Laterality: N/A;  . COLPOSCOPY  2009  . DENTAL RESTORATION/EXTRACTION WITH X-RAY      Family History  Problem Relation Age of Onset  . Diabetes Mother   . High blood pressure Mother   . Cancer Father     Social History  Substance Use Topics  . Smoking status: Current Every Day  Smoker    Packs/day: 0.10    Years: 3.00    Types: Cigarettes  . Smokeless tobacco: Never Used     Comment: cutting back, 1 cigs/day  . Alcohol use No    Allergies: No Known Allergies  Prescriptions Prior to Admission  Medication Sig Dispense Refill Last Dose  . atazanavir-cobicistat (EVOTAZ) 300-150 MG tablet Take 1 tablet by mouth daily. Swallow whole. Do NOT crush, cut or chew tablet. Take with food. 30 tablet 11 Unknown at Unknown time  . benztropine (COGENTIN) 0.5 MG tablet Take 1 tablet (0.5 mg total) by mouth 2 (two) times daily. 60 tablet 11 Unknown at Unknown time  . emtricitabine-tenofovir (TRUVADA) 200-300 MG tablet Take 1 tablet by mouth daily. 30 tablet 11 Unknown at Unknown time  . ibuprofen (ADVIL,MOTRIN) 600 MG tablet Take 1 tablet (600 mg total) by mouth every 6 (six) hours as needed. 30 tablet 0 Unknown at Unknown time  . LATUDA 40 MG TABS tablet Take 40 mg by mouth daily with breakfast.   2 Unknown at Unknown time  . ondansetron (ZOFRAN-ODT) 8 MG disintegrating tablet Take 1 tablet (8 mg total) by mouth every 8 (eight) hours as needed for nausea or vomiting. 20 tablet 6 Unknown at Unknown time  . sertraline (ZOLOFT) 50 MG tablet Take 1 tablet (50 mg total) by mouth daily. 30 tablet 5 Unknown at Unknown time    Review of Systems  Constitutional: Negative for chills and  fever.  Gastrointestinal: Negative for abdominal pain, constipation, diarrhea, nausea and vomiting.  Genitourinary: Positive for vaginal discharge. Negative for dysuria, frequency and urgency.   Physical Exam   Blood pressure 90/62, pulse 64, temperature 97.8 F (36.6 C), resp. rate 20, SpO2 100 %.  Physical Exam  Nursing note and vitals reviewed. Constitutional: She is oriented to person, place, and time. She appears well-developed and well-nourished. She appears lethargic. No distress.  Cardiovascular: Normal rate.   Respiratory: Effort normal.  GI: Soft. There is no tenderness. There is no  rebound.  Genitourinary:  Genitourinary Comments: Patient became tearful and panicky when I mentioned pelvic exam. Deferred at this time. Patient is ok with collecting swabs.    Neurological: She is oriented to person, place, and time. She appears lethargic.  Skin: Skin is warm and dry.    MAU Course  Procedures Results for orders placed or performed during the hospital encounter of 06/30/16 (from the past 24 hour(s))  Wet prep, genital     Status: Abnormal   Collection Time: 06/30/16  7:20 AM  Result Value Ref Range   Yeast Wet Prep HPF POC NONE SEEN NONE SEEN   Trich, Wet Prep NONE SEEN NONE SEEN   Clue Cells Wet Prep HPF POC PRESENT (A) NONE SEEN   WBC, Wet Prep HPF POC FEW (A) NONE SEEN   Sperm NONE SEEN   Amnisure rupture of membrane (rom)not at [facility]     Status: None   Collection Time: 06/30/16  7:20 AM  Result Value Ref Range   Amnisure ROM NEGATIVE   CBC     Status: Abnormal   Collection Time: 06/30/16  7:29 AM  Result Value Ref Range   WBC 5.4 4.0 - 10.5 K/uL   RBC 3.55 (L) 3.87 - 5.11 MIL/uL   Hemoglobin 10.0 (L) 12.0 - 15.0 g/dL   HCT 16.1 (L) 09.6 - 04.5 %   MCV 83.9 78.0 - 100.0 fL   MCH 28.2 26.0 - 34.0 pg   MCHC 33.6 30.0 - 36.0 g/dL   RDW 40.9 81.1 - 91.4 %   Platelets 164 150 - 400 K/uL  Differential     Status: None   Collection Time: 06/30/16  7:29 AM  Result Value Ref Range   Neutrophils Relative % 75 %   Neutro Abs 4.0 1.7 - 7.7 K/uL   Lymphocytes Relative 18 %   Lymphs Abs 1.0 0.7 - 4.0 K/uL   Monocytes Relative 6 %   Monocytes Absolute 0.3 0.1 - 1.0 K/uL   Eosinophils Relative 1 %   Eosinophils Absolute 0.1 0.0 - 0.7 K/uL   Basophils Relative 0 %   Basophils Absolute 0.0 0.0 - 0.1 K/uL  Urinalysis, Routine w reflex microscopic (not at [facility])     Status: Abnormal   Collection Time: 06/30/16  8:25 AM  Result Value Ref Range   Color, Urine YELLOW YELLOW   APPearance HAZY (A) CLEAR   Specific Gravity, Urine 1.020 1.005 - 1.030   pH 6.0 5.0 - 8.0    Glucose, UA NEGATIVE NEGATIVE mg/dL   Hgb urine dipstick TRACE (A) NEGATIVE   Bilirubin Urine NEGATIVE NEGATIVE   Ketones, ur NEGATIVE NEGATIVE mg/dL   Protein, ur NEGATIVE NEGATIVE mg/dL   Nitrite NEGATIVE NEGATIVE   Leukocytes, UA NEGATIVE NEGATIVE  Urine rapid drug screen (hosp performed)     Status: Abnormal   Collection Time: 06/30/16  8:25 AM  Result Value Ref Range   Opiates NONE DETECTED NONE DETECTED  Cocaine POSITIVE (A) NONE DETECTED   Benzodiazepines NONE DETECTED NONE DETECTED   Amphetamines NONE DETECTED NONE DETECTED   Tetrahydrocannabinol NONE DETECTED NONE DETECTED   Barbiturates NONE DETECTED NONE DETECTED  Urine microscopic-add on     Status: Abnormal   Collection Time: 06/30/16  8:25 AM  Result Value Ref Range   Squamous Epithelial / LPF 0-5 (A) NONE SEEN   WBC, UA 0-5 0 - 5 WBC/hpf   RBC / HPF 0-5 0 - 5 RBC/hpf   Bacteria, UA FEW (A) NONE SEEN   Urine-Other MUCOUS PRESENT     MDM  Will get prenatal panel and US today. Unsure if patient will reliably show up for prenatal care.    0800 Care turned over to E. Darlena Koval NP  Tawnya CrookHogan, Heather Donovan  06/30/16 7:54 AM   Ultrasound shows SIUP @ 19.1wks. AFV subjectively normal. Placenta above os.  UDS + cocaine CSW at bedside speaking with patient Amnisure negative Pt states she wants to be discharged Assessment and Plan  A:  1. Vaginal discharge during pregnancy in second trimester   2. Unsure of LMP (last menstrual period) as reason for ultrasound scan   3. Abdominal pain during pregnancy in second trimester   4. Antenatal screening for malformation using ultrasonics   5. Drug use affecting pregnancy in second trimester   6. [redacted] weeks gestation of pregnancy   7. Abdominal pain in pregnancy   8. Cocaine use complicating pregnancy, second trimester    P: Discharge home [facility] if pregnancy continues Pt considering termination  Continue substance abuse  program Keep f/u with PCP Discussed reasons to return to MAU GC/CT, urine culture, & prenatal labs pending  Judeth HornErin Levon Boettcher, NP  06/30/2016 11:25 AM

## 2016-07-01 LAB — CULTURE, OB URINE

## 2016-07-01 LAB — GC/CHLAMYDIA PROBE AMP (~~LOC~~) NOT AT ARMC
Chlamydia: NEGATIVE
NEISSERIA GONORRHEA: NEGATIVE

## 2016-07-01 LAB — RUBELLA SCREEN: Rubella: 2.74 index (ref >0.99)

## 2016-07-02 LAB — HIV ANTIBODY (ROUTINE TESTING W REFLEX)

## 2016-07-02 LAB — HIV 1/2 AB DIFFERENTIATION
HIV 1 AB: POSITIVE — AB
HIV 2 Ab: NEGATIVE

## 2016-07-08 ENCOUNTER — Ambulatory Visit (INDEPENDENT_AMBULATORY_CARE_PROVIDER_SITE_OTHER): Payer: Medicaid Other | Admitting: Internal Medicine

## 2016-07-08 ENCOUNTER — Encounter: Payer: Self-pay | Admitting: Internal Medicine

## 2016-07-08 VITALS — BP 133/68 | HR 77 | Temp 98.3°F | Wt 98.0 lb

## 2016-07-08 DIAGNOSIS — B20 Human immunodeficiency virus [HIV] disease: Secondary | ICD-10-CM

## 2016-07-08 DIAGNOSIS — R110 Nausea: Secondary | ICD-10-CM | POA: Diagnosis not present

## 2016-07-08 DIAGNOSIS — F209 Schizophrenia, unspecified: Secondary | ICD-10-CM | POA: Diagnosis not present

## 2016-07-08 DIAGNOSIS — Z3492 Encounter for supervision of normal pregnancy, unspecified, second trimester: Secondary | ICD-10-CM

## 2016-07-08 DIAGNOSIS — F1910 Other psychoactive substance abuse, uncomplicated: Secondary | ICD-10-CM

## 2016-07-08 LAB — COMPLETE METABOLIC PANEL WITH GFR
ALT: 11 U/L (ref 6–29)
AST: 17 U/L (ref 10–30)
Albumin: 3.6 g/dL (ref 3.6–5.1)
Alkaline Phosphatase: 64 U/L (ref 33–115)
BUN: 9 mg/dL (ref 7–25)
CHLORIDE: 102 mmol/L (ref 98–110)
CO2: 24 mmol/L (ref 20–31)
Calcium: 8.6 mg/dL (ref 8.6–10.2)
Creat: 0.6 mg/dL (ref 0.50–1.10)
GFR, Est African American: >89 mL/min (ref >=60)
GFR, Est Non African American: >89 mL/min (ref >=60)
GLUCOSE: 60 mg/dL — AB (ref 65–99)
POTASSIUM: 3.4 mmol/L — AB (ref 3.5–5.3)
SODIUM: 135 mmol/L (ref 135–146)
Total Bilirubin: 0.2 mg/dL (ref 0.2–1.2)
Total Protein: 7 g/dL (ref 6.1–8.1)

## 2016-07-08 LAB — CBC WITH DIFFERENTIAL/PLATELET
Basophils Absolute: 0 cells/uL (ref 0–200)
Basophils Relative: 0 %
EOS PCT: 2 %
Eosinophils Absolute: 94 cells/uL (ref 15–500)
HCT: 29.8 % — ABNORMAL LOW (ref 35.0–45.0)
HEMOGLOBIN: 10 g/dL — AB (ref 11.7–15.5)
LYMPHS ABS: 1645 {cells}/uL (ref 850–3900)
LYMPHS PCT: 35 %
MCH: 28.7 pg (ref 27.0–33.0)
MCHC: 33.6 g/dL (ref 32.0–36.0)
MCV: 85.6 fL (ref 80.0–100.0)
MONOS PCT: 11 %
MPV: 8.8 fL (ref 7.5–12.5)
Monocytes Absolute: 517 cells/uL (ref 200–950)
NEUTROS PCT: 52 %
Neutro Abs: 2444 cells/uL (ref 1500–7800)
PLATELETS: 186 10*3/uL (ref 140–400)
RBC: 3.48 MIL/uL — AB (ref 3.80–5.10)
RDW: 15.9 % — AB (ref 11.0–15.0)
WBC: 4.7 10*3/uL (ref 3.8–10.8)

## 2016-07-08 NOTE — Progress Notes (Signed)
RFV: follow up for hiv disease  Patient ID: Tamara Briggs, female   DOB: 02/07/1986, 30 y.o.   MRN: [id number]  HPI Tamara Briggs is a 30yo F with hiv disease, CD 4 count of 600/VL 188 in Mar 2017, schizophrenia, and cocaine abuse. She is currently in her [redacted] wk gestation of pregnancy. She has not been taking prenatals. She is in a detox program. She reports getting mixed messages from her PCP who asked her to stop taking her medicaiton, which she interpreted stopping her hiv meds, and psychiatric medications.  She had restarted her hiv regimen but declined getting blood work at last appt. She does report that at times was taking her meds inconsistently. She is still not back on her latuda (recently decreased dose due to her pregnancy, but has taken it during her past pregnancy)  Outpatient Encounter Prescriptions as of 07/08/2016  Medication Sig  . atazanavir-cobicistat (EVOTAZ) 300-150 MG tablet Take 1 tablet by mouth daily. Swallow whole. Do NOT crush, cut or chew tablet. Take with food.  . benztropine (COGENTIN) 0.5 MG tablet Take 1 tablet (0.5 mg total) by mouth 2 (two) times daily.  Marland Kitchen. emtricitabine-tenofovir (TRUVADA) 200-300 MG tablet Take 1 tablet by mouth daily.  Marland Kitchen. LATUDA 40 MG TABS tablet Take 40 mg by mouth daily with breakfast.   . ondansetron (ZOFRAN-ODT) 8 MG disintegrating tablet Take 1 tablet (8 mg total) by mouth every 8 (eight) hours as needed for nausea or vomiting.  . sertraline (ZOLOFT) 50 MG tablet Take 1 tablet (50 mg total) by mouth daily.   No facility-administered encounter medications on file as of 07/08/2016.      Patient Active Problem List   Diagnosis Date Noted  . Abnormality of gait 07/15/2015  . Knee pain, left 07/15/2015  . 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 Tardive dyskinesia 06/13/2014  . S/P C-section 12/25/2013  . Preterm premature rupture of membranes (PPROM) delivered, current hospitalization 12/14/2013  . Normocytic anemia 12/14/2013  .  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 Abnormal findings on antenatal screening 10/27/2013  . Maternal HIV positive complicating pregnancy in second trimester, antepartum (HCC) 10/18/2013  . Schizophrenia (HCC) 10/18/2013  . Pyelonephritis 07/21/2013  . Protein-calorie malnutrition, severe (HCC) 03/16/2013  . Tobacco abuse 03/15/2013  . h/o crack Cocaine abuse 03/15/2013  . h/o oral Thrush 03/15/2013  . HIV (human immunodeficiency virus infection) (HCC) 07/27/2011  . Chlamydia and Gonorrhea 07/27/2011  . H/O abnormal cervical Papanicolaou smear 07/26/2008     Health Maintenance Due  Topic Date Due  . INFLUENZA VACCINE  03/24/2016     Review of Systems Not as anxious as previous visit. No fevers or chills Physical Exam   BP 133/68   Pulse 77   Temp 98.3 F (36.8 C) (Oral)   Wt 98 lb (44.5 kg)   LMP  (LMP Unknown)   BMI 16.82 kg/m  Physical Exam  Constitutional:  oriented to person, place, and time. appears well-developed and well-nourished. No distress.  HENT: Ivy/AT, PERRLA, no scleral icterus Mouth/Throat: Oropharynx is clear and moist. No oropharyngeal exudate.  Cardiovascular: Normal rate, regular rhythm and normal heart sounds. Exam reveals no gallop and no friction rub.  No murmur heard.  Pulmonary/Chest: Effort normal and breath sounds normal. No respiratory distress.  has no wheezes.  Neck = supple, no nuchal rigidity Abdominal: Soft. Bowel sounds are normal.  exhibits no distension. There is no tenderness.  Lymphadenopathy: no cervical adenopathy. No axillary adenopathy Neurological: alert and oriented  to person, place, and time.  Skin: Skin is warm and dry. No rash noted. No erythema.  Psychiatric: a normal mood and affect.  behavior is normal. Slightly pressured speech  Lab Results  Component Value Date   CD4TCELL 35 10/31/2015   Lab Results  Component Value Date   CD4TABS 600 10/31/2015   CD4TABS 740 06/11/2015   CD4TABS 460 11/27/2014   Lab  Results  Component Value Date   HIV1RNAQUANT 188 (H) 10/31/2015   Lab Results  Component Value Date   HEPBSAB POS (A) 07/09/2011   No results found for: RPR  CBC Lab Results  Component Value Date   WBC 5.4 06/30/2016   RBC 3.55 (L) 06/30/2016   HGB 10.0 (L) 06/30/2016   HCT 29.8 (L) 06/30/2016   PLT 164 06/30/2016   MCV 83.9 06/30/2016   MCH 28.2 06/30/2016   MCHC 33.6 06/30/2016   RDW 14.6 06/30/2016   LYMPHSABS 1.0 06/30/2016   MONOABS 0.3 06/30/2016   EOSABS 0.1 06/30/2016   BASOSABS 0.0 06/30/2016   BMET Lab Results  Component Value Date   NA 141 10/31/2015   K 4.1 10/31/2015   CL 106 10/31/2015   CO2 28 10/31/2015   GLUCOSE 91 10/31/2015   BUN 21 10/31/2015   CREATININE 0.98 10/31/2015   CALCIUM 8.8 10/31/2015   GFRNONAA >89 11/27/2014   GFRAA >89 11/27/2014     Assessment and Plan  hiv disease = currently on truvada and evotaz. We will have her come in next week to see pharmacy to change her to atazanavir and ritonavir bid dosing plus truvada to adjust for her 2nd trimester of her pregnancy  Schizophrenia = asked her to get reconnected with [facility]. She would benefit to be back on latuda  Drug abuse = recommend to continue with her residential treatment program.  Pregnancy = will get her access to prenatal gummies   Nausea = continue with odansetron as needed

## 2016-07-09 LAB — T-HELPER CELL (CD4) - (RCID CLINIC ONLY)
CD4 T CELL ABS: 410 /uL (ref 400–2700)
CD4 T CELL HELPER: 26 % — AB (ref 33–55)

## 2016-07-10 LAB — HIV-1 RNA ULTRAQUANT REFLEX TO GENTYP+
HIV 1 RNA Quant: 5552 copies/mL — ABNORMAL HIGH (ref <20)
HIV-1 RNA Quant, Log: 3.74 Log copies/mL — ABNORMAL HIGH (ref <1.30)

## 2016-07-20 LAB — HIV-1 GENOTYPR PLUS

## 2016-07-22 ENCOUNTER — Encounter: Payer: Medicaid Other | Admitting: Obstetrics and Gynecology

## 2016-07-22 ENCOUNTER — Ambulatory Visit: Payer: Medicaid Other

## 2016-07-27 ENCOUNTER — Telehealth: Payer: Self-pay | Admitting: *Deleted

## 2016-07-27 NOTE — Telephone Encounter (Signed)
[facility] a [facility] address and wants to know if I can start to come back out and see her. Tamara Briggs stated she is in a substance abuse program M-F from 9 to 1 but will be free after that. RN confirmed the address we have on file and [facility].

## 2016-07-28 ENCOUNTER — Ambulatory Visit: Payer: Self-pay | Admitting: *Deleted

## 2016-07-28 ENCOUNTER — Telehealth: Payer: Self-pay | Admitting: *Deleted

## 2016-07-28 DIAGNOSIS — O0000 Abdominal pregnancy without intrauterine pregnancy: Secondary | ICD-10-CM

## 2016-07-28 DIAGNOSIS — B20 Human immunodeficiency virus [HIV] disease: Secondary | ICD-10-CM

## 2016-07-28 NOTE — Telephone Encounter (Signed)
[facility] not [facility] yet for our appt. Betzabeth asked if I could give [facility]. RN offered Kamry 2 bus passes to ensure she can get their. Sherral was appreciative and stated she will give me a call once she gets home

## 2016-07-29 ENCOUNTER — Ambulatory Visit: Payer: Self-pay | Admitting: *Deleted

## 2016-07-29 VITALS — BP 124/62 | HR 68

## 2016-07-29 DIAGNOSIS — B20 Human immunodeficiency virus [HIV] disease: Secondary | ICD-10-CM

## 2016-08-06 ENCOUNTER — Other Ambulatory Visit (HOSPITAL_COMMUNITY)
Admission: RE | Admit: 2016-08-06 | Discharge: 2016-08-06 | Disposition: A | Discharge status: Home / Self Care | Payer: Medicaid Other | Source: Ambulatory Visit | Attending: Certified Nurse Midwife | Admitting: Certified Nurse Midwife

## 2016-08-06 ENCOUNTER — Other Ambulatory Visit: Payer: Self-pay | Admitting: Certified Nurse Midwife

## 2016-08-06 ENCOUNTER — Encounter: Payer: Self-pay | Admitting: Obstetrics

## 2016-08-06 ENCOUNTER — Ambulatory Visit (INDEPENDENT_AMBULATORY_CARE_PROVIDER_SITE_OTHER): Payer: Medicaid Other | Admitting: Certified Nurse Midwife

## 2016-08-06 VITALS — BP 116/78 | HR 90 | Temp 99.8°F | Wt 98.5 lb

## 2016-08-06 DIAGNOSIS — Z720 Tobacco use: Secondary | ICD-10-CM

## 2016-08-06 DIAGNOSIS — O99342 Other mental disorders complicating pregnancy, second trimester: Secondary | ICD-10-CM | POA: Diagnosis not present

## 2016-08-06 DIAGNOSIS — F1410 Cocaine abuse, uncomplicated: Secondary | ICD-10-CM

## 2016-08-06 DIAGNOSIS — F209 Schizophrenia, unspecified: Secondary | ICD-10-CM | POA: Diagnosis not present

## 2016-08-06 DIAGNOSIS — Z87898 Personal history of other specified conditions: Secondary | ICD-10-CM | POA: Diagnosis present

## 2016-08-06 DIAGNOSIS — O0992 Supervision of high risk pregnancy, unspecified, second trimester: Secondary | ICD-10-CM

## 2016-08-06 DIAGNOSIS — O98712 Human immunodeficiency virus [HIV] disease complicating pregnancy, second trimester: Secondary | ICD-10-CM | POA: Diagnosis not present

## 2016-08-06 DIAGNOSIS — O09522 Supervision of elderly multigravida, second trimester: Secondary | ICD-10-CM

## 2016-08-06 DIAGNOSIS — O0933 Supervision of pregnancy with insufficient antenatal care, third trimester: Secondary | ICD-10-CM | POA: Insufficient documentation

## 2016-08-06 DIAGNOSIS — O42919 Preterm premature rupture of membranes, unspecified as to length of time between rupture and onset of labor, unspecified trimester: Secondary | ICD-10-CM

## 2016-08-06 DIAGNOSIS — E43 Unspecified severe protein-calorie malnutrition: Secondary | ICD-10-CM

## 2016-08-06 DIAGNOSIS — N12 Tubulo-interstitial nephritis, not specified as acute or chronic: Secondary | ICD-10-CM

## 2016-08-06 DIAGNOSIS — B20 Human immunodeficiency virus [HIV] disease: Secondary | ICD-10-CM

## 2016-08-06 DIAGNOSIS — Z21 Asymptomatic human immunodeficiency virus [HIV] infection status: Secondary | ICD-10-CM | POA: Diagnosis not present

## 2016-08-06 DIAGNOSIS — O0930 Supervision of pregnancy with insufficient antenatal care, unspecified trimester: Secondary | ICD-10-CM | POA: Insufficient documentation

## 2016-08-06 DIAGNOSIS — Z87728 Personal history of other specified (corrected) congenital malformations of nervous system and sense organs: Secondary | ICD-10-CM

## 2016-08-06 DIAGNOSIS — G2401 Drug induced subacute dyskinesia: Secondary | ICD-10-CM

## 2016-08-06 DIAGNOSIS — D649 Anemia, unspecified: Secondary | ICD-10-CM

## 2016-08-06 DIAGNOSIS — Z8742 Personal history of other diseases of the female genital tract: Secondary | ICD-10-CM

## 2016-08-06 DIAGNOSIS — Z98891 History of uterine scar from previous surgery: Secondary | ICD-10-CM

## 2016-08-06 DIAGNOSIS — G2119 Other drug induced secondary parkinsonism: Secondary | ICD-10-CM

## 2016-08-06 DIAGNOSIS — O0990 Supervision of high risk pregnancy, unspecified, unspecified trimester: Secondary | ICD-10-CM

## 2016-08-06 MED ORDER — OB COMPLETE PETITE 35-5-1-200 MG PO CAPS: 1.0000 | ORAL_CAPSULE | Freq: Every day | ORAL | 12 refills | Status: DC

## 2016-08-06 NOTE — Addendum Note (Signed)
Addended by: Francene FindersJAMES, QUINETTA C on: 08/06/2016 04:45 PM   Modules accepted: Orders

## 2016-08-06 NOTE — Progress Notes (Signed)
Subjective:    Tamara Briggs is being seen today for her first obstetrical visit.  This is not a planned pregnancy. She is at 6779w3d gestation. Her obstetrical history is significant for HIV positive, previous C-section PLTCS with fetal demise @29  weeks, tobacco abuse, smokes 1/2pk/day.Hx of schizophrenia.  Relationship with FOB: partner . Patient does not intend to breast feed. Pregnancy history fully reviewed.  The information documented in the HPI was reviewed and verified.  Menstrual History: OB History    Gravida Para Term Preterm AB Living   4 2 1 1 1 1    SAB TAB Ectopic Multiple Live Births   1 0 0 0 2       No LMP recorded (lmp unknown). Patient is pregnant.    Past Medical History:  Diagnosis Date  . Anxiety   . Bipolar disorder (HCC)   . Crack cocaine use   . Depression   . 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 Infection   . Otitis media of left ear 03/15/2013  . Schizophrenia (HCC)   . 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 Tardive dyskinesia 06/13/2014    Past Surgical History:  Procedure Laterality Date  . CESAREAN SECTION N/A 12/24/2013   Procedure: CESAREAN SECTION;  Surgeon: Lesly DukesKelly H Leggett, MD;  Location: [facility];  Service: Obstetrics;  Laterality: N/A;  . COLPOSCOPY  2009  . DENTAL RESTORATION/EXTRACTION WITH X-RAY       (Not in a hospital admission) No Known Allergies  Social History  Substance Use Topics  . Smoking status: Current Every Day Smoker    Packs/day: 0.10    Years: 3.00    Types: Cigarettes  . Smokeless tobacco: Never Used     Comment: cutting back, 1 cigs/day  . Alcohol use No    Family History  Problem Relation Age of Onset  . Diabetes Mother   . High blood pressure Mother   . Cancer Father      Review of Systems Constitutional: negative for weight loss Gastrointestinal: negative for vomiting Genitourinary:negative for genital lesions and vaginal discharge and dysuria Musculoskeletal:negative for back pain Behavioral/Psych:  negative for abusive relationship, depression, illegal drug usage and tobacco use    Objective:    BP 116/78   Pulse 90   Temp 99.8 F (37.7 C)   Wt 98 lb 8 oz (44.7 kg)   LMP  (LMP Unknown)   BMI 16.91 kg/m  General Appearance:    Alert, cooperative, no distress, appears stated age  Head:    Normocephalic, without obvious abnormality, atraumatic  Eyes:    PERRL, conjunctiva/corneas clear, EOM's intact, fundi    benign, both eyes  Ears:    Normal TM's and external ear canals, both ears  Nose:   Nares normal, septum midline, mucosa normal, no drainage    or sinus tenderness  Throat:   Lips, mucosa, and tongue normal; teeth and gums normal  Neck:   Supple, symmetrical, trachea midline, no adenopathy;    thyroid:  no enlargement/tenderness/nodules; no carotid   bruit or JVD  Back:     Symmetric, no curvature, ROM normal, no CVA tenderness  Lungs:     Clear to auscultation bilaterally, respirations unlabored  Chest Wall:    No tenderness or deformity   Heart:    Regular rate and rhythm, S1 and S2 normal, no murmur, rub   or gallop  Breast Exam:    No tenderness, masses, or nipple abnormality  Abdomen:     Soft, non-tender,  bowel sounds active all four quadrants,    no masses, no organomegaly  Genitalia:    Normal female without lesion, discharge or tenderness  Extremities:   Extremities normal, atraumatic, no cyanosis or edema  Pulses:   2+ and symmetric all extremities  Skin:   Skin color, texture, turgor normal, no rashes or lesions  Lymph nodes:   Cervical, supraclavicular, and axillary nodes normal  Neurologic:   CNII-XII intact, normal strength, sensation and reflexes    throughout          Cervix:   Long, thick, closed and posterior.  FHR: 155 by doppler.  FH; 25cm.    Lab Review Urine pregnancy test Labs reviewed yes Radiologic studies reviewed yes  Assessment:    Pregnancy at 1349w3d weeks    HIV positive, on anti-retroviral meds, stable  Late to prenatal care @24   weeks  H/O PLTCS  H/O PPROM, fetal demise @29  weeks  Tobacco abuse  H/O cocaine abuse  Schizophrenia  S/P C-section H/O NTD: patient   Plan:     MFM referral orders Prenatal vitamins.  Counseling provided regarding continued use of seat belts, cessation of alcohol consumption, smoking or use of illicit drugs; infection precautions i.e., influenza/TDAP immunizations, toxoplasmosis,CMV, parvovirus, listeria and varicella; workplace safety, exercise during pregnancy; routine dental care, safe medications, sexual activity, hot tubs, saunas, pools, travel, caffeine use, fish and methlymercury, potential toxins, hair treatments, varicose veins Weight gain recommendations per IOM guidelines reviewed: underweight/BMI< 18.5--> gain 28 - 40 lbs; normal weight/BMI 18.5 - 24.9--> gain 25 - 35 lbs; overweight/BMI 25 - 29.9--> gain 15 - 25 lbs; obese/BMI >30->gain  11 - 20 lbs Problem list reviewed and updated. FIRST/CF mutation testing/NIPT/QUAD SCREEN/fragile X/Ashkenazi Jewish population testing/Spinal muscular atrophy discussed: requested. Role of ultrasound in pregnancy discussed; fetal survey: requested. Amniocentesis discussed: not indicated. VBAC calculator score: VBAC consent form provided No orders of the defined types were placed in this encounter.  No orders of the defined types were placed in this encounter.   Follow up in 4 weeks. 50% of 20 min visit spent on counseling and coordination of care.

## 2016-08-06 NOTE — Progress Notes (Signed)
Rn spoke with the patient but was unable to get her over the phone after being at her home and knocking on the door several times. I will leave the home and wait to her back form the patient before returning

## 2016-08-06 NOTE — Progress Notes (Signed)
Patient ID: Tamara Briggs, female   DOB: 08/12/1986, 30 y.o.   MRN: [id number]  Order received on 07/27/2016 by Dr Judyann Munsonynthia Snider to [facility]).  Patient was evaluated on 07/29/2016 with initial contact made on 07/27/16. Patient was consented to care at this time.   Frequency / Duration of [facility] visits, Effective 07/29/2016:  762mo2, 731mo1  4PRN's for complications with disease process/progression, medication changes or concerns   [facility] will assess for learning needs related to diagnosis and treatment regimen, provide education as needed, fill pill box if needed, address any barriers which may be preventing medication compliance, and communicating with care team including physician and case manager.   Individualized Plan Of Care Certification Period of 07/29/2016 to 10/25/2016  a. Type of service(s) and care to be delivered: RN Case Management  b. Frequency and duration of service: Effective 07/29/2016: 352mo2, 1011mo1 4 prns for complications with disease process/progression, barriers to care,  medication changes or concerns   c. Activity restrictions: Pt may be up as tolerated and can safely ambulate without the need for a assistive device   d. Safety Measures: Standard Precautions/Infection Control   e. Service Objectives and Goals: Service Objectives are to assist the pt with HIV medication regimen adherence and staying in care with the Infectious Disease Clinic by identifying barriers to care. RN will address the barriers that are identified by the patient.     Pt's current history of substance abuse and documented Mental Health creates a barrier for the patient. Tamara Briggs is currently in a substance abuse program that provides housing.  Patient Centered Goal is to provide Physical support and  medical guidance to the patient and her unborn baby. RN will coordinate with other community resources including Mental Health for the patient to receive  mental health services so she can began to meet her goal of HIV medication  compliance.  f. Equipment required: No additional equipment needs at this time   g. Functional Limitations: Vision   h. Rehabilitation potential: Guarded   i. Diet and Nutritional Needs: Regular Diet   j. Medications and treatments: Medications have been reconciled and reviewed and are a part of EPIC electronic file   k. Specific therapies if needed: RN   l. Pertinent diagnoses: HIV disease,  Hx of medication NonCompliance, High Risk pregnancy  m. Expected outcome: Guarded     Tamara Briggs states she has plans of keeping her baby, staying in care on her HIV medications and staying clean from crack cocaine use. Tamara Briggs stated she does not have winter clothing and with her belly getting bigger she needs some more clothes and only has summer clothing.  RN reviewed Engineer, building servicesAgency Services, [place] Notice of Arboriculturistrivacy Policy, Home Safety Management Information Booklet. Home Fire Safety Assessment, Fall Risk Assessment and Suicide Risk Assessment was performed. RN also discussed information on a Living Will, Advanced Directives, and Health Care Power of WaymartAttorney. RN and Client/Designated Party educated/reviewed/signed Client Agreement and Consent for Service form along with Patient Rights and Responsibilities statement. RN developed patient specific and centered care plan. RN provided contact information and reviewed how to receive emergency help after hours for schedule changes, reporting of safety issues, falls, concerns or any needs/questions. Standard Precaution and Infection control along with interventions to correct or prevent high risk behaviors instructed to the patient. Client/Caregiver reports understanding and agreement with the above

## 2016-08-06 NOTE — Addendum Note (Signed)
Addended by: Marya LandryFOSTER, SUZANNE D on: 08/06/2016 02:50 PM   Modules accepted: Orders

## 2016-08-06 NOTE — Progress Notes (Signed)
Patient not seen by me. She was seen by Orvilla Cornwallachelle Magalene Mclear, CNM  Charles A. Clearance CootsHarper MD 08-06-16

## 2016-08-10 ENCOUNTER — Ambulatory Visit: Payer: Medicaid Other | Admitting: Internal Medicine

## 2016-08-11 LAB — CYTOLOGY - PAP
ADEQUACY: ABSENT
Diagnosis: NEGATIVE
HPV: NOT DETECTED

## 2016-08-12 ENCOUNTER — Other Ambulatory Visit: Payer: Self-pay | Admitting: Certified Nurse Midwife

## 2016-08-12 DIAGNOSIS — O0992 Supervision of high risk pregnancy, unspecified, second trimester: Secondary | ICD-10-CM

## 2016-08-12 DIAGNOSIS — B9689 Other specified bacterial agents as the cause of diseases classified elsewhere: Secondary | ICD-10-CM

## 2016-08-12 DIAGNOSIS — N760 Acute vaginitis: Principal | ICD-10-CM

## 2016-08-12 DIAGNOSIS — O99820 Streptococcus B carrier state complicating pregnancy: Secondary | ICD-10-CM

## 2016-08-12 DIAGNOSIS — B373 Candidiasis of vulva and vagina: Secondary | ICD-10-CM

## 2016-08-12 DIAGNOSIS — B3731 Acute candidiasis of vulva and vagina: Secondary | ICD-10-CM

## 2016-08-12 LAB — URINE CULTURE, OB REFLEX

## 2016-08-12 LAB — NUSWAB VG+, CANDIDA 6SP
Atopobium vaginae: HIGH Score — AB
BVAB 2: HIGH Score — AB
CANDIDA ALBICANS, NAA: POSITIVE — AB
CANDIDA LUSITANIAE, NAA: NEGATIVE
CANDIDA PARAPSILOSIS, NAA: NEGATIVE
CANDIDA TROPICALIS, NAA: NEGATIVE
Candida glabrata, NAA: NEGATIVE
Candida krusei, NAA: NEGATIVE
Chlamydia trachomatis, NAA: NEGATIVE
MEGASPHAERA 1: HIGH {score} — AB
Neisseria gonorrhoeae, NAA: NEGATIVE
TRICH VAG BY NAA: NEGATIVE

## 2016-08-12 LAB — CULTURE, OB URINE

## 2016-08-12 MED ORDER — FLUCONAZOLE 150 MG PO TABS: 150.0000 mg | ORAL_TABLET | Freq: Once | ORAL | 0 refills | Status: AC

## 2016-08-12 MED ORDER — TERCONAZOLE 0.8 % VA CREA: 1.0000 | TOPICAL_CREAM | Freq: Every day | VAGINAL | 0 refills | Status: DC

## 2016-08-12 MED ORDER — METRONIDAZOLE 500 MG PO TABS: 500.0000 mg | ORAL_TABLET | Freq: Two times a day (BID) | ORAL | 0 refills | Status: DC

## 2016-08-13 LAB — PRENATAL PROFILE I(LABCORP)
ANTIBODY SCREEN: NEGATIVE
BASOS: 0 %
Basophils Absolute: 0 10*3/uL (ref 0.0–0.2)
EOS (ABSOLUTE): 0.1 10*3/uL (ref 0.0–0.4)
EOS: 1 %
HEMATOCRIT: 33.9 % — AB (ref 34.0–46.6)
HEP B S AG: NEGATIVE
Hemoglobin: 11.7 g/dL (ref 11.1–15.9)
IMMATURE GRANS (ABS): 0 10*3/uL (ref 0.0–0.1)
Immature Granulocytes: 0 %
LYMPHS: 11 %
Lymphocytes Absolute: 1.1 10*3/uL (ref 0.7–3.1)
MCH: 30 pg (ref 26.6–33.0)
MCHC: 34.5 g/dL (ref 31.5–35.7)
MCV: 87 fL (ref 79–97)
MONOCYTES: 8 %
Monocytes Absolute: 0.8 10*3/uL (ref 0.1–0.9)
NEUTROS ABS: 8.3 10*3/uL — AB (ref 1.4–7.0)
Neutrophils: 80 %
Platelets: 198 10*3/uL (ref 150–379)
RBC: 3.9 x10E6/uL (ref 3.77–5.28)
RDW: 15.1 % (ref 12.3–15.4)
RH TYPE: POSITIVE
RPR: NONREACTIVE
RUBELLA: 3.4 {index} (ref >0.99)
WBC: 10.4 10*3/uL (ref 3.4–10.8)

## 2016-08-13 LAB — TSH: TSH: 1.65 u[IU]/mL (ref 0.450–4.500)

## 2016-08-13 LAB — HEMOGLOBIN A1C
ESTIMATED AVERAGE GLUCOSE: 103 mg/dL
Hgb A1c MFr Bld: 5.2 % (ref 4.8–5.6)

## 2016-08-13 LAB — HEMOGLOBINOPATHY EVALUATION
HEMOGLOBIN F QUANTITATION: 1.1 % (ref 0.0–2.0)
HGB A: 96.2 % (ref 94.0–98.0)
HGB C: 0 %
HGB S: 0 %
Hemoglobin A2 Quantitation: 2.7 % (ref 0.7–3.1)

## 2016-08-13 LAB — CYSTIC FIBROSIS MUTATION 97: GENE DIS ANAL CARRIER INTERP BLD/T-IMP: NOT DETECTED

## 2016-08-13 LAB — VARICELLA ZOSTER ANTIBODY, IGG: Varicella zoster IgG: 1799 index (ref >165)

## 2016-08-14 ENCOUNTER — Ambulatory Visit (HOSPITAL_COMMUNITY)
Admission: RE | Admit: 2016-08-14 | Discharge: 2016-08-14 | Disposition: A | Discharge status: Home / Self Care | Payer: Medicaid Other | Source: Ambulatory Visit | Attending: Certified Nurse Midwife | Admitting: Certified Nurse Midwife

## 2016-08-14 ENCOUNTER — Encounter (HOSPITAL_COMMUNITY): Payer: Self-pay

## 2016-08-14 ENCOUNTER — Other Ambulatory Visit (HOSPITAL_COMMUNITY): Payer: Self-pay | Admitting: *Deleted

## 2016-08-14 ENCOUNTER — Other Ambulatory Visit: Payer: Self-pay | Admitting: Certified Nurse Midwife

## 2016-08-14 DIAGNOSIS — O09899 Supervision of other high risk pregnancies, unspecified trimester: Secondary | ICD-10-CM

## 2016-08-14 DIAGNOSIS — O99322 Drug use complicating pregnancy, second trimester: Secondary | ICD-10-CM | POA: Diagnosis not present

## 2016-08-14 DIAGNOSIS — O0990 Supervision of high risk pregnancy, unspecified, unspecified trimester: Secondary | ICD-10-CM

## 2016-08-14 DIAGNOSIS — O34219 Maternal care for unspecified type scar from previous cesarean delivery: Secondary | ICD-10-CM

## 2016-08-14 DIAGNOSIS — O99342 Other mental disorders complicating pregnancy, second trimester: Secondary | ICD-10-CM

## 2016-08-14 DIAGNOSIS — Z3A25 25 weeks gestation of pregnancy: Secondary | ICD-10-CM

## 2016-08-14 DIAGNOSIS — O09212 Supervision of pregnancy with history of pre-term labor, second trimester: Secondary | ICD-10-CM | POA: Insufficient documentation

## 2016-08-14 DIAGNOSIS — Z362 Encounter for other antenatal screening follow-up: Secondary | ICD-10-CM

## 2016-08-14 DIAGNOSIS — O09219 Supervision of pregnancy with history of pre-term labor, unspecified trimester: Secondary | ICD-10-CM

## 2016-08-14 DIAGNOSIS — F209 Schizophrenia, unspecified: Secondary | ICD-10-CM | POA: Diagnosis not present

## 2016-08-14 DIAGNOSIS — B20 Human immunodeficiency virus [HIV] disease: Secondary | ICD-10-CM

## 2016-08-14 DIAGNOSIS — O99320 Drug use complicating pregnancy, unspecified trimester: Secondary | ICD-10-CM

## 2016-08-14 LAB — TOXASSURE SELECT 13 (MW), URINE

## 2016-08-14 NOTE — Progress Notes (Signed)
MATERNAL FETAL MEDICINE CONSULT  Patient Name: Tamara Briggs Medical Record Number:  [id number] Date of Birth: 10/12/1985 Requesting Physician Name:  Roe Coombsachelle A Denney, CNM Date of Service: 08/14/2016  Chief Complaint Drug abuse, HIV, and schizophrenia  History of Present Illness Tamara Briggs was seen today secondary to drug abuse, HIV and schizophrenia at the request of Roe Coombsachelle A Denney, CNM.  The patient is a 30 y.o. [id number],[id number],at 7254w4d with an EDD of 11/23/2016, by Ultrasound dating method.  She has a history of both cocaine and marijuana abuse.  She last used cocaine approximately 2 weeks ago, and uses marijuana daily.  She is currently enrolled in a drug rehabilitation program.  She is attempting to decrease her marijuana and tobacco use.  Tamara Briggs has a several year history of HIV.  [facility] and was most recently seen on 07/08/16.  She is currently taking Evotaz and Truvada under their direction.  She has restarted Latuda, which she stopped when learning she was pregnant, for her schizophrenia.  [facility] so far this pregnancy.  She plans on having a "walk-in" appointment next week.  She reports some pelvic pain and pressure, but denies vaginal bleeding, loss of fluid, or contractions.  Her fetus is active.  Review of Systems Pertinent items are noted in HPI.  Patient History OB History  Gravida Para Term Preterm AB Living  4 2 1 1 1 1   SAB TAB Ectopic Multiple Live Births  1 0 0 0 2    # Outcome Date GA Lbr Len/2nd Weight Sex Delivery Anes PTL Lv  4 Current           3 Preterm 12/24/13 6132w1d  2 lb 14.9 oz (1.33 kg) F CS-LTranv Spinal  ND  2 Term 04/10/08   5 lb 14 oz (2.665 kg) F Vag-Spont None N LIV     Birth Comments: "delivery 2 weeks early at 8 months"  1 SAB               Past Medical History:  Diagnosis Date  . Anxiety   . Bipolar disorder (HCC)   . Crack  cocaine use   . Depression   . 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 Infection   . Otitis media of left ear 03/15/2013  . Schizophrenia (HCC)   . 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 Tardive dyskinesia 06/13/2014    Past Surgical History:  Procedure Laterality Date  . CESAREAN SECTION N/A 12/24/2013   Procedure: CESAREAN SECTION;  Surgeon: Lesly DukesKelly H Leggett, MD;  Location: [facility];  Service: Obstetrics;  Laterality: N/A;  . COLPOSCOPY  2009  . DENTAL RESTORATION/EXTRACTION WITH X-RAY      Social History   Social History  . Marital status: Single    Spouse name: N/A  . Number of children: 1  . Years of education: 11th   Social History Main Topics  . Smoking status: Current Every Day Smoker    Packs/day: 0.10    Years: 3.00    Types: Cigarettes  . Smokeless tobacco: Never Used     Comment: cutting back, 1 cigs/day  . Alcohol use No  . Drug use:     Types: Cocaine, Marijuana     Comment: pt states she used this morning  . Sexual activity: Yes    Partners: Male    Birth control/ protection: None  Comment: declined condoms   Other Topics Concern  . Not on file   Social History Narrative   Patient lives at home with her partner Valda LambRobert Mason.   Unemployed.   Education 11 th grade.   Right handed.   Patient does not drink caffeine.    Family History  Problem Relation Age of Onset  . Diabetes Mother   . High blood pressure Mother   . Cancer Father    In addition, the patient has no family history of mental retardation, birth defects, or genetic diseases.  Physical Examination There were no vitals filed for this visit. General appearance - alert, well appearing, and in no distress Mental status - alert, oriented to person, place, and time  Assessment and Recommendations 1.  Drug abuse.  As Tamara Briggs is actively enrolled in a drug rehabilitation program, I have little to add to the management of this issue.  She should obviously stop using marijuana and tobacco,  but abstaining from cocaine use is the more important issue to consider at this time.  I defer management of these issues to her addiction medication team. 2.  HIV.  Tamara Briggs is on an appropriate anti-retroviral regimen.  She is following with her ID specialists and they plan on increasing her medication dose as pregnancy progresses due to the normal increases in blood volume that occur.  If her viral load is less than 1000 in the third trimester she can safely deliver vaginally as the risk of mother to baby transmission would be very low. 3.  Schizophrenia.  Tamara Briggs reports her mood and thought processes are normal and under control.  Although Kasandra KnudsenLatuda is a new medication and thus its safety in pregnancy is not clear, I think it is reasonable to continue as she is now out of the first trimester and her history suggests she requires medication to effectively manage the schizophrenia.  I encouraged her to [facility]. 4.  History of PPROM.  Tamara Briggs PPROMed in her last pregnancy and delivered via LTCS at 29 weeks.  Her child did not survive.  It would be reasonable to start Ms. Abdou on 17-OH progesterone; however, its efficacy would be uncertain in this case as it is being started very late in pregnancy. 5.  Pelvic Pain.  Tamara Briggs incidentally reported that she was having moderate and at time severe pelvic pain and pressure.  As her transvaginal cervical length is 3.0, so this is not due to preterm labor, but rather represents the normal musculoskeletal discomforts that are part of a normal pregnancy.  I spent 30 minutes with Ms. Brodowski today of which 50% was face-to-face counseling.  Thank you [facility].  Please do not hesitate to contact us with questions.   Rema FendtNITSCHE,Aliou Mealey, MD

## 2016-08-14 NOTE — ED Notes (Signed)
Pt here today in [place] for appt.  Pt reports unprompted Cocaine use two weeks ago but none since.  Pt states she has been in Rehab since 06/2016.  Still uses marijuana daily but is also trying to cut back on marijuana and cig smoking.  Pt denies any ETOH use.  Pt states her finance (not the FOB) is supportive and goes to rehab classes with her where she met him.

## 2016-08-17 LAB — MATERNIT GENOME: PDF: 0

## 2016-08-18 ENCOUNTER — Other Ambulatory Visit: Payer: Self-pay | Admitting: Certified Nurse Midwife

## 2016-08-18 DIAGNOSIS — O0992 Supervision of high risk pregnancy, unspecified, second trimester: Secondary | ICD-10-CM

## 2016-08-26 ENCOUNTER — Telehealth: Payer: Self-pay | Admitting: *Deleted

## 2016-08-26 NOTE — Telephone Encounter (Signed)
Patient ID: Tamara Briggs, female   DOB: 10/05/1985, 30 y.o.   MRN: [id number]  Order received on 07/27/2016 by Dr Cynthia Snider to evaluate patient for [facility] ([facility]).  Patient was evaluated on 07/29/2016 with initial contact made on 07/27/16. Patient was consented to care at this time.   Frequency / Duration of CBHCN visits, Effective 07/29/2016:  2mo2, 1mo1  4PRN's for complications with disease process/progression, medication changes or concerns   [facility]N will assess for learning needs related to diagnosis and treatment regimen, provide education as needed, fill pill box if needed, address any barriers which may be preventing medication compliance, and communicating with care team including physician and case manager.   Individualized Plan Of Care Certification Period of 07/29/2016 to 10/25/2016  a. Type of service(s) and care to be delivered: RN Case Management  b. Frequency and duration of service: Effective 07/29/2016: 2mo2, 1mo1 4 prns for complications with disease process/progression, barriers to care,  medication changes or concerns   c. Activity restrictions: Pt may be up as tolerated and can safely ambulate without the need for a assistive device   d. Safety Measures: Standard Precautions/Infection Control   e. Service Objectives and Goals: Service Objectives are to assist the pt with HIV medication regimen adherence and staying in care with the Infectious Disease Clinic by identifying barriers to care. RN will address the barriers that are identified by the patient.     Pt's current history of substance abuse and documented Mental Health creates a barrier for the patient. Tamara Briggs is currently in a substance abuse program that provides housing.  Patient Centered Goal is to provide Physical support and  medical guidance to the patient and her unborn baby. RN will coordinate with other community resources including Mental Health for the patient to receive  mental health services so she can began to meet her goal of HIV medication  compliance.  f. Equipment required: No additional equipment needs at this time   g. Functional Limitations: Vision   h. Rehabilitation potential: Guarded   i. Diet and Nutritional Needs: Regular Diet   j. Medications and treatments: Medications have been reconciled and reviewed and are a part of EPIC electronic file   k. Specific therapies if needed: RN   l. Pertinent diagnoses: HIV disease,  Hx of medication NonCompliance, High Risk pregnancy  m. Expected outcome: Guarded     Tamara Briggs states she has plans of keeping her baby, staying in care on her HIV medications and staying clean from crack cocaine use. Tamara Briggs stated she does not have winter clothing and with her belly getting bigger she needs some more clothes and only has summer clothing.  RN reviewed Agency Services, [place] Notice of Privacy Policy, Home Safety Management Information Booklet. Home Fire Safety Assessment, Fall Risk Assessment and Suicide Risk Assessment was performed. RN also discussed information on a Living Will, Advanced Directives, and Health Care Power of Attorney. RN and Client/Designated Party educated/reviewed/signed Client Agreement and Consent for Service form along with Patient Rights and Responsibilities statement. RN developed patient specific and centered care plan. RN provided contact information and reviewed how to receive emergency help after hours for schedule changes, reporting of safety issues, falls, concerns or any needs/questions. Standard Precaution and Infection control along with interventions to correct or prevent high risk behaviors instructed to the patient. Client/Caregiver reports understanding and agreement with the above      

## 2016-08-31 ENCOUNTER — Other Ambulatory Visit: Payer: Medicaid Other

## 2016-08-31 ENCOUNTER — Other Ambulatory Visit: Payer: Self-pay | Admitting: Internal Medicine

## 2016-08-31 ENCOUNTER — Ambulatory Visit (INDEPENDENT_AMBULATORY_CARE_PROVIDER_SITE_OTHER): Payer: Medicaid Other | Admitting: Obstetrics and Gynecology

## 2016-08-31 VITALS — BP 88/61 | HR 98 | Temp 97.7°F | Wt 98.0 lb

## 2016-08-31 DIAGNOSIS — O0930 Supervision of pregnancy with insufficient antenatal care, unspecified trimester: Secondary | ICD-10-CM

## 2016-08-31 DIAGNOSIS — O34219 Maternal care for unspecified type scar from previous cesarean delivery: Secondary | ICD-10-CM

## 2016-08-31 DIAGNOSIS — O09893 Supervision of other high risk pregnancies, third trimester: Secondary | ICD-10-CM

## 2016-08-31 DIAGNOSIS — Z98891 History of uterine scar from previous surgery: Secondary | ICD-10-CM | POA: Insufficient documentation

## 2016-08-31 DIAGNOSIS — Z21 Asymptomatic human immunodeficiency virus [HIV] infection status: Secondary | ICD-10-CM

## 2016-08-31 DIAGNOSIS — B20 Human immunodeficiency virus [HIV] disease: Secondary | ICD-10-CM

## 2016-08-31 DIAGNOSIS — O0933 Supervision of pregnancy with insufficient antenatal care, third trimester: Secondary | ICD-10-CM

## 2016-08-31 DIAGNOSIS — O98713 Human immunodeficiency virus [HIV] disease complicating pregnancy, third trimester: Secondary | ICD-10-CM

## 2016-08-31 DIAGNOSIS — O99820 Streptococcus B carrier state complicating pregnancy: Secondary | ICD-10-CM

## 2016-08-31 DIAGNOSIS — O0993 Supervision of high risk pregnancy, unspecified, third trimester: Secondary | ICD-10-CM

## 2016-08-31 MED ORDER — TETANUS-DIPHTH-ACELL PERTUSSIS 5-2.5-18.5 LF-MCG/0.5 IM SUSP
0.5000 mL | Freq: Once | INTRAMUSCULAR | Status: AC
  Administered 2016-08-31: 0.5 mL via INTRAMUSCULAR

## 2016-08-31 NOTE — Progress Notes (Signed)
   PRENATAL VISIT NOTE  Subjective:  Tamara Briggs is a 31 y.o. [id number] at 324w0d being seen today for ongoing prenatal care.  She is currently monitored for the following issues for this high-risk pregnancy and has HIV (human immunodeficiency virus infection) (HCC); Tobacco abuse; h/o crack Cocaine abuse; HIV risk factors complicating pregnancy; Schizophrenia (HCC); History of neural tube defect; Preterm premature rupture of membranes (PPROM) delivered, current hospitalization; Secondary Parkinson disease (HCC); Tardive dyskinesia; Supervision of high-risk pregnancy; Late prenatal care; GBS (group B Streptococcus carrier), +RV culture, currently pregnant; and Previous cesarean section complicating pregnancy on her problem list.  Patient reports URI symptoms without fever and non productive cough.  Contractions: Not present. Vag. Bleeding: None.  Movement: Present. Denies leaking of fluid.   The following portions of the patient's history were reviewed and updated as appropriate: allergies, current medications, past family history, past medical history, past social history, past surgical history and problem list. Problem list updated.  Objective:   Vitals:   08/31/16 0809  BP: (!) 88/61  Pulse: 98  Temp: 97.7 F (36.5 C)  Weight: 98 lb (44.5 kg)    Fetal Status: Fetal Heart Rate (bpm): 141 Fundal Height: 27 cm Movement: Present     General:  Alert, oriented and cooperative. Patient is in no acute distress.  Skin: Skin is warm and dry. No rash noted.   Cardiovascular: Normal heart rate noted  Respiratory: Normal respiratory effort, no problems with respiration noted  Abdomen: Soft, gravid, appropriate for gestational age. Pain/Pressure: Present     Pelvic:  Cervical exam deferred        Extremities: Normal range of motion.  Edema: None  Mental Status: Normal mood and affect. Normal behavior. Normal judgment and thought content.   Assessment and Plan:  Pregnancy: [id number] at 314w0d  1.  GBS (group B Streptococcus carrier), +RV culture, currently pregnant Will provide prophylaxis in labor  2. Supervision of high risk pregnancy in third trimester Reviewed OTC medications to help with symptom relief Third trimester labs today Follow up growth ultrasound already scheduled Patient desires TDap today - CBC - RPR - Glucose Tolerance, 2 Hours w/1 Hour  3. Late prenatal care Onset of care at 28 weeks  4. HIV risk factors affecting pregnancy in third trimester Continue medications Follow up ID appointment on 1/18  5. Previous cesarean section complicating pregnancy Discussed risks and benefits of RCS vs TOLAC. Form given for review Patient will confirm decision at next visit and sign consent. She is inclining towards a TOLAC  6. HIV (human immunodeficiency virus infection) (HCC)   Preterm labor symptoms and general obstetric precautions including but not limited to vaginal bleeding, contractions, leaking of fluid and fetal movement were reviewed in detail with the patient. Please refer to After Visit Summary for other counseling recommendations.  No Follow-up on file.   Catalina AntiguaPeggy Yann Biehn, MD

## 2016-08-31 NOTE — Progress Notes (Signed)
Agree with the plan of care for Tamara Briggs. She is a70yo F with poorly controlled hiv disease, re-using illicit drugs, in her 2nd trimester of pregnancy. Attempting to get her engaged back into care ------------------------------------------------  Order received on 07/27/2016 by Dr Judyann Munson to evaluate patient for [facility]).  Patient was evaluated on 07/29/2016 with initial contact made on 07/27/16. Patient was consented to care at this time.   Frequency / Duration of CBHCN visits, Effective 07/29/2016:  48mo2, 25mo1  4PRN's for complications with disease process/progression, medication changes or concerns   CBHCN will assess for learning needs related to diagnosis and treatment regimen, provide education as needed, fill pill box if needed, address any barriers which may be preventing medication compliance, and communicating with care team including physician and case manager.   Individualized Plan Of Care Certification Period of 07/29/2016 to 10/25/2016             a. Type of service(s) and care to be delivered: RN Case Management             b. Frequency and duration of service: Effective 07/29/2016: 12mo2, 68mo1 4 prns for complications with disease process/progression, barriers to care,  medication changes or concerns              c. Activity restrictions: Pt may be up as tolerated and can safely ambulate without the need for a assistive device              d. Safety Measures: Standard Precautions/Infection Control              e. Service Objectives and Goals: Service Objectives are to assist the pt with HIV medication regimen adherence and staying in care with the Infectious Disease Clinic by identifying barriers to care. RN will address the barriers that are identified by the patient.               Pt's current history of substance abuse and documented Mental Health creates a barrier for the patient. Tamara Briggs is currently in a substance abuse  program that provides housing.  Patient Centered Goal is to provide Physical support and  medical guidance to the patient and her unborn baby. RN will coordinate with other community resources including Mental Health for the patient to receive mental health services so she can began to meet her goal of HIV medication       compliance.             f. Equipment required: No additional equipment needs at this time              g. Functional Limitations: Vision              h. Rehabilitation potential: Guarded              i. Diet and Nutritional Needs: Regular Diet              j. Medications and treatments: Medications have been reconciled and reviewed and are a part of EPIC electronic file              k. Specific therapies if needed: RN              l. Pertinent diagnoses: HIV disease,  Hx of medication NonCompliance, High Risk pregnancy             m. Expected outcome: Guarded     Tamara Briggs states she has plans of keeping her  baby, staying in care on her HIV medications and staying clean from crack cocaine use. Tamara Briggs stated she does not have winter clothing and with her belly getting bigger she needs some more clothes and only has summer clothing.  RN reviewed Engineer, building servicesAgency Services, [place] Notice of Arboriculturistrivacy Policy, Home Safety Management Information Booklet. Home Fire Safety Assessment, Fall Risk Assessment and Suicide Risk Assessment was performed. RN also discussed information on a Living Will, Advanced Directives, and Health Care Power of [place]. RN and Client/Designated Party educated/reviewed/signed Client Agreement and Consent for Service form along with Patient Rights and Responsibilities statement. RN developed patient specific and centered care plan. RN provided contact information and reviewed how to receive emergency help after hours for schedule changes, reporting of safety issues, falls, concerns or any needs/questions. Standard Precaution and Infection control along with interventions  to correct or prevent high risk behaviors instructed to the patient. Client/Caregiver reports understanding and agreement with the above

## 2016-08-31 NOTE — Progress Notes (Signed)
Patient states that she has been sick for the last 3 days and has not been able to eat, reports good fetal movement.

## 2016-09-01 LAB — GLUCOSE TOLERANCE, 2 HOURS W/ 1HR
GLUCOSE, 1 HOUR: 117 mg/dL (ref 65–179)
GLUCOSE, 2 HOUR: 116 mg/dL (ref 65–152)
Glucose, Fasting: 81 mg/dL (ref 65–91)

## 2016-09-01 LAB — CBC
HEMOGLOBIN: 10.8 g/dL — AB (ref 11.1–15.9)
Hematocrit: 30.6 % — ABNORMAL LOW (ref 34.0–46.6)
MCH: 30.8 pg (ref 26.6–33.0)
MCHC: 35.3 g/dL (ref 31.5–35.7)
MCV: 87 fL (ref 79–97)
PLATELETS: 138 10*3/uL — AB (ref 150–379)
RBC: 3.51 x10E6/uL — AB (ref 3.77–5.28)
RDW: 12.9 % (ref 12.3–15.4)
WBC: 2.8 10*3/uL — ABNORMAL LOW (ref 3.4–10.8)

## 2016-09-01 LAB — RPR: RPR Ser Ql: NONREACTIVE

## 2016-09-10 ENCOUNTER — Ambulatory Visit: Payer: Medicaid Other | Admitting: Internal Medicine

## 2016-09-11 ENCOUNTER — Ambulatory Visit (HOSPITAL_COMMUNITY): Payer: Medicaid Other

## 2016-09-14 ENCOUNTER — Encounter: Payer: Medicaid Other | Admitting: Obstetrics and Gynecology

## 2016-09-17 ENCOUNTER — Ambulatory Visit (HOSPITAL_COMMUNITY)
Admission: RE | Admit: 2016-09-17 | Discharge: 2016-09-17 | Disposition: A | Discharge status: Home / Self Care | Payer: Medicaid Other | Source: Ambulatory Visit | Attending: Certified Nurse Midwife | Admitting: Certified Nurse Midwife

## 2016-09-17 ENCOUNTER — Other Ambulatory Visit: Payer: Self-pay | Admitting: *Deleted

## 2016-09-17 DIAGNOSIS — O34219 Maternal care for unspecified type scar from previous cesarean delivery: Secondary | ICD-10-CM

## 2016-09-17 DIAGNOSIS — O09893 Supervision of other high risk pregnancies, third trimester: Secondary | ICD-10-CM

## 2016-09-17 MED ORDER — ENSURE PO LIQD: 237.0000 mL | Freq: Two times a day (BID) | ORAL | 11 refills | Status: DC

## 2016-09-25 ENCOUNTER — Other Ambulatory Visit (HOSPITAL_COMMUNITY): Payer: Self-pay | Admitting: Maternal and Fetal Medicine

## 2016-09-25 ENCOUNTER — Encounter: Payer: Self-pay | Admitting: *Deleted

## 2016-09-25 ENCOUNTER — Encounter (HOSPITAL_COMMUNITY): Payer: Self-pay

## 2016-09-25 ENCOUNTER — Ambulatory Visit (HOSPITAL_COMMUNITY)
Admission: RE | Admit: 2016-09-25 | Discharge: 2016-09-25 | Disposition: A | Discharge status: Home / Self Care | Payer: Medicaid Other | Source: Ambulatory Visit | Attending: Certified Nurse Midwife | Admitting: Certified Nurse Midwife

## 2016-09-25 DIAGNOSIS — O99320 Drug use complicating pregnancy, unspecified trimester: Secondary | ICD-10-CM

## 2016-09-25 DIAGNOSIS — O34219 Maternal care for unspecified type scar from previous cesarean delivery: Secondary | ICD-10-CM

## 2016-09-25 DIAGNOSIS — Z362 Encounter for other antenatal screening follow-up: Secondary | ICD-10-CM | POA: Insufficient documentation

## 2016-09-25 DIAGNOSIS — O99323 Drug use complicating pregnancy, third trimester: Secondary | ICD-10-CM | POA: Diagnosis not present

## 2016-09-25 DIAGNOSIS — O98713 Human immunodeficiency virus [HIV] disease complicating pregnancy, third trimester: Secondary | ICD-10-CM

## 2016-09-25 DIAGNOSIS — Z3A31 31 weeks gestation of pregnancy: Secondary | ICD-10-CM | POA: Insufficient documentation

## 2016-09-25 DIAGNOSIS — O09893 Supervision of other high risk pregnancies, third trimester: Secondary | ICD-10-CM

## 2016-09-25 DIAGNOSIS — F1910 Other psychoactive substance abuse, uncomplicated: Secondary | ICD-10-CM

## 2016-09-25 DIAGNOSIS — O99343 Other mental disorders complicating pregnancy, third trimester: Secondary | ICD-10-CM | POA: Diagnosis not present

## 2016-09-25 DIAGNOSIS — O34211 Maternal care for low transverse scar from previous cesarean delivery: Secondary | ICD-10-CM | POA: Diagnosis not present

## 2016-09-25 DIAGNOSIS — B20 Human immunodeficiency virus [HIV] disease: Secondary | ICD-10-CM

## 2016-10-07 ENCOUNTER — Encounter: Payer: Medicaid Other | Admitting: Obstetrics and Gynecology

## 2016-10-19 ENCOUNTER — Ambulatory Visit (HOSPITAL_COMMUNITY)
Admission: RE | Admit: 2016-10-19 | Discharge: 2016-10-19 | Disposition: A | Discharge status: Home / Self Care | Payer: Medicaid Other | Source: Ambulatory Visit | Attending: Certified Nurse Midwife | Admitting: Certified Nurse Midwife

## 2016-10-20 ENCOUNTER — Encounter: Payer: Medicaid Other | Admitting: Obstetrics and Gynecology

## 2016-10-22 ENCOUNTER — Ambulatory Visit (HOSPITAL_COMMUNITY)
Admission: RE | Admit: 2016-10-22 | Discharge: 2016-10-22 | Disposition: A | Discharge status: Home / Self Care | Payer: Medicaid Other | Source: Ambulatory Visit | Attending: Certified Nurse Midwife | Admitting: Certified Nurse Midwife

## 2016-10-22 ENCOUNTER — Ambulatory Visit: Payer: Medicaid Other | Admitting: Internal Medicine

## 2016-10-22 ENCOUNTER — Inpatient Hospital Stay (HOSPITAL_COMMUNITY)
Admission: AD | Admit: 2016-10-22 | Discharge: 2016-10-25 | DRG: 774 | Disposition: A | Discharge status: Home / Self Care | Payer: Medicaid Other | Source: Ambulatory Visit | Attending: Obstetrics & Gynecology | Admitting: Obstetrics & Gynecology

## 2016-10-22 ENCOUNTER — Encounter (HOSPITAL_COMMUNITY): Payer: Self-pay

## 2016-10-22 ENCOUNTER — Encounter (HOSPITAL_COMMUNITY): Payer: Self-pay | Admitting: *Deleted

## 2016-10-22 DIAGNOSIS — F1490 Cocaine use, unspecified, uncomplicated: Secondary | ICD-10-CM | POA: Diagnosis present

## 2016-10-22 DIAGNOSIS — O99334 Smoking (tobacco) complicating childbirth: Secondary | ICD-10-CM | POA: Diagnosis present

## 2016-10-22 DIAGNOSIS — O99344 Other mental disorders complicating childbirth: Secondary | ICD-10-CM | POA: Diagnosis present

## 2016-10-22 DIAGNOSIS — F17210 Nicotine dependence, cigarettes, uncomplicated: Secondary | ICD-10-CM | POA: Diagnosis present

## 2016-10-22 DIAGNOSIS — O34219 Maternal care for unspecified type scar from previous cesarean delivery: Secondary | ICD-10-CM

## 2016-10-22 DIAGNOSIS — O9872 Human immunodeficiency virus [HIV] disease complicating childbirth: Secondary | ICD-10-CM | POA: Diagnosis present

## 2016-10-22 DIAGNOSIS — F1410 Cocaine abuse, uncomplicated: Secondary | ICD-10-CM | POA: Diagnosis not present

## 2016-10-22 DIAGNOSIS — Z3A35 35 weeks gestation of pregnancy: Secondary | ICD-10-CM | POA: Diagnosis not present

## 2016-10-22 DIAGNOSIS — F209 Schizophrenia, unspecified: Secondary | ICD-10-CM | POA: Diagnosis present

## 2016-10-22 DIAGNOSIS — O99824 Streptococcus B carrier state complicating childbirth: Secondary | ICD-10-CM | POA: Diagnosis present

## 2016-10-22 DIAGNOSIS — Z880 Allergy status to penicillin: Secondary | ICD-10-CM

## 2016-10-22 DIAGNOSIS — O09893 Supervision of other high risk pregnancies, third trimester: Secondary | ICD-10-CM

## 2016-10-22 DIAGNOSIS — F319 Bipolar disorder, unspecified: Secondary | ICD-10-CM | POA: Diagnosis not present

## 2016-10-22 DIAGNOSIS — Z833 Family history of diabetes mellitus: Secondary | ICD-10-CM | POA: Diagnosis not present

## 2016-10-22 DIAGNOSIS — B20 Human immunodeficiency virus [HIV] disease: Secondary | ICD-10-CM | POA: Diagnosis not present

## 2016-10-22 DIAGNOSIS — Z21 Asymptomatic human immunodeficiency virus [HIV] infection status: Secondary | ICD-10-CM | POA: Diagnosis present

## 2016-10-22 DIAGNOSIS — F1210 Cannabis abuse, uncomplicated: Secondary | ICD-10-CM | POA: Diagnosis not present

## 2016-10-22 DIAGNOSIS — O99324 Drug use complicating childbirth: Secondary | ICD-10-CM | POA: Diagnosis present

## 2016-10-22 LAB — CBC
HEMATOCRIT: 32.5 % — AB (ref 36.0–46.0)
Hemoglobin: 11.3 g/dL — ABNORMAL LOW (ref 12.0–15.0)
MCH: 29.4 pg (ref 26.0–34.0)
MCHC: 34.8 g/dL (ref 30.0–36.0)
MCV: 84.6 fL (ref 78.0–100.0)
Platelets: 139 10*3/uL — ABNORMAL LOW (ref 150–400)
RBC: 3.84 MIL/uL — ABNORMAL LOW (ref 3.87–5.11)
RDW: 14 % (ref 11.5–15.5)
WBC: 9.2 10*3/uL (ref 4.0–10.5)

## 2016-10-22 MED ORDER — OXYCODONE-ACETAMINOPHEN 5-325 MG PO TABS: 2.0000 | ORAL_TABLET | ORAL | Status: DC | PRN

## 2016-10-22 MED ORDER — ONDANSETRON HCL 4 MG/2ML IJ SOLN: 4.0000 mg | Freq: Four times a day (QID) | INTRAMUSCULAR | Status: DC | PRN

## 2016-10-22 MED ORDER — SOD CITRATE-CITRIC ACID 500-334 MG/5ML PO SOLN
30.0000 mL | ORAL | Status: DC | PRN
Start: 2016-10-22 — End: 2016-10-23

## 2016-10-22 MED ORDER — OXYTOCIN 40 UNITS IN LACTATED RINGERS INFUSION - SIMPLE MED
INTRAVENOUS | Status: AC
  Administered 2016-10-23: 500 mL via INTRAVENOUS
  Filled 2016-10-22: qty 1000

## 2016-10-22 MED ORDER — LIDOCAINE HCL (PF) 1 % IJ SOLN
INTRAMUSCULAR | Status: AC
  Filled 2016-10-22: qty 30

## 2016-10-22 MED ORDER — ACETAMINOPHEN 325 MG PO TABS: 650.0000 mg | ORAL_TABLET | ORAL | Status: DC | PRN

## 2016-10-22 MED ORDER — OXYTOCIN 40 UNITS IN LACTATED RINGERS INFUSION - SIMPLE MED: 2.5000 [IU]/h | INTRAVENOUS | Status: DC

## 2016-10-22 MED ORDER — FENTANYL CITRATE (PF) 100 MCG/2ML IJ SOLN: 50.0000 ug | INTRAMUSCULAR | Status: DC | PRN

## 2016-10-22 MED ORDER — ZIDOVUDINE 10 MG/ML IV SOLN
1.0000 mg/kg/h | INTRAVENOUS | Status: DC
  Administered 2016-10-22: 1 mg/kg/h via INTRAVENOUS
  Filled 2016-10-22: qty 40

## 2016-10-22 MED ORDER — LACTATED RINGERS IV SOLN: 500.0000 mL | INTRAVENOUS | Status: DC | PRN

## 2016-10-22 MED ORDER — OXYCODONE-ACETAMINOPHEN 5-325 MG PO TABS: 1.0000 | ORAL_TABLET | ORAL | Status: DC | PRN

## 2016-10-22 MED ORDER — LACTATED RINGERS IV SOLN
INTRAVENOUS | Status: DC
  Administered 2016-10-22: 22:00:00 via INTRAVENOUS

## 2016-10-22 MED ORDER — LIDOCAINE HCL (PF) 1 % IJ SOLN
30.0000 mL | INTRAMUSCULAR | Status: DC | PRN
  Filled 2016-10-22: qty 30

## 2016-10-22 MED ORDER — SODIUM CHLORIDE 0.9 % IV SOLN
2.0000 g | Freq: Four times a day (QID) | INTRAVENOUS | Status: DC
  Administered 2016-10-22: 2 g via INTRAVENOUS
  Filled 2016-10-22 (×3): qty 2000

## 2016-10-22 MED ORDER — FLEET ENEMA 7-19 GM/118ML RE ENEM: 1.0000 | ENEMA | RECTAL | Status: DC | PRN

## 2016-10-22 MED ORDER — ZIDOVUDINE 10 MG/ML IV SOLN
2.0000 mg/kg | Freq: Once | INTRAVENOUS | Status: AC
  Administered 2016-10-22: 91 mg via INTRAVENOUS
  Filled 2016-10-22: qty 9.1

## 2016-10-22 MED ORDER — OXYTOCIN BOLUS FROM INFUSION
500.0000 mL | Freq: Once | INTRAVENOUS | Status: DC
  Administered 2016-10-23: 500 mL via INTRAVENOUS

## 2016-10-22 NOTE — H&P (Signed)
Tamara CoolMaysha Briggs is a 31 y.o. female [phone] with IUP at 7553w3d presenting for Contractions that [facility] [employer]. Pt unwilling to answer questions.  Brought in by EMS Membranes are intact, with active fetal movement.   [facility] since 24 wks. Stated that she has not been taking her meds since she found out she was pregnant, but notes state that she was taking them as recently as January.   Prenatal History/Complications:  29 week CS for fetal stress, neonatal demise + cocaine antenatally Schizophrenia-no recent labs (last results 07/08/16:  HIV-1 RNA 5552/HIV-1RNA log: 3.74, CD4 Helper T cell 26% [facility]   Past Medical History: Past Medical History:  Diagnosis Date  . Anxiety   . Bipolar disorder (HCC)   . Crack cocaine use   . Depression   . 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 Infection   . Otitis media of left ear 03/15/2013  . Schizophrenia (HCC)   . 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 Tardive dyskinesia 06/13/2014    Past Surgical History: Past Surgical History:  Procedure Laterality Date  . CESAREAN SECTION N/A 12/24/2013   Procedure: CESAREAN SECTION;  Surgeon: Lesly DukesKelly H Leggett, MD;  Location: [facility];  Service: Obstetrics;  Laterality: N/A;  . COLPOSCOPY  2009  . DENTAL RESTORATION/EXTRACTION WITH X-RAY      Obstetrical History: OB History    Gravida Para Term Preterm AB Living   4 2 1 1 1 1    SAB TAB Ectopic Multiple Live Births   1 0 0 0 2       Social History: Social History   Social History  . Marital status: Single    Spouse name: N/A  . Number of children: 1  . Years of education: 11th   Social History Main Topics  . Smoking status: Current Every Day Smoker    Packs/day: 0.10    Years: 3.00    Types: Cigarettes  . Smokeless tobacco: Never Used     Comment: cutting back, 1 cigs/day  . Alcohol use No  . Drug use: Yes    Types: Cocaine, Marijuana     Comment: pt states she used this morning  . Sexual activity: Yes   Partners: Male    Birth control/ protection: None     Comment: declined condoms   Other Topics Concern  . None   Social History Narrative   Patient lives at home with her partner Valda LambRobert Mason.   Unemployed.   Education 11 th grade.   Right handed.   Patient does not drink caffeine.    Family History: Family History  Problem Relation Age of Onset  . Diabetes Mother   . High blood pressure Mother   . Cancer Father     Allergies: No Known Allergies  Prescriptions Prior to Admission  Medication Sig Dispense Refill Last Dose  . atazanavir-cobicistat (EVOTAZ) 300-150 MG tablet Take 1 tablet by mouth daily. Swallow whole. Do NOT crush, cut or chew tablet. Take with food. 30 tablet 11 Taking  . benztropine (COGENTIN) 0.5 MG tablet Take 1 tablet (0.5 mg total) by mouth 2 (two) times daily. (Patient not taking: Reported on 08/14/2016) 60 tablet 11 Not Taking  . emtricitabine-tenofovir (TRUVADA) 200-300 MG tablet Take 1 tablet by mouth daily. 30 tablet 11 Taking  . ENSURE (ENSURE) Take 237 mLs by mouth 2 (two) times daily between meals. (Patient not taking: Reported on 09/25/2016) 237 mL 11 Not Taking  . LATUDA 40 MG  TABS tablet Take 40 mg by mouth daily with breakfast.   2 Taking  . metroNIDAZOLE (FLAGYL) 500 MG tablet Take 1 tablet (500 mg total) by mouth 2 (two) times daily. (Patient not taking: Reported on 08/14/2016) 14 tablet 0 Not Taking  . ondansetron (ZOFRAN-ODT) 8 MG disintegrating tablet Take 1 tablet (8 mg total) by mouth every 8 (eight) hours as needed for nausea or vomiting. (Patient not taking: Reported on 08/14/2016) 20 tablet 6 Not Taking  . Prenat-FeCbn-FeAspGl-FA-Omega (OB COMPLETE PETITE) 35-5-1-200 MG CAPS Take 1 tablet by mouth daily. 30 capsule 12 Taking  . sertraline (ZOLOFT) 50 MG tablet Take 1 tablet (50 mg total) by mouth daily. 30 tablet 5 Taking  . terconazole (TERAZOL 3) 0.8 % vaginal cream Place 1 applicator vaginally at bedtime. (Patient not taking: Reported on  09/25/2016) 20 g 0 Not Taking     .[facility] Prenatal Labs  Dating U/S on 06-30-16 Blood type:   o pos  Genetic Screen 1 Screen:    AFP:     Quad:     NIPS:genome: normal Antibody: neg  Anatomic [place] Normal @25  wks; female fetus; f/u growth & anatomy scheduled Rubella: 2.74 (11/07 0729)  GTT  Third trimester: nl 2 hr RPR: Non Reactive (11/07 0729)   Flu vaccine Declined HBsAg: Negative (11/07 0729)   TDaP vaccine   08/31/2016                                            Rhogam: HIV: Comment (11/07 0729) Positive  Baby Food    Breast and bottle                                          GBS: (For PCN allergy, check sensitivities) Positive Urine  Contraception nothing Pap: negative (08/06/2016)  Circumcision n/a   Pediatrician Info given   Support Person FOB       Review of Systems   Unable to elicit d/t uncooperativeness     Temperature 97.8 F (36.6 C), temperature source Oral, weight 45.4 kg (100 lb 1.6 oz). General appearance: combative, distracted, fatigued, no distress, slowed mentation and uncooperative Lungs: clear to auscultation bilaterally Heart: regular rate and rhythm Abdomen: soft, non-tender; bowel sounds normal Extremities: Homans sign is negative, no sign of DVT DTR's 2+ Presentation: cephalic Fetal monitoring  Baseline: 140 bpm, Variability: Good {> 6 bpm), Accelerations: 10x10 accels and Decelerations: Absent Uterine activity  q 2-4 minutes     Prenatal labs: ABO, Rh: O/Positive/-- (12/14 1519) Antibody: Negative (12/14 1519) Rubella: !Error!immune RPR: Non Reactive (01/08 1020)  HBsAg: Negative (12/14 1519)  HIV: Comment (11/07 0729) +     No results found for this or any previous visit (from the past 24 hour(s)).  Assessment: Tamara Briggs is a 31 y.o. [id number] with an IUP at [redacted]w[redacted]d presenting for imminent delivery  Plan: #Labor: expectant management #Pain:  Per request #FWB Cat 1 #ID: GBS: try to get ampicillin, doubtful.  Try to get AZT,  doubtful     CRESENZO-DISHMAN,Teigan Manner 10/22/2016, 10:44 PM

## 2016-10-23 ENCOUNTER — Telehealth: Payer: Self-pay | Admitting: *Deleted

## 2016-10-23 ENCOUNTER — Ambulatory Visit: Payer: Self-pay | Admitting: *Deleted

## 2016-10-23 ENCOUNTER — Encounter (HOSPITAL_COMMUNITY): Payer: Self-pay | Admitting: *Deleted

## 2016-10-23 DIAGNOSIS — F209 Schizophrenia, unspecified: Secondary | ICD-10-CM

## 2016-10-23 DIAGNOSIS — O99824 Streptococcus B carrier state complicating childbirth: Secondary | ICD-10-CM

## 2016-10-23 DIAGNOSIS — Z21 Asymptomatic human immunodeficiency virus [HIV] infection status: Secondary | ICD-10-CM

## 2016-10-23 DIAGNOSIS — O99344 Other mental disorders complicating childbirth: Secondary | ICD-10-CM

## 2016-10-23 DIAGNOSIS — B20 Human immunodeficiency virus [HIV] disease: Secondary | ICD-10-CM

## 2016-10-23 DIAGNOSIS — Z3A35 35 weeks gestation of pregnancy: Secondary | ICD-10-CM

## 2016-10-23 DIAGNOSIS — F319 Bipolar disorder, unspecified: Secondary | ICD-10-CM

## 2016-10-23 DIAGNOSIS — F1210 Cannabis abuse, uncomplicated: Secondary | ICD-10-CM

## 2016-10-23 DIAGNOSIS — F1410 Cocaine abuse, uncomplicated: Secondary | ICD-10-CM

## 2016-10-23 DIAGNOSIS — O9872 Human immunodeficiency virus [HIV] disease complicating childbirth: Secondary | ICD-10-CM

## 2016-10-23 LAB — RAPID URINE DRUG SCREEN, HOSP PERFORMED
AMPHETAMINES: NOT DETECTED
BENZODIAZEPINES: NOT DETECTED
Barbiturates: NOT DETECTED
COCAINE: POSITIVE — AB
Opiates: NOT DETECTED
Tetrahydrocannabinol: POSITIVE — AB

## 2016-10-23 LAB — TYPE AND SCREEN
ABO/RH(D): O POS
Antibody Screen: NEGATIVE

## 2016-10-23 LAB — RPR: RPR: NONREACTIVE

## 2016-10-23 MED ORDER — BENZOCAINE-MENTHOL 20-0.5 % EX AERO
1.0000 | INHALATION_SPRAY | CUTANEOUS | Status: DC | PRN
Start: 2016-10-23 — End: 2016-10-26

## 2016-10-23 MED ORDER — ATAZANAVIR-COBICISTAT 300-150 MG PO TABS: 1.0000 | ORAL_TABLET | Freq: Every day | ORAL | Status: DC

## 2016-10-23 MED ORDER — OXYCODONE HCL 5 MG PO TABS: 10.0000 mg | ORAL_TABLET | ORAL | Status: DC | PRN

## 2016-10-23 MED ORDER — METHYLERGONOVINE MALEATE 0.2 MG PO TABS: 0.2000 mg | ORAL_TABLET | ORAL | Status: DC | PRN

## 2016-10-23 MED ORDER — ACETAMINOPHEN 325 MG PO TABS
650.0000 mg | ORAL_TABLET | ORAL | Status: DC | PRN
  Administered 2016-10-23 – 2016-10-25 (×2): 650 mg via ORAL
  Filled 2016-10-23 (×2): qty 2

## 2016-10-23 MED ORDER — OXYTOCIN 40 UNITS IN LACTATED RINGERS INFUSION - SIMPLE MED: 2.5000 [IU]/h | INTRAVENOUS | Status: DC | PRN

## 2016-10-23 MED ORDER — SENNOSIDES-DOCUSATE SODIUM 8.6-50 MG PO TABS
2.0000 | ORAL_TABLET | ORAL | Status: DC
  Administered 2016-10-24: 2 via ORAL
  Filled 2016-10-23 (×2): qty 2

## 2016-10-23 MED ORDER — ATAZANAVIR-COBICISTAT 300-150 MG PO TABS
1.0000 | ORAL_TABLET | Freq: Every day | ORAL | Status: DC
  Administered 2016-10-23 – 2016-10-25 (×3): 1 via ORAL
  Filled 2016-10-23 (×4): qty 1

## 2016-10-23 MED ORDER — ZOLPIDEM TARTRATE 5 MG PO TABS: 5.0000 mg | ORAL_TABLET | Freq: Every evening | ORAL | Status: DC | PRN

## 2016-10-23 MED ORDER — PRENATAL MULTIVITAMIN CH
1.0000 | ORAL_TABLET | Freq: Every day | ORAL | Status: DC
  Filled 2016-10-23: qty 1

## 2016-10-23 MED ORDER — NON FORMULARY: 300.0000 mg | Freq: Every day | Status: DC

## 2016-10-23 MED ORDER — DIBUCAINE 1 % RE OINT: 1.0000 "application " | TOPICAL_OINTMENT | RECTAL | Status: DC | PRN

## 2016-10-23 MED ORDER — METHYLERGONOVINE MALEATE 0.2 MG/ML IJ SOLN: 0.2000 mg | INTRAMUSCULAR | Status: DC | PRN

## 2016-10-23 MED ORDER — COCONUT OIL OIL: 1.0000 "application " | TOPICAL_OIL | Status: DC | PRN

## 2016-10-23 MED ORDER — BENZTROPINE MESYLATE 0.5 MG PO TABS
0.5000 mg | ORAL_TABLET | Freq: Two times a day (BID) | ORAL | Status: DC
  Administered 2016-10-23 – 2016-10-25 (×6): 0.5 mg via ORAL
  Filled 2016-10-23 (×7): qty 1

## 2016-10-23 MED ORDER — TETANUS-DIPHTH-ACELL PERTUSSIS 5-2.5-18.5 LF-MCG/0.5 IM SUSP: 0.5000 mL | Freq: Once | INTRAMUSCULAR | Status: DC

## 2016-10-23 MED ORDER — EMTRICITABINE-TENOFOVIR AF 200-25 MG PO TABS: 1.0000 | ORAL_TABLET | Freq: Every day | ORAL | Status: DC

## 2016-10-23 MED ORDER — MEASLES, MUMPS & RUBELLA VAC ~~LOC~~ INJ
0.5000 mL | INJECTION | Freq: Once | SUBCUTANEOUS | Status: DC
  Filled 2016-10-23: qty 0.5

## 2016-10-23 MED ORDER — SIMETHICONE 80 MG PO CHEW: 80.0000 mg | CHEWABLE_TABLET | ORAL | Status: DC | PRN

## 2016-10-23 MED ORDER — ONDANSETRON HCL 4 MG PO TABS: 4.0000 mg | ORAL_TABLET | ORAL | Status: DC | PRN

## 2016-10-23 MED ORDER — DIPHENHYDRAMINE HCL 25 MG PO CAPS: 25.0000 mg | ORAL_CAPSULE | Freq: Four times a day (QID) | ORAL | Status: DC | PRN

## 2016-10-23 MED ORDER — EMTRICITABINE-TENOFOVIR DF 200-300 MG PO TABS
1.0000 | ORAL_TABLET | Freq: Every day | ORAL | Status: DC
  Administered 2016-10-23 – 2016-10-25 (×3): 1 via ORAL
  Filled 2016-10-23 (×4): qty 1

## 2016-10-23 MED ORDER — COMPLETENATE 29-1 MG PO CHEW
1.0000 | CHEWABLE_TABLET | Freq: Every day | ORAL | Status: DC
  Administered 2016-10-23 – 2016-10-25 (×3): 1 via ORAL
  Filled 2016-10-23 (×4): qty 1

## 2016-10-23 MED ORDER — OXYCODONE HCL 5 MG PO TABS: 5.0000 mg | ORAL_TABLET | ORAL | Status: DC | PRN

## 2016-10-23 MED ORDER — IBUPROFEN 600 MG PO TABS
600.0000 mg | ORAL_TABLET | Freq: Four times a day (QID) | ORAL | Status: DC
  Administered 2016-10-23 – 2016-10-25 (×9): 600 mg via ORAL
  Filled 2016-10-23 (×10): qty 1

## 2016-10-23 MED ORDER — WITCH HAZEL-GLYCERIN EX PADS: 1.0000 "application " | MEDICATED_PAD | CUTANEOUS | Status: DC | PRN

## 2016-10-23 MED ORDER — SERTRALINE HCL 50 MG PO TABS
50.0000 mg | ORAL_TABLET | Freq: Every day | ORAL | Status: DC
  Administered 2016-10-23 – 2016-10-25 (×3): 50 mg via ORAL
  Filled 2016-10-23 (×4): qty 1

## 2016-10-23 MED ORDER — ONDANSETRON HCL 4 MG/2ML IJ SOLN: 4.0000 mg | INTRAMUSCULAR | Status: DC | PRN

## 2016-10-23 MED ORDER — LURASIDONE HCL 40 MG PO TABS
40.0000 mg | ORAL_TABLET | Freq: Every day | ORAL | Status: DC
  Administered 2016-10-23 – 2016-10-25 (×3): 40 mg via ORAL
  Filled 2016-10-23 (×4): qty 1

## 2016-10-23 NOTE — Consult Note (Signed)
Neonatology Note:   Attendance at C-section:    I was asked by Dr. Rogers to attend this NSVD at 35 4/7 weeks after mother presented in labor tonight. The mother is a G4P2A1L1 O pos, HIV +, GBS positive with late PNC starting at 28 weeks. She is schizophrenic and has bipolar disorder; she was reportedly taking antiviral medications until about January, when she stopped taking them. Last labs done on 07/08/2016 showed the HIV-1 RNA 5552. She has a history of crack cocaine use and is a cigarette smoker (< 1/2 ppd). The mother was given 1 dose of Ampicillin and Zidovudine 1.5 hours before delivery. She is afebrile. ROM about 1 hour prior to delivery, fluid reported to have meconium present, but none seen by team. Infant vigorous with good spontaneous cry and tone. Delayed cord clamping was done. Needed no suctioning. Ap 9/10. Lungs clear to ausc in DR. Weight is 2055 grams. First void lost during cord clamping, but attempting to collect next void.   Utilizing the [facility] early onset sepsis calculator, the sepsis risk for this well-appearing infant is low and does not require special care. However, if his clinical condition changes, more evaluation may need to be done.  Due to high risk for HIV transmission, I have asked nursing to bathe him immediately after birth and prior to any needle sticks. Oral antivirals have been ordered. I spoke with Dr. Hartzell by phone and she feels comfortable caring for this infant on mother baby status for now.    Kolsen Choe C. Shakiya Mcneary, MD 

## 2016-10-23 NOTE — Progress Notes (Signed)
Delivery Note  First Stage: Labor onset: 0400 Augmentation : none Analgesia /Anesthesia intrapartum: none SROM at 0006  Second Stage: Complete dilation at 2245 Onset of pushing at 0007 FHR second stage Cat 1  Pt was C/C/+2 for over an hour.  All IV meds were in >1 hour.  Pt rolled over had had SROM w/light mec. NICU called to attend delivery.   Delivery of a viable female at 0008 over one push by SNM in LOA position no nuchal cord Cord double clamped after cessation of pulsation, cut by SNM Cord blood sample collected   Third Stage: Placenta delivered [facility] intact with 3 VC @ 0011 Placenta disposition: Pathology Uterine tone firm / bleeding moderate  No laceration identified  Est. Blood Loss (mL): 100  Complications: HIV +, Schizophrenia, + cocaine   Mom to postpartum.  Baby to Nursery.  Newborn: Apgar Scores: 9/9 Feeding planned: Bottle  Steward DroneVeronica Rogers BSN, SNM 10/23/2016, 12:23 AM  The above was performed under my direct supervision and guidance.

## 2016-10-23 NOTE — Progress Notes (Signed)
Received pt by EMS.  Pt sleepy, answering some questions. Irritable when trying to help her change into gown.  Cervix is complete.  To birthing suites via stretcher.  Alison StallingKatherine Koostra CNM accompanied to rm [id number].

## 2016-10-23 NOTE — Progress Notes (Signed)
Pt moving arm with BP and s/o has arrived.

## 2016-10-23 NOTE — Progress Notes (Signed)
Ptr to PP room 123.  Report has been given to Joeseph AmorMichele Hawkins, RN prior to pt's arrival on unit.

## 2016-10-23 NOTE — Progress Notes (Signed)
Pt to NBN to see infant.

## 2016-10-23 NOTE — Progress Notes (Addendum)
INFECTIOUS DISEASE PROGRESS NOTE  ID: Tamara Briggs is a 31 y.o. female with  Active Problems:   Normal labor  Subjective: I"m depressed, I really don't want to talk to you right now.   Pt states she has not missed any ART.  She states the medication gave her baby a birth defect.  She states concerns about drug interactions between her mental health medications and her HIV medications.  On further asking, she states that they made her nauseated. She states she did inform her HIV MD this.  She states she has been in contact with adherence RN from ID clinic.   Abtx:  Anti-infectives    Start     Dose/Rate Route Frequency Ordered Stop   10/23/16 1300  atazanavir-cobicistat (Evotaz) 300-150 MG per tablet 1 tablet     1 tablet Oral Daily with breakfast 10/23/16 1234     10/23/16 1300  emtricitabine-tenofovir (TRUVADA) 200-300 MG per tablet 1 tablet     1 tablet Oral Daily 10/23/16 1241     10/23/16 1245  atazanavir-cobicistat (Evotaz) 300-150 MG per tablet 1 tablet  Status:  Discontinued     1 tablet Oral Daily 10/23/16 1243 10/23/16 1250   10/23/16 1245  emtricitabine-tenofovir AF (DESCOVY) 200-25 MG per tablet 1 tablet  Status:  Discontinued     1 tablet Oral Daily 10/23/16 1243 10/23/16 1322   10/22/16 2230  ampicillin (OMNIPEN) 2 g in sodium chloride 0.9 % 50 mL IVPB  Status:  Discontinued     2 g 150 mL/hr over 20 Minutes Intravenous Every 6 hours 10/22/16 2214 10/23/16 0334   10/22/16 2230  zidovudine (RETROVIR) 91 mg in dextrose 5 % 100 mL IVPB     2 mg/kg  45.4 kg 109.1 mL/hr over 60 Minutes Intravenous  Once 10/22/16 2215 10/22/16 2335   10/22/16 2230  zidovudine (RETROVIR) 400 mg in dextrose 5 % 100 mL (4 mg/mL) infusion  Status:  Discontinued     1 mg/kg/hr  45.4 kg 11.4 mL/hr  Intravenous Continuous 10/22/16 2215 10/23/16 0334      Medications:  Scheduled: . atazanavir-cobicistat  1 tablet Oral Q breakfast  . benztropine  0.5 mg Oral BID  .  emtricitabine-tenofovir  1 tablet Oral Daily  . ibuprofen  600 mg Oral Q6H  . lurasidone  40 mg Oral Q breakfast  . [START ON 10/24/2016] measles, mumps and rubella vaccine  0.5 mL Subcutaneous Once  . prenatal vitamin w/FE, FA  1 tablet Oral Q1200  . [START ON 10/24/2016] senna-docusate  2 tablet Oral Q24H  . sertraline  50 mg Oral Daily  . [START ON 10/24/2016] Tdap  0.5 mL Intramuscular Once    Objective: Vital signs in last 24 hours: Temp:  [97.8 F (36.6 C)-98.9 F (37.2 C)] 98.9 F (37.2 C) (03/02 0915) Pulse Rate:  [63-111] 63 (03/02 0915) Resp:  [18-20] 20 (03/02 0915) BP: (111-150)/(52-100) 117/63 (03/02 0915) SpO2:  [98 %-99 %] 98 % (03/02 0400)   General appearance: moderate distress, uncooperative and tearful, agitated.  refueses exam.  Lab Results  Recent Labs  10/22/16 2303  WBC 9.2  HGB 11.3*  HCT 32.5*   Liver Panel No results for input(s): PROT, ALBUMIN, AST, ALT, ALKPHOS, BILITOT, BILIDIR, IBILI in the last 72 hours. Sedimentation Rate No results for input(s): ESRSEDRATE in the last 72 hours. C-Reactive Protein No results for input(s): CRP in the last 72 hours.  Microbiology: No results found for this or any previous visit (from the  past 240 hour(s)).  Studies/Results: No results found.  HIV 1 RNA Quant (copies/mL)  Date Value  07/08/2016 5,552 (H)  10/31/2015 188 (H)  06/11/2015 <20   CD4 T Cell Abs (/uL)  Date Value  07/08/2016 410  10/31/2015 600  06/11/2015 740     Assessment/Plan: AIDS +Cocaine, +THC Limited pre-natal care Schizophrenia/Bipolar  Total days of antibiotics: 0 ATVc/Decsovy    She has repeat CD4 and HIV RNA pending.  Genotype pending The status of her child (HIV) is unlcear, Dr Tamara Briggs has discussed with Peds. Per their note, on anti-virals. Peri-natal AZT given.  The status of her child (child protective services) is unclear. A social worker is here now assessing if she will be able to keep the child.  I offered to  make her a f/u appt with the ID clinic and she made it clear that she could do this herself.  Ms Tamara Briggs (ID adherence nurse) and Social Worker for child protective services are currently in with pt.   My great appreciation to [facility] for caring for this patient.   ID available as needed.        Tamara Briggs Infectious Diseases (pager) [phone] [email or web address] 10/23/2016, 5:15 PM  LOS: 1 day

## 2016-10-23 NOTE — Progress Notes (Signed)
Pt was sitting up in bed when I arrived. Her aid, Amber, was present as well as her nurse during the first part of our visit. Her stepfather joined us. Following visit w/Amber, her stepfather remained. Pt held baby and asked me to pray for him and his new home. She held the baby during prayer and was appropriately tearful. She articulated that she wants what is best for her son and presented peace and acceptance of her decision.  Pt was very thankful for prayer and visit. I encouraged her to ask if additional support is needed prior to my follow-up visit over the weekend.   Chaplain Marjory LiesPamela Carrington Holder, M.Div.   10/23/16 1800  Clinical Encounter Type  Visited With Patient

## 2016-10-23 NOTE — Progress Notes (Addendum)
CPS worker/Jerin Markham Jordanlliot here to meet with MOB.  Ambre McNeil/RN from HIV Clinic was present with MOB at this time.  MOB was tearful when CSW introduced CPS worker and asked CSW to stay with her.  Mr. Markham Jordanlliot informed MOB that her baby will not be able to discharge to her care from the hospital and that a Child and Family Team Meeting will need to take place to determine a plan.  He requests that MOB give him names of anyone she would like for him to explore as a potential placement for baby.  MOB was hysterical, screaming profanity and became completely uncooperative.  She picked up baby, who was sleeping in the bassinet.  She called FOB to tell him they current situation.  CSW became concerned that the baby was not safe to be held by mother due to her current behavior.  MOB had baby on his back in one hand and the phone in her other hand and was yelling and cursing at FOB, CPS worker and CSW.  CSW asked MOB to take a breath and calm down or CSW would feel it was necessary to take the baby to the nursery.  CSW informed MOB that CSW is concerned for baby's safety because MOB is so upset.  MOB calmed some, but CSW requests of staff that if there are any further concerns that the baby be taken to the nursery for safety.  MOB told CSW that she was no longer talking to CSW and CSW left MOB to speak with CPS worker privately.  CSW contacted CPS supervisor/A. Turner to discuss whether emergency custody is appropriate.  She spoke with [facility] attorney who states they do not have grounds at this point to take emergency custody.  Supervisor requests that weekend CSW call Extended Services if any further safety concerns arise over the weekend.  A Child and Family Team meeting will [facility] held on Monday [facility].  Baby cannot discharge until safe disposition plan is determined at this meeting.

## 2016-10-24 NOTE — Progress Notes (Signed)
Per night shift RN, pt slept most of the night.  Pt's baby was brought to her room at 0845.  Pt kept sleeping until the baby began to be fussy, then she got up right away to care for him.  Pt's baby was transferred to the NICU at 1045.  Pt was advised she could go with the RN and baby, pt declined and went back to sleep.  Pt was encouraged again later that she could go anytime to see the baby.  Pt kept sleeping, woke up for meds.  Pt slept most of the day shift until her sister just came in, and they are going up to the NICU to see the baby.  Tamara BisonBettie Juliann Olesky RCharity fundraiser

## 2016-10-24 NOTE — Progress Notes (Signed)
Very brief visit w/pt as she was resting. She said she was ok for now. Please page if support is needed. Chaplain Elmarie Shileyamela Carrington DorrHolder, [place]   10/24/16 1800  Clinical Encounter Type  Visited With Patient

## 2016-10-24 NOTE — Progress Notes (Signed)
This nurse entered room for report and found patient asleep. I introduced myself to family at bedside and pt remained asleep.

## 2016-10-24 NOTE — Progress Notes (Signed)
Pt sleeping when entering for scheduled Ibuprofen. Woke pt up for med. Pt never made eye contact with me and hardly opened eyes. Asked her a few questions about her baby and her responses were one word answers with very flat affect. Pt was asked if she was planning to visit her baby in NICU and pt responded," I already have". This nurse concerned about this pt. Taking her baby home.

## 2016-10-24 NOTE — Progress Notes (Signed)
This nurse rounded on pt to find her sound asleep. Pt very thin and discussed with extended family her dietary habits. Found out that it is not unusual for pt to go several hours without eating much. Will encourage pt to eat. Also found out that pts mother died recently.

## 2016-10-24 NOTE — Progress Notes (Signed)
Post Partum Day #1 Subjective: no complaints, up ad lib and tolerating PO; pt was sleeping with the covers over her head, but was wiling to answer questions; baby is in the nursery currently; bottlefeeding; had visit by ID yesterday and HIV & psych meds have been restarted; per lab, CD4 unable to be run over the weekend and wasn't drawn prior to noon yesterday, so will have to wait until Monday   Objective: Blood pressure 109/79, pulse 62, temperature 97.9 F (36.6 C), temperature source Oral, resp. rate 19, weight 45.4 kg (100 lb 1.6 oz), SpO2 100 %, unknown if currently breastfeeding.  Physical Exam:  General: cooperative, fatigued and no distress Lochia: appropriate Uterine Fundus: firm DVT Evaluation: No evidence of DVT seen on physical exam.   Recent Labs  10/22/16 2303  HGB 11.3*  HCT 32.5*    Assessment/Plan: Plan for discharge tomorrow- pl ans to room in with baby who is under observation for NAS and also awaiting interdisciplinary meeting re placement on Monday   LOS: 2 days   Cam HaiSHAW, KIMBERLY CNM 10/24/2016, 9:57 AM

## 2016-10-25 MED ORDER — LURASIDONE HCL 40 MG PO TABS: 40.0000 mg | ORAL_TABLET | Freq: Every day | ORAL | 0 refills | Status: DC

## 2016-10-25 MED ORDER — ACETAMINOPHEN 325 MG PO TABS: 650.0000 mg | ORAL_TABLET | ORAL | 0 refills | Status: DC | PRN

## 2016-10-25 NOTE — Progress Notes (Signed)
Discharge paperwork reviewed with patient. Patient verbalized understanding of medication instructions, follow up appointments to be made, and all other education materials presented. Patient stated she had no questions and signed the discharge paperwork. Patient up to see baby in NICU and awaiting transportation home.

## 2016-10-25 NOTE — Progress Notes (Signed)
CSW received a phone call from Betsy, RN noting MOB is ready for d/c and is growing agitated that she has not been d/c yet. Betsy verbalized there was a psych consult put in for patient on 10/23/2016 with a 24hr response; however, the consult has not been completed yet. Betsy is concerned for MOB to d/c prior to consult completion due to hx of non compliance. Betsy noted there is a team meeting tomorrow regarding baby's placement plan and MOB has noted she lives alone. Betsy noted concern for MOB's transportation back to the hospital for meeting tomorrow.   CSW contacted resident Ana to inquire if she heard from psych regarding consult completion. Ana noted she has not. Ana provided CSW with phone number to on call psych resident Hifada. This writer contacted Hifada to inquire about consult. Hifada noted she will see patient today; however, it may be later in the day due to consults in [place] [facility] ED. Resident Ana and Betsy,RN have both been made aware of this. Betsy has concern that patient will sign out AMA if psych consult takes too long as she is already notably agitated. CSW attempted to meet with MOB. Upon arrival, MOB was notably agitated and did not want to speak to CSW. CSW noted MOB eyes were closed while lying in bed; however, movements noted MOB was awake and aware of this writers presence.   Grace Haggart, MSW, LCSW-A Clinical Social Worker  [facility]  Office: [phone]  

## 2016-10-25 NOTE — Progress Notes (Addendum)
Per psych recommendation, [facility]. Due to it being the weekend, CSW is unable to make contact with [facility] to schedule appointment. [facility] at [address] [place] M-F 8:30AM-5PM for walk-in intake appointment for medication management. Per Sheralyn BoatmanAna Amaral,MD conversation with patient, she has received [facility] before. CSW informed Lyndee HensenBetsy RN of the above plan. Tamela OddiBetsy noted she will inform patient.   Aulani Shipton, MSW, LCSW-A Clinical Social Worker  [facility]  Office: [phone]

## 2016-10-25 NOTE — Progress Notes (Signed)
Nurse reports patient is getting agitated. Allow blood to be drawn but wants to leave. Called psychatrist on call today, Dr Hermelinda MedicusHifada and will evaluate her later today  Pt to stay as inpatient until psych consult  Adair LaundryAna Carvalho do Amaral MD PGY1 [facility] Fam Med Res Prog Hvl

## 2016-10-25 NOTE — Progress Notes (Signed)
CSW received a phone call from Onalee Huaavid with telepsych who noted he spoke with the attending covering the patient Campbell Tamara Briggs, and he noted telepsych was not needed. Onalee Huaavid noted Casimiro NeedleMichael informed him the only reason for the psych consult should have been for medication management.   This writer informed Tamela OddiBetsy, RN and Darien RamusAna, MD of this.   Mumtaz Lovins, MSW, LCSW-A Clinical Social Worker  [facility]  Office: [phone]

## 2016-10-25 NOTE — Progress Notes (Signed)
POSTPARTUM PROGRESS NOTE  Post Partum Day 2 Subjective:  Tamara Briggs is a 31 y.o. [id number] 4015w4d s/p SVD.  No acute events overnight.  Pt denies problems with ambulating, voiding or po intake.  She denies nausea or vomiting.  Pain is well controlled.  She has had flatus. She has not had bowel movement.  Lochia Minimal.   Objective: Blood pressure 110/62, pulse (!) 54, temperature 98.5 F (36.9 C), temperature source Oral, resp. rate 16, weight 100 lb 1.6 oz (45.4 kg), SpO2 100 %, unknown if currently breastfeeding.  Physical Exam:  General: alert, cooperative and no distress Lochia:normal flow Chest: no respiratory distress Heart:regular rate, distal pulses intact Abdomen: soft, nontender,  Uterine Fundus: firm, appropriately tender DVT Evaluation: No calf swelling or tenderness Extremities: no edema   Recent Labs  10/22/16 2303  HGB 11.3*  HCT 32.5*    Assessment/Plan:  ASSESSMENT: Tamara Briggs is a 31 y.o. [id number] 215w4d s/p SVD, complicated by HIV infections and schizofrenia, in addition to substance abuse.  # SVD#2 -- progressing as expected # HIV infection: -- CD4 requested by ID could not be drawn on Friday, so patient stayed.  I contacted lab, CD4 may be drawn today and processed tomorrow, it has a 48h stand by. # Schizophrenia: -- Psych consult placed on Friday. Will contact physician on call to day to ask if patient can be d/c today or if she needs to stay for evaluation tomorrow.     LOS: 3 days   Tamara Settlena Carvalho do AmaralMD 10/25/2016, 11:58 AM   OB FELLOW POSTPARTUM PROGRESS NOTE ATTESTATION  I have seen and examined this patient and agree with above documentation in the resident's note. See my full documentation on discharge summary from same day.   Tamara PennaNicholas Zoey Bidwell, MD 9:52 AM

## 2016-10-25 NOTE — Progress Notes (Signed)
Notified SW Ward and DR Nehemiah SettleAna Carvalho that pt is getting agitated about her dischage.

## 2016-10-25 NOTE — BH Assessment (Addendum)
[facility] Assessment Progress Note  Consult was called in this date for TTS. After note review Hisada MD is scheduled to see patient this date. Consult is in reference to medication management. This Clinical research associatewriter contacted Gen[employer]na Carvalho Janine Oreso Amaral MD [phone] who initiated consult and ask for MD to D/C consult. MD agreed to D/C TTS consult 15:48.

## 2016-10-25 NOTE — Discharge Summary (Signed)
OB Discharge Summary     Patient Name: Tamara Briggs DOB: 1986-01-06 MRN: [id number]  Date of admission: 10/22/2016 Delivering MD: Sharyon Cable   Date of discharge: 10/25/2016  Admitting diagnosis: 36w possible labor, eval Intrauterine pregnancy: [redacted]w[redacted]d     Secondary diagnosis:  Active Problems:   Normal labor  Additional problems: HIV infection, Schizophrenia, Hx of Polysubstance abuse     Discharge diagnosis: Preterm Pregnancy Delivered                                                                                                Post partum procedures:none  Augmentation: none  Complications: None  Hospital course:  Onset of Labor With Vaginal Delivery     31 y.o. yo [id number] at [redacted]w[redacted]d was admitted in Active Labor on 10/22/2016. Patient had an uncomplicated labor course as follows:  Membrane Rupture Time/Date: 12:01 AM ,10/23/2016   Intrapartum Procedures: Episiotomy: None [1]                                         Lacerations:  None [1]  Patient had a delivery of a Viable infant. 10/23/2016  Information for the patient's newborn:  Alisea, Matte [[id number]]  Delivery Method: Vaginal, Spontaneous Delivery (Filed from Delivery Summary)    Pateint had an uncomplicated postpartum course.  She is ambulating, tolerating a regular diet, passing flatus, and urinating well. Patient is discharged home in stable condition on 10/25/16.   Physical exam  Vitals:   10/23/16 1730 10/24/16 0500 10/24/16 1921 10/25/16 0620  BP: 108/87 109/79 109/72 110/62  Pulse: 67 62 (!) 52 (!) 54  Resp: 20 19 16 16   Temp: 98.6 F (37 C) 97.9 F (36.6 C) 98.9 F (37.2 C) 98.5 F (36.9 C)  TempSrc: Oral Oral Oral Oral  SpO2: 100%     Weight:       General: alert Lochia: appropriate Uterine Fundus: firm Incision: N/A DVT Evaluation: No evidence of DVT seen on physical exam. Labs: Lab Results  Component Value Date   WBC 9.2 10/22/2016   HGB 11.3 (L) 10/22/2016   HCT 32.5 (L) 10/22/2016    MCV 84.6 10/22/2016   PLT 139 (L) 10/22/2016   CMP Latest Ref Rng & Units 07/08/2016  Glucose 65 - 99 mg/dL 13(Y)  BUN 7 - 25 mg/dL 9  Creatinine 8.65 - 7.84 mg/dL 6.96  Sodium 295 - 284 mmol/L 135  Potassium 3.5 - 5.3 mmol/L 3.4(L)  Chloride 98 - 110 mmol/L 102  CO2 20 - 31 mmol/L 24  Calcium 8.6 - 10.2 mg/dL 8.6  Total Protein 6.1 - 8.1 g/dL 7.0  Total Bilirubin 0.2 - 1.2 mg/dL 0.2  Alkaline Phos 33 - 115 U/L 64  AST 10 - 30 U/L 17  ALT 6 - 29 U/L 11    Discharge instruction: per After Visit Summary and "Baby and Me Booklet".  After visit meds:  Allergies as of 10/25/2016   No Known Allergies     Medication List  STOP taking these medications   metroNIDAZOLE 500 MG tablet Commonly known as:  FLAGYL   ondansetron 8 MG disintegrating tablet Commonly known as:  ZOFRAN-ODT   oxyCODONE-acetaminophen 10-325 MG tablet Commonly known as:  PERCOCET   terconazole 0.8 % vaginal cream Commonly known as:  TERAZOL 3     TAKE these medications   acetaminophen 325 MG tablet Commonly known as:  TYLENOL Take 2 tablets (650 mg total) by mouth every 4 (four) hours as needed (for pain scale < 4).   atazanavir-cobicistat 300-150 MG tablet Commonly known as:  EVOTAZ Take 1 tablet by mouth daily. Swallow whole. Do NOT crush, cut or chew tablet. Take with food.   benztropine 0.5 MG tablet Commonly known as:  COGENTIN Take 1 tablet (0.5 mg total) by mouth 2 (two) times daily.   emtricitabine-tenofovir 200-300 MG tablet Commonly known as:  TRUVADA Take 1 tablet by mouth daily.   ENSURE Take 237 mLs by mouth 2 (two) times daily between meals.   IBU 800 MG tablet Generic drug:  ibuprofen Take 800 mg by mouth 2 (two) times daily as needed.   LATUDA 40 MG Tabs tablet Generic drug:  lurasidone Take 40 mg by mouth daily with breakfast.   OB COMPLETE PETITE 35-5-1-200 MG Caps Take 1 tablet by mouth daily.   sertraline 50 MG tablet Commonly known as:  ZOLOFT Take 1 tablet  (50 mg total) by mouth daily.       Diet: routine diet  Activity: Advance as tolerated. Pelvic rest for 6 weeks.   Outpatient follow up:6 weeks Follow up Appt: [facility]. Future Appointments Date Time Provider Department Center  06/03/2017 1:45 PM Nilda RiggsNancy Carolyn Martin, NP [facility] None   Follow-up Information    [facility]. Schedule an appointment as soon as possible for a visit in 1 day(s).   Specialty:  Behavioral Health Why:  Scchedule an app to be seen as soon as possible. Our SW, Eaton CorporationChanelle Ward will follow up on the app Contact information: [address] [place] [address] [phone]        [facility] AD[facility] Follow up.   Why:  as needed for emergencies Contact information: [address] [id number] [place]c [place] [id number] [phone]       Johny SaxJeffrey Hatcher, MD. Schedule an appointment as soon as possible for a visit.   Specialty:  Infectious Diseases Why:  Schedule app at the infectious diseases clinic to follow up next week Contact information: [address] [place] [address] [phone]        [facility] Follow up.   Why:  Schedule app for 4-6 weeks postpartum. Do not have intercourse until your app. Pt advised Contact information: [address] [place] [phone]          Follow up Visit:No Follow-up on file.  Postpartum contraception: Unsure  Newborn Data: Live born female  Birth Weight: 4 lb 8.5 oz (2055 g) APGAR: 9, 10  Baby Feeding: Bottle Disposition:NICU, mother will not have custody   10/25/2016 Nehemiah SettleAna Carvalho do Silvio ClaymanAmaral, MD  OB Fellow attestation I have seen and examined this patient and agree with above documentation in the residents's note.   Gretta CoolMaysha Holtmeyer is a 31 y.o. [id number] s/p SVD.   Pain is well controlled.  Plan for birth control is  unsure.  Method of Feeding: no  PE:  BP 104/64   Pulse 72   Temp 98.7 F (37.1 C) (Oral)   Resp 18   Wt 100 lb 1.6 oz (45.4 kg)   LMP  (LMP Unknown)   SpO2 100%   Breastfeeding? Unknown   BMI 17.18 kg/m  Fundus firm Homan's sign negative, trace edema B/l  No results for input(s): HGB, HCT in the last 72 hours.   Plan: discharge today - postpartum care discussed - f/u clinic in 6 weeks for postpartum visit -plan to follow up with ID for CD4 count continue current regimen -[facility],  Continue latuda at this time.    Ernestina Penna, MD 9:50 AM

## 2016-10-25 NOTE — Progress Notes (Signed)
Ms Celia is a 31yo [id number] female admitted for preterm labor at [redacted]w[redacted]d She delivered a viable infant on 10/23/16. She has a PMHx of schizophrenia and HIV infection. She was unsure of her meds, but states [facility]. At times she says she was regularly taking her psych meds and sometimes she says otherwise. Denies hallucinations, denies paranoia, denies suicidal ideation, denies homicidal ideation.  Psych consult was put on Friday. Since pt met medical criteria to be discharged today, we contacted psych on call (patient hasn't been seen by specialist) to verify medication and if they would wish to consult her before d/c. Case was discussed extensively between myself, my attending Dr MArlina Robes Dr RNorman Claypsych, [facility]. Per Dr EMarjory Liesorders, he spoke with Dr HModesta Messingwho recommended patient to be discharged on her medication (which is consistent with Dr HIvor Reiningnote) and [facility] Chanelle, SW, called me and since today is Sunday, she says an app will be set up tomorrow and since this [facility]) She will verify if the app has been scheduled.   Patient confirmed verbally to me and to her RShelly Bombard BGwinda Passe that she does not have thoughts/feelings of hurting herself or others. She denies hallucinations or paranoia's.   She also had Cd4 ordered by ID drawn today.   At the time of my visit patient was calm in bed. She says she leaves alone and has friends to help her at home.   AFlora LippsMD PGY1 [place] Patient was discussed with my attending Dr MArlina Robes who agrees with my A&P unles otherwise stated at his note/addendum

## 2016-10-25 NOTE — Progress Notes (Signed)
Pt was sleeping, arouses to voice. Pt is calm, resting, & appropriate. I offered to order lunch, pt declines lunch.

## 2016-10-25 NOTE — Consult Note (Signed)
Brief psychiatry consult note.  Discussed with Dr. Nehemiah SettleAna Carvalho do Amaral and Dr. Chriss CzarErvin Micheal. Patient is a 31 yo postpartum lady with schizophrenia, substance use disorder, HIV. Psychiatry is consulted for medication management given non compliance to her medication. Patient is scheduled to be discharged today. Per report, patient has no known reported history of SI, HI or hallucinations during admission (Dr. Silvio ClaymanAmaral to check these symptoms with the patient today). Given her lack of active psychotic symptoms/safety issues, will advise the patient to have outpatient appointment with a psychiatrist for medication management. Also discussed with Ms. Chanelle Ward, MSW, who is in agreement with plans.   - Continue lurasidone 40 mg daily, sertraline 50 mg daily, cogentin 0.5 mg BID,  - Ms. Chanelle Ward, MSW to make follow up (intake) appointment at the earliest date with a psychiatrist for medication management

## 2016-10-25 NOTE — Progress Notes (Signed)
I spoke to MD Nehemiah SettleAna Carvalho DO Silvio ClaymanAmaral about pt's readiness for discharge. The psych consult has not been completed. Pt sleeps often, when a wake she does follow instructions. She states she lives alone. I am concerned about Ms Solon AugustaBynum ability to take her meds and care for herself. Dr Foye Clockarvalho DO Silvio ClaymanAmaral will consult with psych. I also Call Social worker C Ward and shared these concerns.

## 2016-10-25 NOTE — Progress Notes (Signed)
Pt was lying in bed resting when I arrived. She said she wanted prayer for her baby. Pt said he is eating better and that she is better. Had prayer w/pt and then she returned to her rest. Please page if additional support is needed. Chaplain Marjory LiesPamela Carrington Holder, M.Div.   10/25/16 1100  Clinical Encounter Type  Visited With Patient

## 2016-10-26 ENCOUNTER — Ambulatory Visit: Payer: Self-pay | Admitting: *Deleted

## 2016-10-26 DIAGNOSIS — B20 Human immunodeficiency virus [HIV] disease: Secondary | ICD-10-CM

## 2016-10-26 DIAGNOSIS — F1410 Cocaine abuse, uncomplicated: Secondary | ICD-10-CM

## 2016-10-26 NOTE — Progress Notes (Signed)
CLINICAL SOCIAL WORK MATERNAL/CHILD NOTE  Patient Details  Name: Tamara Briggs MRN: [id number] Date of Birth: 06-Jun-1986  Date:  10/23/2016  Clinical Social Worker Initiating Note:  Terri Piedra, [place]      Date/ Time Initiated:  10/23/16/1000              Child's Name:  Tamara Briggs.   Legal Guardian:  Other (Comment) (Parents: Tamara Briggs and Tamara Briggs)   Need for Interpreter:  None   Date of Referral:  10/23/16     Reason for Referral:  Behavioral Health Issues, including SI , Current Substance Use/Substance Use During Pregnancy , Late or No Prenatal Care    Referral Source:  [facility]   Address:  [address] [place], [place], [place] [address].  Per MOB, FOB lives at [address] [place].  Phone number:  [phone]   Household Members: Self (MOB lives in a boarding house with other tennants.  FOB lives in a separate bording house with other tennants, per MOB.)   Natural Supports (not living in the home): Spouse/significant other (MOB reports that FOB is supportive.  MOB has limited supports.  She speaks about a sister named Tamara Briggs and her step-father.  She also notes that her ex-boyfriend, Tamara Briggs, is supportive.)   Professional Supports:Home Care Staff, Therapist (MOB has a [place] and states she attends therapy and medication management at [facility])   Employment:Disabled   Type of Work:     Education:      Designer, fashion/clothing, Medicaid   Other Resources: Physicist, medical , [facility]   Cultural/Religious Considerations Which May Impact Care:None stated.  MOB's facesheet notes that she is Non-Denominational.  Strengths: Pediatrician chosen  (MOB states pediatric follow up will be at [facility] on [address])   Risk Factors/Current Problems: Basic Needs , Mental Health Concerns , Substance Use  (MOB has a long hx of substance use and was positive for cocaine and THC on admission.   Baby's UDS is positive for cocaine.  MOB has dx's of Schizophrenia and Bipolar.  She is also HIV positive.)   Cognitive State: Alert , Able to Concentrate , Loosening of Associations , Poor Insight    Mood/Affect: Agitated , Angry , Tearful , Labile    CSW Assessment:CSW met with MOB in her first floor room/123 to offer support and complete assessment due to current substance use and mental illness.  MOB is also HIV positive and CSW will arrange ID Clinic follow up for baby.  MOB is known to CSW from previous hospitalizations.  CSW met with MOB on 06/30/16 at approximately [redacted] weeks pregnant and also after the loss of her daughter Tamara Briggs born at 16 weeks in May of 2015.   MOB was smiling and holding baby as CSW entered the room.  MOB states she remembers CSW and welcomed the visit.  A man she identified as the Tamara Briggs was with her and stepped in to the bathroom.  MOB asked CSW to come close so she could whisper.  MOB informed CSW that FOB was not aware of her HIV status or cocaine use.  CSW asked if CSW could ask FOB to step out to provide MOB time to speak with CSW privately.  MOB agreed and thanked CSW.  When FOB came out of the bathroom, CSW asked him to step out of the room for privacy.  FOB did not acknowledge CSW's request and sat down and started to use his cell  phone.  MOB then asked him to leave.  He got up and slowly gathered his things and put his jacket on.  He mumbled under his breath to MOB throughout this entire time in an angry tone, but CSW could not understand what he was saying.   FOB left the room and CSW asked MOB to clarify that she had told CSW that he does not know of her HIV status.  She then told CSW that he does know.  CSW is still unclear as to whether or not FOB knows.  She states he does not know of her cocaine use.   CSW asked how baby is doing, acknowledging that he was just born early this morning.  MOB states baby is doing great.  CSW asked  specifically how he is eating, as he is a late preterm baby and attempted to prepare MOB for the possibility that he may need care in the NICU prior to discharge because of this.  MOB stated that she knew this and told CSW that baby "eats the same as he did when he was in me; like every three hours."   CSW inquired about MOB's last use of substances.  MOB admits to using marijuana approximately a week ago and states that she did not know it had cocaine in it.  She admits to knowingly using crack cocaine a week prior to that (approximately 2 weeks ago).  With this being said, MOB reports that she is clean from drugs.  CSW explained that her use has been long term and that although CSW commends her for not using this week, states that her recent use is a concern for CSW.  She does not think she needs inpatient treatment, but states she is "interested in classes."  CSW to explore this further with MOB.  CSW informed MOB of need to make report to Child Protective Services due to recent cocaine and marijuana use and informed MOB that she was positive for both substances on admission.  MOB was appropriately concerned and asked "are they going to take my baby?"  CSW explained role and informed MOB of possible scenarios.  CSW informed MOB that a CPS worker will come see her at the hospital prior to discharge.  CSW inquired about the substance abuse program she was in when CSW spoke with her in November with [facility].  MOB explained that she left this program because the staff person was "stealing people's bus passes and medication."     CSW inquired about MOB's mental health treatment at this time.  MOB has a dx of Schizophrenia and Bipolar.  She reports going to [facility] every 3 weeks and states she is taking Zoloft and Latuda as prescribed.  CSW commends her for this and provided education regarding PMADs.  CSW is concerned that MOB is at a heightened risk of developing PMADs given her mental health, CPS  involvement, and lack of support.   MOB reports that she currently has stable housing, which she secured on her own.  She reports she is living in a boarding house and pays $475 per month from her Disability to have her own room and bathroom.  She states two other tennents live in the house.  She states FOB lives in a similar arrangement, but does not live with her.  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  At this time, she states no question and reports that she was "with him (Mr. Nori Riis) my whole pregnancy," and added, "I counted my dates."  MOB reports that they met at a gas station.  She states "Tamara Briggs" Herbie Baltimore Mason-father of her previous child) is still a support for her, but that they have not been in a relationship since their daughter passed away.  She reports that her step-father/Michael Thurmond Butts is also a good support.  She did not identify any other supports (other than FOB) at this time.  She states they have a good relationship and that he is currently working for a temp agency.   MOB reports professional supports as Kinnie Scales "from the [place] office," and Leda Roys "my pregnancy care manager."  MOB proceeded to show CSW numerous business cards in her wallet, including both of these professionals.  Ms. Guido Sander card is a [place] card and it is CSW's understanding from MOB's medical record that Ms. Leonides Schanz is an Therapist, sports through the HIV clinic who visits MOB in her home.   MOB reports that she has begun preparing for baby at home and has a crib and some clothes.  MOB has knowledge of SIDS and was able to tell CSW the necessary precautions to prevent SIDS.   CSW asked MOB about her past deliveries, noting that her chart states she had a child prior to her daughter in 40.  CSW was not aware that she had another child.   MOB states her daughter/Tamiyah Ronnald Ramp was born 04/10/08 and lives with her father.  She states, "I called CPS on myself."  MOB began to cry and told CSW that she has not seen her daughter since she was 36 years old.  CSW asked MOB how she feels about becoming a mother again.  She states "it feels good, but it brings back memories."  She states she is trying not to think about the past.   CSW is also aware that MOB's mother died in 28-Jun-2016 and notes multiple losses throughout MOB's life.  This is the happiest CSW has seen MOB, however, still has grave concerns about her ability to care for a baby.  CSW will follow closely.  Baby may not discharge until CPS has made safe disposition plan.     CSW Plan/Description: Child Protective Service Report , Patient/Family Education , Psychosocial Support and Ongoing Assessment of Needs    Alphonzo Cruise, [place] 10/23/2016, 10:00 AM

## 2016-10-27 ENCOUNTER — Encounter: Payer: Medicaid Other | Admitting: Obstetrics and Gynecology

## 2016-10-28 LAB — T-HELPER CELLS (CD4) COUNT (NOT AT ARMC)
CD4 % Helper T Cell: 30 % — ABNORMAL LOW (ref 33–55)
CD4 T CELL ABS: 250 /uL — AB (ref 400–2700)

## 2016-11-01 LAB — HIV-1 RNA ULTRAQUANT REFLEX TO GENTYP+
HIV-1 RNA BY PCR: 640000 copies/mL
HIV-1 RNA Quant, Log: 5.806 log10copy/mL

## 2016-11-03 NOTE — Telephone Encounter (Signed)
RN made a visit with Tamara Briggs today. She has just delivered her son. He is premature and tested positive for Cocaine and Marijuiana in his blood stream. During my visit with Tamara Briggs CPS made a visit and informed Tamara Briggs that they will be taking custody of the patient at this time. At this time a recertification is needed  Patient ID: Tamara Briggs, female   DOB: 06/30/1986, 31 y.o.   MRN: [id number]  Initial Order received on 07/27/2016 by Tamara Briggs to [facility]  Patient was 1st evaluated on 07/29/2016 with initial contact made on 07/27/16. ReEvaluation: 10/23/16.  Frequency / Duration of [facility] visits, Effective 10/26/2016:  512mo2, 401mo1  4PRN's for complications with disease process/progression, medication changes or concerns   [facility] will assess for learning needs related to diagnosis and treatment regimen, provide education as needed, fill pill box if needed, address any barriers which may be preventing medication compliance, and communicating with care team including physician and case manager.   Individualized Plan Of Care Certification Period of 10/27/2015 to 01/24/2017  a. Type of service(s) and care to be delivered: RN Case Management  b. Frequency and duration of service: Effective 10/26/2016: 12mo2, 291mo1 4 prns for complications with disease process/progression, barriers to care,  medication changes or concerns   c. Activity restrictions: Pt may be up as tolerated and can safely ambulate without the need for a assistive device   d. Safety Measures: Standard Precautions/Infection Control   e. Service Objectives and Goals: Service Objectives are to assist the pt with HIV medication regimen adherence and staying in care with the Infectious Disease Clinic by identifying barriers to care. RN will address the barriers that are identified by the patient.     Pt's current substance abuse and documented Mental Health creates a barrier  for the patient. Tamara Briggs is seeking another substance abuse program that provides housing which is a requirement before receiving custody of her son.  Patient Centered Goal is to provide Physical support and  medical guidance to the patient and her unborn baby. RN will coordinate with other community resources including Mental Health for the patient to receive mental health services so she can began to meet her goal of HIV medication  compliance.  f. Equipment required: No additional equipment needs at this time   g. Functional Limitations: Vision   h. Rehabilitation potential: Guarded   i. Diet and Nutritional Needs: Regular Diet   j. Medications and treatments: Medications have been reconciled and reviewed and are a part of EPIC electronic file   k. Specific therapies if needed: RN   l. Pertinent diagnoses: HIV disease,  Hx of medication NonCompliance, High Risk pregnancy  m. Expected outcome: Guarded     Tamara Briggs states she has plans of keeping her baby, staying in care on her HIV medications and staying clean from crack cocaine use.

## 2016-11-04 LAB — REFLEX TO GENOSURE(R) MG

## 2016-11-10 ENCOUNTER — Encounter: Payer: Self-pay | Admitting: Pharmacist Clinician (PhC)/ Clinical Pharmacy Specialist

## 2016-11-10 ENCOUNTER — Ambulatory Visit (INDEPENDENT_AMBULATORY_CARE_PROVIDER_SITE_OTHER): Payer: Medicaid Other | Admitting: Internal Medicine

## 2016-11-10 ENCOUNTER — Encounter: Payer: Self-pay | Admitting: Internal Medicine

## 2016-11-10 VITALS — BP 110/77 | HR 80 | Temp 98.3°F | Wt 94.0 lb

## 2016-11-10 DIAGNOSIS — F1410 Cocaine abuse, uncomplicated: Secondary | ICD-10-CM

## 2016-11-10 DIAGNOSIS — F209 Schizophrenia, unspecified: Secondary | ICD-10-CM

## 2016-11-10 DIAGNOSIS — B20 Human immunodeficiency virus [HIV] disease: Secondary | ICD-10-CM

## 2016-11-10 DIAGNOSIS — O99323 Drug use complicating pregnancy, third trimester: Secondary | ICD-10-CM | POA: Diagnosis not present

## 2016-11-10 DIAGNOSIS — Z3002 Counseling and instruction in natural family planning to avoid pregnancy: Secondary | ICD-10-CM | POA: Diagnosis not present

## 2016-11-10 LAB — POCT URINE PREGNANCY: Preg Test, Ur: NEGATIVE

## 2016-11-10 MED ORDER — ATAZANAVIR-COBICISTAT 300-150 MG PO TABS: 1.0000 | ORAL_TABLET | Freq: Every day | ORAL | 11 refills | Status: DC

## 2016-11-10 MED ORDER — EMTRICITABINE-TENOFOVIR DF 200-300 MG PO TABS: 1.0000 | ORAL_TABLET | Freq: Every day | ORAL | 11 refills | Status: DC

## 2016-11-10 MED ORDER — MEDROXYPROGESTERONE ACETATE 150 MG/ML IM SUSP
150.0000 mg | Freq: Once | INTRAMUSCULAR | Status: AC
  Administered 2016-11-10: 150 mg via INTRAMUSCULAR

## 2016-11-10 NOTE — Progress Notes (Signed)
HPI: Tamara Briggs is a [age] y.o. female who presents to the [facility] today for follow-up of her HIV infection.  She has frequently been out of care.   Allergies: No Known Allergies  Past Medical History: Past Medical History:  Diagnosis Date  . Anxiety   . Bipolar disorder (HCC)   . Crack cocaine use   . Depression   . 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 Infection   . Otitis media of left ear 03/15/2013  . Schizophrenia (HCC)   . 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 Tardive dyskinesia 06/13/2014    Social History: Social History   Social History  . Marital status: Single    Spouse name: N/A  . Number of children: 1  . Years of education: 11th   Social History Main Topics  . Smoking status: Current Every Day Smoker    Packs/day: 0.10    Years: 3.00    Types: Cigarettes  . Smokeless tobacco: Never Used     Comment: cutting back, 1 cigs/day  . Alcohol use No  . Drug use: Yes    Types: Cocaine, Marijuana     Comment: pt states she used this morning  . Sexual activity: Yes    Partners: Male    Birth control/ protection: None     Comment: declined condoms   Other Topics Concern  . Not on file   Social History Narrative   Patient lives at home with her partner Tamara Briggs.   Unemployed.   Education 11 th grade.   Right handed.   Patient does not drink caffeine.    Current Regimen: Evotaz + Truvada  Labs: HIV 1 RNA Quant (copies/mL)  Date Value  07/08/2016 5,552 (H)  10/31/2015 188 (H)  06/11/2015 <20   CD4 T Cell Abs (/uL)  Date Value  10/25/2016 250 (L)  07/08/2016 410  10/31/2015 600   Hep B S Ab (no units)  Date Value  07/09/2011 POS (A)   Hepatitis B Surface Ag (no units)  Date Value  08/06/2016 Negative   HCV Ab (no units)  Date Value  07/09/2011 NEGATIVE    CrCl: CrCl cannot be calculated (Patient's most recent lab result is older than the maximum 21 days allowed.).  Lipids:    Component Value Date/Time   CHOL 127  10/31/2015 1000   TRIG 97 10/31/2015 1000   HDL 63 10/31/2015 1000   CHOLHDL 2.0 10/31/2015 1000   VLDL 19 10/31/2015 1000   LDLCALC 45 10/31/2015 1000    Assessment: Tamara Briggs is here today for follow-up of her HIV infection. She has been out of HIV care for quite some time. She recently gave birth to a baby boy preterm due to cocaine use.  Her baby had withdrawal symptoms when born and was handed over to foster care. She comes in today and says she has been taking her medications for a couple days now.  I asked her what medications she is on and she knows all of them including their dose.  I explained to her many times the importance of getting drug free and staying on her HIV medications.  She says she knows how important it is but chooses not to take them sometimes.  She says she now plans on trying to stay drug free and maintain on her medications. I will try my best to keep close contact with her. I made an appointment with me for her in 2 weeks.  She will need an appt  with Dr. Drue Second for 5-6 weeks, I will make sure that appt is made.  She also tells me her recent dose increase of the Zoloft is too high.  I told her to call [facility] and discuss with her doctor there.  Plans: - Evotaz once daily + Truvada once daily - F/u with me 4/5 at 1:30pm  Tamara Briggs L. Jolly Bleicher, PharmD, CPP Infectious Diseases Clinical Pharmacist [facility] 11/10/2016, 2:40 PM

## 2016-11-10 NOTE — Progress Notes (Signed)
RFV: hiv follow up  Patient ID: Tamara Briggs, female   DOB: 1986-05-21, 31 y.o.   MRN: [id number]  HPI  Tamara Briggs is a 31yo F who has poorly controlled hiv disease, CD 4 count of 250/VL 640,000. She was last at clinic in mid November, but had missed several id appt during her pregnancy. She was admitted on 3/1 @ [redacted] wk gestation with premature contractions due to cocaine use. She had vaginal delivery of 4Lb baby boy, who had withdrawal symptoms. Baby is in foster care and she has twice a week visitation.  Outpatient Encounter Prescriptions as of 11/10/2016  Medication Sig  . IBU 800 MG tablet Take 800 mg by mouth 2 (two) times daily as needed.  Marland Kitchen LATUDA 40 MG TABS tablet Take 40 mg by mouth daily with breakfast.   . lurasidone (LATUDA) 40 MG TABS tablet Take 1 tablet (40 mg total) by mouth daily with breakfast.  . Prenat-FeCbn-FeAspGl-FA-Omega (OB COMPLETE PETITE) 35-5-1-200 MG CAPS Take 1 tablet by mouth daily.  . sertraline (ZOLOFT) 50 MG tablet Take 1 tablet (50 mg total) by mouth daily.  Marland Kitchen acetaminophen (TYLENOL) 325 MG tablet Take 2 tablets (650 mg total) by mouth every 4 (four) hours as needed (for pain scale < 4). (Patient not taking: Reported on 11/10/2016)  . atazanavir-cobicistat (EVOTAZ) 300-150 MG tablet Take 1 tablet by mouth daily. Swallow whole. Do NOT crush, cut or chew tablet. Take with food. (Patient not taking: Reported on 11/10/2016)  . benztropine (COGENTIN) 0.5 MG tablet Take 1 tablet (0.5 mg total) by mouth 2 (two) times daily. (Patient not taking: Reported on 08/14/2016)  . emtricitabine-tenofovir (TRUVADA) 200-300 MG tablet Take 1 tablet by mouth daily. (Patient not taking: Reported on 11/10/2016)  . ENSURE (ENSURE) Take 237 mLs by mouth 2 (two) times daily between meals. (Patient not taking: Reported on 09/25/2016)   No facility-administered encounter medications on file as of 11/10/2016.      Patient Active Problem List   Diagnosis Date Noted  . Normal labor 10/22/2016    . Previous cesarean section complicating pregnancy 08/31/2016  . GBS (group B Streptococcus carrier), +RV culture, currently pregnant 08/12/2016  . Supervision of high-risk pregnancy 08/06/2016  . Late prenatal care 08/06/2016  . 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 Tardive dyskinesia 06/13/2014  . Preterm premature rupture of membranes (PPROM) delivered, current hospitalization 12/14/2013  . 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 HIV risk factors complicating pregnancy 10/18/2013  . Schizophrenia (HCC) 10/18/2013  . Tobacco abuse 03/15/2013  . h/o crack Cocaine abuse 03/15/2013  . HIV (human immunodeficiency virus infection) (HCC) 07/27/2011     Health Maintenance Due  Topic Date Due  . INFLUENZA VACCINE  03/24/2016     Review of Systems Per hpi otherwise 10 point ros is negative Physical Exam   BP 110/77   Pulse 80   Temp 98.3 F (36.8 C) (Oral)   Wt 94 lb (42.6 kg)   BMI 16.14 kg/m   Physical Exam  Constitutional:  oriented to person, place, and time. appears well-developed and well-nourished. No distress.  HENT: [place]/AT, PERRLA, no scleral icterus Mouth/Throat: Oropharynx is clear and moist. No oropharyngeal exudate.  Cardiovascular: Normal rate, regular rhythm and normal heart sounds. Exam reveals no gallop and no friction rub.  No murmur heard.  Pulmonary/Chest: Effort normal and breath sounds normal. No respiratory distress.  has no wheezes.  Neck = supple, no nuchal rigidity Abdominal: Soft. Bowel sounds are normal.  exhibits no  distension. There is no tenderness.  Lymphadenopathy: no cervical adenopathy. No axillary adenopathy Neurological: alert and oriented to person, place, and time.  Skin: Skin is warm and dry. No rash noted. No erythema.  Psychiatric: unusually upbeat with inappropirate laughter, not angry  Lab Results  Component Value Date   CD4TCELL 30 (L) 10/25/2016   Lab Results  Component Value Date   CD4TABS 250 (L) 10/25/2016    CD4TABS 410 07/08/2016   CD4TABS 600 10/31/2015   Lab Results  Component Value Date   HIV1RNAQUANT 5,552 (H) 07/08/2016   Lab Results  Component Value Date   HEPBSAB POS (A) 07/09/2011   Lab Results  Component Value Date   LABRPR Non Reactive 10/22/2016    CBC Lab Results  Component Value Date   WBC 9.2 10/22/2016   RBC 3.84 (L) 10/22/2016   HGB 11.3 (L) 10/22/2016   HCT 32.5 (L) 10/22/2016   PLT 139 (L) 10/22/2016   MCV 84.6 10/22/2016   MCH 29.4 10/22/2016   MCHC 34.8 10/22/2016   RDW 14.0 10/22/2016   LYMPHSABS 1.1 08/06/2016   MONOABS 517 07/08/2016   EOSABS 0.1 08/06/2016    BMET Lab Results  Component Value Date   NA 135 07/08/2016   K 3.4 (L) 07/08/2016   CL 102 07/08/2016   CO2 24 07/08/2016   GLUCOSE 60 (L) 07/08/2016   BUN 9 07/08/2016   CREATININE 0.60 07/08/2016   CALCIUM 8.6 07/08/2016   GFRNONAA >89 07/08/2016   GFRAA >89 07/08/2016      Assessment and Plan Sexually active = Will get depo today once pregnancy ruled out  hiv disease, poorly controlled = we have spent majority of this visit talking about adherence counseling, she will see cassie in 2 wk from further management  Will see her back in 5-6 wk  Started on truvada/evotaz at bedtime on 3/19. Will refill meds  Schizophrenia = currently back on her baseline meds, though her affect is slightly inappropriate with laughter when talking about the lack of custody of her infant son. She is concerned that he may have hiv  Illicit drug use = She is planning on stopping drugs/ will check uds

## 2016-11-11 ENCOUNTER — Telehealth: Payer: Self-pay | Admitting: Internal Medicine

## 2016-11-11 LAB — PAIN MGMT, PROFILE 1 W/O CONF, U
AMPHETAMINES: NEGATIVE ng/mL (ref <500)
BENZODIAZEPINES: NEGATIVE ng/mL (ref <100)
Barbiturates: NEGATIVE ng/mL (ref <300)
Cocaine Metabolite: POSITIVE ng/mL — AB (ref <150)
Creatinine: 208 mg/dL (ref >=20.0)
MARIJUANA METABOLITE: POSITIVE ng/mL — AB (ref <20)
Methadone Metabolite: NEGATIVE ng/mL (ref <100)
OXIDANT: NEGATIVE ug/mL (ref <200)
Opiates: NEGATIVE ng/mL (ref <100)
Oxycodone: NEGATIVE ng/mL (ref <100)
Phencyclidine: NEGATIVE ng/mL (ref <25)
pH: 6.94 (ref 4.5–9.0)

## 2016-11-11 NOTE — Telephone Encounter (Signed)
Tamara Briggs is being enrolled with outreach RN case management. I agree with the plan of care that has been outlined by Ambre who will being meeting frequently with Tamara Briggs to help adherence with her HIV regimen.   Patient appears willing to participate    (Routing comment)

## 2016-11-12 ENCOUNTER — Encounter (HOSPITAL_COMMUNITY): Payer: Self-pay | Admitting: *Deleted

## 2016-11-12 ENCOUNTER — Emergency Department (HOSPITAL_COMMUNITY)
Admission: EM | Admit: 2016-11-12 | Discharge: 2016-11-12 | Disposition: A | Discharge status: Home / Self Care | Payer: Medicaid Other | Attending: Emergency Medicine | Admitting: Emergency Medicine

## 2016-11-12 DIAGNOSIS — T43595A Adverse effect of other antipsychotics and neuroleptics, initial encounter: Secondary | ICD-10-CM | POA: Diagnosis not present

## 2016-11-12 DIAGNOSIS — Z79899 Other long term (current) drug therapy: Secondary | ICD-10-CM | POA: Insufficient documentation

## 2016-11-12 DIAGNOSIS — Y69 Unspecified misadventure during surgical and medical care: Secondary | ICD-10-CM | POA: Diagnosis not present

## 2016-11-12 DIAGNOSIS — G2402 Drug induced acute dystonia: Secondary | ICD-10-CM

## 2016-11-12 DIAGNOSIS — R251 Tremor, unspecified: Secondary | ICD-10-CM | POA: Diagnosis present

## 2016-11-12 DIAGNOSIS — G20 Parkinson's disease: Secondary | ICD-10-CM | POA: Diagnosis not present

## 2016-11-12 DIAGNOSIS — F17210 Nicotine dependence, cigarettes, uncomplicated: Secondary | ICD-10-CM | POA: Diagnosis not present

## 2016-11-12 DIAGNOSIS — T887XXA Unspecified adverse effect of drug or medicament, initial encounter: Secondary | ICD-10-CM | POA: Diagnosis not present

## 2016-11-12 MED ORDER — BENZTROPINE MESYLATE 0.5 MG PO TABS: 0.5000 mg | ORAL_TABLET | Freq: Two times a day (BID) | ORAL | 0 refills | Status: DC

## 2016-11-12 MED ORDER — LURASIDONE HCL 20 MG PO TABS: 20.0000 mg | ORAL_TABLET | Freq: Every day | ORAL | 0 refills | Status: DC

## 2016-11-12 MED ORDER — BENZTROPINE MESYLATE 1 MG/ML IJ SOLN
1.0000 mg | Freq: Once | INTRAMUSCULAR | Status: AC
  Administered 2016-11-12: 1 mg via INTRAMUSCULAR
  Filled 2016-11-12: qty 2

## 2016-11-12 NOTE — ED Triage Notes (Signed)
Pt complains of tremors since taking a higher dose of latuda last night. Pt's dose was increased from 20mg  to 40mg  recently, pt took the dose for the first time last night. Pt states she had tremors when she was on latuda initially but they eventually went away.

## 2016-11-12 NOTE — Discharge Instructions (Signed)
Decrease the Latuda back down to 20 mg. Take the Cogentin as needed for the movement issue.

## 2016-11-12 NOTE — ED Provider Notes (Signed)
[facility]-EMERGENCY DEPT Provider Note   CSN: [id number] Arrival date & time: 11/12/16  1546     History   Chief Complaint Chief Complaint  Patient presents with  . Tremors    HPI Tamara Briggs is a [age] y.o. female.  HPI Patient developed severe tremors today. Started back on the tutor recently and last night went from 20 mg to 40 mg. She has had tremors with this in the past. Worse in her arms and legs. Has a history of schizophrenia. Also bipolar disorder. States that she uses crack cocaine and marijuana. Last use around 3 days ago. No headache. No confusion. No muscle pains. States that the 40 mg dose is too much and she liked tremors.. Patient recently had a baby and had been off some of her medicines.   Past Medical History:  Diagnosis Date  . Anxiety   . Bipolar disorder (HCC)   . Crack cocaine use   . Depression   . 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 Infection   . Otitis media of left ear 03/15/2013  . Schizophrenia (HCC)   . 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 Tardive dyskinesia 06/13/2014    Patient Active Problem List   Diagnosis Date Noted  . Normal labor 10/22/2016  . Previous cesarean section complicating pregnancy 08/31/2016  . GBS (group B Streptococcus carrier), +RV culture, currently pregnant 08/12/2016  . Supervision of high-risk pregnancy 08/06/2016  . Late prenatal care 08/06/2016  . 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 Tardive dyskinesia 06/13/2014  . Preterm premature rupture of membranes (PPROM) delivered, current hospitalization 12/14/2013  . 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 HIV risk factors complicating pregnancy 10/18/2013  . Schizophrenia (HCC) 10/18/2013  . Tobacco abuse 03/15/2013  . h/o crack Cocaine abuse 03/15/2013  . HIV (human immunodeficiency virus infection) (HCC) 07/27/2011    Past Surgical History:  Procedure Laterality Date  . CESAREAN SECTION N/A 12/24/2013   Procedure: CESAREAN SECTION;  Surgeon: Lesly DukesKelly  H Leggett, MD;  Location: [facility];  Service: Obstetrics;  Laterality: N/A;  . COLPOSCOPY  2009  . DENTAL RESTORATION/EXTRACTION WITH X-RAY      OB History    Gravida Para Term Preterm AB Living   4 3 1 2 1 2    SAB TAB Ectopic Multiple Live Births   1 0 0 0 3       Home Medications    Prior to Admission medications   Medication Sig Start Date End Date Taking? Authorizing Provider  acetaminophen (TYLENOL) 325 MG tablet Take 2 tablets (650 mg total) by mouth every 4 (four) hours as needed (for pain scale < 4). Patient not taking: Reported on 11/10/2016 10/25/16   Nehemiah SettleAna Carvalho Janine Oreso Amaral, MD  atazanavir-cobicistat (EVOTAZ) 300-150 MG tablet Take 1 tablet by mouth daily. Swallow whole. Do NOT crush, cut or chew tablet. Take with food. 11/10/16   Judyann Munsonynthia Snider, MD  benztropine (COGENTIN) 0.5 MG tablet Take 1 tablet (0.5 mg total) by mouth 2 (two) times daily. 11/12/16   Benjiman CoreNathan Midori Dado, MD  emtricitabine-tenofovir (TRUVADA) 200-300 MG tablet Take 1 tablet by mouth daily. 11/10/16   Judyann Munsonynthia Snider, MD  ENSURE (ENSURE) Take 237 mLs by mouth 2 (two) times daily between meals. Patient not taking: Reported on 09/25/2016 09/17/16   Judyann Munsonynthia Snider, MD  IBU 800 MG tablet Take 800 mg by mouth 2 (two) times daily as needed. 10/05/16   Historical Provider, MD  lurasidone (LATUDA) 20 MG TABS tablet Take 1 tablet (  20 mg total) by mouth daily with breakfast. 11/12/16   Benjiman Core, MD  Prenat-FeCbn-FeAspGl-FA-Omega (OB COMPLETE PETITE) 35-5-1-200 MG CAPS Take 1 tablet by mouth daily. 08/06/16   Rachelle A Denney, CNM  sertraline (ZOLOFT) 50 MG tablet Take 1 tablet (50 mg total) by mouth daily. 08/06/14   Judyann Munson, MD    Family History Family History  Problem Relation Age of Onset  . Diabetes Mother   . High blood pressure Mother   . Cancer Father     Social History Social History  Substance Use Topics  . Smoking status: Current Every Day Smoker    Packs/day: 0.10    Years: 3.00    Types:  Cigarettes  . Smokeless tobacco: Never Used     Comment: cutting back, 1 cigs/day  . Alcohol use No     Allergies   Patient has no known allergies.   Review of Systems Review of Systems  Constitutional: Negative for appetite change.  HENT: Negative for congestion.   Respiratory: Negative for shortness of breath.   Cardiovascular: Negative for chest pain.  Gastrointestinal: Negative for abdominal pain.  Musculoskeletal: Negative for back pain.  Skin: Negative for color change.  Neurological: Positive for tremors. Negative for speech difficulty.  Psychiatric/Behavioral: Negative for agitation.     Physical Exam Updated Vital Signs BP 136/84   Pulse 78   Temp 97.9 F (36.6 C) (Oral)   Resp 18   SpO2 98%   Physical Exam  Constitutional: She is oriented to person, place, and time. She appears well-developed.  HENT:  Head: Atraumatic.  Neck: Neck supple.  Cardiovascular: Normal rate.   Pulmonary/Chest: Effort normal.  Abdominal: Soft.  Neurological: She is alert and oriented to person, place, and time.  Patient with tremor to upper extremities and head. Moderate shaking of both hands. She had difficulty turning off her cell phone due to the tremor.  Skin: Skin is warm.     ED Treatments / Results  Labs (all labs ordered are listed, but only abnormal results are displayed) Labs Reviewed - No data to display  EKG  EKG Interpretation None       Radiology No results found.  Procedures Procedures (including critical care time)  Medications Ordered in ED Medications  benztropine mesylate (COGENTIN) injection 1 mg (1 mg Intramuscular Given 11/12/16 1704)     Initial Impression / Assessment and Plan / ED Course  I have reviewed the triage vital signs and the nursing notes.  Pertinent labs & imaging results that were available during my care of the patient were reviewed by me and considered in my medical decision making (see chart for details).      Patient with tremor/dystonia from recent dose increase of Latuda. Much better after Cogentin given here. Will discharge a few day prescription of Cogentin and decrease the dose back down to be changed by her provider.  Final Clinical Impressions(s) / ED Diagnoses   Final diagnoses:  Dystonic drug reaction    New Prescriptions Current Discharge Medication List       Benjiman Core, MD 11/12/16 [phone]

## 2016-11-12 NOTE — ED Notes (Signed)
ED Provider at bedside. 

## 2016-11-12 NOTE — ED Notes (Signed)
Bed: WA12 Expected date:  Expected time:  Means of arrival:  Comments: 

## 2016-11-23 ENCOUNTER — Ambulatory Visit (INDEPENDENT_AMBULATORY_CARE_PROVIDER_SITE_OTHER): Payer: Medicaid Other | Admitting: Obstetrics & Gynecology

## 2016-11-23 ENCOUNTER — Encounter: Payer: Self-pay | Admitting: Obstetrics & Gynecology

## 2016-11-23 DIAGNOSIS — F53 Postpartum depression: Secondary | ICD-10-CM

## 2016-11-23 DIAGNOSIS — F203 Undifferentiated schizophrenia: Secondary | ICD-10-CM

## 2016-11-23 DIAGNOSIS — O99345 Other mental disorders complicating the puerperium: Secondary | ICD-10-CM

## 2016-11-23 DIAGNOSIS — F3130 Bipolar disorder, current episode depressed, mild or moderate severity, unspecified: Secondary | ICD-10-CM

## 2016-11-23 DIAGNOSIS — F319 Bipolar disorder, unspecified: Secondary | ICD-10-CM | POA: Insufficient documentation

## 2016-11-23 DIAGNOSIS — F1490 Cocaine use, unspecified, uncomplicated: Secondary | ICD-10-CM | POA: Insufficient documentation

## 2016-11-23 NOTE — Progress Notes (Signed)
Post Partum Exam  Tamara Briggs is a 31 y.o. [phone] female who presents for a postpartum visit. She is 5 weeks postpartum following a spontaneous vaginal delivery. I have fully reviewed the prenatal and intrapartum course. The delivery was at 35.4 gestational weeks.  Anesthesia: none. Postpartum course has been complicated by her schizophrenia, bipolar disease, cocaine abuse and CPS taking of the infant. Patient has been followed by [facility] and [facility] services, already on psychotropic medications.  Bleeding moderate lochia in past week, no current bleeding.  Bowel function is normal. Bladder function is normal. Patient is not sexually active. Contraception method is Depo-Provera injections, received at [facility] center last week. Postpartum depression screening:Positive but patient declines any further interventions.  The following portions of the patient's history were reviewed and updated as appropriate: allergies, current medications, past family history, past medical history, past social history, past surgical history and problem list.  Review of Systems Pertinent items noted in HPI and remainder of comprehensive ROS otherwise negative.    Objective:  Blood pressure (!) 120/54, pulse 99, temperature 99.2 F (37.3 C), temperature source Oral, weight 92 lb 4.8 oz (41.9 kg), not currently breastfeeding.  General:  distracted and agitated   Breasts:  deferred  Lungs: clear to auscultation bilaterally  Heart:  regular rate and rhythm  Abdomen: soft, non-tender; bowel sounds normal; no masses,  no organomegaly  Pelvic:  not evaluated        Assessment:   Patient is here for postpartum exam.   Plan:   1. Contraception: Depo-Provera injections, will continue to receive at [facility] 2. Mental health issues: Will defer management to [facility] 3. Follow up as needed for any OB/GYN issues.     Jaynie Collins, MD, FACOG Attending Obstetrician & Gynecologist, [facility] for [employer], [facility]

## 2016-11-26 ENCOUNTER — Ambulatory Visit (INDEPENDENT_AMBULATORY_CARE_PROVIDER_SITE_OTHER): Payer: Medicaid Other | Admitting: Pharmacist

## 2016-11-26 DIAGNOSIS — B20 Human immunodeficiency virus [HIV] disease: Secondary | ICD-10-CM

## 2016-11-26 NOTE — Progress Notes (Signed)
HPI: Jadeyn Hargett is a 31 y.o. female who presents to the [facility] pharmacy clinic today for follow-up of her HIV.  Allergies: No Known Allergies  Past Medical History: Past Medical History:  Diagnosis Date  . Anxiety   . Bipolar disorder (HCC)   . Crack cocaine use   . Depression   . h/o crack Cocaine abuse 03/15/2013   UDS positive cocaine  . 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  AFP increased and sent to genetics.  Amnio declined.  [place] nml.   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 Infection   . Otitis media of left ear 03/15/2013  . Schizophrenia (HCC)   . 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 Tardive dyskinesia 06/13/2014  . Tobacco abuse 03/15/2013    Social History: Social History   Social History  . Marital status: Single    Spouse name: N/A  . Number of children: 1  . Years of education: 11th   Social History Main Topics  . Smoking status: Current Every Day Smoker    Packs/day: 0.10    Years: 3.00    Types: Cigarettes  . Smokeless tobacco: Never Used     Comment: cutting back, 1 cigs/day  . Alcohol use No  . Drug use: Yes    Types: Cocaine, Marijuana     Comment: pt states she used this morning  . Sexual activity: Yes    Partners: Male    Birth control/ protection: None     Comment: declined condoms   Other Topics Concern  . Not on file   Social History Narrative   Patient lives at home with her partner [name].   Unemployed.   Education 11 th grade.   Right handed.   Patient does not drink caffeine.    Current Regimen: Evotaz + Truvada  Labs: HIV 1 RNA Quant (copies/mL)  Date Value  07/08/2016 5,552 (H)  10/31/2015 188 (H)  06/11/2015 <20   CD4 T Cell Abs (/uL)  Date Value  10/25/2016 250 (L)  07/08/2016 410  10/31/2015 600   Hep B S Ab (no units)  Date Value  07/09/2011 POS (A)   Hepatitis B Surface Ag (no units)  Date Value  08/06/2016 Negative   HCV Ab (no units)   Date Value  07/09/2011 NEGATIVE    CrCl: CrCl cannot be calculated (Patient's most recent lab result is older than the maximum 21 days allowed.).  Lipids:    Component Value Date/Time   CHOL 127 10/31/2015 1000   TRIG 97 10/31/2015 1000   HDL 63 10/31/2015 1000   CHOLHDL 2.0 10/31/2015 1000   VLDL 19 10/31/2015 1000   LDLCALC 45 10/31/2015 1000    Assessment: Malynn is here today for HIV follow-up.  She tells me she has been consistently taking her HIV medications, Evotaz + Truvada, since last seen here on 3/20.  She says she has not missed any doses.  I asked her how things were going and she said "ok".  I asked if she is staying away from drug use and she said she is trying to.  I asked when the last time she used was and she said yesterday - cocaine and marijuana.  She is asking for something besides Evotaz as she says it is too big and hard to swallow.  She is unreliable in terms of consistently taking her medications and already has some baseline resistance.  I will not change her medications today and Dr. Drue Second agrees.  I made a f/u appointment for Dr. Drue Second and myself on May 3. Should get labs at that visit.  Plans: - Continue Evotaz and Truvada - F/u with Dr. Drue Second and pharmacy 5/3   Samuel Rittenhouse L. Nzinga Ferran, PharmD, CPP Infectious Diseases Clinical Pharmacist [facility] 11/26/2016, 2:45 PM

## 2016-11-30 ENCOUNTER — Telehealth: Payer: Self-pay | Admitting: *Deleted

## 2016-11-30 NOTE — Telephone Encounter (Signed)
RN contacted Savanna but phone number continues to go straight to voicemail. RN left a message for the patient asking her to return my call or send me a text message

## 2016-12-08 NOTE — Progress Notes (Signed)
RN made a hospital visit with the patient after receiving a call from the patient stating she has delivered her son. RN arrived at the patient's room with a focus on re-addressing her current barriers to care/medication adherence. Once RN arrived at the patient's room she received another visit from CPS stating they will be securing custody of the child unless [name] has someone willing to accept custody of the child and meet requirements. [name] became visibly upset so focus shifted at this time to discussing the need to locate a guardian for her child and coming to grips with the need for her to seek rehabilitation for her drug use if she wishes to regain custody of her child.  [name] asked if I would join her for the custody meeting that will be scheduled for Monday morning at 9am. Recertifcation will be needed at this time

## 2016-12-09 NOTE — Progress Notes (Signed)
RN arrived at [facility] for prearranged visit with [facility]. Staff informed me that Rin has been discharged from [facility] over the weekend and the child is not within the custody of CPS. Unable to reach Saranda by phone at this time

## 2016-12-24 ENCOUNTER — Ambulatory Visit: Payer: Medicaid Other | Admitting: Internal Medicine

## 2016-12-24 ENCOUNTER — Ambulatory Visit: Payer: Medicaid Other

## 2017-01-04 ENCOUNTER — Telehealth: Payer: Self-pay | Admitting: *Deleted

## 2017-01-04 NOTE — Telephone Encounter (Signed)
Patient called very upset about what she says is a medication reaction. She advised she wants to speak with the doctor and needs to be seen as soon as possible. Advised the patient the doctor is in this afternoon and she can see her then. Patient advised she is not able to come to clinic today as she has other appointments. After much back and forth the patient is able to come Wednesday 01/06/17 to see the pharmacy to troubleshoot the issue as the doctor is not in that day. The patient advised she will be here and is ok to see the pharmacy.  Also asked about her Depo and advised her window for the shot is 01/26/17-02/09/17.

## 2017-01-06 ENCOUNTER — Ambulatory Visit: Payer: Medicaid Other

## 2017-01-12 ENCOUNTER — Telehealth: Payer: Self-pay | Admitting: *Deleted

## 2017-01-12 NOTE — Telephone Encounter (Signed)
RN contacted Tarika with a focus on staying connected with the patient. Matsha stated she is currently at [facility] being seen for her medications reaction. Yajayra stated the MD thinks the reaction is related to her schizophrenia medications and she [facility]. Nakayla stated she continues to see her son on Wed and Friday from 10 to 1511 but she has a conflict in her schedule because she has to be at [facility] from 9 to 11 on the same days. She has been contacting her CPS worker to see if the times can be changed

## 2017-01-14 ENCOUNTER — Telehealth: Payer: Self-pay | Admitting: *Deleted

## 2017-01-14 NOTE — Telephone Encounter (Signed)
Contacted Pharmacy to assess patient's medication adherence. Tamara Briggs refills her truvada pretty consistently. Over the last 3 months she has picked up her medications on 03/20  04/16   05/17

## 2017-01-26 ENCOUNTER — Ambulatory Visit: Payer: Medicaid Other | Admitting: Internal Medicine

## 2017-02-01 ENCOUNTER — Telehealth: Payer: Self-pay | Admitting: *Deleted

## 2017-02-01 NOTE — Telephone Encounter (Signed)
PATIENT ON HOLD  Plan of Care orders have expired effective 01/28/17 but GOALS have not been completely meet at this time. I have not been able to connect with Tamara Briggs consistently. Tamara Briggs is pretty consistent with refilling her prescriptions but  I would like to continue to connect with the patient and will be requesting more orders once I perform a visit with her. If I am unable to get in contact with her within the next 30 days I will have to discharge at that time. RN WILL NOT RESUME CARE UNTIL NEW MD ORDERS OBTAINED AND PATIENT ABLE/WILLING TO RE-ENGAGE IN CARE

## 2017-02-11 ENCOUNTER — Telehealth: Payer: Self-pay | Admitting: *Deleted

## 2017-02-11 NOTE — Telephone Encounter (Signed)
Contacted Eura today, she states she is doing well and is thinking about treatment. Currently she stated she needs to find another house because that is the reason why she cannot bring her son home. RN advised Bonni that she may want to also consider some substance abuse treatment to help her with getting custody of her son. Ineze stated she had thought about going back to the "[facility]" program. Asked Billie if she was ready to change and she stated she was. RN plans to print out treatment options for Tawania.

## 2017-02-22 ENCOUNTER — Telehealth: Payer: Self-pay | Admitting: *Deleted

## 2017-02-22 NOTE — Telephone Encounter (Signed)
RN called Tamara Briggs to check in with her and assess her current needs. RN left a message asking for a return call and  informed the patient that  I will be unavailable next week.   In the event that she needs my assistance she has plenty of resources available to her. RN advised the patient that [facility] will continue to provide support to her and can still be reached at [phone]. In the event of a urgent matter in which medication adherence cannot be maintained electronically, Home Care Providers are more than willing to assist. They can be reached at [phone]

## 2017-02-26 ENCOUNTER — Telehealth: Payer: Self-pay | Admitting: *Deleted

## 2017-03-02 ENCOUNTER — Ambulatory Visit (INDEPENDENT_AMBULATORY_CARE_PROVIDER_SITE_OTHER): Payer: Medicaid Other | Admitting: Internal Medicine

## 2017-03-02 ENCOUNTER — Encounter: Payer: Self-pay | Admitting: Internal Medicine

## 2017-03-02 VITALS — BP 99/58 | HR 82 | Temp 99.6°F | Ht 64.0 in | Wt 89.0 lb

## 2017-03-02 DIAGNOSIS — R251 Tremor, unspecified: Secondary | ICD-10-CM | POA: Diagnosis not present

## 2017-03-02 DIAGNOSIS — E876 Hypokalemia: Secondary | ICD-10-CM | POA: Diagnosis present

## 2017-03-02 DIAGNOSIS — B20 Human immunodeficiency virus [HIV] disease: Secondary | ICD-10-CM | POA: Diagnosis not present

## 2017-03-02 DIAGNOSIS — F209 Schizophrenia, unspecified: Secondary | ICD-10-CM

## 2017-03-02 NOTE — Progress Notes (Signed)
RFV: follow up for hiv disease  Patient ID: Tamara Briggs, female   DOB: [birth year], 31 y.o.   MRN: [id number]  HPI  Tamara Briggs is a 31yo F with schizophrenia, and HIV disease, she reports missing days of her hiv medication since she thought it was causing her to be jittery. Sometimes meds cause her to have nausea when she is taking meds in the day time. Has been off of her latuda for 3 months  Soc hx: Lives on [address] staying at friends house.food insecurity   Outpatient Encounter Prescriptions as of 03/02/2017  Medication Sig  . acetaminophen (TYLENOL) 325 MG tablet Take 2 tablets (650 mg total) by mouth every 4 (four) hours as needed (for pain scale < 4). (Patient not taking: Reported on 11/10/2016)  . atazanavir-cobicistat (EVOTAZ) 300-150 MG tablet Take 1 tablet by mouth daily. Swallow whole. Do NOT crush, cut or chew tablet. Take with food. (Patient not taking: Reported on 11/23/2016)  . benztropine (COGENTIN) 0.5 MG tablet Take 1 tablet (0.5 mg total) by mouth 2 (two) times daily. (Patient not taking: Reported on 11/23/2016)  . emtricitabine-tenofovir (TRUVADA) 200-300 MG tablet Take 1 tablet by mouth daily. (Patient not taking: Reported on 11/23/2016)  . ENSURE (ENSURE) Take 237 mLs by mouth 2 (two) times daily between meals. (Patient not taking: Reported on 09/25/2016)  . IBU 800 MG tablet Take 800 mg by mouth 2 (two) times daily as needed.  . lurasidone (LATUDA) 20 MG TABS tablet Take 1 tablet (20 mg total) by mouth daily with breakfast. (Patient not taking: Reported on 03/02/2017)  . Prenat-FeCbn-FeAspGl-FA-Omega (OB COMPLETE PETITE) 35-5-1-200 MG CAPS Take 1 tablet by mouth daily. (Patient not taking: Reported on 03/02/2017)  . sertraline (ZOLOFT) 50 MG tablet Take 1 tablet (50 mg total) by mouth daily. (Patient not taking: Reported on 03/02/2017)   No facility-administered encounter medications on file as of 03/02/2017.      Patient Active Problem List   Diagnosis Date Noted  .  Crack cocaine use   . Bipolar disorder (HCC)   . 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 Tardive dyskinesia 06/13/2014  . Schizophrenia (HCC) 10/18/2013  . HIV (human immunodeficiency virus infection) (HCC) 07/27/2011     There are no preventive care reminders to display for this patient.   Review of Systems Jittery, nausea, paranoia. Otherwise 12 point ros is negative Physical Exam   BP (!) 99/58   Pulse 82   Temp 99.6 F (37.6 C) (Oral)   Ht 5\' 4"  (1.626 m)   Wt 89 lb (40.4 kg)   LMP 01/24/2017 (Exact Date)   BMI 15.28 kg/m   Physical Exam  Constitutional:  oriented to person, place, and time. appears well-developed and well-nourished. No distress.  HENT: /AT, PERRLA, no scleral icterus Mouth/Throat: Oropharynx is clear and moist. No oropharyngeal exudate.  Cardiovascular: Normal rate, regular rhythm and normal heart sounds. Exam reveals no gallop and no friction rub.  No murmur heard.  Pulmonary/Chest: Effort normal and breath sounds normal. No respiratory distress.  has no wheezes.  Neck = supple, no nuchal rigidity Abdominal: Soft. Bowel sounds are normal.  exhibits no distension. There is no tenderness.  Lymphadenopathy: no cervical adenopathy. No axillary adenopathy Neurological: alert and oriented to person, place, and time. akasthisia Skin: Skin is warm and dry. No rash noted. No erythema.  Psychiatric: a normal mood and affect.  behavior is normal.   Lab Results  Component Value Date   CD4TCELL 30 (L) 10/25/2016  Lab Results  Component Value Date   CD4TABS 250 (L) 10/25/2016   CD4TABS 410 07/08/2016   CD4TABS 600 10/31/2015   Lab Results  Component Value Date   HIV1RNAQUANT 5,552 (H) 07/08/2016   Lab Results  Component Value Date   HEPBSAB POS (A) 07/09/2011   Lab Results  Component Value Date   LABRPR Non Reactive 10/22/2016    CBC Lab Results  Component Value Date   WBC 9.2 10/22/2016   RBC 3.84 (L) 10/22/2016   HGB 11.3 (L)  10/22/2016   HCT 32.5 (L) 10/22/2016   PLT 139 (L) 10/22/2016   MCV 84.6 10/22/2016   MCH 29.4 10/22/2016   MCHC 34.8 10/22/2016   RDW 14.0 10/22/2016   LYMPHSABS 1.1 08/06/2016   MONOABS 517 07/08/2016   EOSABS 0.1 08/06/2016    BMET Lab Results  Component Value Date   NA 135 07/08/2016   K 3.4 (L) 07/08/2016   CL 102 07/08/2016   CO2 24 07/08/2016   GLUCOSE 60 (L) 07/08/2016   BUN 9 07/08/2016   CREATININE 0.60 07/08/2016   CALCIUM 8.6 07/08/2016   GFRNONAA >89 07/08/2016   GFRAA >89 07/08/2016      Assessment and Plan  hiv disease = poorly controlled due to non-adherence, likely in part due to her underlying mental illness. We will see if can take daily with premedicating and taking her medications at bedtime. Will see back in 4 wk  Hypokalemia = will check bmp  Extrapyramidal effects = will try cogentin restarting it  Bipolar = to see her pscychiatrist to see if there is a better regimen for her.

## 2017-03-03 LAB — BASIC METABOLIC PANEL WITH GFR
BUN: 12 mg/dL (ref 7–25)
CO2: 23 mmol/L (ref 20–31)
Calcium: 8.9 mg/dL (ref 8.6–10.2)
Chloride: 105 mmol/L (ref 98–110)
Creat: 0.81 mg/dL (ref 0.50–1.10)
Glucose, Bld: 79 mg/dL (ref 65–99)
POTASSIUM: 3.5 mmol/L (ref 3.5–5.3)
SODIUM: 138 mmol/L (ref 135–146)

## 2017-03-24 ENCOUNTER — Telehealth: Payer: Self-pay | Admitting: *Deleted

## 2017-03-24 NOTE — Telephone Encounter (Signed)
Contacted Tamara Briggs today. I would like to assess her needs and if she could use my assistance in any way. Message left asking [name] to return my call

## 2017-03-25 ENCOUNTER — Emergency Department (HOSPITAL_COMMUNITY)
Admission: EM | Admit: 2017-03-25 | Discharge: 2017-03-25 | Disposition: A | Discharge status: Home / Self Care | Payer: Medicaid Other | Attending: Emergency Medicine | Admitting: Emergency Medicine

## 2017-03-25 ENCOUNTER — Encounter (HOSPITAL_COMMUNITY): Payer: Self-pay

## 2017-03-25 DIAGNOSIS — S40862A Insect bite (nonvenomous) of left upper arm, initial encounter: Secondary | ICD-10-CM | POA: Diagnosis not present

## 2017-03-25 DIAGNOSIS — Z79899 Other long term (current) drug therapy: Secondary | ICD-10-CM | POA: Diagnosis not present

## 2017-03-25 DIAGNOSIS — B20 Human immunodeficiency virus [HIV] disease: Secondary | ICD-10-CM | POA: Diagnosis not present

## 2017-03-25 DIAGNOSIS — S40861A Insect bite (nonvenomous) of right upper arm, initial encounter: Secondary | ICD-10-CM | POA: Diagnosis present

## 2017-03-25 DIAGNOSIS — Y999 Unspecified external cause status: Secondary | ICD-10-CM | POA: Insufficient documentation

## 2017-03-25 DIAGNOSIS — B888 Other specified infestations: Secondary | ICD-10-CM | POA: Diagnosis not present

## 2017-03-25 DIAGNOSIS — Y9389 Activity, other specified: Secondary | ICD-10-CM | POA: Diagnosis not present

## 2017-03-25 DIAGNOSIS — F17210 Nicotine dependence, cigarettes, uncomplicated: Secondary | ICD-10-CM | POA: Insufficient documentation

## 2017-03-25 DIAGNOSIS — W57XXXA Bitten or stung by nonvenomous insect and other nonvenomous arthropods, initial encounter: Secondary | ICD-10-CM | POA: Diagnosis not present

## 2017-03-25 DIAGNOSIS — Y929 Unspecified place or not applicable: Secondary | ICD-10-CM | POA: Insufficient documentation

## 2017-03-25 MED ORDER — HYDROCORTISONE 2.5 % EX LOTN: TOPICAL_LOTION | Freq: Two times a day (BID) | CUTANEOUS | 0 refills | Status: DC

## 2017-03-25 NOTE — ED Triage Notes (Signed)
Pt endorses being bit by bed bugs Tuesday at her residence. Pt has bite marks on both arms and torso. No other complaints. No bugs visible on patient. Pt states that her clothes were disposed of. Pt to stay in triage room 4 and provider to come and see.

## 2017-03-25 NOTE — Discharge Instructions (Signed)
Please read attached information regarding your condition. Please wash clothing and bedding in hot water. Consider calling exterminator for further management of bedbugs. Use hydrocortisone cream in areas with itching. Return to ED for fevers, signs of severe allergic reaction, lip swelling, double breathing or trouble swallowing.

## 2017-03-25 NOTE — ED Provider Notes (Signed)
[facility]-EMERGENCY DEPT Provider Note   CSN: [id number] Arrival date & time: 03/25/17  1145     History   Chief Complaint Chief Complaint  Patient presents with  . Insect Bite    HPI Tamara Briggs is a 31 y.o. female.  HPI  Patient presents to ED for evaluation of bedbug bites for the past 3 days. She reports bites on arms and back. She denies any lip swelling, trouble breathing, trouble swallowing, chest pain, fevers, chills, nausea, vomiting. Has not tried any medications for the itching.  Past Medical History:  Diagnosis Date  . Anxiety   . Bipolar disorder (HCC)   . Crack cocaine use   . Depression   . h/o crack Cocaine abuse 03/15/2013   UDS positive cocaine  . 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  AFP increased and sent to genetics.  Amnio declined.  US nml.   Marland Kitchen. 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 Infection   . Otitis media of left ear 03/15/2013  . Schizophrenia (HCC)   . 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 Tardive dyskinesia 06/13/2014  . Tobacco abuse 03/15/2013    Patient Active Problem List   Diagnosis Date Noted  . Crack cocaine use   . Bipolar disorder (HCC)   . 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 Tardive dyskinesia 06/13/2014  . Schizophrenia (HCC) 10/18/2013  . HIV (human immunodeficiency virus infection) (HCC) 07/27/2011    Past Surgical History:  Procedure Laterality Date  . CESAREAN SECTION N/A 12/24/2013   Procedure: CESAREAN SECTION;  Surgeon: Lesly DukesKelly H Leggett, MD;  Location: [facility];  Service: Obstetrics;  Laterality: N/A;  . COLPOSCOPY  2009  . DENTAL RESTORATION/EXTRACTION WITH X-RAY      OB History    Gravida Para Term Preterm AB Living   4 3 1 2 1 2    SAB TAB Ectopic Multiple Live Births   1 0 0 0 3       Home Medications    Prior to Admission medications   Medication Sig Start Date End Date Taking? Authorizing Provider  acetaminophen (TYLENOL) 325 MG tablet  Take 2 tablets (650 mg total) by mouth every 4 (four) hours as needed (for pain scale < 4). Patient not taking: Reported on 11/10/2016 10/25/16   Adair Laundryarvalho Do Amaral, Ana, MD  atazanavir-cobicistat (EVOTAZ) 300-150 MG tablet Take 1 tablet by mouth daily. Swallow whole. Do NOT crush, cut or chew tablet. Take with food. Patient not taking: Reported on 11/23/2016 11/10/16   Judyann MunsonSnider, Cynthia, MD  benztropine (COGENTIN) 0.5 MG tablet Take 1 tablet (0.5 mg total) by mouth 2 (two) times daily. Patient not taking: Reported on 11/23/2016 11/12/16   Benjiman CorePickering, Nathan, MD  emtricitabine-tenofovir (TRUVADA) 200-300 MG tablet Take 1 tablet by mouth daily. Patient not taking: Reported on 11/23/2016 11/10/16   Judyann MunsonSnider, Cynthia, MD  ENSURE (ENSURE) Take 237 mLs by mouth 2 (two) times daily between meals. Patient not taking: Reported on 09/25/2016 09/17/16   Judyann MunsonSnider, Cynthia, MD  hydrocortisone 2.5 % lotion Apply topically 2 (two) times daily. 03/25/17   Mikaelyn Arthurs, PA-C  IBU 800 MG tablet Take 800 mg by mouth 2 (two) times daily as needed. 10/05/16   [provider]  lurasidone (LATUDA) 20 MG TABS tablet Take 1 tablet (20 mg total) by mouth daily with breakfast. Patient not taking: Reported on 03/02/2017 11/12/16   Benjiman CorePickering, Nathan, MD  sertraline (ZOLOFT) 50 MG tablet  Take 1 tablet (50 mg total) by mouth daily. Patient not taking: Reported on 03/02/2017 08/06/14   Judyann MunsonSnider, Cynthia, MD    Family History Family History  Problem Relation Age of Onset  . Diabetes Mother   . High blood pressure Mother   . Cancer Father     Social History Social History  Substance Use Topics  . Smoking status: Current Every Day Smoker    Packs/day: 0.10    Years: 3.00    Types: Cigarettes  . Smokeless tobacco: Never Used     Comment: cutting back, 1 cigs/day  . Alcohol use No     Allergies   Patient has no known allergies.   Review of Systems Review of Systems  Constitutional: Negative for chills, fatigue and fever.    HENT: Negative for facial swelling and trouble swallowing.   Respiratory: Negative for choking and shortness of breath.   Musculoskeletal: Negative for arthralgias, joint swelling and myalgias.  Skin: Positive for rash. Negative for color change.     Physical Exam Updated Vital Signs BP 108/77 (BP Location: Right Arm)   Pulse 96   Temp 98.7 F (37.1 C) (Oral)   Resp 18   Ht 5\' 4"  (1.626 m)   Wt 40.8 kg (90 lb)   SpO2 98%   Breastfeeding? No   BMI 15.45 kg/m   Physical Exam  Constitutional: She appears well-developed and well-nourished. No distress.  HENT:  Head: Normocephalic and atraumatic.  Eyes: Conjunctivae and EOM are normal. No scleral icterus.  Neck: Normal range of motion.  Pulmonary/Chest: Effort normal. No respiratory distress.  Neurological: She is alert.  Skin: Rash noted. She is not diaphoretic.  Several small erythematous bumps noted on bilateral wrists and lower back.  Psychiatric: She has a normal mood and affect.  Nursing note and vitals reviewed.    ED Treatments / Results  Labs (all labs ordered are listed, but only abnormal results are displayed) Labs Reviewed - No data to display  EKG  EKG Interpretation None       Radiology No results found.  Procedures Procedures (including critical care time)  Medications Ordered in ED Medications - No data to display   Initial Impression / Assessment and Plan / ED Course  I have reviewed the triage vital signs and the nursing notes.  Pertinent labs & imaging results that were available during my care of the patient were reviewed by me and considered in my medical decision making (see chart for details).    Patient presents to ED for evaluation of bedbug bites from home. She has several small erythematous bumps noted to bilateral wrists and lower back. Pt has a patent airway without stridor and is handling secretions without difficulty; no angioedema. No blisters, no pustules, no warmth, no  draining sinus tracts, no superficial abscesses, no bullous impetigo, no vesicles, no desquamation, no target lesions with dusky purpura or a central bulla. Not tender to touch. No concern for superimposed infection. No concern for SJS, TEN, TSS, tick borne illness, syphilis or other life-threatening condition.  She denies any fever, chills, nausea, vomiting, signs of severe allergic reaction. She is afebrile with no history of fever. She is nontoxic appearing and appears comfortable in the triage room. We will give hydrocortisone cream for affected areas and advise patient to wash bedding and clothing in hot water. Handouts given regarding bedbug care. Patient appears stable for discharge at this time. Strict return precautions given.  Final Clinical Impressions(s) / ED Diagnoses  Final diagnoses:  Infestation by bed bug    New Prescriptions Discharge Medication List as of 03/25/2017 12:22 PM    START taking these medications   Details  hydrocortisone 2.5 % lotion Apply topically 2 (two) times daily., Starting Thu 03/25/2017, Print         Idelle Leech, [place], PA-C 03/25/17 1311    Cathren Laine, MD 03/25/17 1314

## 2017-04-06 ENCOUNTER — Telehealth: Payer: Self-pay | Admitting: *Deleted

## 2017-04-06 ENCOUNTER — Ambulatory Visit (INDEPENDENT_AMBULATORY_CARE_PROVIDER_SITE_OTHER): Payer: Medicaid Other | Admitting: Internal Medicine

## 2017-04-06 ENCOUNTER — Encounter: Payer: Self-pay | Admitting: Internal Medicine

## 2017-04-06 VITALS — BP 104/65 | HR 99 | Temp 98.0°F | Wt 89.5 lb

## 2017-04-06 DIAGNOSIS — E43 Unspecified severe protein-calorie malnutrition: Secondary | ICD-10-CM

## 2017-04-06 DIAGNOSIS — B20 Human immunodeficiency virus [HIV] disease: Secondary | ICD-10-CM

## 2017-04-06 DIAGNOSIS — F209 Schizophrenia, unspecified: Secondary | ICD-10-CM | POA: Diagnosis not present

## 2017-04-06 LAB — CBC WITH DIFFERENTIAL/PLATELET
Basophils Absolute: 36 cells/uL (ref 0–200)
Basophils Relative: 1 %
EOS PCT: 4 %
Eosinophils Absolute: 144 cells/uL (ref 15–500)
HCT: 38.9 % (ref 35.0–45.0)
Hemoglobin: 12.7 g/dL (ref 11.7–15.5)
LYMPHS PCT: 34 %
Lymphs Abs: 1224 cells/uL (ref 850–3900)
MCH: 27.6 pg (ref 27.0–33.0)
MCHC: 32.6 g/dL (ref 32.0–36.0)
MCV: 84.6 fL (ref 80.0–100.0)
MPV: 8.9 fL (ref 7.5–12.5)
Monocytes Absolute: 432 cells/uL (ref 200–950)
Monocytes Relative: 12 %
NEUTROS PCT: 49 %
Neutro Abs: 1764 cells/uL (ref 1500–7800)
PLATELETS: 174 10*3/uL (ref 140–400)
RBC: 4.6 MIL/uL (ref 3.80–5.10)
RDW: 15.9 % — AB (ref 11.0–15.0)
WBC: 3.6 10*3/uL — AB (ref 3.8–10.8)

## 2017-04-06 NOTE — Telephone Encounter (Signed)
Called pharmacy at patient's behalf, requested that medications be dispensed for delivery. Pharmacist stated they would bubble pack the medications, would deliver tomorrow once they received the shipment of Evotaz. Per pharmacy, patient refused delivery last week. They will try again. THP made aware. Andree CossHowell, Michelle M, RN

## 2017-04-06 NOTE — Progress Notes (Signed)
RFV: hiv disease  Patient ID: Tamara Briggs, female   DOB: 1986/03/31, 31 y.o.   MRN: [id number]  HPI Tamara Briggs is a 31yo F with poorly controlled HIV disease, schizophrenia, illicit drug use in substance abuse treatment/housing program. She is here in clinic stating that she had taken her hiv meds up until 5 days ago when she has been locked out of her apartment. She is also off of her mental health meds. At this visit, she is tearful but also having outburst to staff and someone on the phone.  Outpatient Encounter Prescriptions as of 04/06/2017  Medication Sig  . acetaminophen (TYLENOL) 325 MG tablet Take 2 tablets (650 mg total) by mouth every 4 (four) hours as needed (for pain scale < 4). (Patient not taking: Reported on 04/06/2017)  . atazanavir-cobicistat (EVOTAZ) 300-150 MG tablet Take 1 tablet by mouth daily. Swallow whole. Do NOT crush, cut or chew tablet. Take with food. (Patient not taking: Reported on 04/06/2017)  . benztropine (COGENTIN) 0.5 MG tablet Take 1 tablet (0.5 mg total) by mouth 2 (two) times daily. (Patient not taking: Reported on 04/06/2017)  . emtricitabine-tenofovir (TRUVADA) 200-300 MG tablet Take 1 tablet by mouth daily. (Patient not taking: Reported on 04/06/2017)  . ENSURE (ENSURE) Take 237 mLs by mouth 2 (two) times daily between meals. (Patient not taking: Reported on 04/06/2017)  . hydrocortisone 2.5 % lotion Apply topically 2 (two) times daily. (Patient not taking: Reported on 04/06/2017)  . IBU 800 MG tablet Take 800 mg by mouth 2 (two) times daily as needed.  . lurasidone (LATUDA) 20 MG TABS tablet Take 1 tablet (20 mg total) by mouth daily with breakfast. (Patient not taking: Reported on 04/06/2017)  . sertraline (ZOLOFT) 50 MG tablet Take 1 tablet (50 mg total) by mouth daily. (Patient not taking: Reported on 04/06/2017)   No facility-administered encounter medications on file as of 04/06/2017.      Patient Active Problem List   Diagnosis Date Noted  . Crack  cocaine use   . Bipolar disorder (HCC)   . 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 Tardive dyskinesia 06/13/2014  . Schizophrenia (HCC) 10/18/2013  . HIV (human immunodeficiency virus infection) (HCC) 07/27/2011     Health Maintenance Due  Topic Date Due  . INFLUENZA VACCINE  03/24/2017     Review of Systems 12 point ros is negative, except for fatigue, hunger, poor sleep   Physical Exam   BP 104/65   Pulse 99   Temp 98 F (36.7 C) (Oral)   Wt 89 lb 8 oz (40.6 kg)   BMI 15.36 kg/m   Physical Exam  Constitutional:  oriented to person, place, and time. appears chronically ill, disheveled. No distress. emaciated HENT: [place]/AT, PERRLA, no scleral icterus Mouth/Throat: Oropharynx is clear and moist. No oropharyngeal exudate.  Cardiovascular: Normal rate, regular rhythm and normal heart sounds. Exam reveals no gallop and no friction rub.  No murmur heard.  Pulmonary/Chest: Effort normal and breath sounds normal. No respiratory distress.  has no wheezes.  Neck = supple, no nuchal rigidity Lymphadenopathy: no cervical adenopathy. No axillary adenopathy Neurological: alert and oriented to person, place, and time.  Skin: Skin is warm and dry. No rash noted. No erythema.  Psychiatric: appears under the influence of substance, not making eye contact and tangential in speech.   Lab Results  Component Value Date   CD4TCELL 30 (L) 10/25/2016   Lab Results  Component Value Date   CD4TABS 250 (L) 10/25/2016  CD4TABS 410 07/08/2016   CD4TABS 600 10/31/2015   Lab Results  Component Value Date   HIV1RNAQUANT 5,552 (H) 07/08/2016   Lab Results  Component Value Date   HEPBSAB POS (A) 07/09/2011   Lab Results  Component Value Date   LABRPR Non Reactive 10/22/2016    CBC Lab Results  Component Value Date   WBC 9.2 10/22/2016   RBC 3.84 (L) 10/22/2016   HGB 11.3 (L) 10/22/2016   HCT 32.5 (L) 10/22/2016   PLT 139 (L) 10/22/2016   MCV 84.6 10/22/2016   MCH 29.4  10/22/2016   MCHC 34.8 10/22/2016   RDW 14.0 10/22/2016   LYMPHSABS 1.1 08/06/2016   MONOABS 517 07/08/2016   EOSABS 0.1 08/06/2016    BMET Lab Results  Component Value Date   NA 138 03/02/2017   K 3.5 03/02/2017   CL 105 03/02/2017   CO2 23 03/02/2017   GLUCOSE 79 03/02/2017   BUN 12 03/02/2017   CREATININE 0.81 03/02/2017   CALCIUM 8.9 03/02/2017   GFRNONAA >89 03/02/2017   GFRAA >89 03/02/2017      Assessment and Plan  hiv disease = will check cd 4 count and viral load to see if any improvement vs. If she needs oi proph. Spent time discussing importance of adherence. Asked if she wanted bridge counseling again, but she declined  Schizophrenia = asked her to get back on medications since it is important for her health  Recommended that she abstains from sex, always uses condoms since she has detectable viremia  Food insecurity/severe protein calorie malnutrition = gave some ensure

## 2017-04-07 LAB — COMPLETE METABOLIC PANEL WITH GFR
ALT: 14 U/L (ref 6–29)
AST: 21 U/L (ref 10–30)
Albumin: 4.3 g/dL (ref 3.6–5.1)
Alkaline Phosphatase: 81 U/L (ref 33–115)
BUN: 12 mg/dL (ref 7–25)
CHLORIDE: 105 mmol/L (ref 98–110)
CO2: 21 mmol/L (ref 20–32)
Calcium: 8.7 mg/dL (ref 8.6–10.2)
Creat: 0.75 mg/dL (ref 0.50–1.10)
GFR, Est African American: >89 mL/min (ref >=60)
GFR, Est Non African American: >89 mL/min (ref >=60)
GLUCOSE: 79 mg/dL (ref 65–99)
POTASSIUM: 3.9 mmol/L (ref 3.5–5.3)
SODIUM: 138 mmol/L (ref 135–146)
Total Bilirubin: 0.2 mg/dL (ref 0.2–1.2)
Total Protein: 8.5 g/dL — ABNORMAL HIGH (ref 6.1–8.1)

## 2017-04-07 LAB — RPR

## 2017-04-07 LAB — T-HELPER CELL (CD4) - (RCID CLINIC ONLY)
CD4 % Helper T Cell: 32 % — ABNORMAL LOW (ref 33–55)
CD4 T Cell Abs: 370 /uL — ABNORMAL LOW (ref 400–2700)

## 2017-04-08 LAB — HIV-1 RNA QUANT-NO REFLEX-BLD
HIV 1 RNA QUANT: 97500 {copies}/mL — AB
HIV-1 RNA Quant, Log: 4.99 Log copies/mL — ABNORMAL HIGH

## 2017-04-16 ENCOUNTER — Encounter: Payer: Self-pay | Admitting: Internal Medicine

## 2017-04-30 ENCOUNTER — Other Ambulatory Visit: Payer: Self-pay | Admitting: *Deleted

## 2017-04-30 MED ORDER — ENSURE PO LIQD: 237.0000 mL | Freq: Two times a day (BID) | ORAL | 11 refills | Status: DC

## 2017-05-04 ENCOUNTER — Ambulatory Visit: Payer: Medicaid Other | Admitting: Internal Medicine

## 2017-05-13 ENCOUNTER — Ambulatory Visit (INDEPENDENT_AMBULATORY_CARE_PROVIDER_SITE_OTHER): Payer: Medicaid Other | Admitting: Internal Medicine

## 2017-05-13 ENCOUNTER — Encounter: Payer: Self-pay | Admitting: Internal Medicine

## 2017-05-13 VITALS — BP 102/57 | HR 63 | Temp 98.7°F | Ht 64.0 in | Wt 91.0 lb

## 2017-05-13 DIAGNOSIS — Z308 Encounter for other contraceptive management: Secondary | ICD-10-CM

## 2017-05-13 DIAGNOSIS — B20 Human immunodeficiency virus [HIV] disease: Secondary | ICD-10-CM

## 2017-05-13 DIAGNOSIS — Z3042 Encounter for surveillance of injectable contraceptive: Secondary | ICD-10-CM

## 2017-05-13 DIAGNOSIS — F209 Schizophrenia, unspecified: Secondary | ICD-10-CM

## 2017-05-13 LAB — COMPLETE METABOLIC PANEL WITH GFR
AG Ratio: 1 (calc) (ref 1.0–2.5)
ALBUMIN MSPROF: 4.2 g/dL (ref 3.6–5.1)
ALT: 11 U/L (ref 6–29)
AST: 19 U/L (ref 10–30)
Alkaline phosphatase (APISO): 55 U/L (ref 33–115)
BILIRUBIN TOTAL: 0.2 mg/dL (ref 0.2–1.2)
BUN: 10 mg/dL (ref 7–25)
CALCIUM: 8.7 mg/dL (ref 8.6–10.2)
CHLORIDE: 108 mmol/L (ref 98–110)
CO2: 26 mmol/L (ref 20–32)
CREATININE: 0.85 mg/dL (ref 0.50–1.10)
GFR, EST AFRICAN AMERICAN: 105 mL/min/{1.73_m2} (ref >=60)
GFR, EST NON AFRICAN AMERICAN: 91 mL/min/{1.73_m2} (ref >=60)
GLUCOSE: 59 mg/dL — AB (ref 65–99)
Globulin: 4.1 g/dL (calc) — ABNORMAL HIGH (ref 1.9–3.7)
Potassium: 3.3 mmol/L — ABNORMAL LOW (ref 3.5–5.3)
Sodium: 139 mmol/L (ref 135–146)
TOTAL PROTEIN: 8.3 g/dL — AB (ref 6.1–8.1)

## 2017-05-13 LAB — CBC WITH DIFFERENTIAL/PLATELET
BASOS ABS: 19 {cells}/uL (ref 0–200)
BASOS PCT: 0.5 %
EOS ABS: 170 {cells}/uL (ref 15–500)
EOS PCT: 4.6 %
HCT: 36.9 % (ref 35.0–45.0)
HEMOGLOBIN: 12 g/dL (ref 11.7–15.5)
Lymphs Abs: 1621 cells/uL (ref 850–3900)
MCH: 26.8 pg — AB (ref 27.0–33.0)
MCHC: 32.5 g/dL (ref 32.0–36.0)
MCV: 82.6 fL (ref 80.0–100.0)
MPV: 9.4 fL (ref 7.5–12.5)
Monocytes Relative: 11.4 %
NEUTROS ABS: 1469 {cells}/uL — AB (ref 1500–7800)
Neutrophils Relative %: 39.7 %
Platelets: 197 10*3/uL (ref 140–400)
RBC: 4.47 10*6/uL (ref 3.80–5.10)
RDW: 14.4 % (ref 11.0–15.0)
Total Lymphocyte: 43.8 %
WBC mixed population: 422 cells/uL (ref 200–950)
WBC: 3.7 10*3/uL — ABNORMAL LOW (ref 3.8–10.8)

## 2017-05-13 MED ORDER — MEDROXYPROGESTERONE ACETATE 150 MG/ML IM SUSP
150.0000 mg | Freq: Once | INTRAMUSCULAR | Status: AC
  Administered 2017-05-13: 150 mg via INTRAMUSCULAR

## 2017-05-13 NOTE — Progress Notes (Signed)
RFV: follow up for hiv disease  Patient ID: Tamara Briggs, female   DOB: 26-Feb-1986, 31 y.o.   MRN: [id number]  HPI  31yo F with poorly controlled hiv disease, and schizophrenia. VL 97,500! Currently on truvada/evotaz. Attempted to have pharmacy bubble-pack her meds. When we last saw her, she was being discontinued from her I/p substance abuse treatment program. She is currently staying with her stepfather, who have extra bedroom, appears to be stable for now. He reminds her to take her meds. She reports that she is taking her meds but is probably missing 1-2 doses per week   Outpatient Encounter Prescriptions as of 05/13/2017  Medication Sig  . acetaminophen (TYLENOL) 325 MG tablet Take 2 tablets (650 mg total) by mouth every 4 (four) hours as needed (for pain scale < 4). (Patient not taking: Reported on 04/06/2017)  . atazanavir-cobicistat (EVOTAZ) 300-150 MG tablet Take 1 tablet by mouth daily. Swallow whole. Do NOT crush, cut or chew tablet. Take with food. (Patient not taking: Reported on 04/06/2017)  . benztropine (COGENTIN) 0.5 MG tablet Take 1 tablet (0.5 mg total) by mouth 2 (two) times daily. (Patient not taking: Reported on 04/06/2017)  . emtricitabine-tenofovir (TRUVADA) 200-300 MG tablet Take 1 tablet by mouth daily. (Patient not taking: Reported on 04/06/2017)  . ENSURE (ENSURE) Take 237 mLs by mouth 2 (two) times daily between meals.  . hydrocortisone 2.5 % lotion Apply topically 2 (two) times daily. (Patient not taking: Reported on 04/06/2017)  . IBU 800 MG tablet Take 800 mg by mouth 2 (two) times daily as needed.  . lurasidone (LATUDA) 20 MG TABS tablet Take 1 tablet (20 mg total) by mouth daily with breakfast. (Patient not taking: Reported on 04/06/2017)  . sertraline (ZOLOFT) 50 MG tablet Take 1 tablet (50 mg total) by mouth daily. (Patient not taking: Reported on 04/06/2017)   No facility-administered encounter medications on file as of 05/13/2017.      Patient Active Problem  List   Diagnosis Date Noted  . Crack cocaine use   . Bipolar disorder (HCC)   . 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 Tardive dyskinesia 06/13/2014  . Schizophrenia (HCC) 10/18/2013  . HIV (human immunodeficiency virus infection) (HCC) 07/27/2011     Health Maintenance Due  Topic Date Due  . INFLUENZA VACCINE  03/24/2017     Review of Systems occ pruritic eruptions to arms but now improved. 12 point ros is otherwise negative Physical Exam  There were no vitals taken for this visit. Physical Exam  Constitutional:  oriented to person, place, and time. appears well-developed and well-nourished. No distress.  HENT: [place]/AT, PERRLA, no scleral icterus Mouth/Throat: Oropharynx is clear and moist. No oropharyngeal exudate.  Cardiovascular: Normal rate, regular rhythm and normal heart sounds. Exam reveals no gallop and no friction rub.  No murmur heard.  Pulmonary/Chest: Effort normal and breath sounds normal. No respiratory distress.  has no wheezes.  Neck = supple, no nuchal rigidity Abdominal: Soft. Bowel sounds are normal.  exhibits no distension. There is no tenderness.  Lymphadenopathy: no cervical adenopathy. No axillary adenopathy Neurological: alert and oriented to person, place, and time.  Skin: Skin is warm and dry. No rash noted. No erythema.  Psychiatric: a normal mood and affect.  behavior is normal.    Lab Results  Component Value Date   CD4TCELL 32 (L) 04/06/2017   Lab Results  Component Value Date   CD4TABS 370 (L) 04/06/2017   CD4TABS 250 (L) 10/25/2016   CD4TABS 410  07/08/2016   Lab Results  Component Value Date   HIV1RNAQUANT 97,500 (H) 04/06/2017   Lab Results  Component Value Date   HEPBSAB POS (A) 07/09/2011   Lab Results  Component Value Date   LABRPR NON REAC 04/06/2017    CBC Lab Results  Component Value Date   WBC 3.6 (L) 04/06/2017   RBC 4.60 04/06/2017   HGB 12.7 04/06/2017   HCT 38.9 04/06/2017   PLT 174 04/06/2017   MCV  84.6 04/06/2017   MCH 27.6 04/06/2017   MCHC 32.6 04/06/2017   RDW 15.9 (H) 04/06/2017   LYMPHSABS 1,224 04/06/2017   MONOABS 432 04/06/2017   EOSABS 144 04/06/2017    BMET Lab Results  Component Value Date   NA 138 04/06/2017   K 3.9 04/06/2017   CL 105 04/06/2017   CO2 21 04/06/2017   GLUCOSE 79 04/06/2017   BUN 12 04/06/2017   CREATININE 0.75 04/06/2017   CALCIUM 8.7 04/06/2017   GFRNONAA >89 04/06/2017   GFRAA >89 04/06/2017      Assessment and Plan  hiv disease = will check labs to see if she is taking her meds and hopefully decrease in her VL  Health maintenance =will give flu vaccine  Pregnancy prevention = will give depo  Depression = continue on current mental health meds

## 2017-05-14 LAB — T-HELPER CELL (CD4) - (RCID CLINIC ONLY)
CD4 T CELL HELPER: 32 % — AB (ref 33–55)
CD4 T Cell Abs: 540 /uL (ref 400–2700)

## 2017-05-16 MED ORDER — MEDROXYPROGESTERONE ACETATE 150 MG/ML IM SUSP: 150.0000 mg | Freq: Once | INTRAMUSCULAR | Status: DC

## 2017-05-17 LAB — HIV-1 RNA QUANT-NO REFLEX-BLD
HIV 1 RNA QUANT: 83900 {copies}/mL — AB
HIV-1 RNA QUANT, LOG: 4.92 {Log_copies}/mL — AB

## 2017-05-19 ENCOUNTER — Telehealth: Payer: Self-pay | Admitting: *Deleted

## 2017-05-19 NOTE — Telephone Encounter (Signed)
Contacted Tamara Briggs today in hopes that  I can offer assistance with her medications adherence. Tamara Briggs stated she cannot talk right now because she is trying to see her son. I know this is very important to [facility] so I verbalized understanding and asked her to give me a call when she has a chance.

## 2017-06-02 NOTE — Progress Notes (Deleted)
[facility]  PATIENT: Tamara Briggs DOB: 02/04/1986   REASON FOR VISIT: Follow-up for abnormality of gait, tardive dyskinesia secondary parkinsonism HISTORY FROM: Patient    HISTORY OF PRESENT ILLNESS:Tamara Briggs is a 31 year old right-handed black female with a history of secondary parkinsonism. 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 This is felt to be due to [facility] which had been discontinued . She has responded well to Cogentin .The patient comes in today indicating that her walking is better no falls since last seen. She ambulates without assistive device.  She comes to this office for further evaluation and refills. She had an appointment yesterday but was 35 minutes late, she was 20 minutes late today.  REVIEW OF SYSTEMS: Full 14 system review of systems performed and notable only for those listed, all others are neg:  Constitutional: neg  Cardiovascular: neg Ear/Nose/Throat: neg  Skin: neg Eyes: Blurred vision and light sensitivity  Respiratory: neg Gastroitestinal: neg  Hematology/Lymphatic: neg  Endocrine: neg Musculoskeletal:gait abnormality Allergy/Immunology: neg Neurological: neg Psychiatric: Schizophrenia  Sleep : neg   ALLERGIES: No Known Allergies  HOME MEDICATIONS: Outpatient Medications Prior to Visit  Medication Sig Dispense Refill  . acetaminophen (TYLENOL) 325 MG tablet Take 2 tablets (650 mg total) by mouth every 4 (four) hours as needed (for pain scale < 4). (Patient not taking: Reported on 04/06/2017) 30 tablet 0  . atazanavir-cobicistat (EVOTAZ) 300-150 MG tablet Take 1 tablet by mouth daily. Swallow whole. Do NOT crush, cut or chew tablet. Take with food. (Patient not taking: Reported on 04/06/2017) 30 tablet 11  . benztropine (COGENTIN) 0.5 MG tablet Take 1 tablet (0.5 mg total) by mouth 2 (two) times daily. (Patient not taking: Reported on 04/06/2017) 6 tablet 0  .  emtricitabine-tenofovir (TRUVADA) 200-300 MG tablet Take 1 tablet by mouth daily. (Patient not taking: Reported on 04/06/2017) 30 tablet 11  . ENSURE (ENSURE) Take 237 mLs by mouth 2 (two) times daily between meals. 237 mL 11  . hydrocortisone 2.5 % lotion Apply topically 2 (two) times daily. (Patient not taking: Reported on 04/06/2017) 59 mL 0  . IBU 800 MG tablet Take 800 mg by mouth 2 (two) times daily as needed.  5  . lurasidone (LATUDA) 20 MG TABS tablet Take 1 tablet (20 mg total) by mouth daily with breakfast. (Patient not taking: Reported on 04/06/2017) 30 tablet 0  . megestrol (MEGACE) 40 MG tablet Take 40 mg by mouth daily.  2  . methocarbamol (ROBAXIN) 500 MG tablet take 1 BY MOUTH TWICE DAILY AS NEEDED  5  . sertraline (ZOLOFT) 50 MG tablet Take 1 tablet (50 mg total) by mouth daily. (Patient not taking: Reported on 04/06/2017) 30 tablet 5   Facility-Administered Medications Prior to Visit  Medication Dose Route Frequency Provider Last Rate Last Dose  . medroxyPROGESTERone (DEPO-PROVERA) injection 150 mg  150 mg Intramuscular Once Judyann Munson, MD        PAST MEDICAL HISTORY: Past Medical History:  Diagnosis Date  . Anxiety   . Bipolar disorder (HCC)   . Crack cocaine use   . Depression   . h/o crack Cocaine abuse 03/15/2013   UDS positive cocaine  . 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  AFP increased and sent to genetics.  Amnio declined.  [place] nml.   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 Infection   . Otitis media of left  ear 03/15/2013  . Schizophrenia (HCC)   . 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 Tardive dyskinesia 06/13/2014  . Tobacco abuse 03/15/2013    PAST SURGICAL HISTORY: Past Surgical History:  Procedure Laterality Date  . CESAREAN SECTION N/A 12/24/2013   Procedure: CESAREAN SECTION;  Surgeon: Lesly Dukes, MD;  Location: [facility];  Service: Obstetrics;  Laterality: N/A;  . COLPOSCOPY  2009  .  DENTAL RESTORATION/EXTRACTION WITH X-RAY      FAMILY HISTORY: Family History  Problem Relation Age of Onset  . Diabetes Mother   . High blood pressure Mother   . Cancer Father     SOCIAL HISTORY: Social History   Social History  . Marital status: Single    Spouse name: N/A  . Number of children: 1  . Years of education: 11th   Occupational History  . Not on file.   Social History Main Topics  . Smoking status: Current Every Day Smoker    Packs/day: 0.10    Years: 3.00    Types: Cigarettes  . Smokeless tobacco: Never Used     Comment: cutting back, 1 cigs/day  . Alcohol use No  . Drug use: Yes    Types: Cocaine, Marijuana     Comment: pt states she used this morning  . Sexual activity: Yes    Partners: Male    Birth control/ protection: None     Comment: givne condoms   Other Topics Concern  . Not on file   Social History Narrative   Patient lives at home with her partner Tamara Briggs.   Unemployed.   Education 11 th grade.   Right handed.   Patient does not drink caffeine.     PHYSICAL EXAM  There were no vitals filed for this visit. There is no height or weight on file to calculate BMI. General: The patient is alert and cooperative at the time of the examination. Skin: No significant peripheral edema is noted.  Neurologic Exam Mental status: The patient is alert and oriented x 3 at the time of the examination. The patient has apparent normal recent and remote memory, with an apparently normal attention span and concentration ability.  Cranial nerves: Facial symmetry is present. Speech is normal, no aphasia or dysarthria is noted. PERL.Extraocular movements are full. Visual fields are full. Motor: The patient has good strength in all 4 extremities. Sensory examination: Soft touch sensation is symmetric on the face, arms, and legs. Coordination: The patient has good finger-nose-finger and heel-to-shin bilaterally. Mild choreoathetoid movements are noted   in both upper extremities. Gait and station: The patient has a slightly wide-based gait. Tandem gait is mildly unsteady. Romberg is negative. No assistive device Reflexes: Deep tendon reflexes are symmetric upper and lower. DIAGNOSTIC DATA (LABS, IMAGING, TESTING) - I reviewed patient records, labs, notes, testing and imaging myself where available.  Lab Results  Component Value Date   WBC 3.7 (L) 05/13/2017   HGB 12.0 05/13/2017   HCT 36.9 05/13/2017   MCV 82.6 05/13/2017   PLT 197 05/13/2017      Component Value Date/Time   NA 139 05/13/2017 1708   K 3.3 (L) 05/13/2017 1708   CL 108 05/13/2017 1708   CO2 26 05/13/2017 1708   GLUCOSE 59 (L) 05/13/2017 1708   BUN 10 05/13/2017 1708   CREATININE 0.85 05/13/2017 1708   CALCIUM 8.7 05/13/2017 1708   PROT 8.3 (H) 05/13/2017 1708   ALBUMIN 4.3 04/06/2017 1153   AST 19 05/13/2017 1708  ALT 11 05/13/2017 1708   ALKPHOS 81 04/06/2017 1153   BILITOT 0.2 05/13/2017 1708   GFRNONAA 91 05/13/2017 1708   GFRAA 105 05/13/2017 1708   Lab Results  Component Value Date   CHOL 127 10/31/2015   HDL 63 10/31/2015   LDLCALC 45 10/31/2015   TRIG 97 10/31/2015   CHOLHDL 2.0 10/31/2015    ASSESSMENT AND PLAN 31 y.o. 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 Here to follow up.  Continue Cogentin will refill Follow-up yearly and when necessary Please be on time for future appointments or you may have to  reschedule Nilda Riggs, [facility], [facility], APRN  [facility] [address] [place], [place] [address] [phone]

## 2017-06-03 ENCOUNTER — Ambulatory Visit: Payer: Medicaid Other | Admitting: Nurse Practitioner

## 2017-06-07 ENCOUNTER — Encounter: Payer: Self-pay | Admitting: Nurse Practitioner

## 2017-06-09 NOTE — Progress Notes (Signed)
[facility]  PATIENT: Tamara Briggs DOB: 26-May-1986   REASON FOR VISIT: Follow-up for abnormality of gait, tardive dyskinesia secondary parkinsonism HISTORY FROM: Patient    HISTORY OF PRESENT ILLNESS:Tamara Briggs is a 31 year old right-handed black female with a history of secondary parkinsonism. 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 This is felt to be due to [facility] which had been discontinued when last seen but restarted at a lower dose.  . She has responded well to Cogentin in the past .  She was seen in the emergency room in March for severe tremors after she had had some adjustments to her bipolar medications and schizophrenia. At that time she was using crack cocaine and marijuana She was given an injection of Cogentin but  the medication has been gradually tapered The patient comes in today indicating that her walking is better no falls since last seen. She ambulates without assistive device. Her tremors are better. She is not on Cogentin at present. She is on Megace in  an attempt to gain weight  She comes to this office for further evaluation  REVIEW OF SYSTEMS: Full 14 system review of systems performed and notable only for those listed, all others are neg:  Constitutional: Weight loss  Cardiovascular: neg Ear/Nose/Throat: neg  Skin: neg Eyes: Blurred vision and light sensitivity  Respiratory: neg Gastroitestinal: neg  Hematology/Lymphatic: neg  Endocrine: neg Musculoskeletal:neg Allergy/Immunology: neg Neurological: Rare tremor Psychiatric: Schizophrenia , depression and anxiety Sleep : neg   ALLERGIES: No Known Allergies  HOME MEDICATIONS: Outpatient Medications Prior to Visit  Medication Sig Dispense Refill  . atazanavir-cobicistat (EVOTAZ) 300-150 MG tablet Take 1 tablet by mouth daily. Swallow whole. Do NOT crush, cut or chew tablet. Take with food. 30 tablet 11  . emtricitabine-tenofovir  (TRUVADA) 200-300 MG tablet Take 1 tablet by mouth daily. 30 tablet 11  . ENSURE (ENSURE) Take 237 mLs by mouth 2 (two) times daily between meals. 237 mL 11  . hydrocortisone 2.5 % lotion Apply topically 2 (two) times daily. 59 mL 0  . IBU 800 MG tablet Take 800 mg by mouth 2 (two) times daily as needed.  5  . lurasidone (LATUDA) 20 MG TABS tablet Take 1 tablet (20 mg total) by mouth daily with breakfast. 30 tablet 0  . megestrol (MEGACE) 40 MG tablet Take 40 mg by mouth daily.  2  . methocarbamol (ROBAXIN) 500 MG tablet take 1 BY MOUTH TWICE DAILY AS NEEDED  5  . sertraline (ZOLOFT) 50 MG tablet Take 1 tablet (50 mg total) by mouth daily. 30 tablet 5  . benztropine (COGENTIN) 0.5 MG tablet Take 1 tablet (0.5 mg total) by mouth 2 (two) times daily. 6 tablet 0  . acetaminophen (TYLENOL) 325 MG tablet Take 2 tablets (650 mg total) by mouth every 4 (four) hours as needed (for pain scale < 4). (Patient not taking: Reported on 06/10/2017) 30 tablet 0   Facility-Administered Medications Prior to Visit  Medication Dose Route Frequency Provider Last Rate Last Dose  . medroxyPROGESTERone (DEPO-PROVERA) injection 150 mg  150 mg Intramuscular Once Judyann Munson, MD        PAST MEDICAL HISTORY: Past Medical History:  Diagnosis Date  . Anxiety   . Bipolar disorder (HCC)   . Crack cocaine use   . Depression   . h/o crack Cocaine abuse 03/15/2013   UDS positive cocaine  . 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  AFP increased and sent to genetics.  Amnio declined.  [place] nml.   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 Infection   . Otitis media of left ear 03/15/2013  . Schizophrenia (HCC)   . 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 Tardive dyskinesia 06/13/2014  . Tobacco abuse 03/15/2013    PAST SURGICAL HISTORY: Past Surgical History:  Procedure Laterality Date  . CESAREAN SECTION N/A 12/24/2013   Procedure: CESAREAN SECTION;  Surgeon: Lesly Dukes, MD;  Location: [facility];  Service: Obstetrics;  Laterality: N/A;  . COLPOSCOPY  2009  . DENTAL RESTORATION/EXTRACTION WITH X-RAY      FAMILY HISTORY: Family History  Problem Relation Age of Onset  . Diabetes Mother   . High blood pressure Mother   . Cancer Father     SOCIAL HISTORY: Social History   Social History  . Marital status: Single    Spouse name: N/A  . Number of children: 1  . Years of education: 11th   Occupational History  . Not on file.   Social History Main Topics  . Smoking status: Current Every Day Smoker    Packs/day: 0.10    Years: 3.00    Types: Cigarettes  . Smokeless tobacco: Never Used     Comment: cutting back, 1 cigs/day  . Alcohol use No  . Drug use: Yes    Types: Cocaine, Marijuana     Comment: pt states she used this morning  . Sexual activity: Yes    Partners: Male    Birth control/ protection: None     Comment: givne condoms   Other Topics Concern  . Not on file   Social History Narrative   Patient lives at home with her partner Tamara Briggs.   Unemployed.   Education 11 th grade.   Right handed.   Patient does not drink caffeine.     PHYSICAL EXAM  Vitals:   06/10/17 1340  BP: 119/64  Pulse: 90  Weight: 92 lb 3.2 oz (41.8 kg)  Height: 5\' 4"  (1.626 m)   Body mass index is 15.83 kg/m.  Generalized: Well developed, in no acute distress  Head: normocephalic and atraumatic,. Oropharynx benign  Neck: Supple,  Musculoskeletal: No deformity   Neurological examination   Mentation: Alert oriented to time, place, history taking. Attention span and concentration appropriate. Recent and remote memory intact.  Follows all commands speech and language fluent.   Cranial nerve II-XII: .Pupils were equal round reactive to light extraocular movements were full, visual field were full on confrontational test. Facial sensation and strength were normal. hearing was intact to finger rubbing bilaterally. Uvula tongue midline.  head turning and shoulder shrug were normal and symmetric.Tongue protrusion into cheek strength was normal. Motor: normal bulk and tone, full strength in the BUE, BLE, fine finger movements normal, no pronator drift. No focal weakness Sensory: normal and symmetric to light touch, pinprick, and  Vibration, in the upper and lower extremities Coordination: finger-nose-finger, heel-to-shin bilaterally, no dysmetria.Mild choreoathetoid movements are noted  in both upper extremities. Reflexes: Brachioradialis 2/2, biceps 2/2, triceps 2/2, patellar 2/2, Achilles 2/2, plantar responses were flexor bilaterally. Gait and Station: Rising up from seated position without assistance, normal stance,  moderate stride, good arm swing, smooth turning, able to perform tiptoe, and heel walking without difficulty. Tandem gait is steady  DIAGNOSTIC DATA (LABS, IMAGING, TESTING) - I reviewed patient records, labs, notes, testing and imaging myself where available.  Lab  Results  Component Value Date   WBC 3.7 (L) 05/13/2017   HGB 12.0 05/13/2017   HCT 36.9 05/13/2017   MCV 82.6 05/13/2017   PLT 197 05/13/2017      Component Value Date/Time   NA 139 05/13/2017 1708   K 3.3 (L) 05/13/2017 1708   CL 108 05/13/2017 1708   CO2 26 05/13/2017 1708   GLUCOSE 59 (L) 05/13/2017 1708   BUN 10 05/13/2017 1708   CREATININE 0.85 05/13/2017 1708   CALCIUM 8.7 05/13/2017 1708   PROT 8.3 (H) 05/13/2017 1708   ALBUMIN 4.3 04/06/2017 1153   AST 19 05/13/2017 1708   ALT 11 05/13/2017 1708   ALKPHOS 81 04/06/2017 1153   BILITOT 0.2 05/13/2017 1708   GFRNONAA 91 05/13/2017 1708   GFRAA 105 05/13/2017 1708   Lab Results  Component Value Date   CHOL 127 10/31/2015   HDL 63 10/31/2015   LDLCALC 45 10/31/2015   TRIG 97 10/31/2015   CHOLHDL 2.0 10/31/2015    ASSESSMENT AND PLAN 31 y.o. year old female has a past medical history of Crack cocaine use; Anxiety; Depression; Schizophrenia (HCC); Bipolar disorder (HCC); HIV  test positive (HCC); Secondary Parkinson disease (HCC) (06/13/2014); and Tardive dyskinesia (06/13/2014). Here to follow up. Very mild choreoathetoid movements are noted  in both upper extremities only . Patient has been off Cogentin for about 6 months. She does not wish to restart the medication at this time  Pt off  Cogentin Follow-up yearly and when necessary Next with Dr. Woody SellerWillis Reiley Keisler Carolyn Kristion Holifield, [facility]NP, [facility], APRN  [facility] [address], [address] [place], [place] [address] [phone]

## 2017-06-10 ENCOUNTER — Ambulatory Visit (INDEPENDENT_AMBULATORY_CARE_PROVIDER_SITE_OTHER): Payer: Medicaid Other | Admitting: Nurse Practitioner

## 2017-06-10 ENCOUNTER — Encounter: Payer: Self-pay | Admitting: Neurology

## 2017-06-10 ENCOUNTER — Encounter: Payer: Self-pay | Admitting: Nurse Practitioner

## 2017-06-10 VITALS — BP 119/64 | HR 90 | Ht 64.0 in | Wt 92.2 lb

## 2017-06-10 DIAGNOSIS — G2119 Other drug induced secondary parkinsonism: Secondary | ICD-10-CM | POA: Diagnosis not present

## 2017-06-10 NOTE — Patient Instructions (Signed)
Pt off  Cogentin Follow-up yearly and when necessary Next with Dr. Anne HahnWillis

## 2017-06-28 ENCOUNTER — Ambulatory Visit (INDEPENDENT_AMBULATORY_CARE_PROVIDER_SITE_OTHER): Payer: Medicaid Other | Admitting: Internal Medicine

## 2017-06-28 ENCOUNTER — Encounter: Payer: Self-pay | Admitting: Internal Medicine

## 2017-06-28 VITALS — BP 110/78 | HR 100 | Temp 98.8°F | Ht 64.0 in | Wt 91.0 lb

## 2017-06-28 DIAGNOSIS — R197 Diarrhea, unspecified: Secondary | ICD-10-CM

## 2017-06-28 DIAGNOSIS — B20 Human immunodeficiency virus [HIV] disease: Secondary | ICD-10-CM

## 2017-06-28 DIAGNOSIS — E43 Unspecified severe protein-calorie malnutrition: Secondary | ICD-10-CM

## 2017-06-28 DIAGNOSIS — Z23 Encounter for immunization: Secondary | ICD-10-CM | POA: Diagnosis not present

## 2017-06-28 NOTE — Progress Notes (Signed)
RFV: follow up for hiv disease  Patient ID: Tamara Briggs, female   DOB: [birth year], 31 y.o.   MRN: [id number]  HPI 31yo F with hiv disease, CD 4 count of 540/83,900 (sep 2018) on truvada/ATVr . Hx of schizophrenia/bipolar disease, on low dose latuda (previously had tardive dyskinesia on high dose latuda) now only mild choreoakithesia noted in upper extremities. She still reports intermittent adherence to her medications. She is unable to tell how many doses she misses but often is taking her medications at different times of the day thus increasing risk of missing doses. She states that her boyfriend helps remind her to take her meds  Her boyfriend is here -willing to do partner notification  They last had unprotected sex 1 week ago.  Outpatient Encounter Medications as of 06/28/2017  Medication Sig  . acetaminophen (TYLENOL) 325 MG tablet Take 2 tablets (650 mg total) by mouth every 4 (four) hours as needed (for pain scale < 4). (Patient not taking: Reported on 06/10/2017)  . atazanavir-cobicistat (EVOTAZ) 300-150 MG tablet Take 1 tablet by mouth daily. Swallow whole. Do NOT crush, cut or chew tablet. Take with food.  Marland Kitchen emtricitabine-tenofovir (TRUVADA) 200-300 MG tablet Take 1 tablet by mouth daily.  Marland Kitchen ENSURE (ENSURE) Take 237 mLs by mouth 2 (two) times daily between meals.  . hydrocortisone 2.5 % lotion Apply topically 2 (two) times daily.  . IBU 800 MG tablet Take 800 mg by mouth 2 (two) times daily as needed.  . lurasidone (LATUDA) 20 MG TABS tablet Take 1 tablet (20 mg total) by mouth daily with breakfast.  . megestrol (MEGACE) 40 MG tablet Take 40 mg by mouth daily.  . methocarbamol (ROBAXIN) 500 MG tablet take 1 BY MOUTH TWICE DAILY AS NEEDED  . sertraline (ZOLOFT) 50 MG tablet Take 1 tablet (50 mg total) by mouth daily.   Facility-Administered Encounter Medications as of 06/28/2017  Medication  . medroxyPROGESTERone (DEPO-PROVERA) injection 150 mg     Patient Active Problem List    Diagnosis Date Noted  . Crack cocaine use   . Bipolar disorder (HCC)   . 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 Tardive dyskinesia 06/13/2014  . Schizophrenia (HCC) 10/18/2013  . HIV (human immunodeficiency virus infection) (HCC) 07/27/2011     Health Maintenance Due  Topic Date Due  . INFLUENZA VACCINE  03/24/2017     Review of Systems Review of Systems  Constitutional: Negative for fever, chills, diaphoresis, activity change, appetite change, fatigue and unexpected weight change.  HENT: Negative for congestion, sore throat, rhinorrhea, sneezing, trouble swallowing and sinus pressure.  Eyes: Negative for photophobia and visual disturbance.  Respiratory: Negative for cough, chest tightness, shortness of breath, wheezing and stridor.  Cardiovascular: Negative for chest pain, palpitations and leg swelling.  Gastrointestinal: + diarrhea with 2 ensure shakes Genitourinary: Negative for dysuria, hematuria, flank pain and difficulty urinating.  Musculoskeletal: Negative for myalgias, back pain, joint swelling, arthralgias and gait problem.  Skin: Negative for color change, pallor, rash and wound.  Neurological: Negative for dizziness, tremors, weakness and light-headedness.  Hematological: Negative for adenopathy. Does not bruise/bleed easily.  Psychiatric/Behavioral: Negative for behavioral problems, confusion, sleep disturbance, dysphoric mood, decreased concentration and agitation.    Physical Exam  BP 110/78   Pulse 100   Temp 98.8 F (37.1 C) (Oral)   Ht 5\' 4"  (1.626 m)   Wt 91 lb (41.3 kg)   BMI 15.62 kg/m  Physical Exam  Constitutional:  oriented to person, place, and time. appears  chronically ill, undernourished. No distress.  HENT: [place]/AT, PERRLA, no scleral icterus Mouth/Throat: Oropharynx is clear and moist. No oropharyngeal exudate.  Cardiovascular: Normal rate, regular rhythm and normal heart sounds. Exam reveals no gallop and no friction rub.  No murmur  heard.  Pulmonary/Chest: Effort normal and breath sounds normal. No respiratory distress.  has no wheezes.  Neck = supple, no nuchal rigidity Abdominal: Soft. Bowel sounds are normal.  exhibits no distension. There is no tenderness.  Lymphadenopathy: no cervical adenopathy. No axillary adenopathy Neurological: alert and oriented to person, place, and time.  Skin: Skin is warm and dry. No rash noted. No erythema.  Psychiatric: a normal mood and affect.  behavior is normal.   Lab Results  Component Value Date   CD4TCELL 32 (L) 05/13/2017   Lab Results  Component Value Date   CD4TABS 540 05/13/2017   CD4TABS 370 (L) 04/06/2017   CD4TABS 250 (L) 10/25/2016   Lab Results  Component Value Date   HIV1RNAQUANT 83,900 (H) 05/13/2017   Lab Results  Component Value Date   HEPBSAB POS (A) 07/09/2011   Lab Results  Component Value Date   LABRPR NON REAC 04/06/2017    CBC Lab Results  Component Value Date   WBC 3.7 (L) 05/13/2017   RBC 4.47 05/13/2017   HGB 12.0 05/13/2017   HCT 36.9 05/13/2017   PLT 197 05/13/2017   MCV 82.6 05/13/2017   MCH 26.8 (L) 05/13/2017   MCHC 32.5 05/13/2017   RDW 14.4 05/13/2017   LYMPHSABS 1,621 05/13/2017   MONOABS 432 04/06/2017   EOSABS 170 05/13/2017    BMET Lab Results  Component Value Date   NA 139 05/13/2017   K 3.3 (L) 05/13/2017   CL 108 05/13/2017   CO2 26 05/13/2017   GLUCOSE 59 (L) 05/13/2017   BUN 10 05/13/2017   CREATININE 0.85 05/13/2017   CALCIUM 8.7 05/13/2017   GFRNONAA 91 05/13/2017   GFRAA 105 05/13/2017      Assessment and Plan  hiv disease = reinforced adherence   Plan to have her partner enrolled for PrEP  Diarrhea = in part sounds like it maybe due to milk or high sugar content. Asked her to think of other protein sources. Can do ensure once a day to see if diarrhea improves rather than the 2 cans she takes. Try protein bars. Gave food bag from pantry  Health maintenance = flu and pneumococcal are due  today  Will see back in 4 wk to check vl and give depo  Health maintenance = will give flu and pneumococcal vaccine   Spent 40 min with more than 50% on counseling on hiv adherence

## 2017-06-30 ENCOUNTER — Ambulatory Visit (INDEPENDENT_AMBULATORY_CARE_PROVIDER_SITE_OTHER): Payer: Medicaid Other | Admitting: Pharmacist Clinician (PhC)/ Clinical Pharmacy Specialist

## 2017-06-30 DIAGNOSIS — B20 Human immunodeficiency virus [HIV] disease: Secondary | ICD-10-CM

## 2017-06-30 MED ORDER — DARUN-COBIC-EMTRICIT-TENOFAF 800-150-200-10 MG PO TABS: 1.0000 | ORAL_TABLET | Freq: Every day | ORAL | 5 refills | Status: DC

## 2017-06-30 NOTE — Progress Notes (Signed)
HPI: Tamara Briggs is a 31 y.o. female who is here with her boyfriend for his PrEP visit.   Allergies: No Known Allergies  Vitals:    Past Medical History: Past Medical History:  Diagnosis Date  . Anxiety   . Bipolar disorder (HCC)   . Crack cocaine use   . Depression   . h/o crack Cocaine abuse 03/15/2013   UDS positive cocaine  . 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  AFP increased and sent to genetics.  Amnio declined.  US nml.   Marland Kitchen. 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 Infection   . Otitis media of left ear 03/15/2013  . Schizophrenia (HCC)   . 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 Tardive dyskinesia 06/13/2014  . Tobacco abuse 03/15/2013    Social History: Social History   Socioeconomic History  . Marital status: Single    Spouse name: Not on file  . Number of children: 1  . Years of education: 11th  . Highest education level: Not on file  Social Needs  . Financial resource strain: Not on file  . Food insecurity - worry: Not on file  . Food insecurity - inability: Not on file  . Transportation needs - medical: Not on file  . Transportation needs - non-medical: Not on file  Occupational History  . Not on file  Tobacco Use  . Smoking status: Current Every Day Smoker    Packs/day: 0.10    Years: 3.00    Pack years: 0.30    Types: Cigarettes  . Smokeless tobacco: Never Used  . Tobacco comment: cutting back, 1 cigs/day  Substance and Sexual Activity  . Alcohol use: No    Alcohol/week: 0.0 oz  . Drug use: Yes    Types: Cocaine, Marijuana    Comment: pt states she used this morning  . Sexual activity: Yes    Partners: Male    Birth control/protection: None    Comment: givne condoms  Other Topics Concern  . Not on file  Social History Narrative   Patient lives at home with her partner Tamara Briggs.   Unemployed.   Education 11 th grade.   Right handed.   Patient does not drink caffeine.     Previous Regimen: Stribild, ATV/r/Descovy  Current Regimen: Evotaz/TRV  Labs: HIV 1 RNA Quant (copies/mL)  Date Value  05/13/2017 83,900 (H)  04/06/2017 97,500 (H)  07/08/2016 5,552 (H)   CD4 T Cell Abs (/uL)  Date Value  05/13/2017 540  04/06/2017 370 (L)  10/25/2016 250 (L)   Hep B S Ab (no units)  Date Value  07/09/2011 POS (A)   Hepatitis B Surface Ag (no units)  Date Value  08/06/2016 Negative   HCV Ab (no units)  Date Value  07/09/2011 NEGATIVE    CrCl: CrCl cannot be calculated (Patient's most recent lab result is older than the maximum 21 days allowed.).  Lipids:    Component Value Date/Time   CHOL 127 10/31/2015 1000   TRIG 97 10/31/2015 1000   HDL 63 10/31/2015 1000   CHOLHDL 2.0 10/31/2015 1000   VLDL 19 10/31/2015 1000   LDLCALC 45 10/31/2015 1000    Assessment: Tamara Briggs is here with her boyfriend for the PrEP visit. I decided to see her also. 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 She was recently off therapy again, therefore, her VL has gotten out of  control again.  She said that she has been taking now though. Offered her to change to a "smaller" tablet regimen of Symtuza. She agreed to it and want it sent [facility] because they deliver to her house. She called them and they will deliver tomorrow. She has an upcoming appt with Dr. Drue Briggs in Dec so it'll be a perfect time to repeat her VL.  Recommendations:  Stop Evotaz/TRV Start Symtuza 1 PO qday F/u with Dr. Drue Briggs in Dec  Tamara Briggs, PharmD, BCPS, AAHIVP, CPP Clinical Infectious Disease Pharmacist [facility] 06/30/2017, 3:53 PM

## 2017-06-30 NOTE — Patient Instructions (Signed)
Stop Evotaz and Truvada Start Symtuza 1 tab daily with breakfast Follow up with Dr. Drue SecondSnider in December

## 2017-07-13 ENCOUNTER — Telehealth: Payer: Self-pay | Admitting: Pharmacist Clinician (PhC)/ Clinical Pharmacy Specialist

## 2017-07-13 NOTE — Telephone Encounter (Signed)
HIV Genotype Composite Data Genotype Dates:   Mutations in Bold impact drug susceptibility RT Mutations M184I, V179E  PI Mutations   Integrase Mutations    Interpretation of Genotype Data per [employer] HIV Database Nucleoside RTIs  abacavir (ABC) Low-Level Resistance zidovudine (AZT) Susceptible emtricitabine (FTC) High-Level Resistance lamivudine (3TC) High-Level Resistance tenofovir (TDF) Susceptible   Non-Nucleoside RTIs  doravirine (DOR) Susceptible efavirenz (EFV) Potential Low-Level Resistance etravirine (ETR) Potential Low-Level Resistance nevirapine (NVP) Potential Low-Level Resistance rilpivirine (RPV) Potential Low-Level Resistance   Protease Inhibitors     Integrase Inhibitors

## 2017-07-14 ENCOUNTER — Telehealth: Payer: Self-pay | Admitting: *Deleted

## 2017-07-14 ENCOUNTER — Other Ambulatory Visit: Payer: Self-pay | Admitting: Pharmacist Clinician (PhC)/ Clinical Pharmacy Specialist

## 2017-07-14 ENCOUNTER — Telehealth: Payer: Self-pay | Admitting: Pharmacist Clinician (PhC)/ Clinical Pharmacy Specialist

## 2017-07-14 ENCOUNTER — Telehealth: Payer: Self-pay | Admitting: Licensed Clinical Social Worker

## 2017-07-14 NOTE — Telephone Encounter (Signed)
[facility] and introduced myself and introduced behavioral health services.  Patient stated that [facility] but denied having a therapist.  Patient stated that she was looking for a therapist and was [facility] after meeting with pharmacy on Tuesday.  Vergia AlbertsSherry Per Beagley, [facility]

## 2017-07-14 NOTE — Telephone Encounter (Signed)
Patient called, stating she is having trouble with Symtuza. She is taking it in the morning with food, Latuda at night. She is having night sweats around 3 am that keep her up for several hours. She also reports memory problems, increased sleepiness, and tremors.  She does not want to continue this current medication plan.  Andree CossHowell, Katharin Schneider M, RN

## 2017-07-14 NOTE — Telephone Encounter (Signed)
Call Tamara Briggs to bring her in so we can change her ART to DOR/Biktarvy because she has reaction to it. It also has an interaction with her Latuda. She will coordinate with Medicaid for transportation so they can take her on Tues.

## 2017-07-19 NOTE — Telephone Encounter (Signed)
minh- can you bring Tamara Briggs in to discuss treatment options. I don't think she will take the symtuza

## 2017-07-19 NOTE — Telephone Encounter (Signed)
She is coming in tomorrow so I can change her to Biktarvy/Doravirine.

## 2017-07-20 ENCOUNTER — Telehealth: Payer: Self-pay | Admitting: Licensed Clinical Social Worker

## 2017-07-20 ENCOUNTER — Ambulatory Visit (INDEPENDENT_AMBULATORY_CARE_PROVIDER_SITE_OTHER): Payer: Medicaid Other | Admitting: Pharmacist Clinician (PhC)/ Clinical Pharmacy Specialist

## 2017-07-20 ENCOUNTER — Ambulatory Visit (INDEPENDENT_AMBULATORY_CARE_PROVIDER_SITE_OTHER): Payer: Medicaid Other | Admitting: Licensed Clinical Social Worker

## 2017-07-20 DIAGNOSIS — B20 Human immunodeficiency virus [HIV] disease: Secondary | ICD-10-CM

## 2017-07-20 DIAGNOSIS — F1410 Cocaine abuse, uncomplicated: Secondary | ICD-10-CM

## 2017-07-20 DIAGNOSIS — F251 Schizoaffective disorder, depressive type: Secondary | ICD-10-CM

## 2017-07-20 MED ORDER — BICTEGRAVIR-EMTRICITAB-TENOFOV 50-200-25 MG PO TABS: 1.0000 | ORAL_TABLET | Freq: Every day | ORAL | 2 refills | Status: DC

## 2017-07-20 MED ORDER — DORAVIRINE 100 MG PO TABS: 100.0000 mg | ORAL_TABLET | Freq: Every day | ORAL | 2 refills | Status: DC

## 2017-07-20 NOTE — Progress Notes (Signed)
HPI: Tamara Briggs is a 31 y.o. female who is here to see pharmacy to deal with her ART.   Allergies: No Known Allergies  Vitals:    Past Medical History: Past Medical History:  Diagnosis Date  . Anxiety   . Bipolar disorder (HCC)   . Crack cocaine use   . Depression   . h/o crack Cocaine abuse 03/15/2013   UDS positive cocaine  . 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  AFP increased and sent to genetics.  Amnio declined.  US nml.   Marland Kitchen. 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 Infection   . Otitis media of left ear 03/15/2013  . Schizophrenia (HCC)   . 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 Tardive dyskinesia 06/13/2014  . Tobacco abuse 03/15/2013    Social History: Social History   Socioeconomic History  . Marital status: Single    Spouse name: Not on file  . Number of children: 1  . Years of education: 11th  . Highest education level: Not on file  Social Needs  . Financial resource strain: Not on file  . Food insecurity - worry: Not on file  . Food insecurity - inability: Not on file  . Transportation needs - medical: Not on file  . Transportation needs - non-medical: Not on file  Occupational History  . Not on file  Tobacco Use  . Smoking status: Current Every Day Smoker    Packs/day: 0.10    Years: 3.00    Pack years: 0.30    Types: Cigarettes  . Smokeless tobacco: Never Used  . Tobacco comment: cutting back, 1 cigs/day  Substance and Sexual Activity  . Alcohol use: No    Alcohol/week: 0.0 oz  . Drug use: Yes    Types: Cocaine, Marijuana    Comment: pt states she used this morning  . Sexual activity: Yes    Partners: Male    Birth control/protection: None    Comment: givne condoms  Other Topics Concern  . Not on file  Social History Narrative   Patient lives at home with her partner Valda LambRobert Mason.   Unemployed.   Education 11 th grade.   Right handed.   Patient does not drink caffeine.     Previous Regimen: Evotaz/TRV, Symtuza  Current Regimen: None  Labs: HIV 1 RNA Quant (copies/mL)  Date Value  05/13/2017 83,900 (H)  04/06/2017 97,500 (H)  07/08/2016 5,552 (H)   CD4 T Cell Abs (/uL)  Date Value  05/13/2017 540  04/06/2017 370 (L)  10/25/2016 250 (L)   Hep B S Ab (no units)  Date Value  07/09/2011 POS (A)   Hepatitis B Surface Ag (no units)  Date Value  08/06/2016 Negative   HCV Ab (no units)  Date Value  07/09/2011 NEGATIVE    CrCl: CrCl cannot be calculated (Patient's most recent lab result is older than the maximum 21 days allowed.).  Lipids:    Component Value Date/Time   CHOL 127 10/31/2015 1000   TRIG 97 10/31/2015 1000   HDL 63 10/31/2015 1000   CHOLHDL 2.0 10/31/2015 1000   VLDL 19 10/31/2015 1000   LDLCALC 45 10/31/2015 1000    Assessment: Aloha has had a terrible hx of adherence but she does a pretty good job at showing up for her appts. Recently, I saw her boyfriend for uninsured PrEP here. However, [facility] informed me that they have broken up  and she has been kicked out of his place. She doesn't have any place to stay right now and the temperature will drop to below 20 tonight. Dorene Grebeatalie will [facility] and several other sources to see if if they can find a temporary place for her.   She stopped taking Symtuza because of tremors and night sweats. I believe that part of the problem is due to the JordanLatuda that she is on. It's a contraindication with all of boosted PIs and cobi containing regimen. See resistance chart on 11/20. After discussion with Dr. Drue SecondSnider, we will try to use doravirine/Biktarvy in her. She has an upcoming appt with Dr. Drue SecondSnider in Dec.   I'm going to try to [facility] out [facility] to see if they can take her off of Latuda so we can use a PI based regimen. [facility] so [facility] can [facility] them to her.   Her general mood was much worse today that the last time that I saw her.  I think several factors play into this (depression, homelessness, food, relationship). She will also meet with Cordelia PenSherry today.  Recommendations:  Stop Symtuza Start Doravirine 100mg  PO qday Start Biktarvy 1 PO qday F/u with Dr Drue SecondSnider on 12/17  Ulyses SouthwardMinh Ezell Poke, PharmD, BCPS, AAHIVP, CPP Clinical Infectious Disease Pharmacist [facility] 07/20/2017, 11:46 AM

## 2017-07-20 NOTE — Telephone Encounter (Signed)
[facility] called [facility] and left a voice message for Myna HidalgoStephanie George, nurse of Dr. Theola SequinAlamu, desiring to coordinate care based on patient's current psychotic symptoms.  [facility] requested a return call back to discuss possibly moving her next scheduled psychiatric appointment from Jan 2019 sooner due to current symptoms.  Vergia AlbertsSherry Beckhem Isadore, [facility]

## 2017-07-20 NOTE — Telephone Encounter (Signed)
The intent of this communication is to inform the Health Care Team that this patient will be [facility]  Greater than 3 attempts have been made to re-engage the patient  without any success .Moving forward, the [facility] will be willing to reopen the patient to services if and when the patient is ready to discuss medication adherence and HIV disease  management. Effective 02/27/17  patient will be discharged and removed from [facility] active patient listing.

## 2017-07-20 NOTE — Patient Instructions (Signed)
Start your new medications as soon as [facility] get [facility]  -Doravirine 100mg  by mouth nightly -Biktarvy 1 tablet by mouth nightly

## 2017-07-20 NOTE — BH Specialist Note (Signed)
Integrated Behavioral Health Initial Visit  MRN: [id number] Name: Tamara Briggs  Number of Integrated Behavioral Health Clinician visits:: 1/6 Session Start time: 11:51 am  Session End time: 12:22 pm Total time: 30 minutes  Type of Service: Integrated Behavioral Health- Individual/Family Interpretor:No. Interpretor Name and Language: N/A  SUBJECTIVE: Tamara Briggs is a 31 y.o. female accompanied by self Patient was referred by Dr. Drue SecondSnider for substance use.  Patient reports the following symptoms/concerns: Patient reported [facility] and stated that she meets with Dr. Theola SequinAlamu every 3 months.  Patient reported auditory and visual hallucinations, without commands to injure self, that occur about twice a week, which was last experienced 2 weeks ago.  Patient stated that the hallucinations make her become frightened and are distressful stating, "I be too scared to move to go anywhere".  However patient did admit that if she has a severely distressing hallucination in between appointments she will report to the emergency department.  Patient denied having coping skills to manage distress from hallucinations and reported the hallucinations infrequently command her to hurt others, but she does not follow.  Patient denied receiving weekly counseling sessions from [facility].  Patient reported her next psychiatric appointment is scheduled for January 2019.  Patient reported being prescribed Latuda and Zoloft from Dr. Theola SequinAlamu and reported efficacy, but denied being prescribed an antipsychotic.  [facility] received patient's verbal consent to contact [facility] to collaborate care and called and left a message for Myna HidalgoStephanie George, Dr. Tacey RuizAlamu's nurse.  [facility] left message with patient's last name and DOB and current symptoms and requested a return call to coordinate care and get her next appointment moved up.  Patient also reported Crack Cocaine use, smoking about $10 once a  week, with last use on Thursday.  Patient became guarded and irritable when questioned about her substance use and discussing substance treatment.  After answering all of the questions without issue related to her mental health status, medications, and symptoms, patient informed that the questions the [facility] was asking was "frustrating" her and stated that she did not want to meet further.  Patient began to gather her personal items and stated that she wanted to go back the [facility] office and speak with her case manager.  Severity of problem: moderate  OBJECTIVE: Mood: Anxious and Irritable and Affect: within range Risk of harm to self or others: No plan to harm self or others  ASSESSMENT: Patient is currently experiencing Crack Cocaine use and active psychosis.  [facility] was not able to determine if the hallucinations were the result of Cocaine intoxication or withdrawal.  Patient may benefit from further substance assessment and treatment, psychiatric assessment, medication management, and behavioral health services.   GOALS ADDRESSED: Patient will: 1. Reduce symptoms of: hallucinations, substance use 2. Increase knowledge and/or ability of: coping skills, healthy habits and self-management skills  3. Demonstrate ability to: Increase healthy adjustment to current life circumstances, Increase adequate support systems for patient/family, Decrease self-medicating behaviors and maintain sobriety  INTERVENTIONS: Interventions utilized: Supportive Counseling and Link to [facility]   PLAN: 1. Behavioral recommendations: Patient stated that she is willing to [facility] and will receive appointment/intake and transportation assistance from [facility] her [facility] case manager. 2. Referral(s): Community Mental Health Services (LME/Outside Clinic) and Substance Abuse Program  3. [facility] and will recommend weekly counseling there.   Vergia AlbertsSherry  Advith Martine, [facility]

## 2017-07-21 ENCOUNTER — Telehealth: Payer: Self-pay | Admitting: *Deleted

## 2017-07-21 NOTE — Telephone Encounter (Signed)
Received call from pharmacy, stating Angenette refused her new HIV regimen. They were asking for assistance. RN called Jerlyn. [facility] for [facility] days per her report, states she has her Symtuza ready to go. RN reminded her she couldn't take that any more due to side effects and interactions with her other important medications, asked her to please reconsider the new regimen. Patient agreed, would accept delivery, asked for it to come to address listed in chart. RN called pharmacy back, they will coordinate delivery tonight before her admission tomorrow. Andree CossHowell, Takashi Korol M, RN

## 2017-08-09 ENCOUNTER — Ambulatory Visit: Payer: Medicaid Other | Admitting: Internal Medicine

## 2017-08-20 ENCOUNTER — Other Ambulatory Visit: Payer: Self-pay | Admitting: Nurse Practitioner

## 2017-08-24 NOTE — L&D Delivery Note (Signed)
OB/GYN Faculty Practice Delivery Note  Farah Lepak is a 32 y.o. [id number] s/p VBAC at [redacted]w[redacted]d. She was admitted for PPROM.   ROM: 40h 64m with clear fluid GBS Status: positive (PCN) Maximum Maternal Temperature: Temp (24hrs), Avg:98.4 F (36.9 C), Min:97.9 F (36.6 C), Max:98.7 F (37.1 C)  Labor Progress: . Admitted with PPROM, given BMZ, Mg+ and latency antibiotics . 2nd dose of BMZ given . Pitocin started   Delivery Date/Time: 06/08/18 at 0739 Delivery: Called to room and patient was complete and pushing. Head delivered LOA. No nuchal cord present. Shoulder and body delivered in usual fashion. Infant with spontaneous cry, placed on mother's abdomen, dried and stimulated. Cord clamped x 2 after 1-minute delay, and cut by provider. Cord blood drawn. Placenta delivered spontaneously with gentle cord traction. Fundus firm with massage and Pitocin. Labia, perineum, vagina, and cervix inspected inspected with no lacerations.   Placenta: spontaneous, intact, 3-vessel cord, sent to pathology  Complications: infant to NICU (present at delivery), prolonged PPROM Lacerations: none EBL:  Postpartum Planning [x]  message to sent to schedule follow-up  [x]  vaccines UTD  Infant: female  APGARs 43,8  1750g  62 [address]. Earlene Plater, DO OB/GYN Fellow, Faculty Practice

## 2017-09-02 ENCOUNTER — Encounter (HOSPITAL_COMMUNITY): Payer: Self-pay

## 2017-09-02 ENCOUNTER — Emergency Department (HOSPITAL_COMMUNITY)
Admission: EM | Admit: 2017-09-02 | Discharge: 2017-09-02 | Disposition: A | Discharge status: Home / Self Care | Payer: Medicaid Other | Attending: Emergency Medicine | Admitting: Emergency Medicine

## 2017-09-02 ENCOUNTER — Other Ambulatory Visit: Payer: Self-pay

## 2017-09-02 DIAGNOSIS — Y998 Other external cause status: Secondary | ICD-10-CM | POA: Insufficient documentation

## 2017-09-02 DIAGNOSIS — F17210 Nicotine dependence, cigarettes, uncomplicated: Secondary | ICD-10-CM | POA: Diagnosis not present

## 2017-09-02 DIAGNOSIS — W57XXXA Bitten or stung by nonvenomous insect and other nonvenomous arthropods, initial encounter: Secondary | ICD-10-CM | POA: Insufficient documentation

## 2017-09-02 DIAGNOSIS — G20 Parkinson's disease: Secondary | ICD-10-CM | POA: Insufficient documentation

## 2017-09-02 DIAGNOSIS — B20 Human immunodeficiency virus [HIV] disease: Secondary | ICD-10-CM | POA: Diagnosis not present

## 2017-09-02 DIAGNOSIS — S1096XA Insect bite of unspecified part of neck, initial encounter: Secondary | ICD-10-CM | POA: Diagnosis not present

## 2017-09-02 DIAGNOSIS — Z79899 Other long term (current) drug therapy: Secondary | ICD-10-CM | POA: Insufficient documentation

## 2017-09-02 DIAGNOSIS — L299 Pruritus, unspecified: Secondary | ICD-10-CM | POA: Diagnosis present

## 2017-09-02 DIAGNOSIS — Y939 Activity, unspecified: Secondary | ICD-10-CM | POA: Diagnosis not present

## 2017-09-02 DIAGNOSIS — Y9259 Other trade areas as the place of occurrence of the external cause: Secondary | ICD-10-CM | POA: Insufficient documentation

## 2017-09-02 DIAGNOSIS — S40862A Insect bite (nonvenomous) of left upper arm, initial encounter: Secondary | ICD-10-CM | POA: Diagnosis not present

## 2017-09-02 DIAGNOSIS — S40861A Insect bite (nonvenomous) of right upper arm, initial encounter: Secondary | ICD-10-CM | POA: Diagnosis not present

## 2017-09-02 MED ORDER — HYDROCORTISONE 2.5 % EX LOTN: TOPICAL_LOTION | Freq: Two times a day (BID) | CUTANEOUS | 0 refills | Status: DC

## 2017-09-02 MED ORDER — DIPHENHYDRAMINE HCL 25 MG PO CAPS
25.0000 mg | ORAL_CAPSULE | Freq: Once | ORAL | Status: AC
  Administered 2017-09-02: 25 mg via ORAL
  Filled 2017-09-02: qty 1

## 2017-09-02 MED ORDER — DIPHENHYDRAMINE HCL 25 MG PO TABS: 25.0000 mg | ORAL_TABLET | Freq: Four times a day (QID) | ORAL | 0 refills | Status: DC

## 2017-09-02 NOTE — ED Provider Notes (Signed)
[facility] Provider Note   CSN: [id number] Arrival date & time: 09/02/17  1331     History   Chief Complaint Chief Complaint  Patient presents with  . Insect Bite    HPI Tamara Briggs is a 32 y.o. female.  HPI   32 year old female presents today with complaints of bedbugs.  Patient reports she was sleeping in a hotel last night woke up this morning with obvious bed bugs biting her.  She notes she saw them in the room, feels similar to the last time she was bitten by bedbugs.  She notes itching to the upper extremities and neck chest and back with small red marks.  She reports that she throughout her close that she slept in last night.. Patient denies any fever, no medications prior to arrival.  Past Medical History:  Diagnosis Date  . Anxiety   . Bipolar disorder (HCC)   . Crack cocaine use   . Depression   . h/o crack Cocaine abuse 03/15/2013   UDS positive cocaine  . 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  AFP increased and sent to genetics.  Amnio declined.  US nml.   Marland Kitchen. 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 Infection   . Otitis media of left ear 03/15/2013  . Schizophrenia (HCC)   . 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 Tardive dyskinesia 06/13/2014  . Tobacco abuse 03/15/2013    Patient Active Problem List   Diagnosis Date Noted  . Crack cocaine use   . Bipolar disorder (HCC)   . 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 Tardive dyskinesia 06/13/2014  . Schizophrenia (HCC) 10/18/2013  . HIV (human immunodeficiency virus infection) (HCC) 07/27/2011    Past Surgical History:  Procedure Laterality Date  . CESAREAN SECTION N/A 12/24/2013   Procedure: CESAREAN SECTION;  Surgeon: Lesly DukesKelly H Leggett, MD;  Location: [facility];  Service: Obstetrics;  Laterality: N/A;  . COLPOSCOPY  2009  . DENTAL RESTORATION/EXTRACTION WITH X-RAY      OB History    Gravida Para Term  Preterm AB Living   4 3 1 2 1 2    SAB TAB Ectopic Multiple Live Births   1 0 0 0 3       Home Medications    Prior to Admission medications   Medication Sig Start Date End Date Taking? Authorizing Provider  bictegravir-emtricitabine-tenofovir AF (BIKTARVY) 50-200-25 MG TABS tablet Take 1 tablet by mouth daily. 07/20/17  Yes Judyann MunsonSnider, Cynthia, MD  doravirine (PIFELTRO) 100 MG TABS tablet Take 1 tablet (100 mg total) by mouth at bedtime. 07/20/17  Yes Judyann MunsonSnider, Cynthia, MD  ENSURE (ENSURE) Take 237 mLs by mouth 2 (two) times daily between meals. 04/30/17  Yes Judyann MunsonSnider, Cynthia, MD  IBU 800 MG tablet Take 800 mg by mouth 2 (two) times daily as needed. 10/05/16  Yes [provider]  lurasidone (LATUDA) 20 MG TABS tablet Take 1 tablet (20 mg total) by mouth daily with breakfast. 11/12/16  Yes Benjiman CorePickering, Nathan, MD  megestrol (MEGACE) 40 MG tablet Take 40 mg by mouth daily. 04/21/17  Yes [provider]  methocarbamol (ROBAXIN) 500 MG tablet take 1 BY MOUTH TWICE DAILY AS NEEDED 04/21/17  Yes [provider]  sertraline (ZOLOFT) 50 MG tablet Take 1 tablet (50 mg total) by mouth daily. 08/06/14  Yes Judyann MunsonSnider, Cynthia, MD  acetaminophen (TYLENOL) 325 MG tablet Take 2 tablets (650 mg total)  by mouth every 4 (four) hours as needed (for pain scale < 4). Patient not taking: Reported on 06/10/2017 10/25/16   Adair Laundry, MD  diphenhydrAMINE (BENADRYL) 25 MG tablet Take 1 tablet (25 mg total) by mouth every 6 (six) hours. 09/02/17   Kerry Odonohue, Tinnie Gens, PA-C  hydrocortisone 2.5 % lotion Apply topically 2 (two) times daily. 09/02/17   Eyvonne Mechanic, PA-C    Family History Family History  Problem Relation Age of Onset  . Diabetes Mother   . High blood pressure Mother   . Cancer Father     Social History Social History   Tobacco Use  . Smoking status: Current Every Day Smoker    Packs/day: 0.10    Years: 3.00    Pack years: 0.30    Types: Cigarettes  . Smokeless tobacco:  Never Used  . Tobacco comment: cutting back, 1 cigs/day  Substance Use Topics  . Alcohol use: No    Alcohol/week: 0.0 oz  . Drug use: Yes    Types: Cocaine, Marijuana    Comment: pt states she used this morning     Allergies   Patient has no known allergies.   Review of Systems Review of Systems  All other systems reviewed and are negative.    Physical Exam Updated Vital Signs BP 110/63 (BP Location: Right Arm)   Pulse 75   Temp 98.4 F (36.9 C) (Oral)   Resp 16   Ht 5\' 4"  (1.626 m)   Wt 45.4 kg (100 lb)   SpO2 100%   BMI 17.16 kg/m   Physical Exam  Constitutional: She is oriented to person, place, and time. She appears well-developed and well-nourished.  HENT:  Head: Normocephalic and atraumatic.  Eyes: Conjunctivae are normal. Pupils are equal, round, and reactive to light. Right eye exhibits no discharge. Left eye exhibits no discharge. No scleral icterus.  Neck: Normal range of motion. No JVD present. No tracheal deviation present.  Pulmonary/Chest: Effort normal. No stridor.  Neurological: She is alert and oriented to person, place, and time. Coordination normal.  Skin:  Numerous insect bites to chest abdomen back upper extremities and neck-no signs of secondary bacterial infection  Psychiatric: She has a normal mood and affect. Her behavior is normal. Judgment and thought content normal.  Nursing note and vitals reviewed.    ED Treatments / Results  Labs (all labs ordered are listed, but only abnormal results are displayed) Labs Reviewed - No data to display  EKG  EKG Interpretation None       Radiology No results found.  Procedures Procedures (including critical care time)  Medications Ordered in ED Medications  diphenhydrAMINE (BENADRYL) capsule 25 mg (25 mg Oral Given 09/02/17 1719)     Initial Impression / Assessment and Plan / ED Course  I have reviewed the triage vital signs and the nursing notes.  Pertinent labs & imaging results  that were available during my care of the patient were reviewed by me and considered in my medical decision making (see chart for details).     Final Clinical Impressions(s) / ED Diagnoses   Final diagnoses:  Bedbug bite, initial encounter    Labs:   Imaging:  Consults:  Therapeutics:  Discharge Meds: Hydrocortisone, diphenhydramine  Assessment/Plan: 32 year old female presents today with likely bedbugs.  Patient will be prescribed cortisone cream, diphenhydramine.  Patient has removed herself from the hotel, threw away all the close that she was wearing.  Patient encouraged follow-up with the primary care if  symptoms persist.  Patient verbalized understanding and agreement to today's plan.      ED Discharge Orders        Ordered    diphenhydrAMINE (BENADRYL) 25 MG tablet  Every 6 hours     09/02/17 1712    hydrocortisone 2.5 % lotion  2 times daily     09/02/17 1713       Aurthur Wingerter, Josie Dixon 09/02/17 1902    Phillis Haggis, MD 09/17/17 [phone]

## 2017-09-02 NOTE — ED Triage Notes (Signed)
Per Pt, Pt stayed in a hotel last night. Woke up this morning with bite marks on her neck and arms that are itching.

## 2017-09-02 NOTE — ED Notes (Signed)
Declined W/C at D/C and was escorted to lobby by RN. 

## 2017-09-02 NOTE — Discharge Instructions (Signed)
Please read attached information. If you experience any new or worsening signs or symptoms please return to the emergency room for evaluation. Please follow-up with your primary care provider or specialist as discussed. Please use medication prescribed only as directed and discontinue taking if you have any concerning signs or symptoms.   °

## 2017-09-03 ENCOUNTER — Other Ambulatory Visit: Payer: Self-pay | Admitting: *Deleted

## 2017-09-13 ENCOUNTER — Other Ambulatory Visit: Payer: Self-pay | Admitting: Nurse Practitioner

## 2017-09-15 ENCOUNTER — Other Ambulatory Visit: Payer: Self-pay | Admitting: Internal Medicine

## 2017-09-23 ENCOUNTER — Telehealth: Payer: Self-pay | Admitting: Nurse Practitioner

## 2017-09-23 NOTE — Telephone Encounter (Signed)
Error

## 2017-10-06 ENCOUNTER — Ambulatory Visit: Payer: Medicaid Other | Admitting: Internal Medicine

## 2017-11-10 ENCOUNTER — Ambulatory Visit: Payer: Medicaid Other | Admitting: Internal Medicine

## 2017-11-17 ENCOUNTER — Ambulatory Visit: Payer: Medicaid Other | Admitting: Internal Medicine

## 2017-11-23 ENCOUNTER — Encounter: Payer: Self-pay | Admitting: Internal Medicine

## 2017-11-23 ENCOUNTER — Ambulatory Visit (INDEPENDENT_AMBULATORY_CARE_PROVIDER_SITE_OTHER): Payer: Medicaid Other | Admitting: Internal Medicine

## 2017-11-23 VITALS — BP 102/55 | HR 82 | Temp 98.1°F | Ht 64.0 in | Wt 93.0 lb

## 2017-11-23 DIAGNOSIS — Z91199 Patient's noncompliance with other medical treatment and regimen due to unspecified reason: Secondary | ICD-10-CM

## 2017-11-23 DIAGNOSIS — Z3490 Encounter for supervision of normal pregnancy, unspecified, unspecified trimester: Secondary | ICD-10-CM | POA: Diagnosis not present

## 2017-11-23 DIAGNOSIS — F209 Schizophrenia, unspecified: Secondary | ICD-10-CM | POA: Diagnosis not present

## 2017-11-23 DIAGNOSIS — B20 Human immunodeficiency virus [HIV] disease: Secondary | ICD-10-CM | POA: Diagnosis not present

## 2017-11-23 DIAGNOSIS — Z9119 Patient's noncompliance with other medical treatment and regimen: Secondary | ICD-10-CM | POA: Diagnosis not present

## 2017-11-23 LAB — POCT URINE PREGNANCY: PREG TEST UR: POSITIVE — AB

## 2017-11-23 NOTE — Progress Notes (Signed)
RFV: follow up on hiv disease  Patient ID: Tamara Briggs, female   DOB: 01/04/1986, 32 y.o.   MRN: [id number]  HPI [name] is a 32yo F with hiv disease, schizoaffective disorder, substance abuse, CD 4 count of 540/VL 83,000. Last seen in September,  She has missed several appts but is here today. She is staying with her godmother. She states that she still is missing doses in 30d. Unable to tell me how many doses she has missed in the last week.  She takes her latuda, daily, she states that it makes her sleepy. [place]  She only has one sexual partner, she remembers her last period at the mid-end February, but would like to have her depo shot today  Outpatient Encounter Medications as of 11/23/2017  Medication Sig  . acetaminophen (TYLENOL) 325 MG tablet Take 2 tablets (650 mg total) by mouth every 4 (four) hours as needed (for pain scale < 4). (Patient not taking: Reported on 06/10/2017)  . BIKTARVY 50-200-25 MG TABS tablet Take 1 tablet by mouth daily.  . diphenhydrAMINE (BENADRYL) 25 MG tablet Take 1 tablet (25 mg total) by mouth every 6 (six) hours.  . ENSURE (ENSURE) Take 237 mLs by mouth 2 (two) times daily between meals.  . hydrocortisone 2.5 % lotion Apply topically 2 (two) times daily.  . IBU 800 MG tablet Take 800 mg by mouth 2 (two) times daily as needed.  . lurasidone (LATUDA) 20 MG TABS tablet Take 1 tablet (20 mg total) by mouth daily with breakfast.  . megestrol (MEGACE) 40 MG tablet Take 40 mg by mouth daily.  . methocarbamol (ROBAXIN) 500 MG tablet take 1 BY MOUTH TWICE DAILY AS NEEDED  . PIFELTRO 100 MG TABS tablet Take 1 tablet (100 mg total) by mouth at bedtime.  . sertraline (ZOLOFT) 50 MG tablet Take 1 tablet (50 mg total) by mouth daily.   Facility-Administered Encounter Medications as of 11/23/2017  Medication  . medroxyPROGESTERone (DEPO-PROVERA) injection 150 mg     Patient Active Problem List   Diagnosis Date Noted  . Crack cocaine  use   . Bipolar disorder (HCC)   . 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 Tardive dyskinesia 06/13/2014  . Schizophrenia (HCC) 10/18/2013  . HIV (human immunodeficiency virus infection) (HCC) 07/27/2011     There are no preventive care reminders to display for this patient.   Review of Systems Missed menstrual cycle. No n/v/diarrhea/f/chills. 12 point ROS is negative Physical Exam   BP (!) 102/55   Pulse 82   Temp 98.1 F (36.7 C) (Oral)   Ht 5\' 4"  (1.626 m)   Wt 93 lb (42.2 kg)   BMI 15.96 kg/m   Physical Exam  Constitutional:  oriented to person, place, and time. appears chronically iill. No distress.  HENT: [place]/AT, PERRLA, no scleral icterus Mouth/Throat: Oropharynx is clear and moist. No oropharyngeal exudate.  Cardiovascular: Normal rate, regular rhythm and normal heart sounds. Exam reveals no gallop and no friction rub.  No murmur heard.  Pulmonary/Chest: Effort normal and breath sounds normal. No respiratory distress.  has no wheezes.  Neck = supple, no nuchal rigidity Abdominal: Soft. Bowel sounds are normal.  exhibits no distension. There is no tenderness.  Lymphadenopathy: no cervical adenopathy. No axillary adenopathy Neurological: alert and oriented to person, place, and time.  Skin: Skin is warm and dry. No rash noted. No erythema.  Psychiatric: sleepy, easily arousable  Lab Results  Component Value Date  CD4TCELL 32 (L) 05/13/2017   Lab Results  Component Value Date   CD4TABS 540 05/13/2017   CD4TABS 370 (L) 04/06/2017   CD4TABS 250 (L) 10/25/2016   Lab Results  Component Value Date   HIV1RNAQUANT 83,900 (H) 05/13/2017   Lab Results  Component Value Date   HEPBSAB POS (A) 07/09/2011   Lab Results  Component Value Date   LABRPR NON REAC 04/06/2017    CBC Lab Results  Component Value Date   WBC 3.7 (L) 05/13/2017   RBC 4.47 05/13/2017   HGB 12.0 05/13/2017   HCT 36.9 05/13/2017   PLT 197 05/13/2017   MCV 82.6 05/13/2017   MCH  26.8 (L) 05/13/2017   MCHC 32.5 05/13/2017   RDW 14.4 05/13/2017   LYMPHSABS 1,621 05/13/2017   MONOABS 432 04/06/2017   EOSABS 170 05/13/2017    BMET Lab Results  Component Value Date   NA 139 05/13/2017   K 3.3 (L) 05/13/2017   CL 108 05/13/2017   CO2 26 05/13/2017   GLUCOSE 59 (L) 05/13/2017   BUN 10 05/13/2017   CREATININE 0.85 05/13/2017   CALCIUM 8.7 05/13/2017   GFRNONAA 91 05/13/2017   GFRAA 105 05/13/2017      Assessment and Plan  Missed menstrual cycle/amenorrhea = patient is seeking birth control with depo injection. We have screened her in clinic and urine pregnancy is positive. Likely still in the first trimester. Would like to try to figure out what options are available for her: if she would like  to do for [facility]  - will get outreach RN to speak with her next week - if she would like to keep her child ( has 2 children -not in her custody, plus had a late term fetal demise ( she is [phone]), will advocate for her to go to a treatment program with pregnancy.  Has 66 year old son with foster parents that she is able to see once a week, presently. Her last child was born with she had high viral load- he received ART but unsure if he is HIV positive.  hiv disease = will check labs; spent 15 min on counseling for better adherence to medications  Sleepiness = deferred drug screen at this visit.  Schizoaffective disorder = continue to follow up at [facility] for management

## 2017-11-23 NOTE — Patient Instructions (Signed)
  [facility]  [address], [place], [place] [id number]

## 2017-11-24 DIAGNOSIS — E43 Unspecified severe protein-calorie malnutrition: Secondary | ICD-10-CM | POA: Insufficient documentation

## 2017-11-24 LAB — T-HELPER CELL (CD4) - (RCID CLINIC ONLY)
CD4 T CELL ABS: 660 /uL (ref 400–2700)
CD4 T CELL HELPER: 35 % (ref 33–55)

## 2017-11-24 NOTE — Progress Notes (Signed)
error 

## 2017-11-25 LAB — COMPLETE METABOLIC PANEL WITH GFR
AG Ratio: 1 (calc) (ref 1.0–2.5)
ALBUMIN MSPROF: 4 g/dL (ref 3.6–5.1)
ALKALINE PHOSPHATASE (APISO): 74 U/L (ref 33–115)
ALT: 15 U/L (ref 6–29)
AST: 17 U/L (ref 10–30)
BUN: 13 mg/dL (ref 7–25)
CALCIUM: 9 mg/dL (ref 8.6–10.2)
CO2: 28 mmol/L (ref 20–32)
CREATININE: 0.61 mg/dL (ref 0.50–1.10)
Chloride: 106 mmol/L (ref 98–110)
GFR, Est African American: 139 mL/min/{1.73_m2} (ref >=60)
GFR, Est Non African American: 120 mL/min/{1.73_m2} (ref >=60)
GLUCOSE: 88 mg/dL (ref 65–99)
Globulin: 4 g/dL (calc) — ABNORMAL HIGH (ref 1.9–3.7)
Potassium: 3.9 mmol/L (ref 3.5–5.3)
Sodium: 132 mmol/L — ABNORMAL LOW (ref 135–146)
Total Bilirubin: 0.2 mg/dL (ref 0.2–1.2)
Total Protein: 8 g/dL (ref 6.1–8.1)

## 2017-11-25 LAB — CBC WITH DIFFERENTIAL/PLATELET
BASOS PCT: 0.5 %
Basophils Absolute: 22 cells/uL (ref 0–200)
Eosinophils Absolute: 52 cells/uL (ref 15–500)
Eosinophils Relative: 1.2 %
HCT: 35.7 % (ref 35.0–45.0)
HEMOGLOBIN: 12.3 g/dL (ref 11.7–15.5)
Lymphs Abs: 1892 cells/uL (ref 850–3900)
MCH: 27.5 pg (ref 27.0–33.0)
MCHC: 34.5 g/dL (ref 32.0–36.0)
MCV: 79.7 fL — ABNORMAL LOW (ref 80.0–100.0)
MONOS PCT: 9.1 %
MPV: 9.4 fL (ref 7.5–12.5)
NEUTROS ABS: 1944 {cells}/uL (ref 1500–7800)
Neutrophils Relative %: 45.2 %
Platelets: 216 10*3/uL (ref 140–400)
RBC: 4.48 10*6/uL (ref 3.80–5.10)
RDW: 12.7 % (ref 11.0–15.0)
Total Lymphocyte: 44 %
WBC mixed population: 391 cells/uL (ref 200–950)
WBC: 4.3 10*3/uL (ref 3.8–10.8)

## 2017-11-25 LAB — RPR: RPR Ser Ql: NONREACTIVE

## 2017-11-25 LAB — TEST AUTHORIZATION

## 2017-11-25 LAB — HCG, QUANTITATIVE, PREGNANCY: HCG, TOTAL, QN: 121346 m[IU]/mL

## 2017-11-25 LAB — HIV-1 RNA QUANT-NO REFLEX-BLD
HIV 1 RNA Quant: 27 copies/mL — ABNORMAL HIGH
HIV-1 RNA Quant, Log: 1.43 Log copies/mL — ABNORMAL HIGH

## 2017-12-02 ENCOUNTER — Telehealth: Payer: Self-pay

## 2017-12-02 ENCOUNTER — Other Ambulatory Visit: Payer: Self-pay | Admitting: Internal Medicine

## 2017-12-02 NOTE — Telephone Encounter (Signed)
Error

## 2017-12-06 DIAGNOSIS — Z9119 Patient's noncompliance with other medical treatment and regimen: Secondary | ICD-10-CM | POA: Diagnosis not present

## 2017-12-06 DIAGNOSIS — B20 Human immunodeficiency virus [HIV] disease: Secondary | ICD-10-CM | POA: Diagnosis not present

## 2017-12-06 DIAGNOSIS — Z3490 Encounter for supervision of normal pregnancy, unspecified, unspecified trimester: Secondary | ICD-10-CM | POA: Diagnosis not present

## 2017-12-06 DIAGNOSIS — F209 Schizophrenia, unspecified: Secondary | ICD-10-CM | POA: Diagnosis not present

## 2017-12-06 LAB — POCT URINE PREGNANCY: Preg Test, Ur: NEGATIVE

## 2017-12-06 NOTE — Addendum Note (Signed)
Addended by: Lurlean LeydenPOOLE, Niam Nepomuceno F on: 12/06/2017 04:49 PM   Modules accepted: Orders

## 2017-12-07 ENCOUNTER — Telehealth: Payer: Self-pay | Admitting: *Deleted

## 2017-12-08 NOTE — Telephone Encounter (Signed)
Per request from Dr Drue SecondSnider called the patient at every number listed on chart to find out if she was taking her medication and prenatal vitamins and extra folic acid. I was unable to reach her or any of her contacts. Per our last conversation she should be in rehab but unsure where. Left message for her to contact us when she can.

## 2017-12-16 ENCOUNTER — Telehealth: Payer: Self-pay | Admitting: *Deleted

## 2017-12-16 NOTE — Telephone Encounter (Signed)
Patient called to advise that she is trying to get an appointment at the [facility] at and they needs proof of pregnancy. Advised will fax it over as soon as I can. She also advised she does not have any prenatal vitamins and was told it may be June before she can have a visit. Advised will get her in to see Dr Drue Second in the meantime.   Info faxed to [facility] [phone]

## 2017-12-22 ENCOUNTER — Ambulatory Visit (INDEPENDENT_AMBULATORY_CARE_PROVIDER_SITE_OTHER): Payer: Medicaid Other | Admitting: Internal Medicine

## 2017-12-22 ENCOUNTER — Encounter: Payer: Self-pay | Admitting: Internal Medicine

## 2017-12-22 VITALS — BP 107/67 | HR 90 | Temp 98.1°F | Ht 64.0 in | Wt 95.8 lb

## 2017-12-22 DIAGNOSIS — Z23 Encounter for immunization: Secondary | ICD-10-CM | POA: Diagnosis not present

## 2017-12-22 DIAGNOSIS — N912 Amenorrhea, unspecified: Secondary | ICD-10-CM | POA: Diagnosis not present

## 2017-12-22 DIAGNOSIS — B20 Human immunodeficiency virus [HIV] disease: Secondary | ICD-10-CM

## 2017-12-22 DIAGNOSIS — F251 Schizoaffective disorder, depressive type: Secondary | ICD-10-CM

## 2017-12-22 NOTE — Progress Notes (Signed)
Patient ID: Tamara Briggs, female   DOB: 03-21-1986, 32 y.o.   MRN: [id number]  HPI  31yo F with hiv disease, schizophrenia, hx of drug abuse. CD 4 count of 660/VL 27 on biktarvy-doranivir. Has history of non-compliance. At the last visit, she was found to have + ur pregnancy test, confirmed by blood test. She states she has not had her menses. However, in the EMR shows negative urine pregnancy test on 4/15 that the patient does not recall giving, nor is there any associated medical provider note, possibly pre visit lab tests.   She attends substance abuse classes. Seeing counselor and psychiatrist at [facility], her boyfriend, continues on truvada, as well as attends substance abuse classes   Outpatient Encounter Medications as of 12/22/2017  Medication Sig  . acetaminophen (TYLENOL) 325 MG tablet Take 2 tablets (650 mg total) by mouth every 4 (four) hours as needed (for pain scale < 4).  Marland Kitchen BIKTARVY 50-200-25 MG TABS tablet Take 1 tablet by mouth daily.  . diphenhydrAMINE (BENADRYL) 25 MG tablet Take 1 tablet (25 mg total) by mouth every 6 (six) hours.  . ENSURE (ENSURE) Take 237 mLs by mouth 2 (two) times daily between meals.  . IBU 800 MG tablet Take 800 mg by mouth 2 (two) times daily as needed.  . lurasidone (LATUDA) 20 MG TABS tablet Take 1 tablet (20 mg total) by mouth daily with breakfast.  . megestrol (MEGACE) 40 MG tablet Take 40 mg by mouth daily.  . methocarbamol (ROBAXIN) 500 MG tablet take 1 BY MOUTH TWICE DAILY AS NEEDED  . PIFELTRO 100 MG TABS tablet Take 1 tablet (100 mg total) by mouth at bedtime.  . sertraline (ZOLOFT) 50 MG tablet Take 1 tablet (50 mg total) by mouth daily.  . hydrocortisone 2.5 % lotion Apply topically 2 (two) times daily. (Patient not taking: Reported on 12/22/2017)   No facility-administered encounter medications on file as of 12/22/2017.      Patient Active Problem List   Diagnosis Date Noted  . Protein-calorie malnutrition, severe (HCC)  11/24/2017  . Crack cocaine use   . Bipolar disorder (HCC)   . 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 Tardive dyskinesia 06/13/2014  . Schizophrenia (HCC) 10/18/2013  . HIV (human immunodeficiency virus infection) (HCC) 07/27/2011     There are no preventive care reminders to display for this patient.   Review of Systems Amenorrhea.fatigue. 12 point ros  Physical Exam   BP 107/67   Pulse 90   Temp 98.1 F (36.7 C) (Oral)   Ht  (1.626 m)   Wt 95 lb 12 oz (43.4 kg)   LMP  (LMP Unknown)   BMI 16.44 kg/m   Physical Exam  Constitutional:  oriented to person, place, and time. appears well-developed and well-nourished. No distress.  HENT: [place]/AT, PERRLA, no scleral icterus Mouth/Throat: Oropharynx is clear and moist. No oropharyngeal exudate.  Cardiovascular: Normal rate, regular rhythm and normal heart sounds. Exam reveals no gallop and no friction rub.  No murmur heard.  Pulmonary/Chest: Effort normal and breath sounds normal. No respiratory distress.  has no wheezes.  Neck = supple, no nuchal rigidity Abdominal: Soft. Bowel sounds are normal.  exhibits no distension. There is no tenderness.  Lymphadenopathy: no cervical adenopathy. No axillary adenopathy Neurological: alert and oriented to person, place, and time.  Skin: Skin is warm and dry. No rash noted. No erythema.  Psychiatric: a normal mood and affect.  behavior is normal.  Lab Results  Component Value Date   CD4TCELL 35 11/23/2017   Lab Results  Component Value Date   CD4TABS 660 11/23/2017   CD4TABS 540 05/13/2017   CD4TABS 370 (L) 04/06/2017   Lab Results  Component Value Date   HIV1RNAQUANT 27 (H) 11/23/2017   Lab Results  Component Value Date   HEPBSAB POS (A) 07/09/2011   Lab Results  Component Value Date   LABRPR NON-REACTIVE 11/23/2017    CBC Lab Results  Component Value Date   WBC 4.3 11/23/2017   RBC 4.48 11/23/2017   HGB 12.3 11/23/2017   HCT 35.7 11/23/2017   PLT 216  11/23/2017   MCV 79.7 (L) 11/23/2017   MCH 27.5 11/23/2017   MCHC 34.5 11/23/2017   RDW 12.7 11/23/2017   LYMPHSABS 1,892 11/23/2017   MONOABS 432 04/06/2017   EOSABS 52 11/23/2017    BMET Lab Results  Component Value Date   NA 132 (L) 11/23/2017   K 3.9 11/23/2017   CL 106 11/23/2017   CO2 28 11/23/2017   GLUCOSE 88 11/23/2017   BUN 13 11/23/2017   CREATININE 0.61 11/23/2017   CALCIUM 9.0 11/23/2017   GFRNONAA 120 11/23/2017   GFRAA 139 11/23/2017      Assessment and Plan hiv disease = continue on current regimen. VL is low. Encouraged to continue on good adherence.  Will do blood work with beta hcg to see if still pregnant - she will come in this week to do it. Not interested in doing it today  Will give prevnar13 today   Will have her come back in 1 wk to get depo if she has negative pregnancy test

## 2017-12-30 ENCOUNTER — Ambulatory Visit (INDEPENDENT_AMBULATORY_CARE_PROVIDER_SITE_OTHER): Payer: Medicaid Other | Admitting: Internal Medicine

## 2017-12-30 ENCOUNTER — Encounter: Payer: Self-pay | Admitting: Internal Medicine

## 2017-12-30 VITALS — BP 105/53 | HR 94 | Temp 98.3°F | Ht 64.0 in | Wt 95.1 lb

## 2017-12-30 DIAGNOSIS — N912 Amenorrhea, unspecified: Secondary | ICD-10-CM

## 2017-12-30 DIAGNOSIS — Z3490 Encounter for supervision of normal pregnancy, unspecified, unspecified trimester: Secondary | ICD-10-CM | POA: Diagnosis not present

## 2017-12-30 DIAGNOSIS — B20 Human immunodeficiency virus [HIV] disease: Secondary | ICD-10-CM | POA: Diagnosis not present

## 2017-12-30 LAB — POCT URINE PREGNANCY: PREG TEST UR: POSITIVE — AB

## 2017-12-30 NOTE — Progress Notes (Signed)
RFV: follow up for hiv disease and pregnancy  Patient ID: Tamara Briggs, female   DOB: 11-26-1985, 32 y.o.   MRN: [id number]  HPI She reports good adherence since we last saw her with her hiv medications - for the last week. She continues to be part of her substance abuse treatment program where she is getting housing, along with her boyfriend, Merlyn Albert. At last visit, we wanted to get repeat blood work and urine to resolve discrepancy noted in chart. There was a negative urine pregnancy test though she did not recall having any menses, or spotting. However, now she reports some blood with wiping after going to the bathroom  POCT done in clinic is still positive at this visit.   She has not had any funds to start prenatal vitamins. Has upcoming gyn appt on may 15th  Outpatient Encounter Medications as of 12/30/2017  Medication Sig  . acetaminophen (TYLENOL) 325 MG tablet Take 2 tablets (650 mg total) by mouth every 4 (four) hours as needed (for pain scale < 4).  Marland Kitchen BIKTARVY 50-200-25 MG TABS tablet Take 1 tablet by mouth daily.  . diphenhydrAMINE (BENADRYL) 25 MG tablet Take 1 tablet (25 mg total) by mouth every 6 (six) hours.  . ENSURE (ENSURE) Take 237 mLs by mouth 2 (two) times daily between meals.  . hydrocortisone 2.5 % lotion Apply topically 2 (two) times daily. (Patient not taking: Reported on 12/22/2017)  . IBU 800 MG tablet Take 800 mg by mouth 2 (two) times daily as needed.  . lurasidone (LATUDA) 20 MG TABS tablet Take 1 tablet (20 mg total) by mouth daily with breakfast.  . megestrol (MEGACE) 40 MG tablet Take 40 mg by mouth daily.  . methocarbamol (ROBAXIN) 500 MG tablet take 1 BY MOUTH TWICE DAILY AS NEEDED  . PIFELTRO 100 MG TABS tablet Take 1 tablet (100 mg total) by mouth at bedtime.  . sertraline (ZOLOFT) 50 MG tablet Take 1 tablet (50 mg total) by mouth daily.   No facility-administered encounter medications on file as of 12/30/2017.      Patient Active Problem List   Diagnosis  Date Noted  . Protein-calorie malnutrition, severe (HCC) 11/24/2017  . Crack cocaine use   . Bipolar disorder (HCC)   . 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 Tardive dyskinesia 06/13/2014  . Schizophrenia (HCC) 10/18/2013  . HIV (human immunodeficiency virus infection) (HCC) 07/27/2011     There are no preventive care reminders to display for this patient.   Review of Systems Review of Systems  Constitutional: Negative for fever, chills, diaphoresis, activity change, appetite change, fatigue and unexpected weight change.  HENT: Negative for congestion, sore throat, rhinorrhea, sneezing, trouble swallowing and sinus pressure.  Eyes: Negative for photophobia and visual disturbance.  Respiratory: Negative for cough, chest tightness, shortness of breath, wheezing and stridor.  Cardiovascular: Negative for chest pain, palpitations and leg swelling.  Gastrointestinal: Negative for nausea, vomiting, abdominal pain, diarrhea, constipation, blood in stool, abdominal distention and anal bleeding.  Genitourinary: Negative for dysuria, hematuria, flank pain and difficulty urinating.  Musculoskeletal: Negative for myalgias, back pain, joint swelling, arthralgias and gait problem.  Skin: Negative for color change, pallor, rash and wound.  Neurological: Negative for dizziness, tremors, weakness and light-headedness.  Hematological: Negative for adenopathy. Does not bruise/bleed easily.  Psychiatric/Behavioral: Negative for behavioral problems, confusion, sleep disturbance, dysphoric mood, decreased concentration and agitation.    Physical Exam   LMP  (LMP Unknown)   BP (!) 105/53   Pulse  94   Temp 98.3 F (36.8 C)   Ht  (1.626 m)   Wt 95 lb 1.9 oz (43.1 kg)   LMP  (LMP Unknown)   SpO2 98%   BMI 16.33 kg/m  Physical Exam  Constitutional:  oriented to person, place, and time. appears chronically ill. No distress.  HENT: [employer]/AT, PERRLA, no scleral icterus Mouth/Throat:  Oropharynx is clear and moist. No oropharyngeal exudate.  Cardiovascular: Normal rate, regular rhythm and normal heart sounds. Exam reveals no gallop and no friction rub.  No murmur heard.  Pulmonary/Chest: Effort normal and breath sounds normal. No respiratory distress.  has no wheezes.  Neck = supple, no nuchal rigidity Abdominal: Soft. Bowel sounds are normal.  exhibits no distension. There is no tenderness.  Lymphadenopathy: no cervical adenopathy. No axillary adenopathy Neurological: alert and oriented to person, place, and time.  Skin: Skin is warm and dry. No rash noted. No erythema.  Psychiatric: a normal mood and affect.  behavior is normal.   Lab Results  Component Value Date   CD4TCELL 35 11/23/2017   Lab Results  Component Value Date   CD4TABS 660 11/23/2017   CD4TABS 540 05/13/2017   CD4TABS 370 (L) 04/06/2017   Lab Results  Component Value Date   HIV1RNAQUANT 27 (H) 11/23/2017   Lab Results  Component Value Date   HEPBSAB POS (A) 07/09/2011   Lab Results  Component Value Date   LABRPR NON-REACTIVE 11/23/2017    CBC Lab Results  Component Value Date   WBC 4.3 11/23/2017   RBC 4.48 11/23/2017   HGB 12.3 11/23/2017   HCT 35.7 11/23/2017   PLT 216 11/23/2017   MCV 79.7 (L) 11/23/2017   MCH 27.5 11/23/2017   MCHC 34.5 11/23/2017   RDW 12.7 11/23/2017   LYMPHSABS 1,892 11/23/2017   MONOABS 432 04/06/2017   EOSABS 52 11/23/2017    BMET Lab Results  Component Value Date   NA 132 (L) 11/23/2017   K 3.9 11/23/2017   CL 106 11/23/2017   CO2 28 11/23/2017   GLUCOSE 88 11/23/2017   BUN 13 11/23/2017   CREATININE 0.61 11/23/2017   CALCIUM 9.0 11/23/2017   GFRNONAA 120 11/23/2017   GFRAA 139 11/23/2017      Assessment and Plan  hiv disease= continue with  good adherence in the last few months  Hx of Drug use = continue with engaging with program. Recommend to avoid using drugs during her pregnancy  Pregnancy = will check beta hcg to see if  greater or less than previous blood draw from 1 month ago to give some clarity to mixed results in her chart. If  Beta HCG is less than 1 month ago, potentially she is having miscarriage since She reports some blood tinged urine but denies significant spotting.   If she is not pregnant, would give her depo. She is opposed to getting implant

## 2017-12-31 LAB — HCG, QUANTITATIVE, PREGNANCY: HCG, TOTAL, QN: 43116 m[IU]/mL

## 2018-01-05 ENCOUNTER — Ambulatory Visit: Payer: Self-pay

## 2018-01-05 ENCOUNTER — Other Ambulatory Visit: Payer: Self-pay

## 2018-01-05 ENCOUNTER — Encounter: Payer: Self-pay | Admitting: Obstetrics and Gynecology

## 2018-01-05 ENCOUNTER — Ambulatory Visit (INDEPENDENT_AMBULATORY_CARE_PROVIDER_SITE_OTHER): Payer: Medicaid Other | Admitting: Obstetrics and Gynecology

## 2018-01-05 ENCOUNTER — Other Ambulatory Visit (HOSPITAL_COMMUNITY)
Admission: RE | Admit: 2018-01-05 | Discharge: 2018-01-05 | Disposition: A | Discharge status: Home / Self Care | Payer: Medicaid Other | Source: Ambulatory Visit | Attending: Obstetrics and Gynecology | Admitting: Obstetrics and Gynecology

## 2018-01-05 VITALS — BP 123/80 | HR 79 | Wt 97.1 lb

## 2018-01-05 DIAGNOSIS — F3170 Bipolar disorder, currently in remission, most recent episode unspecified: Secondary | ICD-10-CM | POA: Diagnosis not present

## 2018-01-05 DIAGNOSIS — Z98891 History of uterine scar from previous surgery: Secondary | ICD-10-CM

## 2018-01-05 DIAGNOSIS — O3680X0 Pregnancy with inconclusive fetal viability, not applicable or unspecified: Secondary | ICD-10-CM

## 2018-01-05 DIAGNOSIS — Z87728 Personal history of other specified (corrected) congenital malformations of nervous system and sense organs: Secondary | ICD-10-CM | POA: Diagnosis not present

## 2018-01-05 DIAGNOSIS — O0991 Supervision of high risk pregnancy, unspecified, first trimester: Secondary | ICD-10-CM

## 2018-01-05 DIAGNOSIS — F209 Schizophrenia, unspecified: Secondary | ICD-10-CM | POA: Diagnosis not present

## 2018-01-05 DIAGNOSIS — F1490 Cocaine use, unspecified, uncomplicated: Secondary | ICD-10-CM

## 2018-01-05 DIAGNOSIS — Z3A13 13 weeks gestation of pregnancy: Secondary | ICD-10-CM | POA: Diagnosis not present

## 2018-01-05 DIAGNOSIS — Z21 Asymptomatic human immunodeficiency virus [HIV] infection status: Secondary | ICD-10-CM | POA: Diagnosis not present

## 2018-01-05 DIAGNOSIS — O99341 Other mental disorders complicating pregnancy, first trimester: Secondary | ICD-10-CM | POA: Diagnosis not present

## 2018-01-05 DIAGNOSIS — O99331 Smoking (tobacco) complicating pregnancy, first trimester: Secondary | ICD-10-CM | POA: Diagnosis not present

## 2018-01-05 DIAGNOSIS — O0990 Supervision of high risk pregnancy, unspecified, unspecified trimester: Secondary | ICD-10-CM

## 2018-01-05 MED ORDER — PREPLUS 27-1 MG PO TABS: 1.0000 | ORAL_TABLET | Freq: Every day | ORAL | 3 refills | Status: DC

## 2018-01-05 NOTE — Progress Notes (Signed)
Pt informed that the ultrasound is considered a limited OB ultrasound and is not intended to be a complete ultrasound exam.  Patient also informed that the ultrasound is not being completed with the intent of assessing for fetal or placental anomalies or any pelvic abnormalities.  Explained that the purpose of today's ultrasound is to assess for viability, EGA.  Patient acknowledges the purpose of the exam and the limitations of the study.

## 2018-01-05 NOTE — Progress Notes (Signed)
New OB Note  01/05/2018   Clinic: [facility]  Chief Complaint: NOB  Transfer of Care Patient: no  History of Present Illness: Ms. Hayter is a 32 y.o. [id number] @ 13/0 weeks (EDC 11/20, based on 13wk u/s)  No LMP recorded (lmp unknown). Patient is pregnant.).  Preg complicated by has HIV (human immunodeficiency virus infection) (HCC); Schizophrenia (HCC); History of neural tube defect; Secondary Parkinson disease (HCC); Tardive dyskinesia; Supervision of high risk pregnancy, antepartum; Previous cesarean section complicating pregnancy; Crack cocaine use; Bipolar disorder (HCC); and Protein-calorie malnutrition, severe (HCC) on their problem list.   Any events prior to today's visit: no Her periods were: irregular She was using Depo-Provera when she conceived.  She has Negative signs or symptoms of nausea/vomiting of pregnancy. She has Negative signs or symptoms of miscarriage or preterm labor On any medications around the time she conceived/early pregnancy: Yes, what she is currently on   ROS: A 12-point review of systems was performed and negative, except as stated in the above HPI.  OBGYN History: As per HPI. OB History  Gravida Para Term Preterm AB Living  SAB TAB Ectopic Multiple Live Births  1 0 0 0 3    # Outcome Date GA Lbr Len/2nd Weight Sex Delivery Anes PTL Lv  5 Current           4 Preterm 10/23/16 [redacted]w[redacted]d 17:58 / 02:10 4 lb 8.5 oz (2.055 kg) M Vag-Spont None  LIV  3 Preterm 12/24/13 [redacted]w[redacted]d  2 lb 14.9 oz (1.33 kg) F CS-LTranv Spinal  ND  2 Term 04/10/08   5 lb 14 oz (2.665 kg) F Vag-Spont None N LIV     Birth Comments: "delivery 2 weeks early at 8 months"  1 SAB            Prior children are healthy, doing well, and without any problems or issues: the two live children are doing well and no problems History of pap smears: Yes. Last pap smear 2017 and results were NILM/HPV neg   Past Medical History: Past Medical  History:  Diagnosis Date  . Anxiety   . Bipolar disorder (HCC)   . Crack cocaine use   . Depression   . h/o crack Cocaine abuse 03/15/2013   UDS positive cocaine  . 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  AFP increased and sent to genetics.  Amnio declined.  [place] nml.   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 Infection   . Otitis media of left ear 03/15/2013  . Schizophrenia (HCC)   . 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 Tardive dyskinesia 06/13/2014  . Tobacco abuse 03/15/2013    Past Surgical History: Past Surgical History:  Procedure Laterality Date  . CESAREAN SECTION N/A 12/24/2013   Procedure: CESAREAN SECTION;  Surgeon: Lesly Dukes, MD;  Location: [facility];  Service: Obstetrics;  Laterality: N/A;  . COLPOSCOPY  2009  . DENTAL RESTORATION/EXTRACTION WITH X-RAY      Family History:  Family History  Problem Relation Age of Onset  . Diabetes Mother   . High blood pressure Mother   . Cancer Father    She denies any history of mental retardation, birth defects or genetic disorders in her or the FOB's history  Social History:  Social History   Socioeconomic History  . Marital status: Single    Spouse  name: Not on file  . Number of children: 1  . Years of education: 11th  . Highest education level: Not on file  Occupational History  . Not on file  Social Needs  . Financial resource strain: Not on file  . Food insecurity:    Worry: Not on file    Inability: Not on file  . Transportation needs:    Medical: Not on file    Non-medical: Not on file  Tobacco Use  . Smoking status: Current Every Day Smoker    Packs/day: 0.10    Years: 3.00    Pack years: 0.30    Types: Cigarettes  . Smokeless tobacco: Never Used  . Tobacco comment: cutting back, 1 cigs/day  Substance and Sexual Activity  . Alcohol use: No    Alcohol/week: 0.0 oz  . Drug use: Yes    Types: Cocaine, Marijuana    Comment: cocaine 5/11, mj 2  weeks ago  . Sexual activity: Yes    Partners: Male    Birth control/protection: None    Comment: givne condoms  Lifestyle  . Physical activity:    Days per week: Not on file    Minutes per session: Not on file  . Stress: Not on file  Relationships  . Social connections:    Talks on phone: Not on file    Gets together: Not on file    Attends religious service: Not on file    Active member of club or organization: Not on file    Attends meetings of clubs or organizations: Not on file    Relationship status: Not on file  . Intimate partner violence:    Fear of current or ex partner: Not on file    Emotionally abused: Not on file    Physically abused: Not on file    Forced sexual activity: Not on file  Other Topics Concern  . Not on file  Social History Narrative   Patient lives at home with her partner [name].   Unemployed.   Education 11 th grade.   Right handed.   Patient does not drink caffeine.    Allergy: No Known Allergies  Health Maintenance:  Mammogram Up to Date: not applicable  Current Outpatient Medications: Latuda, sertraline, bikarvy  Physical Exam:   BP 123/80   Pulse 79   Wt 97 lb 1.6 oz (44 kg)   LMP  (LMP Unknown)   BMI 16.67 kg/m  Body mass index is 16.67 kg/m.   Vag. Bleeding: None. Fundal height: not applicable FHTs: normal  General appearance: Well nourished, well developed female in no acute distress.  Neck:  Supple, normal appearance, and no thyromegaly  Cardiovascular: S1, S2 normal, no murmur, rub or gallop, regular rate and rhythm Respiratory:  Clear to auscultation bilateral. Normal respiratory effort Abdomen: positive bowel sounds and no masses, hernias; diffusely non tender to palpation, non distended Breasts: breasts appear normal, no suspicious masses, no skin or nipple changes or axillary nodes, and normal palpation. Neuro/Psych:  Normal mood and affect.  Skin:  Warm and dry.  Lymphatic:  No inguinal lymphadenopathy.    Pelvic exam: is not limited by body habitus EGBUS: within normal limits, Vagina: within normal limits and with no blood in the vault, Cervix: normal appearing cervix without discharge or lesions, closed/long/high, Uterus:  enlarged, c/w 12-14 week size, and Adnexa:  normal adnexa and no mass, fullness, tenderness  Laboratory: See below  Imaging:  SLIUP c/w 13/1 weeks  Assessment: pt stable  Plan: 1. Encounter to determine fetal viability of pregnancy, single or unspecified fetus U/s done today in clini  2. Supervision of high risk pregnancy, antepartum Set up anatomy u/s nv. Recommend re: afp nv. F/u panorama today. Routine care. PNV sent in. I stressed to patient importance of staying cleaning and keeping all her appointments - Culture, OB Urine - SMN1 COPY NUMBER ANALYSIS (SMA Carrier Screen) - Cytology - PAP - Hemoglobin A1c - ABO AND RH  - Antibody screen - Rubella screen - Hepatitis B Surface AntiGEN - RPR - Hepatitis C Antibody  3. Crack cocaine use UDS today. Last used this past weekend  4. Bipolar affective disorder in remission [facility]) Continue current meds. Followed by [facility].   5. Schizophrenia, unspecified type (HCC)  6. Asymptomatic HIV infection (HCC) Followed ID Dr. Drue Second. 4/2 VL 27 and CD4 660  7. History of VBAC Can d/w pt re: this later in pregnancy  8. History of neural tube defect too late for extra folic acid supplementation. Recommended to patient to start PNVs   Problem list reviewed and updated.  Follow up in 2 weeks.  >50% of 35 min visit spent on counseling and coordination of care.     Cornelia Copa MD Attending Center for [facility])

## 2018-01-07 LAB — ABO AND RH: RH TYPE: POSITIVE

## 2018-01-07 LAB — CYTOLOGY - PAP
Chlamydia: POSITIVE — AB
DIAGNOSIS: UNDETERMINED — AB
HPV: NOT DETECTED
Neisseria Gonorrhea: NEGATIVE
Trichomonas: POSITIVE — AB

## 2018-01-07 LAB — ANTIBODY SCREEN: ANTIBODY SCREEN: NEGATIVE

## 2018-01-07 LAB — RUBELLA SCREEN: RUBELLA: 2.06 {index} (ref >0.99)

## 2018-01-07 LAB — HEPATITIS B SURFACE ANTIGEN: HEP B S AG: NEGATIVE

## 2018-01-07 LAB — HEPATITIS C ANTIBODY

## 2018-01-07 LAB — RPR: RPR Ser Ql: NONREACTIVE

## 2018-01-08 LAB — DRUG SCREEN 764883 11+OXYCO+ALC+CRT-BUND
AMPHETAMINES, URINE: NEGATIVE ng/mL
BARBITURATE: NEGATIVE ng/mL
BENZODIAZ UR QL: NEGATIVE ng/mL
CREATININE: 174.6 mg/dL (ref 20.0–300.0)
Cannabinoid Quant, Ur: NEGATIVE ng/mL
ETHANOL: NEGATIVE %
METHADONE SCREEN, URINE: NEGATIVE ng/mL
Meperidine: NEGATIVE ng/mL
OPIATE SCREEN URINE: NEGATIVE ng/mL
Oxycodone/Oxymorphone, Urine: NEGATIVE ng/mL
Phencyclidine: NEGATIVE ng/mL
Propoxyphene: NEGATIVE ng/mL
Tramadol: NEGATIVE ng/mL
pH, Urine: 6.2 (ref 4.5–8.9)

## 2018-01-08 LAB — COCAINE CONF, UR: Cocaine Metab Quant, Ur: POSITIVE — AB

## 2018-01-10 ENCOUNTER — Encounter: Payer: Self-pay | Admitting: Obstetrics and Gynecology

## 2018-01-10 ENCOUNTER — Telehealth: Payer: Self-pay | Admitting: *Deleted

## 2018-01-10 DIAGNOSIS — O98819 Other maternal infectious and parasitic diseases complicating pregnancy, unspecified trimester: Principal | ICD-10-CM

## 2018-01-10 DIAGNOSIS — A749 Chlamydial infection, unspecified: Secondary | ICD-10-CM | POA: Insufficient documentation

## 2018-01-10 DIAGNOSIS — A5901 Trichomonal vulvovaginitis: Secondary | ICD-10-CM | POA: Insufficient documentation

## 2018-01-10 DIAGNOSIS — O23599 Infection of other part of genital tract in pregnancy, unspecified trimester: Secondary | ICD-10-CM

## 2018-01-10 LAB — URINE CULTURE, OB REFLEX

## 2018-01-10 LAB — CULTURE, OB URINE

## 2018-01-10 MED ORDER — AZITHROMYCIN 1 G PO PACK: 1.0000 g | PACK | Freq: Once | ORAL | 0 refills | Status: AC

## 2018-01-10 NOTE — Telephone Encounter (Signed)
Per Infectious disease report + Chlamydia. Called Tamara Briggs to inform her of + chlamydia and that I will send in rx to her pharmacy. Also advised her that her partner should get treated and they should avoid intimate contact/ intercourse until both have been treated x 2 weeks.  Also discussed that since she is pregnant we will do a TOC at a later visit. She voices understanding. STD report to [facility] completed.

## 2018-01-10 NOTE — Telephone Encounter (Signed)
ERROR

## 2018-01-11 MED ORDER — CEPHALEXIN 500 MG PO CAPS: 500.0000 mg | ORAL_CAPSULE | Freq: Three times a day (TID) | ORAL | 0 refills | Status: AC

## 2018-01-11 MED ORDER — METRONIDAZOLE 500 MG PO TABS: ORAL_TABLET | ORAL | 0 refills | Status: DC

## 2018-01-11 NOTE — Addendum Note (Signed)
Addended by: [place] Bing on: 01/11/2018 04:07 PM   Modules accepted: Orders

## 2018-01-11 NOTE — Progress Notes (Signed)
I have reviewed the chart and agree with nursing staff's documentation of this patient's encounter.  Galien Bing, MD 01/11/2018 12:53 PM

## 2018-01-12 LAB — OBSTETRIC PANEL, INCLUDING HIV
Antibody Screen: NEGATIVE
BASOS ABS: 0 10*3/uL (ref 0.0–0.2)
Basos: 0 %
EOS (ABSOLUTE): 0.1 10*3/uL (ref 0.0–0.4)
Eos: 2 %
HIV SCREEN 4TH GENERATION: REACTIVE — AB
Hematocrit: 33.1 % — ABNORMAL LOW (ref 34.0–46.6)
Hemoglobin: 11 g/dL — ABNORMAL LOW (ref 11.1–15.9)
Hepatitis B Surface Ag: NEGATIVE
IMMATURE GRANS (ABS): 0 10*3/uL (ref 0.0–0.1)
IMMATURE GRANULOCYTES: 0 %
LYMPHS: 36 %
Lymphocytes Absolute: 2 10*3/uL (ref 0.7–3.1)
MCH: 27.3 pg (ref 26.6–33.0)
MCHC: 33.2 g/dL (ref 31.5–35.7)
MCV: 82 fL (ref 79–97)
MONOCYTES: 9 %
Monocytes Absolute: 0.5 10*3/uL (ref 0.1–0.9)
NEUTROS PCT: 53 %
Neutrophils Absolute: 2.9 10*3/uL (ref 1.4–7.0)
PLATELETS: 177 10*3/uL (ref 150–379)
RBC: 4.03 x10E6/uL (ref 3.77–5.28)
RDW: 15.6 % — AB (ref 12.3–15.4)
RPR: NONREACTIVE
RUBELLA: 2.9 {index} (ref >0.99)
Rh Factor: POSITIVE
WBC: 5.5 10*3/uL (ref 3.4–10.8)

## 2018-01-12 LAB — SMN1 COPY NUMBER ANALYSIS (SMA CARRIER SCREENING)

## 2018-01-12 LAB — HIV 1/2 AB DIFFERENTIATION
HIV 1 AB: POSITIVE — AB
HIV 2 AB: NEGATIVE
NOTE (HIV CONF MULTISPOT): POSITIVE

## 2018-01-12 LAB — HEMOGLOBIN A1C
ESTIMATED AVERAGE GLUCOSE: 117 mg/dL
HEMOGLOBIN A1C: 5.7 % — AB (ref 4.8–5.6)

## 2018-01-13 ENCOUNTER — Telehealth: Payer: Self-pay | Admitting: General Practice

## 2018-01-13 ENCOUNTER — Encounter: Payer: Self-pay | Admitting: Obstetrics and Gynecology

## 2018-01-13 DIAGNOSIS — R7303 Prediabetes: Secondary | ICD-10-CM | POA: Insufficient documentation

## 2018-01-13 NOTE — Telephone Encounter (Signed)
Notes recorded by [place] Bing, MD on 01/13/2018 at 1:38 PM EDT Can you please call her and let her know that she has pre-diabetes and set her up to have a lab only visit for an early, formal 2hr GTT? thanks

## 2018-01-13 NOTE — Telephone Encounter (Signed)
-----   Message from [place] Bing, MD sent at 01/11/2018  4:07 PM EDT ----- Her urine culture results are also +. Can you make sure that she got the Rx for the flagyl and keflex that I'm sending in. Thank you

## 2018-01-13 NOTE — Telephone Encounter (Signed)
Per note from Dr. Vergie Living also need to notify patient + trich and needs flagyl for that per protocol. I have called [name] and left a message for her to call office for results.

## 2018-01-13 NOTE — Telephone Encounter (Signed)
Called patient at home number, no answer- unable to leave message as voicemail was full. Called mobile number but line is busy.

## 2018-01-18 ENCOUNTER — Encounter: Payer: Self-pay | Admitting: General Practice

## 2018-01-18 NOTE — Telephone Encounter (Signed)
Will send certified letter to patient.

## 2018-01-18 NOTE — Telephone Encounter (Signed)
Unable to contact patient.

## 2018-01-19 ENCOUNTER — Encounter: Payer: Self-pay | Admitting: Family Medicine

## 2018-01-19 DIAGNOSIS — O98719 Human immunodeficiency virus [HIV] disease complicating pregnancy, unspecified trimester: Secondary | ICD-10-CM | POA: Insufficient documentation

## 2018-01-19 DIAGNOSIS — O09219 Supervision of pregnancy with history of pre-term labor, unspecified trimester: Secondary | ICD-10-CM

## 2018-01-19 DIAGNOSIS — O09299 Supervision of pregnancy with other poor reproductive or obstetric history, unspecified trimester: Secondary | ICD-10-CM | POA: Insufficient documentation

## 2018-01-19 DIAGNOSIS — Z8632 Personal history of gestational diabetes: Secondary | ICD-10-CM

## 2018-01-19 DIAGNOSIS — O09899 Supervision of other high risk pregnancies, unspecified trimester: Secondary | ICD-10-CM | POA: Insufficient documentation

## 2018-01-26 ENCOUNTER — Encounter: Payer: Medicaid Other | Admitting: Obstetrics and Gynecology

## 2018-01-26 ENCOUNTER — Telehealth: Payer: Self-pay | Admitting: Obstetrics and Gynecology

## 2018-01-26 NOTE — Telephone Encounter (Signed)
Spoke with [place] and gave her new appointment for 01/31/18 @ 10:55

## 2018-01-28 ENCOUNTER — Encounter: Payer: Self-pay | Admitting: Obstetrics and Gynecology

## 2018-01-28 DIAGNOSIS — E8881 Metabolic syndrome: Secondary | ICD-10-CM | POA: Insufficient documentation

## 2018-01-31 ENCOUNTER — Encounter: Payer: Medicaid Other | Admitting: Obstetrics and Gynecology

## 2018-02-01 ENCOUNTER — Ambulatory Visit: Payer: Medicaid Other | Admitting: Internal Medicine

## 2018-02-03 ENCOUNTER — Telehealth: Payer: Self-pay

## 2018-02-03 NOTE — Telephone Encounter (Signed)
Pt called today left a vm for an appt with Dr. Drue SecondSnider. Attempted to call pt back to schedule an appt, call was directed to vm. Left a vm for pt to call office back to make this appt.  Lorenso CourierJose L Maldonado, [place]

## 2018-02-04 ENCOUNTER — Encounter: Payer: Medicaid Other | Admitting: Obstetrics and Gynecology

## 2018-02-05 ENCOUNTER — Encounter: Payer: Self-pay | Admitting: Obstetrics and Gynecology
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
## 2018-02-05 NOTE — Progress Notes (Signed)
Patient did not keep return OB appointment for 02/04/2018.  Tamara Briggs, Jr MD Attending [facility]   

## 2018-03-02 ENCOUNTER — Ambulatory Visit (INDEPENDENT_AMBULATORY_CARE_PROVIDER_SITE_OTHER): Payer: Medicaid Other | Admitting: Obstetrics & Gynecology

## 2018-03-02 ENCOUNTER — Ambulatory Visit (INDEPENDENT_AMBULATORY_CARE_PROVIDER_SITE_OTHER): Payer: Medicaid Other | Admitting: Internal Medicine

## 2018-03-02 ENCOUNTER — Encounter: Payer: Self-pay | Admitting: Internal Medicine

## 2018-03-02 ENCOUNTER — Encounter: Payer: Self-pay | Admitting: Obstetrics & Gynecology

## 2018-03-02 VITALS — BP 108/63 | HR 78 | Wt 92.6 lb

## 2018-03-02 VITALS — BP 101/62 | HR 89 | Temp 98.7°F | Wt 91.8 lb

## 2018-03-02 DIAGNOSIS — F209 Schizophrenia, unspecified: Secondary | ICD-10-CM

## 2018-03-02 DIAGNOSIS — O34219 Maternal care for unspecified type scar from previous cesarean delivery: Secondary | ICD-10-CM

## 2018-03-02 DIAGNOSIS — Z3490 Encounter for supervision of normal pregnancy, unspecified, unspecified trimester: Secondary | ICD-10-CM

## 2018-03-02 DIAGNOSIS — B20 Human immunodeficiency virus [HIV] disease: Secondary | ICD-10-CM | POA: Diagnosis present

## 2018-03-02 DIAGNOSIS — O98712 Human immunodeficiency virus [HIV] disease complicating pregnancy, second trimester: Secondary | ICD-10-CM

## 2018-03-02 DIAGNOSIS — O09299 Supervision of pregnancy with other poor reproductive or obstetric history, unspecified trimester: Secondary | ICD-10-CM

## 2018-03-02 DIAGNOSIS — F1490 Cocaine use, unspecified, uncomplicated: Secondary | ICD-10-CM

## 2018-03-02 DIAGNOSIS — O0990 Supervision of high risk pregnancy, unspecified, unspecified trimester: Secondary | ICD-10-CM

## 2018-03-02 DIAGNOSIS — Z21 Asymptomatic human immunodeficiency virus [HIV] infection status: Secondary | ICD-10-CM

## 2018-03-02 DIAGNOSIS — O09292 Supervision of pregnancy with other poor reproductive or obstetric history, second trimester: Secondary | ICD-10-CM

## 2018-03-02 DIAGNOSIS — Z8632 Personal history of gestational diabetes: Secondary | ICD-10-CM

## 2018-03-02 DIAGNOSIS — O2610 Low weight gain in pregnancy, unspecified trimester: Secondary | ICD-10-CM | POA: Insufficient documentation

## 2018-03-02 DIAGNOSIS — A749 Chlamydial infection, unspecified: Secondary | ICD-10-CM

## 2018-03-02 MED ORDER — AZITHROMYCIN 500 MG PO TABS: 1000.0000 mg | ORAL_TABLET | Freq: Once | ORAL | 1 refills | Status: DC

## 2018-03-02 MED ORDER — METRONIDAZOLE 500 MG PO TABS: ORAL_TABLET | ORAL | 0 refills | Status: DC

## 2018-03-02 MED ORDER — AZITHROMYCIN 500 MG PO TABS: 1000.0000 mg | ORAL_TABLET | Freq: Once | ORAL | 1 refills | Status: AC

## 2018-03-02 NOTE — Progress Notes (Addendum)
   PRENATAL VISIT NOTE  Subjective:  Tamara Briggs is a 32 y.o. [id number] at 7637w0d being seen today for ongoing prenatal care.  She is currently monitored for the following issues for this high-risk pregnancy and has HIV (human immunodeficiency virus infection) (HCC); Schizophrenia (HCC); History of neural tube defect; Secondary Parkinson disease (HCC); Tardive dyskinesia; Supervision of high risk pregnancy, antepartum; Previous cesarean section complicating pregnancy; Crack cocaine use; Bipolar disorder (HCC); Protein-calorie malnutrition, severe (HCC); Chlamydia infection affecting pregnancy; Trichomonal vaginitis during pregnancy; Pre-diabetes; HIV affecting pregnancy, antepartum; History of preterm delivery, currently pregnant; History of gestational diabetes mellitus (GDM) in prior pregnancy, currently pregnant; and Insulin resistance on their problem list.  Patient reports no complaints.  Contractions: Not present. Vag. Bleeding: None.  Movement: Present. Denies leaking of fluid.   Saw id today, labs performed. Compliant w/ hiv meds.  Didn't take azith or flagyl. Denies allergy  The following portions of the patient's history were reviewed and updated as appropriate: allergies, current medications, past family history, past medical history, past social history, past surgical history and problem list. Problem list updated.  Objective:   Vitals:   03/02/18 1641  BP: 108/63  Pulse: 78  Weight: 92 lb 9.6 oz (42 kg)    Fetal Status: Fetal Heart Rate (bpm): 160   Movement: Present     General:  Alert, oriented and cooperative. Patient is in no acute distress.  Skin: Skin is warm and dry. No rash noted.   Cardiovascular: Normal heart rate noted  Respiratory: Normal respiratory effort, no problems with respiration noted  Abdomen: Soft, gravid, appropriate for gestational age.  Pain/Pressure: Absent     Pelvic: Cervical exam deferred        Extremities: Normal range of motion.  Edema: None    Mental Status: Normal mood and affect. Normal behavior. Normal judgment and thought content.   Assessment and Plan:  Pregnancy: [id number] at 3537w0d  1. Asymptomatic HIV infection (HCC) followd by id. Viral load and cd4 obtained today. Stressed importance of compliance w/ meds. Will need serial growth scans and consideration of antenatal testing given other risk factors.  2. Supervision of high risk pregnancy, antepartum - referral for comprehensive scan today  3. Previous cesarean section complicating pregnancy - desires tolac. Hx sucessful vbac  4. Crack cocaine use, nicotine abuse - discussed importance of abstinence. Says has stopped cocaine - repeat uds next visit  5. History of gestational diabetes mellitus (GDM) in prior pregnancy, currently pregnant - early a1c 5.7. Scheduling 2-hour today  6. Trichomoniasis - flagyl  7. chlamycia - did not take tx. Prescribing again today. No sex until partners are treated. Always condoms.  8. Insufficient weight gain - says access to food not a problem but i'm not convinced - discussed sw consult, pt became upset, left  9. Hx pre-term delivery - to late for 17-p  10. schizophreniar - says has psychiatrist who she sees regularly. Says compliant w/ meds - will refer to see our [facility] to further assess behavioral health needs  Preterm labor symptoms and general obstetric precautions including but not limited to vaginal bleeding, contractions, leaking of fluid and fetal movement were reviewed in detail with the patient. Please refer to After Visit Summary for other counseling recommendations.  No follow-ups on file.  Future Appointments  Date Time Provider Department Center  04/13/2018 11:30 AM Judyann MunsonSnider, Cynthia, MD [facility]  06/14/2018  2:30 PM York SpanielWillis, Charles K, MD [facility] None    Silvano BilisNoah B Terryon Pineiro, MD

## 2018-03-02 NOTE — Progress Notes (Signed)
Patient ID: Tamara Briggs, female   DOB: July 12, 1986, 32 y.o.   MRN: [id number]  HPI Tamara Briggs is a 32yo F with hiv disease, schizophrenia, and roughly 4 months pregnant. She states that she has been taking her medications routinely. She has decreased her latuda dose to 20mg  and still feels that she is in good health. Has been putting on weight for her pregnancy. Still living with her step father   Outpatient Encounter Medications as of 03/02/2018  Medication Sig  . BIKTARVY 50-200-25 MG TABS tablet Take 1 tablet by mouth daily.  Marland Kitchen ENSURE (ENSURE) Take 237 mLs by mouth 2 (two) times daily between meals.  . hydrocortisone 2.5 % lotion Apply topically 2 (two) times daily.  . IBU 800 MG tablet Take 800 mg by mouth 2 (two) times daily as needed.  . lurasidone (LATUDA) 20 MG TABS tablet Take 1 tablet (20 mg total) by mouth daily with breakfast.  . megestrol (MEGACE) 40 MG tablet Take 40 mg by mouth daily.  . methocarbamol (ROBAXIN) 500 MG tablet take 1 BY MOUTH TWICE DAILY AS NEEDED  . metroNIDAZOLE (FLAGYL) 500 MG tablet Take two tablets by mouth twice a day, for one day.  Or you can take all four tablets at once if you can tolerate it.  Marland Kitchen PIFELTRO 100 MG TABS tablet Take 1 tablet (100 mg total) by mouth at bedtime.  . Prenatal Vit-Fe Fumarate-FA (PREPLUS) 27-1 MG TABS Take 1 tablet by mouth daily.  . sertraline (ZOLOFT) 50 MG tablet Take 1 tablet (50 mg total) by mouth daily.  Marland Kitchen acetaminophen (TYLENOL) 325 MG tablet Take 2 tablets (650 mg total) by mouth every 4 (four) hours as needed (for pain scale < 4). (Patient not taking: Reported on 01/05/2018)  . diphenhydrAMINE (BENADRYL) 25 MG tablet Take 1 tablet (25 mg total) by mouth every 6 (six) hours. (Patient not taking: Reported on 01/05/2018)   No facility-administered encounter medications on file as of 03/02/2018.      Patient Active Problem List   Diagnosis Date Noted  . 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 HIV affecting pregnancy, antepartum  01/19/2018  . History of preterm delivery, currently pregnant 01/19/2018  . History of gestational diabetes mellitus (GDM) in prior pregnancy, currently pregnant 01/19/2018  . Pre-diabetes 01/13/2018  . Chlamydia infection affecting pregnancy 01/10/2018  . Trichomonal vaginitis during pregnancy 01/10/2018  . Protein-calorie malnutrition, severe (HCC) 11/24/2017  . Crack cocaine use   . Bipolar disorder (HCC)   . Previous cesarean section complicating pregnancy 08/31/2016  . Supervision of high risk pregnancy, antepartum 08/06/2016  . 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 Tardive dyskinesia 06/13/2014  . 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 Schizophrenia (HCC) 10/18/2013  . HIV (human immunodeficiency virus infection) (HCC) 07/27/2011     There are no preventive care reminders to display for this patient.   Review of Systems  Physical Exam   BP 101/62   Pulse 89   Temp 98.7 F (37.1 C) (Oral)   Wt 91 lb 12.8 oz (41.6 kg)   LMP  (LMP Unknown)   BMI 15.76 kg/m   Physical Exam  Constitutional:  oriented to person, place, and time. appears well-developed and well-nourished. No distress.  HENT: [place]/AT, PERRLA, no scleral icterus Mouth/Throat: Oropharynx is clear and moist. No oropharyngeal exudate.  Cardiovascular: Normal rate, regular rhythm and normal heart sounds. Exam reveals no gallop and no friction rub.  No murmur heard.  Pulmonary/Chest: Effort normal and breath sounds normal.  No respiratory distress.  has no wheezes.  Neck = supple, no nuchal rigidity Abdominal: Soft. Bowel sounds are normal.  exhibits no distension. There is no tenderness. Gravid + Lymphadenopathy: no cervical adenopathy. No axillary adenopathy Neurological: alert and oriented to person, place, and time.  Skin: Skin is warm and dry. No rash noted. No erythema.  Psychiatric: a normal mood and affect.  behavior is normal.   Lab Results  Component Value Date   CD4TCELL 35 11/23/2017     Lab Results  Component Value Date   CD4TABS 660 11/23/2017   CD4TABS 540 05/13/2017   CD4TABS 370 (L) 04/06/2017   Lab Results  Component Value Date   HIV1RNAQUANT 27 (H) 11/23/2017   Lab Results  Component Value Date   HEPBSAB POS (A) 07/09/2011   Lab Results  Component Value Date   LABRPR Non Reactive 01/05/2018    CBC Lab Results  Component Value Date   WBC 5.5 01/05/2018   RBC 4.03 01/05/2018   HGB 11.0 (L) 01/05/2018   HCT 33.1 (L) 01/05/2018   PLT 177 01/05/2018   MCV 82 01/05/2018   MCH 27.3 01/05/2018   MCHC 33.2 01/05/2018   RDW 15.6 (H) 01/05/2018   LYMPHSABS 2.0 01/05/2018   MONOABS 432 04/06/2017   EOSABS 0.1 01/05/2018    BMET Lab Results  Component Value Date   NA 132 (L) 11/23/2017   K 3.9 11/23/2017   CL 106 11/23/2017   CO2 28 11/23/2017   GLUCOSE 88 11/23/2017   BUN 13 11/23/2017   CREATININE 0.61 11/23/2017   CALCIUM 9.0 11/23/2017   GFRNONAA 120 11/23/2017   GFRAA 139 11/23/2017      Assessment and Plan  hiv disease= continue on current regimen. will check viral load, cd 4 count  Health promotion = flu in the fall   Schizophrenia = continue on latuda 20mg  daily  High risk pregnancy = continue to take prenatals and has appt this afternoon

## 2018-03-02 NOTE — Progress Notes (Signed)
Did not take zithromycin packet.

## 2018-03-03 ENCOUNTER — Telehealth: Payer: Self-pay | Admitting: *Deleted

## 2018-03-03 LAB — T-HELPER CELL (CD4) - (RCID CLINIC ONLY)
CD4 T CELL HELPER: 25 % — AB (ref 33–55)
CD4 T Cell Abs: 320 /uL — ABNORMAL LOW (ref 400–2700)

## 2018-03-03 NOTE — Telephone Encounter (Signed)
Scheduled and called Tamara Briggs re: US for 03/14/18 at 1:45pm- ( left message on her voicemail).

## 2018-03-04 LAB — HIV-1 RNA QUANT-NO REFLEX-BLD
HIV 1 RNA QUANT: DETECTED {copies}/mL — AB
HIV-1 RNA Quant, Log: <1.3 Log copies/mL — AB

## 2018-03-07 ENCOUNTER — Emergency Department (HOSPITAL_COMMUNITY)
Admission: EM | Admit: 2018-03-07 | Discharge: 2018-03-08 | Disposition: A | Discharge status: Home / Self Care | Payer: Medicaid Other | Attending: Emergency Medicine | Admitting: Emergency Medicine

## 2018-03-07 ENCOUNTER — Encounter (HOSPITAL_COMMUNITY): Payer: Self-pay

## 2018-03-07 ENCOUNTER — Other Ambulatory Visit: Payer: Self-pay | Admitting: *Deleted

## 2018-03-07 ENCOUNTER — Encounter (HOSPITAL_COMMUNITY): Payer: Self-pay | Admitting: Emergency Medicine

## 2018-03-07 DIAGNOSIS — Z5321 Procedure and treatment not carried out due to patient leaving prior to being seen by health care provider: Secondary | ICD-10-CM | POA: Diagnosis not present

## 2018-03-07 DIAGNOSIS — R51 Headache: Secondary | ICD-10-CM | POA: Diagnosis present

## 2018-03-07 MED ORDER — ACETAMINOPHEN 500 MG PO TABS: 1000.0000 mg | ORAL_TABLET | Freq: Once | ORAL | Status: DC
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
## 2018-03-07 NOTE — Progress Notes (Signed)
Opened in error

## 2018-03-08 ENCOUNTER — Other Ambulatory Visit: Payer: Self-pay | Admitting: *Deleted

## 2018-03-08 ENCOUNTER — Other Ambulatory Visit: Payer: Medicaid Other

## 2018-03-08 ENCOUNTER — Institutional Professional Consult (permissible substitution): Payer: Medicaid Other

## 2018-03-08 MED ORDER — ENSURE PO LIQD: 237.0000 mL | Freq: Two times a day (BID) | ORAL | 11 refills | Status: DC

## 2018-03-08 NOTE — BH Specialist Note (Deleted)
Integrated Behavioral Health Initial Visit  MRN: [id number] Name: Tamara Briggs Heart  Number of Integrated Behavioral Health Clinician visits:: {IBH Number of Visits:21014052} Session Start time: ***  Session End time: *** Total time: {IBH Total Time:21014050}  Type of Service: Integrated Behavioral Health- Individual/Family Interpretor:{yes [id number]}no:314532} Interpretor Name and Language: ***   Warm Hand Off Completed.       SUBJECTIVE: Tamara Briggs Lewellen is a 32 y.o. female accompanied by {CHL AMB ACCOMPANIED NG:[id number]}BY:[phone]} Patient was referred by *** for ***. Patient reports the following symptoms/concerns: *** Duration of problem: ***; Severity of problem: {Mild/Moderate/Severe:20260}  OBJECTIVE: Mood: {BHH MOOD:22306} and Affect: {BHH AFFECT:22307} Risk of harm to self or others: {CHL AMB BH Suicide Current Mental Status:21022748}  LIFE CONTEXT: Family and Social: *** School/Work: *** Self-Care: *** Life Changes: ***  GOALS ADDRESSED: Patient will: 1. Reduce symptoms of: {IBH Symptoms:21014056} 2. Increase knowledge and/or ability of: {IBH Patient Tools:21014057}  3. Demonstrate ability to: {IBH Goals:21014053}  INTERVENTIONS: Interventions utilized: {IBH Interventions:21014054}  Standardized Assessments completed: {IBH Screening Tools:21014051}  ASSESSMENT: Patient currently experiencing ***.   Patient may benefit from ***.  PLAN: 1. Follow up with behavioral health clinician on : *** 2. Behavioral recommendations: *** 3. Referral(s): {IBH Referrals:21014055} 4. "From scale of 1-10, how likely are you to follow plan?": ***  Rae LipsJamie C Rosi Secrist, LCSW  [facility] screen [facility] 01/05/2018 12/30/2017 12/22/2017 11/23/2017 06/28/2017  Decreased Interest 2 1 0 0 0  Down, Depressed, Hopeless 2 1 0 0 0  PHQ - 2 Score 4 2 0 0 0  Altered sleeping 3 - - - -  Tired, decreased energy 3 - - - -  Change in appetite 2 - - - -  Feeling bad or failure about yourself  0 - - - -  Trouble  concentrating 0 - - - -  Moving slowly or fidgety/restless 0 - - - -  Suicidal thoughts 0 - - - -  PHQ-9 Score 12 - - - -  Difficult doing work/chores - - - - -  Some recent data might be hidden   GAD 7 : Generalized Anxiety Score 01/05/2018  Nervous, Anxious, on Edge 0  Control/stop worrying 2  Worry too much - different things 3  Trouble relaxing 2  Restless 1  Easily annoyed or irritable 3  Afraid - awful might happen 2  Total GAD 7 Score 13

## 2018-03-10 ENCOUNTER — Other Ambulatory Visit: Payer: Medicaid Other

## 2018-03-10 ENCOUNTER — Institutional Professional Consult (permissible substitution): Payer: Medicaid Other

## 2018-03-10 NOTE — BH Specialist Note (Deleted)
Integrated Behavioral Health Initial Visit  MRN: [id number] Name: Terrica Carnell  Number of Integrated Behavioral Health Clinician visits:: {IBH Number of Visits:21014052} Session Start time: ***  Session End time: *** Total time: {IBH Total Time:21014050}  Type of Service: Integrated Behavioral Health- Individual/Family Interpretor:{yes no:314532} Interpretor Name and Language: ***   Warm Hand Off Completed.       SUBJECTIVE: Audie Souder is a 32 y.o. female accompanied by {CHL AMB ACCOMPANIED BY:2101301017} Patient was referred by *** for ***. Patient reports the following symptoms/concerns: *** Duration of problem: ***; Severity of problem: {Mild/Moderate/Severe:20260}  OBJECTIVE: Mood: {BHH MOOD:22306} and Affect: {BHH AFFECT:22307} Risk of harm to self or others: {CHL AMB BH Suicide Current Mental Status:21022748}  LIFE CONTEXT: Family and Social: *** School/Work: *** Self-Care: *** Life Changes: ***  GOALS ADDRESSED: Patient will: 1. Reduce symptoms of: {IBH Symptoms:21014056} 2. Increase knowledge and/or ability of: {IBH Patient Tools:21014057}  3. Demonstrate ability to: {IBH Goals:21014053}  INTERVENTIONS: Interventions utilized: {IBH Interventions:21014054}  Standardized Assessments completed: {IBH Screening Tools:21014051}  ASSESSMENT: Patient currently experiencing ***.   Patient may benefit from ***.  PLAN: 1. Follow up with behavioral health clinician on : *** 2. Behavioral recommendations: *** 3. Referral(s): {IBH Referrals:21014055} 4. "From scale of 1-10, how likely are you to follow plan?": ***  Jamie C McMannes, LCSW  Depression screen PHQ 2/9 01/05/2018 12/30/2017 12/22/2017 11/23/2017 06/28/2017  Decreased Interest 2 1 0 0 0  Down, Depressed, Hopeless 2 1 0 0 0  PHQ - 2 Score 4 2 0 0 0  Altered sleeping 3 - - - -  Tired, decreased energy 3 - - - -  Change in appetite 2 - - - -  Feeling bad or failure about yourself  0 - - - -  Trouble  concentrating 0 - - - -  Moving slowly or fidgety/restless 0 - - - -  Suicidal thoughts 0 - - - -  PHQ-9 Score 12 - - - -  Difficult doing work/chores - - - - -  Some recent data might be hidden   GAD 7 : Generalized Anxiety Score 01/05/2018  Nervous, Anxious, on Edge 0  Control/stop worrying 2  Worry too much - different things 3  Trouble relaxing 2  Restless 1  Easily annoyed or irritable 3  Afraid - awful might happen 2  Total GAD 7 Score 13           

## 2018-03-14 ENCOUNTER — Ambulatory Visit (HOSPITAL_COMMUNITY)
Admission: RE | Admit: 2018-03-14 | Discharge: 2018-03-14 | Disposition: A | Discharge status: Home / Self Care | Payer: Medicaid Other | Source: Ambulatory Visit | Attending: Obstetrics & Gynecology | Admitting: Obstetrics & Gynecology

## 2018-03-24 ENCOUNTER — Other Ambulatory Visit: Payer: Self-pay | Admitting: Family Medicine

## 2018-03-24 ENCOUNTER — Encounter: Payer: Self-pay | Admitting: Advanced Practice Midwife

## 2018-03-25 ENCOUNTER — Encounter: Payer: Self-pay | Admitting: Internal Medicine

## 2018-03-28 ENCOUNTER — Encounter: Payer: Medicaid Other | Admitting: Obstetrics and Gynecology

## 2018-03-28 ENCOUNTER — Encounter: Payer: Self-pay | Admitting: Obstetrics and Gynecology

## 2018-03-29 ENCOUNTER — Encounter (HOSPITAL_COMMUNITY): Payer: Self-pay

## 2018-03-31 ENCOUNTER — Ambulatory Visit (HOSPITAL_COMMUNITY)
Admission: RE | Admit: 2018-03-31 | Discharge: 2018-03-31 | Disposition: A | Discharge status: Home / Self Care | Payer: Medicaid Other | Source: Ambulatory Visit | Attending: Obstetrics & Gynecology | Admitting: Obstetrics & Gynecology

## 2018-04-06 ENCOUNTER — Telehealth: Payer: Self-pay | Admitting: Behavioral Health

## 2018-04-06 NOTE — Telephone Encounter (Signed)
Patient called stating she has run out of her hydrocortisone cream.  Patient states she is currently having itching all over.  She states she has been having this issue since her medications changed over the last few months.  Patient states she has not started any new medications and states hydrocortisone has worked in the past.  Angeline SlimAshley Hill RN

## 2018-04-07 ENCOUNTER — Encounter (HOSPITAL_COMMUNITY): Payer: Self-pay

## 2018-04-11 ENCOUNTER — Ambulatory Visit (HOSPITAL_COMMUNITY): Admission: RE | Admit: 2018-04-11 | Payer: Medicaid Other | Source: Ambulatory Visit

## 2018-04-13 ENCOUNTER — Ambulatory Visit: Payer: Medicaid Other | Admitting: Internal Medicine

## 2018-04-15 ENCOUNTER — Telehealth: Payer: Self-pay | Admitting: Family Medicine

## 2018-04-15 ENCOUNTER — Encounter: Payer: Medicaid Other | Admitting: Obstetrics and Gynecology
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
## 2018-04-18 ENCOUNTER — Ambulatory Visit: Payer: Medicaid Other

## 2018-04-20 ENCOUNTER — Telehealth: Payer: Self-pay | Admitting: Obstetrics and Gynecology

## 2018-04-20 ENCOUNTER — Ambulatory Visit (INDEPENDENT_AMBULATORY_CARE_PROVIDER_SITE_OTHER): Payer: Medicaid Other | Admitting: Obstetrics and Gynecology

## 2018-04-20 ENCOUNTER — Ambulatory Visit: Payer: Medicaid Other

## 2018-04-20 ENCOUNTER — Encounter (HOSPITAL_COMMUNITY): Payer: Self-pay

## 2018-04-20 ENCOUNTER — Other Ambulatory Visit (HOSPITAL_COMMUNITY)
Admission: RE | Admit: 2018-04-20 | Discharge: 2018-04-20 | Disposition: A | Discharge status: Home / Self Care | Payer: Medicaid Other | Source: Ambulatory Visit | Attending: Obstetrics and Gynecology | Admitting: Obstetrics and Gynecology

## 2018-04-20 VITALS — BP 110/64 | HR 84 | Wt 97.2 lb

## 2018-04-20 DIAGNOSIS — E8881 Metabolic syndrome: Secondary | ICD-10-CM

## 2018-04-20 DIAGNOSIS — A5901 Trichomonal vulvovaginitis: Secondary | ICD-10-CM

## 2018-04-20 DIAGNOSIS — O09899 Supervision of other high risk pregnancies, unspecified trimester: Secondary | ICD-10-CM

## 2018-04-20 DIAGNOSIS — O0990 Supervision of high risk pregnancy, unspecified, unspecified trimester: Secondary | ICD-10-CM

## 2018-04-20 DIAGNOSIS — A749 Chlamydial infection, unspecified: Secondary | ICD-10-CM | POA: Diagnosis not present

## 2018-04-20 DIAGNOSIS — O09219 Supervision of pregnancy with history of pre-term labor, unspecified trimester: Secondary | ICD-10-CM

## 2018-04-20 DIAGNOSIS — O98813 Other maternal infectious and parasitic diseases complicating pregnancy, third trimester: Secondary | ICD-10-CM | POA: Diagnosis not present

## 2018-04-20 DIAGNOSIS — Z3A28 28 weeks gestation of pregnancy: Secondary | ICD-10-CM | POA: Insufficient documentation

## 2018-04-20 DIAGNOSIS — O98819 Other maternal infectious and parasitic diseases complicating pregnancy, unspecified trimester: Secondary | ICD-10-CM

## 2018-04-20 DIAGNOSIS — O0930 Supervision of pregnancy with insufficient antenatal care, unspecified trimester: Secondary | ICD-10-CM

## 2018-04-20 DIAGNOSIS — O23593 Infection of other part of genital tract in pregnancy, third trimester: Secondary | ICD-10-CM | POA: Diagnosis not present

## 2018-04-20 DIAGNOSIS — F1490 Cocaine use, unspecified, uncomplicated: Secondary | ICD-10-CM

## 2018-04-20 DIAGNOSIS — O98719 Human immunodeficiency virus [HIV] disease complicating pregnancy, unspecified trimester: Secondary | ICD-10-CM

## 2018-04-20 DIAGNOSIS — O23599 Infection of other part of genital tract in pregnancy, unspecified trimester: Secondary | ICD-10-CM

## 2018-04-20 DIAGNOSIS — Z98891 History of uterine scar from previous surgery: Secondary | ICD-10-CM

## 2018-04-20 LAB — POCT URINALYSIS DIP (DEVICE)
Glucose, UA: NEGATIVE mg/dL
Hgb urine dipstick: NEGATIVE
KETONES UR: NEGATIVE mg/dL
NITRITE: NEGATIVE
PH: 7 (ref 5.0–8.0)
Protein, ur: 30 mg/dL — AB
Specific Gravity, Urine: 1.02 (ref 1.005–1.030)
Urobilinogen, UA: 1 mg/dL (ref 0.0–1.0)

## 2018-04-20 LAB — OB RESULTS CONSOLE GC/CHLAMYDIA: Gonorrhea: NEGATIVE

## 2018-04-20 NOTE — Telephone Encounter (Signed)
Called patient to reschedule 2 hr GTT on 9/5 @ 8:30. Patient hung up without confirming appt. Patient came back to office for an updated appt list a few mins after.

## 2018-04-20 NOTE — Progress Notes (Signed)
Pt declines 17p injections

## 2018-04-20 NOTE — Progress Notes (Addendum)
Prenatal Visit Note Date: 04/20/2018 Clinic: [facility]  Subjective:  Tamara Briggs is a 32 y.o. [id number] at 4946w0d being seen today for ongoing prenatal care.  She is currently monitored for the following issues for this high-risk pregnancy and has HIV (human immunodeficiency virus infection) (HCC); Schizophrenia (HCC); History of neural tube defect; Secondary Parkinson disease (HCC); Tardive dyskinesia; Supervision of high risk pregnancy, antepartum; Previous cesarean section complicating pregnancy; Crack cocaine use; Bipolar disorder (HCC); Protein-calorie malnutrition, severe (HCC); Chlamydia infection affecting pregnancy; Trichomonal vaginitis during pregnancy; Pre-diabetes; HIV affecting pregnancy, antepartum; History of preterm delivery, currently pregnant; History of gestational diabetes mellitus (GDM) in prior pregnancy, currently pregnant; Insulin resistance; Insufficient weight gain during pregnancy; Neonatal death; and Insufficient prenatal care on their problem list.  Patient reports no complaints.   Contractions: Not present. Vag. Bleeding: None.  Movement: Present. Denies leaking of fluid.   The following portions of the patient's history were reviewed and updated as appropriate: allergies, current medications, past family history, past medical history, past social history, past surgical history and problem list. Problem list updated.  Objective:   Vitals:   04/20/18 1447  BP: 110/64  Pulse: 84  Weight: 97 lb 3.2 oz (44.1 kg)    Fetal Status: Fetal Heart Rate (bpm): 141   Movement: Present     General:  Alert, oriented and cooperative. Patient is in no acute distress.  Skin: Skin is warm and dry. No rash noted.   Cardiovascular: Normal heart rate noted  Respiratory: Normal respiratory effort, no problems with respiration noted  Abdomen: Soft, gravid, appropriate for gestational age. Pain/Pressure: Present     Pelvic:  Cervical exam deferred         Extremities: Normal range of motion.  Edema: None  Mental Status: Normal mood and affect. Normal behavior. Normal judgment and thought content.   Urinalysis:      Assessment and Plan:  Pregnancy: [id number] at 5846w0d  1. Supervision of high risk pregnancy, antepartum Doesn't want a BTL. Pt doesn't want to do GTT today. Pt has r/s anatomy u/s for next week. Stressed importance of this and keeping subsequent visits with us and ID - Cervicovaginal ancillary only  2. Previous cesarean section complicating pregnancy Can d/w pt more nv. H/o vbac  3. Crack cocaine use L&D admission UDS  4. Insufficient antepartum care See above  5. Insulin resistance Never came for early gtt  6. Trichomonal vaginitis during pregnancy, antepartum toc today - Cervicovaginal ancillary only  7. Chlamydia infection affecting pregnancy, antepartum toc today - Cervicovaginal ancillary only  8. HIV affecting pregnancy, antepartum Stressed importance of keeping visits with ID. Has f/u for next week with them  9. History of preterm delivery, currently pregnant Missed early visits to get set up for 17p  Preterm labor symptoms and general obstetric precautions including but not limited to vaginal bleeding, contractions, leaking of fluid and fetal movement were reviewed in detail with the patient. Please refer to After Visit Summary for other counseling recommendations.  Return in about 1 week (around 04/27/2018) for hrob and 2hr GTT.   [place] BingPickens, Almon Whitford, MD

## 2018-04-22 LAB — CERVICOVAGINAL ANCILLARY ONLY
CHLAMYDIA, DNA PROBE: POSITIVE — AB
Neisseria Gonorrhea: NEGATIVE
Trichomonas: NEGATIVE

## 2018-04-26 ENCOUNTER — Telehealth: Payer: Self-pay | Admitting: Cardiovascular Disease

## 2018-04-26 ENCOUNTER — Telehealth: Payer: Self-pay | Admitting: Obstetrics and Gynecology

## 2018-04-26 ENCOUNTER — Encounter: Payer: Medicaid Other | Admitting: Obstetrics and Gynecology

## 2018-04-26 ENCOUNTER — Telehealth: Payer: Self-pay | Admitting: *Deleted

## 2018-04-26 ENCOUNTER — Encounter: Payer: Self-pay | Admitting: Obstetrics and Gynecology

## 2018-04-26 DIAGNOSIS — A749 Chlamydial infection, unspecified: Secondary | ICD-10-CM

## 2018-04-26 MED ORDER — AZITHROMYCIN 250 MG PO TABS: 1000.0000 mg | ORAL_TABLET | Freq: Once | ORAL | 0 refills | Status: AC

## 2018-04-26 NOTE — Telephone Encounter (Signed)
Pt notified of +chlamydia result and that a prescription for Zithromax 1g x1 dose would be e-prescribed.  Pt advised to have sexual partner treated and to abstain from sex until they were both treated.   Pt's pharmacy verified.

## 2018-04-26 NOTE — Telephone Encounter (Signed)
Wrong Provider

## 2018-04-26 NOTE — Telephone Encounter (Signed)
Called patient about her missed appointment today. Left a VM to give [place] a call back.

## 2018-04-27 ENCOUNTER — Ambulatory Visit (HOSPITAL_COMMUNITY)
Admission: RE | Admit: 2018-04-27 | Discharge: 2018-04-27 | Disposition: A | Discharge status: Home / Self Care | Payer: Medicaid Other | Source: Ambulatory Visit | Attending: Obstetrics & Gynecology | Admitting: Obstetrics & Gynecology

## 2018-04-27 ENCOUNTER — Encounter (HOSPITAL_COMMUNITY): Payer: Self-pay

## 2018-04-27 ENCOUNTER — Other Ambulatory Visit: Payer: Self-pay | Admitting: Obstetrics & Gynecology

## 2018-04-27 DIAGNOSIS — O98819 Other maternal infectious and parasitic diseases complicating pregnancy, unspecified trimester: Secondary | ICD-10-CM

## 2018-04-27 DIAGNOSIS — O99323 Drug use complicating pregnancy, third trimester: Secondary | ICD-10-CM | POA: Diagnosis not present

## 2018-04-27 DIAGNOSIS — F1410 Cocaine abuse, uncomplicated: Secondary | ICD-10-CM | POA: Diagnosis not present

## 2018-04-27 DIAGNOSIS — Z3A29 29 weeks gestation of pregnancy: Secondary | ICD-10-CM | POA: Diagnosis not present

## 2018-04-27 DIAGNOSIS — O98313 Other infections with a predominantly sexual mode of transmission complicating pregnancy, third trimester: Secondary | ICD-10-CM | POA: Diagnosis not present

## 2018-04-27 DIAGNOSIS — O34219 Maternal care for unspecified type scar from previous cesarean delivery: Secondary | ICD-10-CM

## 2018-04-27 DIAGNOSIS — O99333 Smoking (tobacco) complicating pregnancy, third trimester: Secondary | ICD-10-CM

## 2018-04-27 DIAGNOSIS — O09293 Supervision of pregnancy with other poor reproductive or obstetric history, third trimester: Secondary | ICD-10-CM

## 2018-04-27 DIAGNOSIS — O09213 Supervision of pregnancy with history of pre-term labor, third trimester: Secondary | ICD-10-CM

## 2018-04-27 DIAGNOSIS — A749 Chlamydial infection, unspecified: Secondary | ICD-10-CM

## 2018-04-27 DIAGNOSIS — A568 Sexually transmitted chlamydial infection of other sites: Secondary | ICD-10-CM | POA: Diagnosis not present

## 2018-04-27 DIAGNOSIS — O98719 Human immunodeficiency virus [HIV] disease complicating pregnancy, unspecified trimester: Secondary | ICD-10-CM

## 2018-04-27 DIAGNOSIS — O09219 Supervision of pregnancy with history of pre-term labor, unspecified trimester: Secondary | ICD-10-CM

## 2018-04-27 DIAGNOSIS — Z363 Encounter for antenatal screening for malformations: Secondary | ICD-10-CM

## 2018-04-27 DIAGNOSIS — O09299 Supervision of pregnancy with other poor reproductive or obstetric history, unspecified trimester: Secondary | ICD-10-CM

## 2018-04-27 DIAGNOSIS — O09899 Supervision of other high risk pregnancies, unspecified trimester: Secondary | ICD-10-CM

## 2018-04-27 DIAGNOSIS — Z8632 Personal history of gestational diabetes: Secondary | ICD-10-CM

## 2018-04-27 DIAGNOSIS — O98713 Human immunodeficiency virus [HIV] disease complicating pregnancy, third trimester: Secondary | ICD-10-CM

## 2018-04-28 ENCOUNTER — Encounter: Payer: Self-pay | Admitting: Advanced Practice Midwife

## 2018-04-28 ENCOUNTER — Other Ambulatory Visit: Payer: Medicaid Other

## 2018-04-28 ENCOUNTER — Other Ambulatory Visit (HOSPITAL_COMMUNITY): Payer: Self-pay | Admitting: *Deleted

## 2018-04-28 ENCOUNTER — Other Ambulatory Visit: Payer: Self-pay | Admitting: General Practice

## 2018-04-28 DIAGNOSIS — O0990 Supervision of high risk pregnancy, unspecified, unspecified trimester: Secondary | ICD-10-CM

## 2018-04-28 DIAGNOSIS — O365990 Maternal care for other known or suspected poor fetal growth, unspecified trimester, not applicable or unspecified: Secondary | ICD-10-CM | POA: Insufficient documentation

## 2018-04-28 DIAGNOSIS — Z362 Encounter for other antenatal screening follow-up: Secondary | ICD-10-CM

## 2018-04-29 LAB — CBC
HEMATOCRIT: 28.8 % — AB (ref 34.0–46.6)
Hemoglobin: 10.3 g/dL — ABNORMAL LOW (ref 11.1–15.9)
MCH: 30.5 pg (ref 26.6–33.0)
MCHC: 35.8 g/dL — ABNORMAL HIGH (ref 31.5–35.7)
MCV: 85 fL (ref 79–97)
Platelets: 117 10*3/uL — ABNORMAL LOW (ref 150–450)
RBC: 3.38 x10E6/uL — AB (ref 3.77–5.28)
RDW: 13 % (ref 12.3–15.4)
WBC: 5.1 10*3/uL (ref 3.4–10.8)

## 2018-04-29 LAB — HIV 1/2 AB DIFFERENTIATION
HIV 1 AB: POSITIVE — AB
HIV 2 AB: NEGATIVE
NOTE (HIV CONF MULTISPOT): POSITIVE

## 2018-04-29 LAB — GLUCOSE TOLERANCE, 2 HOURS W/ 1HR
GLUCOSE, 2 HOUR: 97 mg/dL (ref 65–152)
Glucose, 1 hour: 99 mg/dL (ref 65–179)
Glucose, Fasting: 77 mg/dL (ref 65–91)

## 2018-04-29 LAB — RPR: RPR Ser Ql: NONREACTIVE

## 2018-04-29 LAB — HIV ANTIBODY (ROUTINE TESTING W REFLEX): HIV SCREEN 4TH GENERATION: REACTIVE — AB

## 2018-05-01 ENCOUNTER — Encounter: Payer: Self-pay | Admitting: Obstetrics and Gynecology

## 2018-05-01 DIAGNOSIS — O99119 Other diseases of the blood and blood-forming organs and certain disorders involving the immune mechanism complicating pregnancy, unspecified trimester: Secondary | ICD-10-CM

## 2018-05-01 DIAGNOSIS — D696 Thrombocytopenia, unspecified: Secondary | ICD-10-CM | POA: Insufficient documentation

## 2018-05-02 ENCOUNTER — Telehealth: Payer: Self-pay | Admitting: *Deleted

## 2018-05-02 ENCOUNTER — Ambulatory Visit (INDEPENDENT_AMBULATORY_CARE_PROVIDER_SITE_OTHER): Payer: Medicaid Other | Admitting: Internal Medicine

## 2018-05-02 VITALS — BP 104/64 | HR 98 | Temp 99.0°F

## 2018-05-02 DIAGNOSIS — J0100 Acute maxillary sinusitis, unspecified: Secondary | ICD-10-CM

## 2018-05-02 DIAGNOSIS — E43 Unspecified severe protein-calorie malnutrition: Secondary | ICD-10-CM

## 2018-05-02 DIAGNOSIS — B20 Human immunodeficiency virus [HIV] disease: Secondary | ICD-10-CM

## 2018-05-02 DIAGNOSIS — Z91199 Patient's noncompliance with other medical treatment and regimen due to unspecified reason: Secondary | ICD-10-CM

## 2018-05-02 DIAGNOSIS — Z9119 Patient's noncompliance with other medical treatment and regimen: Secondary | ICD-10-CM

## 2018-05-02 MED ORDER — AZITHROMYCIN 250 MG PO TABS: ORAL_TABLET | ORAL | 0 refills | Status: DC

## 2018-05-02 NOTE — Patient Instructions (Signed)
Can you saline nasal spray to help with nasal congestion  Can use tylenol for headache/facial pain from sinusitis

## 2018-05-02 NOTE — Progress Notes (Signed)
RFV: follow up for hiv disease and pregnancy  Patient ID: Tamara Briggs, female   DOB: September 28, 1985, 32 y.o.   MRN: [id number]  HPI 32yo F with hiv disease, CD 4 count of 320/VL 20 also At [redacted]wk gestation. She reports taking her hiv regimen but today has been feeling poorly Has nasal congestion sinus pressure with ear pain x 10 d. Greenish discharge. She reports poor sleep, lethargy Outpatient Encounter Medications as of 05/02/2018  Medication Sig  . benztropine (COGENTIN) 0.5 MG tablet Take 0.5 mg by mouth daily.  Marland Kitchen BIKTARVY 50-200-25 MG TABS tablet Take 1 tablet by mouth daily.  . hydrocortisone 2.5 % lotion Apply topically 2 (two) times daily.  . IBU 800 MG tablet Take 800 mg by mouth 2 (two) times daily as needed.  Marland Kitchen LATUDA 40 MG TABS tablet TAKE one tablet BY MOUTH EVERY EVENING with meals  . methocarbamol (ROBAXIN) 500 MG tablet take 1 BY MOUTH TWICE DAILY AS NEEDED  . PIFELTRO 100 MG TABS tablet Take 1 tablet (100 mg total) by mouth at bedtime.  . Prenatal Vit-Fe Fumarate-FA (PREPLUS) 27-1 MG TABS Take 1 tablet by mouth daily.  . sertraline (ZOLOFT) 50 MG tablet Take 1 tablet (50 mg total) by mouth daily.  Marland Kitchen acetaminophen (TYLENOL) 325 MG tablet Take 2 tablets (650 mg total) by mouth every 4 (four) hours as needed (for pain scale < 4). (Patient not taking: Reported on 04/27/2018)  . ENSURE (ENSURE) Take 237 mLs by mouth 2 (two) times daily between meals. (Patient not taking: Reported on 04/27/2018)  . lurasidone (LATUDA) 20 MG TABS tablet Take 1 tablet (20 mg total) by mouth daily with breakfast. (Patient not taking: Reported on 04/27/2018)   No facility-administered encounter medications on file as of 05/02/2018.      Patient Active Problem List   Diagnosis Date Noted  . Gestational thrombocytopenia (HCC) 05/01/2018  . Pregnancy affected by fetal growth restriction 04/28/2018  . Insufficient prenatal care 04-24-18  . Neonatal death 2018/03/28  . Insufficient weight gain during pregnancy  03/02/2018  . 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 HIV affecting pregnancy, antepartum 01/19/2018  . History of preterm delivery, currently pregnant 01/19/2018  . History of gestational diabetes mellitus (GDM) in prior pregnancy, currently pregnant 01/19/2018  . Pre-diabetes 01/13/2018  . Chlamydia infection affecting pregnancy 01/10/2018  . Trichomonal vaginitis during pregnancy 01/10/2018  . Protein-calorie malnutrition, severe (HCC) 11/24/2017  . Crack cocaine use   . Bipolar disorder (HCC)   . History of VBAC 08/31/2016  . Supervision of high risk pregnancy, antepartum 08/06/2016  . 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 Tardive dyskinesia 06/13/2014  . 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 Schizophrenia (HCC) 10/18/2013  . HIV (human immunodeficiency virus infection) (HCC) 07/27/2011     Health Maintenance Due  Topic Date Due  . INFLUENZA VACCINE  03-28-2018     Review of Systems Per hpi otherwise 12 point ros is negative Physical Exam   BP 104/64   Pulse 98   Temp 99 F (37.2 C) (Oral)   LMP  (LMP Unknown)   Physical Exam  Constitutional:  oriented to person, place, and time. appears chronically ill and under-nourished. solomnent unable to keep eyes open, ? Under the influence HENT: [place]/AT, PERRLA, no scleral icterus, TMs clear. Tenderness to maxillary sinuses Mouth/Throat: Oropharynx is clear and moist. No oropharyngeal exudate.  Cardiovascular: Normal rate, regular rhythm and normal heart sounds. Exam reveals no gallop and no friction rub.  No murmur heard.  Pulmonary/Chest: Effort normal and breath sounds normal. No respiratory distress.  has no wheezes.  Neck = supple, no nuchal rigidity Abdominal: Soft. Bowel sounds are normal.  Gravid abd. Fetus laying predominant to the right Lymphadenopathy: no cervical adenopathy. No axillary adenopathy Neurological: alert and oriented to person, place, and time.  Skin: Skin is warm and dry. No rash noted. No  erythema.  Psychiatric: solomnent  Lab Results  Component Value Date   CD4TCELL 25 (L) 03/02/2018   Lab Results  Component Value Date   CD4TABS 320 (L) 03/02/2018   CD4TABS 660 11/23/2017   CD4TABS 540 05/13/2017   Lab Results  Component Value Date   HIV1RNAQUANT <20 DETECTED (A) 03/02/2018   Lab Results  Component Value Date   HEPBSAB POS (A) 07/09/2011   Lab Results  Component Value Date   LABRPR Non Reactive 04/28/2018    CBC Lab Results  Component Value Date   WBC 5.1 04/28/2018   RBC 3.38 (L) 04/28/2018   HGB 10.3 (L) 04/28/2018   HCT 28.8 (L) 04/28/2018   PLT 117 (L) 04/28/2018   MCV 85 04/28/2018   MCH 30.5 04/28/2018   MCHC 35.8 (H) 04/28/2018   RDW 13.0 04/28/2018   LYMPHSABS 2.0 01/05/2018   MONOABS 432 04/06/2017   EOSABS 0.1 01/05/2018    BMET Lab Results  Component Value Date   NA 132 (L) 11/23/2017   K 3.9 11/23/2017   CL 106 11/23/2017   CO2 28 11/23/2017   GLUCOSE 88 11/23/2017   BUN 13 11/23/2017   CREATININE 0.61 11/23/2017   CALCIUM 9.0 11/23/2017   GFRNONAA 120 11/23/2017   GFRAA 139 11/23/2017      Assessment and Plan  Sinusitis - concern that length of time more c/w bacterial sinusitis Will give course of azithromycin  hiv =Will check viral load, continue with biktarvy plus pifletro  Give groceries for food insecurities  Have her come back in 2 wk to give flu shot  Addendum == has detectable VL. Will have ambre - do outreach to improve her adherence

## 2018-05-02 NOTE — Telephone Encounter (Signed)
Received a call from [facility] just stating she is pregnant and her due date is in November. Tawonda stated she wants to stay connected with me and wondered if I knew of anyone with infant clothing. So happy to hear from [facility] and I let her know that I would be happy to help her in anyway I can.

## 2018-05-03 ENCOUNTER — Encounter: Payer: Self-pay | Admitting: Obstetrics and Gynecology

## 2018-05-03 ENCOUNTER — Encounter: Payer: Medicaid Other | Admitting: Obstetrics and Gynecology

## 2018-05-03 ENCOUNTER — Encounter: Payer: Self-pay | Admitting: *Deleted

## 2018-05-04 LAB — HIV-1 RNA QUANT-NO REFLEX-BLD
HIV 1 RNA Quant: 252 copies/mL — ABNORMAL HIGH
HIV-1 RNA Quant, Log: 2.4 Log copies/mL — ABNORMAL HIGH

## 2018-05-06 ENCOUNTER — Telehealth: Payer: Self-pay | Admitting: *Deleted

## 2018-05-06 NOTE — Telephone Encounter (Signed)
Called to inform pt of her appointment for fetal echo that was scheduled for October 3rd at 3pm with Dr. Elizebeth [facility]okingotton [facility].  Pt verbalized understanding.

## 2018-05-06 NOTE — Telephone Encounter (Signed)
-----   Message from [place], [place] sent at 04/28/2018 12:31 PM EDT ----- Regarding: Needs fetal Echo Please schedule fetal ECHO ASAP.

## 2018-05-09 ENCOUNTER — Other Ambulatory Visit: Payer: Self-pay | Admitting: Behavioral Health

## 2018-05-09 DIAGNOSIS — B20 Human immunodeficiency virus [HIV] disease: Secondary | ICD-10-CM

## 2018-05-09 MED ORDER — BICTEGRAVIR-EMTRICITAB-TENOFOV 50-200-25 MG PO TABS: 1.0000 | ORAL_TABLET | Freq: Every day | ORAL | 0 refills | Status: DC

## 2018-05-11 ENCOUNTER — Other Ambulatory Visit: Payer: Self-pay

## 2018-05-11 DIAGNOSIS — B20 Human immunodeficiency virus [HIV] disease: Secondary | ICD-10-CM

## 2018-05-11 MED ORDER — BICTEGRAVIR-EMTRICITAB-TENOFOV 50-200-25 MG PO TABS: 1.0000 | ORAL_TABLET | Freq: Every day | ORAL | 11 refills | Status: DC

## 2018-05-13 ENCOUNTER — Ambulatory Visit (INDEPENDENT_AMBULATORY_CARE_PROVIDER_SITE_OTHER): Payer: Medicaid Other | Admitting: Obstetrics & Gynecology

## 2018-05-13 ENCOUNTER — Telehealth: Payer: Self-pay | Admitting: *Deleted

## 2018-05-13 ENCOUNTER — Encounter: Payer: Self-pay | Admitting: *Deleted

## 2018-05-13 VITALS — BP 104/63 | Wt 96.6 lb

## 2018-05-13 DIAGNOSIS — O365990 Maternal care for other known or suspected poor fetal growth, unspecified trimester, not applicable or unspecified: Secondary | ICD-10-CM

## 2018-05-13 DIAGNOSIS — O34219 Maternal care for unspecified type scar from previous cesarean delivery: Secondary | ICD-10-CM | POA: Diagnosis not present

## 2018-05-13 DIAGNOSIS — O0993 Supervision of high risk pregnancy, unspecified, third trimester: Secondary | ICD-10-CM

## 2018-05-13 DIAGNOSIS — Z23 Encounter for immunization: Secondary | ICD-10-CM | POA: Diagnosis not present

## 2018-05-13 DIAGNOSIS — O0990 Supervision of high risk pregnancy, unspecified, unspecified trimester: Secondary | ICD-10-CM

## 2018-05-13 DIAGNOSIS — O365993 Maternal care for other known or suspected poor fetal growth, unspecified trimester, fetus 3: Secondary | ICD-10-CM

## 2018-05-13 DIAGNOSIS — O98719 Human immunodeficiency virus [HIV] disease complicating pregnancy, unspecified trimester: Secondary | ICD-10-CM

## 2018-05-13 DIAGNOSIS — Z98891 History of uterine scar from previous surgery: Secondary | ICD-10-CM

## 2018-05-13 DIAGNOSIS — D696 Thrombocytopenia, unspecified: Secondary | ICD-10-CM

## 2018-05-13 DIAGNOSIS — O98713 Human immunodeficiency virus [HIV] disease complicating pregnancy, third trimester: Secondary | ICD-10-CM | POA: Diagnosis present

## 2018-05-13 DIAGNOSIS — O99113 Other diseases of the blood and blood-forming organs and certain disorders involving the immune mechanism complicating pregnancy, third trimester: Secondary | ICD-10-CM | POA: Diagnosis not present

## 2018-05-13 LAB — CBC
HEMOGLOBIN: 9.7 g/dL — AB (ref 11.1–15.9)
Hematocrit: 29 % — ABNORMAL LOW (ref 34.0–46.6)
MCH: 27.7 pg (ref 26.6–33.0)
MCHC: 33.4 g/dL (ref 31.5–35.7)
MCV: 83 fL (ref 79–97)
Platelets: 168 10*3/uL (ref 150–450)
RBC: 3.5 x10E6/uL — ABNORMAL LOW (ref 3.77–5.28)
RDW: 11.9 % — AB (ref 12.3–15.4)
WBC: 5.2 10*3/uL (ref 3.4–10.8)

## 2018-05-13 NOTE — Progress Notes (Signed)
   PRENATAL VISIT NOTE  Subjective:  Tamara Briggs is a 32 y.o. [id number] at 7670w2d being seen today for ongoing prenatal care.  She is currently monitored for the following issues for this high-risk pregnancy and has HIV (human immunodeficiency virus infection) (HCC); Schizophrenia (HCC); History of neural tube defect; Secondary Parkinson disease (HCC); Tardive dyskinesia; Supervision of high risk pregnancy, antepartum; History of VBAC; Crack cocaine use; Bipolar disorder (HCC); Protein-calorie malnutrition, severe (HCC); Chlamydia infection affecting pregnancy; Trichomonal vaginitis during pregnancy; Pre-diabetes; HIV affecting pregnancy, antepartum; History of preterm delivery, currently pregnant; History of gestational diabetes mellitus (GDM) in prior pregnancy, currently pregnant; Insulin resistance; Insufficient weight gain during pregnancy; Neonatal death; Insufficient prenatal care; Pregnancy affected by fetal growth restriction; and Gestational thrombocytopenia (HCC) on their problem list.  Patient reports no complaints.  Contractions: Irritability. Vag. Bleeding: None.  Movement: Present. Denies leaking of fluid.   The following portions of the patient's history were reviewed and updated as appropriate: allergies, current medications, past family history, past medical history, past social history, past surgical history and problem list. Problem list updated.  Objective:   Vitals:   05/13/18 1134  BP: 104/63  Weight: 96 lb 9.6 oz (43.8 kg)    Fetal Status: Fetal Heart Rate (bpm): 146   Movement: Present     General:  Alert, oriented and cooperative. Patient is in no acute distress.  Skin: Skin is warm and dry. No rash noted.   Cardiovascular: Normal heart rate noted  Respiratory: Normal respiratory effort, no problems with respiration noted  Abdomen: Soft, gravid, appropriate for gestational age.  Pain/Pressure: Present     Pelvic: Cervical exam deferred        Extremities: Normal  range of motion.  Edema: None  Mental Status: Normal mood and affect. Normal behavior. Normal judgment and thought content.   Assessment and Plan:  Pregnancy: [id number] at 8470w2d  1. HIV affecting pregnancy, antepartum Follow by ID. Last count ~300, on medas. Continue growth scans.  2. Pregnancy affected by fetal growth restriction Followed by MFM, continue growth scans and recommended delivery plan.  3. History of VBAC Counseled regarding TOLAC vs RCS; risks/benefits discussed in detail. All questions answered.  Patient elects for TOLAC, consent signed 05/13/2018.  4. Benign gestational thrombocytopenia in third trimester [facility](HCC) Was 117K last check, will follow up today. - CBC  5. Supervision of high risk pregnancy, antepartum Vaccines given today. Depo Provera for contraception. - Tdap vaccine greater than or equal to 7yo IM - Flu Vaccine QUAD 36+ mos IM (Fluarix, Quad PF)  Preterm labor symptoms and general obstetric precautions including but not limited to vaginal bleeding, contractions, leaking of fluid and fetal movement were reviewed in detail with the patient. Please refer to After Visit Summary for other counseling recommendations.  Return in about 2 weeks (around 05/27/2018) for OB Visit (HOB).  Future Appointments  Date Time Provider Department Center  05/16/2018  2:30 PM Judyann MunsonSnider, Cynthia, MD [facility]  05/18/2018  1:30 PM [facility] US 1 [facility]  06/14/2018  2:30 PM York SpanielWillis, Charles K, MD [facility] None    Jaynie CollinsUgonna Lezli Danek, MD

## 2018-05-13 NOTE — Telephone Encounter (Signed)
Spoke with Dr Drue SecondSnider today.  Tamara Briggs only has 8 weeks remaining in her pregnancy so Dr Drue SecondSnider wants to closely monitor Tamara Briggs. Tamara Briggs already has an appt scheduled for Monday at 2:30 so I called, left a VM, and texted Tamara Briggs reminding her of the appt. Tamara Briggs responded back via text stating "I will be there"

## 2018-05-13 NOTE — Progress Notes (Signed)
VBAC SIGNED ON 05/13/18

## 2018-05-13 NOTE — Patient Instructions (Signed)
Return to clinic for any scheduled appointments or obstetric concerns, or go to MAU for evaluation  Research childbirth classes and hospital preregistration at [email or web address]  Fetal Movement Counts Patient Name: ________________________________________________ Patient Due Date: ____________________ What is a fetal movement count? A fetal movement count is the number of times that you feel your baby move during a certain amount of time. This may also be called a fetal kick count. A fetal movement count is recommended for every pregnant woman. You may be asked to start counting fetal movements as early as week 28 of your pregnancy. Pay attention to when your baby is most active. You may notice your baby's sleep and wake cycles. You may also notice things that make your baby move more. You should do a fetal movement count:  When your baby is normally most active.  At the same time each day.  A good time to count movements is while you are resting, after having something to eat and drink. How do I count fetal movements? 1. Find a quiet, comfortable area. Sit, or lie down on your side. 2. Write down the date, the start time and stop time, and the number of movements that you felt between those two times. Take this information with you to your health care visits. 3. For 2 hours, count kicks, flutters, swishes, rolls, and jabs. You should feel at least 10 movements during 2 hours. 4. You may stop counting after you have felt 10 movements. 5. If you do not feel 10 movements in 2 hours, have something to eat and drink. Then, keep resting and counting for 1 hour. If you feel at least 4 movements during that hour, you may stop counting. Contact a health care provider if:  You feel fewer than 4 movements in 2 hours.  Your baby is not moving like he or she usually does. Date: ____________ Start time: ____________ Stop time: ____________ Movements: ____________ Date: ____________ Start time:  ____________ Stop time: ____________ Movements: ____________ Date: ____________ Start time: ____________ Stop time: ____________ Movements: ____________ Date: ____________ Start time: ____________ Stop time: ____________ Movements: ____________ Date: ____________ Start time: ____________ Stop time: ____________ Movements: ____________ Date: ____________ Start time: ____________ Stop time: ____________ Movements: ____________ Date: ____________ Start time: ____________ Stop time: ____________ Movements: ____________ Date: ____________ Start time: ____________ Stop time: ____________ Movements: ____________ Date: ____________ Start time: ____________ Stop time: ____________ Movements: ____________ This information is not intended to replace advice given to you by your health care provider. Make sure you discuss any questions you have with your health care provider. Document Released: 09/09/2006 Document Revised: 04/08/2016 Document Reviewed: 09/19/2015 Elsevier Interactive Patient Education  2018 [employer]  Braxton Hicks Contractions Contractions of the uterus can occur throughout pregnancy, but they are not always a sign that you are in labor. You may have practice contractions called Braxton Hicks contractions. These false labor contractions are sometimes confused with true labor. What are Braxton Hicks contractions? Braxton Hicks contractions are tightening movements that occur in the muscles of the uterus before labor. Unlike true labor contractions, these contractions do not result in opening (dilation) and thinning of the cervix. Toward the end of pregnancy (32-34 weeks), Braxton Hicks contractions can happen more often and may become stronger. These contractions are sometimes difficult to tell apart from true labor because they can be very uncomfortable. You should not feel embarrassed if you go to the hospital with false labor. Sometimes, the only way to tell if you are in true labor is    for your health care provider to look for changes in the cervix. The health care provider will do a physical exam and may monitor your contractions. If you are not in true labor, the exam should show that your cervix is not dilating and your water has not broken. If there are other health problems associated with your pregnancy, it is completely safe for you to be sent home with false labor. You may continue to have Braxton Hicks contractions until you go into true labor. How to tell the difference between true labor and false labor True labor  Contractions last 30-70 seconds.  Contractions become very regular.  Discomfort is usually felt in the top of the uterus, and it spreads to the lower abdomen and low back.  Contractions do not go away with walking.  Contractions usually become more intense and increase in frequency.  The cervix dilates and gets thinner. False labor  Contractions are usually shorter and not as strong as true labor contractions.  Contractions are usually irregular.  Contractions are often felt in the front of the lower abdomen and in the groin.  Contractions may go away when you walk around or change positions while lying down.  Contractions get weaker and are shorter-lasting as time goes on.  The cervix usually does not dilate or become thin. Follow these instructions at home:  Take over-the-counter and prescription medicines only as told by your health care provider.  Keep up with your usual exercises and follow other instructions from your health care provider.  Eat and drink lightly if you think you are going into labor.  If Braxton Hicks contractions are making you uncomfortable: ? Change your position from lying down or resting to walking, or change from walking to resting. ? Sit and rest in a tub of warm water. ? Drink enough fluid to keep your urine pale yellow. Dehydration may cause these contractions. ? Do slow and deep breathing several times  an hour.  Keep all follow-up prenatal visits as told by your health care provider. This is important. Contact a health care provider if:  You have a fever.  You have continuous pain in your abdomen. Get help right away if:  Your contractions become stronger, more regular, and closer together.  You have fluid leaking or gushing from your vagina.  You pass blood-tinged mucus (bloody show).  You have bleeding from your vagina.  You have low back pain that you never had before.  You feel your baby's head pushing down and causing pelvic pressure.  Your baby is not moving inside you as much as it used to. Summary  Contractions that occur before labor are called Braxton Hicks contractions, false labor, or practice contractions.  Braxton Hicks contractions are usually shorter, weaker, farther apart, and less regular than true labor contractions. True labor contractions usually become progressively stronger and regular and they become more frequent.  Manage discomfort from Braxton Hicks contractions by changing position, resting in a warm bath, drinking plenty of water, or practicing deep breathing. This information is not intended to replace advice given to you by your health care provider. Make sure you discuss any questions you have with your health care provider. Document Released: 12/24/2016 Document Revised: 12/24/2016 Document Reviewed: 12/24/2016 Elsevier Interactive Patient Education  2018 Elsevier Inc.    

## 2018-05-13 NOTE — Telephone Encounter (Signed)
Contacted Tamara Briggs today and she expressed a desire to be engaged with me again. We plan to meeting later today for consents to be signed.

## 2018-05-16 ENCOUNTER — Ambulatory Visit: Payer: Medicaid Other | Admitting: Internal Medicine

## 2018-05-18 ENCOUNTER — Ambulatory Visit (HOSPITAL_COMMUNITY)
Admission: RE | Admit: 2018-05-18 | Discharge: 2018-05-18 | Disposition: A | Discharge status: Home / Self Care | Payer: Medicaid Other | Source: Ambulatory Visit | Attending: Obstetrics & Gynecology | Admitting: Obstetrics & Gynecology

## 2018-05-18 ENCOUNTER — Other Ambulatory Visit (HOSPITAL_COMMUNITY): Payer: Self-pay | Admitting: Maternal and Fetal Medicine

## 2018-05-18 DIAGNOSIS — O34219 Maternal care for unspecified type scar from previous cesarean delivery: Secondary | ICD-10-CM | POA: Diagnosis not present

## 2018-05-18 DIAGNOSIS — Z362 Encounter for other antenatal screening follow-up: Secondary | ICD-10-CM

## 2018-05-18 DIAGNOSIS — O09299 Supervision of pregnancy with other poor reproductive or obstetric history, unspecified trimester: Secondary | ICD-10-CM

## 2018-05-18 DIAGNOSIS — Z8632 Personal history of gestational diabetes: Secondary | ICD-10-CM

## 2018-05-18 DIAGNOSIS — O09219 Supervision of pregnancy with history of pre-term labor, unspecified trimester: Secondary | ICD-10-CM

## 2018-05-18 DIAGNOSIS — O98713 Human immunodeficiency virus [HIV] disease complicating pregnancy, third trimester: Secondary | ICD-10-CM | POA: Insufficient documentation

## 2018-05-18 DIAGNOSIS — O99333 Smoking (tobacco) complicating pregnancy, third trimester: Secondary | ICD-10-CM | POA: Diagnosis not present

## 2018-05-18 DIAGNOSIS — O365930 Maternal care for other known or suspected poor fetal growth, third trimester, not applicable or unspecified: Secondary | ICD-10-CM | POA: Diagnosis not present

## 2018-05-18 DIAGNOSIS — Z3A32 32 weeks gestation of pregnancy: Secondary | ICD-10-CM | POA: Diagnosis not present

## 2018-05-18 DIAGNOSIS — O365990 Maternal care for other known or suspected poor fetal growth, unspecified trimester, not applicable or unspecified: Secondary | ICD-10-CM

## 2018-05-18 DIAGNOSIS — F1410 Cocaine abuse, uncomplicated: Secondary | ICD-10-CM | POA: Diagnosis not present

## 2018-05-18 DIAGNOSIS — O09899 Supervision of other high risk pregnancies, unspecified trimester: Secondary | ICD-10-CM

## 2018-05-18 DIAGNOSIS — O98819 Other maternal infectious and parasitic diseases complicating pregnancy, unspecified trimester: Secondary | ICD-10-CM

## 2018-05-18 DIAGNOSIS — O09213 Supervision of pregnancy with history of pre-term labor, third trimester: Secondary | ICD-10-CM | POA: Diagnosis not present

## 2018-05-18 DIAGNOSIS — O99323 Drug use complicating pregnancy, third trimester: Secondary | ICD-10-CM | POA: Insufficient documentation

## 2018-05-18 DIAGNOSIS — O99343 Other mental disorders complicating pregnancy, third trimester: Secondary | ICD-10-CM | POA: Diagnosis not present

## 2018-05-18 DIAGNOSIS — A749 Chlamydial infection, unspecified: Secondary | ICD-10-CM

## 2018-05-18 DIAGNOSIS — O09293 Supervision of pregnancy with other poor reproductive or obstetric history, third trimester: Secondary | ICD-10-CM | POA: Insufficient documentation

## 2018-05-18 DIAGNOSIS — O98719 Human immunodeficiency virus [HIV] disease complicating pregnancy, unspecified trimester: Secondary | ICD-10-CM

## 2018-05-19 ENCOUNTER — Other Ambulatory Visit (HOSPITAL_COMMUNITY): Payer: Self-pay | Admitting: *Deleted

## 2018-05-19 DIAGNOSIS — O365990 Maternal care for other known or suspected poor fetal growth, unspecified trimester, not applicable or unspecified: Secondary | ICD-10-CM

## 2018-05-26 ENCOUNTER — Encounter (HOSPITAL_COMMUNITY): Payer: Self-pay

## 2018-05-26 ENCOUNTER — Ambulatory Visit (HOSPITAL_COMMUNITY): Payer: Medicaid Other

## 2018-05-26 ENCOUNTER — Other Ambulatory Visit (HOSPITAL_COMMUNITY): Payer: Self-pay | Admitting: *Deleted

## 2018-05-26 ENCOUNTER — Other Ambulatory Visit (HOSPITAL_COMMUNITY): Payer: Self-pay | Admitting: Maternal & Fetal Medicine

## 2018-05-26 ENCOUNTER — Ambulatory Visit (HOSPITAL_COMMUNITY)
Admission: RE | Admit: 2018-05-26 | Discharge: 2018-05-26 | Disposition: A | Discharge status: Home / Self Care | Payer: Medicaid Other | Source: Ambulatory Visit | Attending: Obstetrics & Gynecology | Admitting: Obstetrics & Gynecology

## 2018-05-26 DIAGNOSIS — O365990 Maternal care for other known or suspected poor fetal growth, unspecified trimester, not applicable or unspecified: Secondary | ICD-10-CM

## 2018-05-26 DIAGNOSIS — O09293 Supervision of pregnancy with other poor reproductive or obstetric history, third trimester: Secondary | ICD-10-CM

## 2018-05-26 DIAGNOSIS — O34219 Maternal care for unspecified type scar from previous cesarean delivery: Secondary | ICD-10-CM

## 2018-05-26 DIAGNOSIS — O09213 Supervision of pregnancy with history of pre-term labor, third trimester: Secondary | ICD-10-CM

## 2018-05-26 DIAGNOSIS — O98713 Human immunodeficiency virus [HIV] disease complicating pregnancy, third trimester: Secondary | ICD-10-CM

## 2018-05-26 DIAGNOSIS — Z3A33 33 weeks gestation of pregnancy: Secondary | ICD-10-CM | POA: Diagnosis not present

## 2018-05-26 DIAGNOSIS — O99343 Other mental disorders complicating pregnancy, third trimester: Secondary | ICD-10-CM | POA: Diagnosis not present

## 2018-05-26 DIAGNOSIS — O365930 Maternal care for other known or suspected poor fetal growth, third trimester, not applicable or unspecified: Secondary | ICD-10-CM | POA: Insufficient documentation

## 2018-05-26 NOTE — ED Notes (Signed)
Pt here today in [facility] for appointment.  Pt left AMA prior to Dr. Perry Mount speaking with her but after U/S was complete.  RN tried to explain to pt that the MD would like to come talk to her about her U/S today and then we would like to do an NST.  Pt was rocking back and forth and very upset stating she was hungry and needed to eat.  I offered her graham crackers, peanut butter and drink but pt said she already had that.  Pt  States she has another appointment to go to, needed to get real food, appt was made to early in the morning, and has two other children at home.  Pt states she could not stay to have an NST.  Pt walked out of room and left AMA.  Pt did stop by checkout to schedule another appt on 06-02-18 at 1pm prior to leaving.

## 2018-05-27 ENCOUNTER — Ambulatory Visit (INDEPENDENT_AMBULATORY_CARE_PROVIDER_SITE_OTHER): Payer: Medicaid Other | Admitting: Obstetrics and Gynecology

## 2018-05-27 ENCOUNTER — Other Ambulatory Visit (HOSPITAL_COMMUNITY)
Admission: RE | Admit: 2018-05-27 | Discharge: 2018-05-27 | Disposition: A | Discharge status: Home / Self Care | Payer: Medicaid Other | Source: Ambulatory Visit | Attending: Obstetrics and Gynecology | Admitting: Obstetrics and Gynecology

## 2018-05-27 ENCOUNTER — Other Ambulatory Visit: Payer: Medicaid Other | Admitting: *Deleted

## 2018-05-27 VITALS — BP 97/74 | HR 76 | Wt 94.0 lb

## 2018-05-27 DIAGNOSIS — O0993 Supervision of high risk pregnancy, unspecified, third trimester: Secondary | ICD-10-CM | POA: Insufficient documentation

## 2018-05-27 DIAGNOSIS — O09899 Supervision of other high risk pregnancies, unspecified trimester: Secondary | ICD-10-CM

## 2018-05-27 DIAGNOSIS — F319 Bipolar disorder, unspecified: Secondary | ICD-10-CM

## 2018-05-27 DIAGNOSIS — A749 Chlamydial infection, unspecified: Secondary | ICD-10-CM

## 2018-05-27 DIAGNOSIS — O98813 Other maternal infectious and parasitic diseases complicating pregnancy, third trimester: Secondary | ICD-10-CM

## 2018-05-27 DIAGNOSIS — O0990 Supervision of high risk pregnancy, unspecified, unspecified trimester: Secondary | ICD-10-CM

## 2018-05-27 DIAGNOSIS — O23593 Infection of other part of genital tract in pregnancy, third trimester: Secondary | ICD-10-CM

## 2018-05-27 DIAGNOSIS — O09219 Supervision of pregnancy with history of pre-term labor, unspecified trimester: Secondary | ICD-10-CM

## 2018-05-27 DIAGNOSIS — R7303 Prediabetes: Secondary | ICD-10-CM

## 2018-05-27 DIAGNOSIS — A5901 Trichomonal vulvovaginitis: Secondary | ICD-10-CM | POA: Diagnosis not present

## 2018-05-27 DIAGNOSIS — O365990 Maternal care for other known or suspected poor fetal growth, unspecified trimester, not applicable or unspecified: Secondary | ICD-10-CM

## 2018-05-27 DIAGNOSIS — Z3A33 33 weeks gestation of pregnancy: Secondary | ICD-10-CM | POA: Diagnosis not present

## 2018-05-27 DIAGNOSIS — O98719 Human immunodeficiency virus [HIV] disease complicating pregnancy, unspecified trimester: Secondary | ICD-10-CM

## 2018-05-27 DIAGNOSIS — O2613 Low weight gain in pregnancy, third trimester: Secondary | ICD-10-CM

## 2018-05-27 DIAGNOSIS — F1490 Cocaine use, unspecified, uncomplicated: Secondary | ICD-10-CM

## 2018-05-27 DIAGNOSIS — Z98891 History of uterine scar from previous surgery: Secondary | ICD-10-CM

## 2018-05-27 LAB — OB RESULTS CONSOLE GC/CHLAMYDIA: GC PROBE AMP, GENITAL: NEGATIVE

## 2018-05-27 NOTE — Progress Notes (Signed)
Prenatal Visit Note Date: 05/27/2018 Clinic: [facility]  Subjective:  Tamara Briggs is a 32 y.o. [id number] at [redacted]w[redacted]d being seen today for ongoing prenatal care.  She is currently monitored for the following issues for this high-risk pregnancy and has HIV (human immunodeficiency virus infection) (HCC); Schizophrenia (HCC); History of neural tube defect; Secondary Parkinson disease (HCC); Tardive dyskinesia; Supervision of high risk pregnancy, antepartum; History of VBAC; Crack cocaine use; Bipolar disorder (HCC); Protein-calorie malnutrition, severe (HCC); Chlamydia infection affecting pregnancy; Trichomonal vaginitis during pregnancy; Pre-diabetes; HIV affecting pregnancy, antepartum; History of preterm delivery, currently pregnant; History of gestational diabetes mellitus (GDM) in prior pregnancy, currently pregnant; Insulin resistance; Insufficient weight gain during pregnancy; Neonatal death; Insufficient prenatal care; and Pregnancy affected by fetal growth restriction on their problem list.  Patient reports no complaints.   Contractions: Irritability. Vag. Bleeding: None.  Movement: Present. Denies leaking of fluid.   The following portions of the patient's history were reviewed and updated as appropriate: allergies, current medications, past family history, past medical history, past social history, past surgical history and problem list. Problem list updated.  Objective:   Vitals:   05/27/18 1145  BP: 97/74  Pulse: 76  Weight: 94 lb (42.6 kg)    Fetal Status: Fetal Heart Rate (bpm): 146   Movement: Present     General:  Alert, oriented and cooperative. Patient is in no acute distress.  Skin: Skin is warm and dry. No rash noted.   Cardiovascular: Normal heart rate noted  Respiratory: Normal respiratory effort, no problems with respiration noted  Abdomen: Soft, gravid, appropriate for gestational age. Pain/Pressure: Present     Pelvic:  Cervical exam deferred         Extremities: Normal range of motion.  Edema: None  Mental Status: Normal mood and affect. Normal behavior. Normal judgment and thought content.   Urinalysis:      Assessment and Plan:  Pregnancy: [id number] at [redacted]w[redacted]d  1. Supervision of high risk pregnancy, antepartum Routine care. Pt doesn't want BTL - Cervicovaginal ancillary only  2. Trichomonal vaginitis during pregnancy in third trimester - Cervicovaginal ancillary only  3. History of VBAC tolac consent already signed  4. Crack cocaine use UDS on admission  5. Bipolar affective disorder, remission status unspecified (HCC) Confirms on latuda and zoloft, followed by monarc. Pt's mood is good today  6. HIV affecting pregnancy, antepartum Followed by ID. Next f/u on 10/14  7. History of preterm delivery, currently pregnant Declined 17p  8. Insufficient weight gain during pregnancy in third trimester Weight stable at 94-96lbs. Pt declines nutrition consult  9. Pregnancy affected by fetal growth restriction bpp 8/8 yesterday with normal SD dopplers. Pt left ama before nst. Has qwk testing already set up.   10. Neonatal death ?cardiac related. Negative fetal echo earlier this month  11. Chlamydia infection affecting pregnancy in third trimester toc today - Cervicovaginal ancillary only  Preterm labor symptoms and general obstetric precautions including but not limited to vaginal bleeding, contractions, leaking of fluid and fetal movement were reviewed in detail with the patient. Please refer to After Visit Summary for other counseling recommendations.  No follow-ups on file.   [place] Bing, MD

## 2018-05-30 LAB — CERVICOVAGINAL ANCILLARY ONLY
Chlamydia: NEGATIVE
Neisseria Gonorrhea: NEGATIVE
Trichomonas: POSITIVE — AB

## 2018-06-01 ENCOUNTER — Telehealth: Payer: Self-pay | Admitting: *Deleted

## 2018-06-01 DIAGNOSIS — A5901 Trichomonal vulvovaginitis: Secondary | ICD-10-CM

## 2018-06-01 DIAGNOSIS — O23599 Infection of other part of genital tract in pregnancy, unspecified trimester: Principal | ICD-10-CM

## 2018-06-01 MED ORDER — METRONIDAZOLE 500 MG PO TABS: 2000.0000 mg | ORAL_TABLET | Freq: Once | ORAL | 0 refills | Status: AC

## 2018-06-01 NOTE — Telephone Encounter (Signed)
-----   Message from [place] Bing, MD sent at 05/31/2018 10:24 AM EDT ----- Please call her in abx per protocol. thanks

## 2018-06-01 NOTE — Telephone Encounter (Signed)
Called pt to inform her of +trich and that flagyl 2g for 1 dose was called into the [facility]  Pt did not pick up.  Voicemail left requesting that the pt call back to discuss results.

## 2018-06-02 ENCOUNTER — Ambulatory Visit (HOSPITAL_COMMUNITY)
Admission: RE | Admit: 2018-06-02 | Discharge: 2018-06-02 | Disposition: A | Discharge status: Home / Self Care | Payer: Medicaid Other | Source: Ambulatory Visit | Attending: Obstetrics & Gynecology | Admitting: Obstetrics & Gynecology

## 2018-06-02 ENCOUNTER — Encounter (HOSPITAL_COMMUNITY): Payer: Self-pay

## 2018-06-02 ENCOUNTER — Ambulatory Visit (HOSPITAL_COMMUNITY): Payer: Medicaid Other

## 2018-06-02 DIAGNOSIS — O99343 Other mental disorders complicating pregnancy, third trimester: Secondary | ICD-10-CM

## 2018-06-02 DIAGNOSIS — O98713 Human immunodeficiency virus [HIV] disease complicating pregnancy, third trimester: Secondary | ICD-10-CM | POA: Diagnosis not present

## 2018-06-02 DIAGNOSIS — O09213 Supervision of pregnancy with history of pre-term labor, third trimester: Secondary | ICD-10-CM | POA: Diagnosis not present

## 2018-06-02 DIAGNOSIS — A749 Chlamydial infection, unspecified: Secondary | ICD-10-CM

## 2018-06-02 DIAGNOSIS — F1410 Cocaine abuse, uncomplicated: Secondary | ICD-10-CM | POA: Diagnosis not present

## 2018-06-02 DIAGNOSIS — Z3A34 34 weeks gestation of pregnancy: Secondary | ICD-10-CM

## 2018-06-02 DIAGNOSIS — O99323 Drug use complicating pregnancy, third trimester: Secondary | ICD-10-CM | POA: Insufficient documentation

## 2018-06-02 DIAGNOSIS — O34219 Maternal care for unspecified type scar from previous cesarean delivery: Secondary | ICD-10-CM

## 2018-06-02 DIAGNOSIS — O98719 Human immunodeficiency virus [HIV] disease complicating pregnancy, unspecified trimester: Secondary | ICD-10-CM

## 2018-06-02 DIAGNOSIS — O09293 Supervision of pregnancy with other poor reproductive or obstetric history, third trimester: Secondary | ICD-10-CM | POA: Diagnosis not present

## 2018-06-02 DIAGNOSIS — O99333 Smoking (tobacco) complicating pregnancy, third trimester: Secondary | ICD-10-CM | POA: Diagnosis not present

## 2018-06-02 DIAGNOSIS — O98813 Other maternal infectious and parasitic diseases complicating pregnancy, third trimester: Secondary | ICD-10-CM

## 2018-06-02 DIAGNOSIS — O09899 Supervision of other high risk pregnancies, unspecified trimester: Secondary | ICD-10-CM

## 2018-06-02 DIAGNOSIS — O09219 Supervision of pregnancy with history of pre-term labor, unspecified trimester: Secondary | ICD-10-CM

## 2018-06-02 DIAGNOSIS — Z21 Asymptomatic human immunodeficiency virus [HIV] infection status: Secondary | ICD-10-CM

## 2018-06-02 DIAGNOSIS — O365990 Maternal care for other known or suspected poor fetal growth, unspecified trimester, not applicable or unspecified: Secondary | ICD-10-CM | POA: Diagnosis not present

## 2018-06-02 DIAGNOSIS — Z8632 Personal history of gestational diabetes: Secondary | ICD-10-CM

## 2018-06-02 DIAGNOSIS — O09299 Supervision of pregnancy with other poor reproductive or obstetric history, unspecified trimester: Secondary | ICD-10-CM

## 2018-06-02 NOTE — Telephone Encounter (Signed)
Called patient, no answer- left message stating we are trying to reach you with results, please call us back as soon as possible. Will send letter.

## 2018-06-02 NOTE — Procedures (Signed)
Tamara Briggs 12-25-1985 [redacted]w[redacted]d  Fetus A Non-Stress Test Interpretation for 06/02/18  Indication: IUGR  Fetal Heart Rate A Mode: External Baseline Rate (A): 145 bpm Variability: Moderate Accelerations: 15 x 15 Decelerations: None Multiple birth?: No  Uterine Activity Mode: Toco Contraction Frequency (min): None Resting Tone Palpated: Relaxed Resting Time: Adequate  Interpretation (Fetal Testing) Nonstress Test Interpretation: Reactive Overall Impression: Reassuring for gestational age Comments: EFM tracing reviewed by Dr. Perry Mount

## 2018-06-06 ENCOUNTER — Encounter (HOSPITAL_COMMUNITY): Payer: Self-pay

## 2018-06-06 ENCOUNTER — Inpatient Hospital Stay (HOSPITAL_COMMUNITY)
Admission: AD | Admit: 2018-06-06 | Discharge: 2018-06-10 | DRG: 805 | Disposition: A | Discharge status: Home / Self Care | Payer: Medicaid Other | Attending: Family Medicine | Admitting: Family Medicine

## 2018-06-06 ENCOUNTER — Telehealth: Payer: Self-pay | Admitting: Behavioral Health

## 2018-06-06 ENCOUNTER — Ambulatory Visit (INDEPENDENT_AMBULATORY_CARE_PROVIDER_SITE_OTHER): Payer: Medicaid Other | Admitting: Internal Medicine

## 2018-06-06 DIAGNOSIS — B20 Human immunodeficiency virus [HIV] disease: Secondary | ICD-10-CM | POA: Diagnosis present

## 2018-06-06 DIAGNOSIS — O9872 Human immunodeficiency virus [HIV] disease complicating childbirth: Secondary | ICD-10-CM | POA: Diagnosis present

## 2018-06-06 DIAGNOSIS — Z87728 Personal history of other specified (corrected) congenital malformations of nervous system and sense organs: Secondary | ICD-10-CM

## 2018-06-06 DIAGNOSIS — E43 Unspecified severe protein-calorie malnutrition: Secondary | ICD-10-CM | POA: Diagnosis present

## 2018-06-06 DIAGNOSIS — O98719 Human immunodeficiency virus [HIV] disease complicating pregnancy, unspecified trimester: Secondary | ICD-10-CM | POA: Diagnosis present

## 2018-06-06 DIAGNOSIS — A5901 Trichomonal vulvovaginitis: Secondary | ICD-10-CM | POA: Diagnosis present

## 2018-06-06 DIAGNOSIS — O99324 Drug use complicating childbirth: Secondary | ICD-10-CM | POA: Diagnosis present

## 2018-06-06 DIAGNOSIS — O99824 Streptococcus B carrier state complicating childbirth: Secondary | ICD-10-CM | POA: Diagnosis present

## 2018-06-06 DIAGNOSIS — O34219 Maternal care for unspecified type scar from previous cesarean delivery: Secondary | ICD-10-CM | POA: Diagnosis present

## 2018-06-06 DIAGNOSIS — Z3A34 34 weeks gestation of pregnancy: Secondary | ICD-10-CM | POA: Diagnosis not present

## 2018-06-06 DIAGNOSIS — O9832 Other infections with a predominantly sexual mode of transmission complicating childbirth: Secondary | ICD-10-CM | POA: Diagnosis present

## 2018-06-06 DIAGNOSIS — O99334 Smoking (tobacco) complicating childbirth: Secondary | ICD-10-CM | POA: Diagnosis present

## 2018-06-06 DIAGNOSIS — O252 Malnutrition in childbirth: Secondary | ICD-10-CM | POA: Diagnosis present

## 2018-06-06 DIAGNOSIS — Z21 Asymptomatic human immunodeficiency virus [HIV] infection status: Secondary | ICD-10-CM | POA: Diagnosis present

## 2018-06-06 DIAGNOSIS — F1410 Cocaine abuse, uncomplicated: Secondary | ICD-10-CM | POA: Diagnosis present

## 2018-06-06 DIAGNOSIS — O365990 Maternal care for other known or suspected poor fetal growth, unspecified trimester, not applicable or unspecified: Secondary | ICD-10-CM | POA: Diagnosis present

## 2018-06-06 DIAGNOSIS — F209 Schizophrenia, unspecified: Secondary | ICD-10-CM | POA: Diagnosis present

## 2018-06-06 DIAGNOSIS — O99344 Other mental disorders complicating childbirth: Secondary | ICD-10-CM | POA: Diagnosis present

## 2018-06-06 DIAGNOSIS — O42913 Preterm premature rupture of membranes, unspecified as to length of time between rupture and onset of labor, third trimester: Secondary | ICD-10-CM | POA: Diagnosis present

## 2018-06-06 DIAGNOSIS — O42919 Preterm premature rupture of membranes, unspecified as to length of time between rupture and onset of labor, unspecified trimester: Secondary | ICD-10-CM | POA: Diagnosis present

## 2018-06-06 DIAGNOSIS — F1210 Cannabis abuse, uncomplicated: Secondary | ICD-10-CM | POA: Diagnosis present

## 2018-06-06 DIAGNOSIS — F17210 Nicotine dependence, cigarettes, uncomplicated: Secondary | ICD-10-CM | POA: Diagnosis present

## 2018-06-06 DIAGNOSIS — O4202 Full-term premature rupture of membranes, onset of labor within 24 hours of rupture: Secondary | ICD-10-CM | POA: Diagnosis not present

## 2018-06-06 DIAGNOSIS — Z3A35 35 weeks gestation of pregnancy: Secondary | ICD-10-CM | POA: Diagnosis not present

## 2018-06-06 DIAGNOSIS — G219 Secondary parkinsonism, unspecified: Secondary | ICD-10-CM | POA: Diagnosis present

## 2018-06-06 DIAGNOSIS — Z98891 History of uterine scar from previous surgery: Secondary | ICD-10-CM

## 2018-06-06 LAB — TYPE AND SCREEN
ABO/RH(D): O POS
Antibody Screen: NEGATIVE

## 2018-06-06 LAB — CBC
HEMATOCRIT: 32.8 % — AB (ref 36.0–46.0)
Hemoglobin: 10.7 g/dL — ABNORMAL LOW (ref 12.0–15.0)
MCH: 28.9 pg (ref 26.0–34.0)
MCHC: 32.6 g/dL (ref 30.0–36.0)
MCV: 88.6 fL (ref 80.0–100.0)
NRBC: 0 % (ref 0.0–0.2)
Platelets: 117 10*3/uL — ABNORMAL LOW (ref 150–400)
RBC: 3.7 MIL/uL — ABNORMAL LOW (ref 3.87–5.11)
RDW: 13.5 % (ref 11.5–15.5)
WBC: 5.4 10*3/uL (ref 4.0–10.5)

## 2018-06-06 LAB — AMNISURE RUPTURE OF MEMBRANE (ROM) NOT AT ARMC: AMNISURE: POSITIVE

## 2018-06-06 MED ORDER — SOD CITRATE-CITRIC ACID 500-334 MG/5ML PO SOLN: 30.0000 mL | ORAL | Status: DC | PRN

## 2018-06-06 MED ORDER — FENTANYL CITRATE (PF) 100 MCG/2ML IJ SOLN: 100.0000 ug | INTRAMUSCULAR | Status: DC | PRN

## 2018-06-06 MED ORDER — DORAVIRINE 100 MG PO TABS: 100.0000 mg | ORAL_TABLET | Freq: Every day | ORAL | Status: DC

## 2018-06-06 MED ORDER — BICTEGRAVIR-EMTRICITAB-TENOFOV 50-200-25 MG PO TABS
1.0000 | ORAL_TABLET | Freq: Every day | ORAL | Status: DC
  Administered 2018-06-07 – 2018-06-10 (×4): 1 via ORAL
  Filled 2018-06-06 (×5): qty 1

## 2018-06-06 MED ORDER — MAGNESIUM SULFATE BOLUS VIA INFUSION
4.0000 g | Freq: Once | INTRAVENOUS | Status: AC
  Administered 2018-06-06: 4 g via INTRAVENOUS
  Filled 2018-06-06: qty 500

## 2018-06-06 MED ORDER — OXYCODONE-ACETAMINOPHEN 5-325 MG PO TABS: 1.0000 | ORAL_TABLET | ORAL | Status: DC | PRN

## 2018-06-06 MED ORDER — MAGNESIUM SULFATE 40 G IN LACTATED RINGERS - SIMPLE
2.0000 g/h | INTRAVENOUS | Status: DC
  Administered 2018-06-06 – 2018-06-07 (×3): 2 g/h via INTRAVENOUS
  Filled 2018-06-06 (×2): qty 500

## 2018-06-06 MED ORDER — AMOXICILLIN 500 MG PO CAPS: 500.0000 mg | ORAL_CAPSULE | Freq: Three times a day (TID) | ORAL | Status: DC

## 2018-06-06 MED ORDER — PENICILLIN G 3 MILLION UNITS IVPB - SIMPLE MED: 3.0000 10*6.[IU] | INTRAVENOUS | Status: DC

## 2018-06-06 MED ORDER — SODIUM CHLORIDE 0.9 % IV SOLN
5.0000 10*6.[IU] | Freq: Once | INTRAVENOUS | Status: AC
  Administered 2018-06-06 (×2): 5 10*6.[IU] via INTRAVENOUS
  Filled 2018-06-06: qty 5

## 2018-06-06 MED ORDER — OXYTOCIN BOLUS FROM INFUSION: 500.0000 mL | Freq: Once | INTRAVENOUS | Status: DC

## 2018-06-06 MED ORDER — LIDOCAINE HCL (PF) 1 % IJ SOLN: 30.0000 mL | INTRAMUSCULAR | Status: DC | PRN

## 2018-06-06 MED ORDER — OXYTOCIN 40 UNITS IN LACTATED RINGERS INFUSION - SIMPLE MED: 2.5000 [IU]/h | INTRAVENOUS | Status: DC

## 2018-06-06 MED ORDER — ONDANSETRON HCL 4 MG/2ML IJ SOLN: 4.0000 mg | Freq: Four times a day (QID) | INTRAMUSCULAR | Status: DC | PRN

## 2018-06-06 MED ORDER — ACETAMINOPHEN 325 MG PO TABS: 650.0000 mg | ORAL_TABLET | ORAL | Status: DC | PRN

## 2018-06-06 MED ORDER — LACTATED RINGERS IV SOLN: 500.0000 mL | INTRAVENOUS | Status: DC | PRN

## 2018-06-06 MED ORDER — METRONIDAZOLE 500 MG PO TABS
2000.0000 mg | ORAL_TABLET | Freq: Once | ORAL | Status: AC
  Administered 2018-06-06: 2000 mg via ORAL
  Filled 2018-06-06: qty 4

## 2018-06-06 MED ORDER — AZITHROMYCIN 250 MG PO TABS
500.0000 mg | ORAL_TABLET | Freq: Every day | ORAL | Status: DC
  Administered 2018-06-07 – 2018-06-09 (×4): 500 mg via ORAL
  Filled 2018-06-06 (×3): qty 2
  Filled 2018-06-06: qty 1
  Filled 2018-06-06: qty 2

## 2018-06-06 MED ORDER — OXYCODONE-ACETAMINOPHEN 5-325 MG PO TABS: 2.0000 | ORAL_TABLET | ORAL | Status: DC | PRN

## 2018-06-06 MED ORDER — LACTATED RINGERS IV SOLN
INTRAVENOUS | Status: DC
  Administered 2018-06-06 – 2018-06-07 (×3): via INTRAVENOUS

## 2018-06-06 MED ORDER — SODIUM CHLORIDE 0.9 % IV SOLN
2.0000 g | Freq: Four times a day (QID) | INTRAVENOUS | Status: DC
  Administered 2018-06-06 – 2018-06-07 (×3): 2 g via INTRAVENOUS
  Filled 2018-06-06: qty 2000
  Filled 2018-06-06 (×2): qty 2

## 2018-06-06 MED ORDER — ZIDOVUDINE 10 MG/ML IV SOLN
2.0000 mg/kg | Freq: Once | INTRAVENOUS | Status: AC
  Administered 2018-06-06: 89 mg via INTRAVENOUS
  Filled 2018-06-06: qty 8.9

## 2018-06-06 MED ORDER — ZIDOVUDINE 10 MG/ML IV SOLN
1.0000 mg/kg/h | INTRAVENOUS | Status: DC
  Administered 2018-06-06 – 2018-06-07 (×5): 1 mg/kg/h via INTRAVENOUS
  Filled 2018-06-06 (×4): qty 40

## 2018-06-06 MED ORDER — BETAMETHASONE SOD PHOS & ACET 6 (3-3) MG/ML IJ SUSP
12.0000 mg | INTRAMUSCULAR | Status: AC
  Administered 2018-06-06 – 2018-06-07 (×2): 12 mg via INTRAMUSCULAR
  Filled 2018-06-06 (×2): qty 2

## 2018-06-06 NOTE — Progress Notes (Signed)
This note also relates to the following rows which could not be included: BP - Cannot attach notes to unvalidated device data Pulse Rate - Cannot attach notes to unvalidated device data  Mag 4 gm bolus started

## 2018-06-06 NOTE — Progress Notes (Signed)
RN Spoke with Dr. Earlene Plater in department regarding pt situation. RN voiced concern regarding why the pt has not been started with her augmentation given her gestation of 34w 5d, the use of MgSO4 to delay PTL, being ruptured and HIV+. Dr. Earlene Plater asked RN if they would like to speak with Dr. Vergie Living, RN agreed and RN spoke with Dr. Vergie Living.   RN voiced concern about baby contracting HIV the longer the pt is pregnant. RN voiced understanding that baby's lungs are not fully developed, but the pt has received their first dose of BMZ.   [place] asked RN if they were uncomfortable providing care for this pt. RN stated "I have no issues caring for this pt" but RN is uncomfortable with the management of care. Dr. Vergie Living asked RN if they would like him to speak with the MFM attending, RN stated they would like that to be done.

## 2018-06-06 NOTE — Anesthesia Pain Management Evaluation Note (Signed)
  CRNA Pain Management Visit Note  Patient: 29, 32 y.o., female  "Hello I am a member of the anesthesia team at [facility]. We have an anesthesia team available at all times to provide care throughout the hospital, including epidural management and anesthesia for C-section. I don't know your plan for the delivery whether it a natural birth, water birth, IV sedation, nitrous supplementation, doula or epidural, but we want to meet your pain goals."   1.Was your pain managed to your expectations on prior hospitalizations?   Yes   2.What is your expectation for pain management during this hospitalization?     Labor support without medications  3.How can we help you reach that goal? natural  Record the patient's initial score and the patient's pain goal.   Pain: 0  Pain Goal: 10 [facility] wants you to be able to say your pain was always managed very well.  Isaah Furry 06/06/2018

## 2018-06-06 NOTE — Telephone Encounter (Signed)
Patient came in today for a scheduled visit and while waiting in the lobby her water broke.  Patient was placed on a stretcher and 911 called.  Patients sister and step father called and made aware Tamara Briggs would be going to [facility] hospital  [facility] Admissions spoke with Charge nurse Dois Davenport informed her patient was HIV positive and would need the HIV protocol (AZT).  Dois Davenport verbalized understanding.

## 2018-06-06 NOTE — Progress Notes (Signed)
L&D Note I spoke with Dr. Perry Mount with MFM and he is in agreement with plan to start augmentation once 2nd dose of BMZ is given 24hr s/p 1st dose, given VL <1000 during pregnancy and patient taking her outpatient meds.   Cornelia Copa MD Attending Center for [employer] (Faculty Practice) 06/06/2018 Time: 2051

## 2018-06-06 NOTE — Progress Notes (Signed)
LABOR PROGRESS NOTE  Tamara Briggs is a 32 y.o. [id number] at [redacted]w[redacted]d  admitted for IOL 2/2 PROM  Subjective: Patient stating she feels cold, otherwise doing well without questions or concerns. [place] confirmation of cephalic position with supervision by Dr. Earlene Plater.   Objective: BP 109/67 (BP Location: Right Arm)   Pulse 86   Temp 98.3 F (36.8 C) (Oral)   Resp 16   LMP  (LMP Unknown)   SpO2 99%  or  Vitals:   06/06/18 2003 06/06/18 2030 06/06/18 2100 06/06/18 2130  BP: 118/74 121/83 113/63 109/67  Pulse: 78 68 81 86  Resp: 15 16 15 16   Temp: 98.3 F (36.8 C)     TempSrc: Oral     SpO2:  100% 100% 99%    Dilation: 4 Effacement (%): 80 Station: -1 Exam by:: Polos RN FHT: baseline rate 135, moderate varibility, +acel, -decel Toco: irregular   Labs: Lab Results  Component Value Date   WBC 5.4 06/06/2018   HGB 10.7 (L) 06/06/2018   HCT 32.8 (L) 06/06/2018   MCV 88.6 06/06/2018   PLT 117 (L) 06/06/2018    Patient Active Problem List   Diagnosis Date Noted  . Preterm premature rupture of membranes 06/06/2018  . Pregnancy affected by fetal growth restriction 04/28/2018  . Insufficient prenatal care 08-May-2018  . Neonatal death 04-11-18  . Insufficient weight gain during pregnancy 03/02/2018  . 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 HIV affecting pregnancy, antepartum 01/19/2018  . History of preterm delivery, currently pregnant 01/19/2018  . History of gestational diabetes mellitus (GDM) in prior pregnancy, currently pregnant 01/19/2018  . Pre-diabetes 01/13/2018  . Chlamydia infection affecting pregnancy 01/10/2018  . Trichomonal vaginitis during pregnancy 01/10/2018  . Protein-calorie malnutrition, severe (HCC) 11/24/2017  . Crack cocaine use   . Bipolar disorder (HCC)   . History of VBAC 08/31/2016  . Supervision of high risk pregnancy, antepartum 08/06/2016  . 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 Tardive dyskinesia 06/13/2014  . History of neural tube defect  11/16/2013  . Schizophrenia (HCC) 10/18/2013  . HIV (human immunodeficiency virus infection) (HCC) 07/27/2011    Assessment / Plan: 32 y.o. [id number] at [redacted]w[redacted]d here for IOL 2/2 PROM  Labor: per consultation with DR. Perry Mount, MFM, will plan to start augmentation only after 2nd dose of BMZ (due at 2027 06/07/32) Fetal Wellbeing:  Cat 1 I/D: Flagyl for trichomonas infection Pain Control:  Per patient preference  Anticipated MOD:  SVD  Oralia Manis, DO PGY-2 06/06/2018, 10:36 PM

## 2018-06-06 NOTE — MAU Note (Signed)
Pt is a G5P2 at 34.5 with short interval pregnancy less than 1 year.  Pt reports possible SROM at 1530.  NO VB, good fetal movement and possible LOF Pregnancy is complicated by previous c-section and HIV infection.

## 2018-06-06 NOTE — H&P (Signed)
Analy Briggs is a 32 y.o. female presenting for leaking of fluid which started while she was at the Infectious Disease office.  Was seen by Dr .Ilsa Iha.  Is having intermittent painful contractions. RN checked her due to pt's discomfort, concerned about imminent delivery.    Pregnancy has been followed at the [facility] and rather complicated.  Please see problem list below Admits to using Cocaine yesterday afternoon.  Question about proper treatment of Trich infection.  RN Note: Pt is a G5P2 at 34.5 with short interval pregnancy less than 1 year.  Pt reports possible SROM at 1530.  NO VB, good fetal movement and possible LOF Pregnancy is complicated by previous c-section and HIV infection.    OB History    Gravida  5   Para  3   Term  1   Preterm  2   AB  1   Living  2     SAB  1   TAB  0   Ectopic  0   Multiple  0   Live Births  3          Past Medical History:  Diagnosis Date  . Anxiety   . Bipolar disorder (HCC)   . Crack cocaine use   . Depression   . h/o crack Cocaine abuse 03/15/2013   UDS positive cocaine  . 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  AFP increased and sent to genetics.  Amnio declined.  [place] nml.   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 Infection   . Otitis media of left ear 03/15/2013  . Schizophrenia (HCC)   . 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 Tardive dyskinesia 06/13/2014  . Tobacco abuse 03/15/2013   Past Surgical History:  Procedure Laterality Date  . CESAREAN SECTION N/A 12/24/2013   Procedure: CESAREAN SECTION;  Surgeon: Lesly Dukes, MD;  Location: [facility];  Service: Obstetrics;  Laterality: N/A;  . COLPOSCOPY  2009  . DENTAL RESTORATION/EXTRACTION WITH X-RAY     Family History: family history includes Cancer in her father; Diabetes in her mother; High blood pressure in her mother. Social History:  reports that she has been smoking cigarettes. She has a 0.30 pack-year  smoking history. She has never used smokeless tobacco. She reports that she has current or past drug history. Drugs: Cocaine and Marijuana. Frequency: 1.00 time per week. She reports that she does not drink alcohol.     Maternal Diabetes: No Genetic Screening: Declined Maternal Ultrasounds/Referrals: Abnormal:  Findings:   Other: AC is < 10%ile Fetal Ultrasounds or other Referrals:  None Maternal Substance Abuse:  Yes:  Type: Marijuana, Cocaine Significant Maternal Medications:  Meds include: Other: HIV meds (see list) Significant Maternal Lab Results:  Lab values include: HIV positive, Other:  Other Comments:  Multiple medical problems, HIV, cocaine and THC use, poor compliance with prenatal care, secondary Parkinsons, History of NTD (pt herself, lower)  ROS History Dilation: 4 Effacement (%): 80 Station: -1 Exam by:: Polos RN Temperature 98.2 F (36.8 C), temperature source Oral, resp. rate 18, not currently breastfeeding. Exam Physical Exam  Prenatal labs: ABO, Rh: O/Positive/-- (05/15 1646) Antibody: Negative (05/15 1646) Rubella: 2.06 (05/15 1646) RPR: Non Reactive (09/05 0824)  HBsAg: Negative (05/15 1646)  HIV: Reactive (09/05 0824)  GBS:     Assessment/Plan: Single intrauterine pregnancy at [redacted]w[redacted]d Preterm premature rupture of membranes Preterm labor History of previous preterm births HIV  with elevated titers Cocaine and THC use yesterday Poor weight gain and fetal AC <10%ile Prior Cesarean Section followed by one VBAC History (personal) of Neural Tube defect Schizophrenia with secondary Parkinsons  Admit to [facility] Suites Magnesium Sulfate to facilitate two doses of Betamethasone Betamethasone series Flagyl for Trich infection GBS prophylaxis Plan TOLAC     Wynelle Bourgeois 06/06/2018, 5:30 PM

## 2018-06-06 NOTE — Progress Notes (Signed)
Patient checked into clinic however she states that her water broke while going to the bathroom. She was placed on stretcher with [facility] She was stable, reported having intermittent contractions this morning. No crowning noticed on visual exam.   Patient does not have an undetectable viral load.  Please check Viral Load and start AZT protocol. Please check UDS Continue on home regimen for HIV disease management - biktarvy plus doravarine  Our RN [facility] to give up dates  Aram Beecham B. Drue Second MD MPH [facility] [phone]

## 2018-06-07 ENCOUNTER — Telehealth: Payer: Self-pay | Admitting: Pharmacist

## 2018-06-07 LAB — RAPID URINE DRUG SCREEN, HOSP PERFORMED
AMPHETAMINES: NOT DETECTED
BENZODIAZEPINES: NOT DETECTED
Barbiturates: NOT DETECTED
Cocaine: POSITIVE — AB
OPIATES: NOT DETECTED
TETRAHYDROCANNABINOL: NOT DETECTED

## 2018-06-07 LAB — CULTURE, BETA STREP (GROUP B ONLY)

## 2018-06-07 LAB — HIV-1 RNA QUANT-NO REFLEX-BLD
HIV 1 RNA Quant: <20 copies/mL
LOG10 HIV-1 RNA: UNDETERMINED {Log_copies}/mL

## 2018-06-07 LAB — RPR: RPR: NONREACTIVE

## 2018-06-07 MED ORDER — FENTANYL CITRATE (PF) 100 MCG/2ML IJ SOLN
100.0000 ug | INTRAMUSCULAR | Status: DC | PRN
  Administered 2018-06-08: 100 ug via INTRAVENOUS
  Filled 2018-06-07: qty 2

## 2018-06-07 MED ORDER — ACETAMINOPHEN 325 MG PO TABS: 650.0000 mg | ORAL_TABLET | ORAL | Status: DC | PRN

## 2018-06-07 MED ORDER — TERBUTALINE SULFATE 1 MG/ML IJ SOLN: 0.2500 mg | Freq: Once | INTRAMUSCULAR | Status: DC | PRN

## 2018-06-07 MED ORDER — ONDANSETRON HCL 4 MG/2ML IJ SOLN: 4.0000 mg | Freq: Four times a day (QID) | INTRAMUSCULAR | Status: DC | PRN

## 2018-06-07 MED ORDER — OXYTOCIN BOLUS FROM INFUSION: 500.0000 mL | Freq: Once | INTRAVENOUS | Status: DC

## 2018-06-07 MED ORDER — DOCUSATE SODIUM 100 MG PO CAPS
100.0000 mg | ORAL_CAPSULE | Freq: Every day | ORAL | Status: DC
  Filled 2018-06-07: qty 1

## 2018-06-07 MED ORDER — LACTATED RINGERS IV SOLN
INTRAVENOUS | Status: DC
  Administered 2018-06-08: 01:00:00 via INTRAVENOUS

## 2018-06-07 MED ORDER — OXYTOCIN 40 UNITS IN LACTATED RINGERS INFUSION - SIMPLE MED: 2.5000 [IU]/h | INTRAVENOUS | Status: DC

## 2018-06-07 MED ORDER — SOD CITRATE-CITRIC ACID 500-334 MG/5ML PO SOLN: 30.0000 mL | ORAL | Status: DC | PRN

## 2018-06-07 MED ORDER — OXYTOCIN 40 UNITS IN LACTATED RINGERS INFUSION - SIMPLE MED
INTRAVENOUS | Status: AC
  Filled 2018-06-07: qty 1000

## 2018-06-07 MED ORDER — RITONAVIR 100 MG PO TABS
100.0000 mg | ORAL_TABLET | Freq: Every day | ORAL | Status: DC
  Administered 2018-06-07 – 2018-06-09 (×3): 100 mg via ORAL
  Filled 2018-06-07 (×5): qty 1

## 2018-06-07 MED ORDER — SERTRALINE HCL 50 MG PO TABS
50.0000 mg | ORAL_TABLET | Freq: Every day | ORAL | Status: DC
  Administered 2018-06-07 – 2018-06-10 (×3): 50 mg via ORAL
  Filled 2018-06-07 (×5): qty 1

## 2018-06-07 MED ORDER — PENICILLIN G 3 MILLION UNITS IVPB - SIMPLE MED
3.0000 10*6.[IU] | INTRAVENOUS | Status: DC
  Administered 2018-06-08 (×2): 3 10*6.[IU] via INTRAVENOUS
  Filled 2018-06-07 (×4): qty 100

## 2018-06-07 MED ORDER — LACTATED RINGERS IV SOLN
INTRAVENOUS | Status: DC
  Administered 2018-06-07: 14:00:00 via INTRAVENOUS

## 2018-06-07 MED ORDER — CALCIUM CARBONATE ANTACID 500 MG PO CHEW: 2.0000 | CHEWABLE_TABLET | ORAL | Status: DC | PRN

## 2018-06-07 MED ORDER — LIDOCAINE HCL (PF) 1 % IJ SOLN
30.0000 mL | INTRAMUSCULAR | Status: DC | PRN
  Filled 2018-06-07: qty 30

## 2018-06-07 MED ORDER — ATAZANAVIR SULFATE 150 MG PO CAPS
300.0000 mg | ORAL_CAPSULE | Freq: Every day | ORAL | Status: DC
  Administered 2018-06-07 – 2018-06-08 (×2): 300 mg via ORAL
  Filled 2018-06-07 (×5): qty 2

## 2018-06-07 MED ORDER — OXYTOCIN 40 UNITS IN LACTATED RINGERS INFUSION - SIMPLE MED
1.0000 m[IU]/min | INTRAVENOUS | Status: DC
  Administered 2018-06-07: 2 m[IU]/min via INTRAVENOUS

## 2018-06-07 MED ORDER — FLEET ENEMA 7-19 GM/118ML RE ENEM: 1.0000 | ENEMA | RECTAL | Status: DC | PRN

## 2018-06-07 MED ORDER — LACTATED RINGERS IV SOLN: 500.0000 mL | INTRAVENOUS | Status: DC | PRN

## 2018-06-07 MED ORDER — PRENATAL MULTIVITAMIN CH
1.0000 | ORAL_TABLET | Freq: Every day | ORAL | Status: DC
  Administered 2018-06-07: 1 via ORAL
  Filled 2018-06-07: qty 1

## 2018-06-07 MED ORDER — SODIUM CHLORIDE 0.9 % IV SOLN
5.0000 10*6.[IU] | Freq: Once | INTRAVENOUS | Status: AC
  Administered 2018-06-07: 5 10*6.[IU] via INTRAVENOUS
  Filled 2018-06-07: qty 5

## 2018-06-07 NOTE — Progress Notes (Signed)
Spoke w/ patient about her PTA medications. Reports taking Zoloft 50mg  and Latuda but states she has not been taking Latuda because it gives her very bad tremors. I asked if she would take it if we ordered it and she said no. Spoke with Dr. Marlis Edelson and received new order for Zoloft 50 mg at this time.  Irving Burton Abrea Henle RN

## 2018-06-07 NOTE — Progress Notes (Signed)
OB/GYN Faculty Practice: Labor Progress Note  Subjective: Strip note. Discussed plan of care with RN. Reordered home Zoloft. Patient is eating meal prior to starting pitocin.   Objective: BP 111/65   Pulse 78   Temp 97.9 F (36.6 C) (Oral)   Resp 18   LMP  (LMP Unknown)   SpO2 100%  Gen: strip note Dilation: 4 Effacement (%): 70 Cervical Position: Posterior Station: -1 Presentation: Vertex Exam by:: Enis Slipper, RN  Assessment and Plan: 32 y.o. Z6X0960 [redacted]w[redacted]d here for PPROM which occurred 06/06/18 in the afternoon. Pregnancy high-risk and complicated by history of C/S with one successful VBAC, HIV, poor weight gain, cocaine use, trichomonas, schizophrenia, secondary Parkinson's, history of neural tube defect, and limited PNC.    PPROM  History of C/S, VBAC: 06/06/18 afternoon. Discussed plan for induction with MFM, awaiting steroid maturity this evening but will now start induction with pitocin.  -- continue to titrate pitocin per protocol  -- pain control: undecided -- TOLAC consent signed 05/13/18  -- s/p Mg++ tocolysis, latency antibiotics  -- PPH Risk: low  Fetal Well-Being: EFW 1531 (12%) at 32w0. Cephalic by prior checks.  -- Category I -  Continuous  -- GBS positive (PCN)  -- discussed care with NICU -- s/p BMZ x 2   HIV - Followed by ID.  Last viral load 252  9/19.  -- repeat viral load on process (will not result until 10/17-10/19)  -- appreciate ID assistance - since pifeltro not available -- continue AZT   Taiwo Fish S. Earlene Plater, DO OB/GYN Fellow, Faculty Practice  10:54 PM

## 2018-06-07 NOTE — Progress Notes (Signed)
Patient requesting to eat one last meal before her induction begins. Consulted with Dr. Marlis Edelson and she is okay if patient eats before beginning pitocin induction. Order modified. Will begin oxytocin induction after patient eats.  Irving Burton Mattie Nordell RN

## 2018-06-07 NOTE — Progress Notes (Signed)
LABOR STRIP PROGRESS NOTE  Vada Hodsdon is a 32 y.o. Z6X0960 at [redacted]w[redacted]d  admitted for IOL 2/2 PROM  Objective: BP 113/66 (BP Location: Right Arm)   Pulse 83   Temp 97.9 F (36.6 C) (Oral)   Resp 16   LMP  (LMP Unknown)   SpO2 100%  or  Vitals:   06/06/18 2300 06/06/18 2330 06/07/18 0000 06/07/18 0030  BP: 113/72 115/70 117/74 113/66  Pulse: 73 79 84 83  Resp: 14 15 15 16   Temp:   97.9 F (36.6 C)   TempSrc:   Oral   SpO2: 100% 99% 99% 100%    Dilation: 4 Effacement (%): 80 Station: -1 Presentation: Vertex(by BSUS) Exam by:: Polos RN FHT: baseline rate 125, moderate varibility, +acel, -decel Toco: irregular   Labs: Lab Results  Component Value Date   WBC 5.4 06/06/2018   HGB 10.7 (L) 06/06/2018   HCT 32.8 (L) 06/06/2018   MCV 88.6 06/06/2018   PLT 117 (L) 06/06/2018    Patient Active Problem List   Diagnosis Date Noted  . Preterm premature rupture of membranes 06/06/2018  . Pregnancy affected by fetal growth restriction 04/28/2018  . Insufficient prenatal care 03-May-2018  . Neonatal death 04-06-18  . Insufficient weight gain during pregnancy 03/02/2018  . 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 HIV affecting pregnancy, antepartum 01/19/2018  . History of preterm delivery, currently pregnant 01/19/2018  . History of gestational diabetes mellitus (GDM) in prior pregnancy, currently pregnant 01/19/2018  . Pre-diabetes 01/13/2018  . Chlamydia infection affecting pregnancy 01/10/2018  . Trichomonal vaginitis during pregnancy 01/10/2018  . Protein-calorie malnutrition, severe (HCC) 11/24/2017  . Crack cocaine use   . Bipolar disorder (HCC)   . History of VBAC 08/31/2016  . Supervision of high risk pregnancy, antepartum 08/06/2016  . 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 Tardive dyskinesia 06/13/2014  . 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 Schizophrenia (HCC) 10/18/2013  . HIV (human immunodeficiency virus infection) (HCC) 07/27/2011    Assessment  / Plan: 32 y.o. [id number] at [redacted]w[redacted]d here for IOL 2/2 PROM  Labor: per consultation with DR. Perry Mount, MFM, will plan to start augmentation only after 2nd dose of BMZ (due at 2027 06/07/16) Fetal Wellbeing:  Cat 1 I/D: s/p Flagyl for trichomonas infection Pain Control:  Per patient preference  Anticipated MOD:  SVD  Oralia Manis, DO PGY-2 06/07/2018, 1:49 AM

## 2018-06-07 NOTE — Progress Notes (Signed)
OB/GYN Faculty Practice: Labor Progress Note  Subjective: Doing well, eating fruit loops. Checked by RN and cervical exam unchanged, comfortable. Is going to try to have step-father get HIV medication.   Objective: BP 108/77   Pulse 74   Temp 97.8 F (36.6 C) (Oral)   Resp 20   LMP  (LMP Unknown)   SpO2 100%  Gen: well-appearing, thin, sitting up in bed eating cereal  Dilation: 4 Effacement (%): 70 Cervical Position: Posterior Station: -1 Presentation: Vertex Exam by:: Enis Slipper, RN  Assessment and Plan: 32 y.o. [id number] [redacted]w[redacted]d here for PPROM which occurred 06/06/18 in the afternoon. Pregnancy high-risk and complicated by history of C/S with one successful VBAC, HIV, poor weight gain, cocaine use, trichomonas, schizophrenia, secondary Parkinson's, history of neural tube defect, and limited PNC.    PPROM:  06/06/18 afternoon. Given 34w6, not contracting, IUGR, we will wait to start induction/augmentation until steroid mature this evening. Will transfer to antepartum and bring back to L&D this evening after 2nd dose of BMZ.  -- PPH Risk: low  Fetal Well-Being: EFW 1531 (12%) at 32w0. Cephalic by prior checks.  -- Category I -  Continuous  -- GBS negative -- will call to make NICU aware this evening once starting augmentation   HIV - Followed by ID.  Last viral load 252  9/19.  -- will call ID today to determine alternative medication since pifeltro not available  -- repeat RNA viral load pending   Enrico Eaddy S. Earlene Plater, DO OB/GYN Fellow, Faculty Practice  11:03 AM

## 2018-06-07 NOTE — Progress Notes (Signed)
LABOR STRIP PROGRESS NOTE  Tamara Briggs is a 32 y.o. [id number] at [redacted]w[redacted]d  admitted for IOL 2/2 PROM  Objective: BP 111/76 (BP Location: Right Arm)   Pulse 86   Temp 97.7 F (36.5 C) (Oral)   Resp 16   LMP  (LMP Unknown)   SpO2 100%  or  Vitals:   06/07/18 0230 06/07/18 0300 06/07/18 0330 06/07/18 0400  BP: 113/72 103/64 109/77 111/76  Pulse: 85 76 76 86  Resp: 16 15 15 16   Temp:    97.7 F (36.5 C)  TempSrc:    Oral  SpO2: 98% 99% 99% 100%    Dilation: 4 Effacement (%): 80 Station: -1 Presentation: Vertex(by BSUS) Exam by:: Polos RN FHT: baseline rate 12, moderate varibility, -acel, -decel Toco: irregular   Labs: Lab Results  Component Value Date   WBC 5.4 06/06/2018   HGB 10.7 (L) 06/06/2018   HCT 32.8 (L) 06/06/2018   MCV 88.6 06/06/2018   PLT 117 (L) 06/06/2018    Patient Active Problem List   Diagnosis Date Noted  . Preterm premature rupture of membranes 06/06/2018  . Pregnancy affected by fetal growth restriction 04/28/2018  . Insufficient prenatal care May 09, 2018  . Neonatal death 04/12/2018  . Insufficient weight gain during pregnancy 03/02/2018  . 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 HIV affecting pregnancy, antepartum 01/19/2018  . History of preterm delivery, currently pregnant 01/19/2018  . History of gestational diabetes mellitus (GDM) in prior pregnancy, currently pregnant 01/19/2018  . Pre-diabetes 01/13/2018  . Chlamydia infection affecting pregnancy 01/10/2018  . Trichomonal vaginitis during pregnancy 01/10/2018  . Protein-calorie malnutrition, severe (HCC) 11/24/2017  . Crack cocaine use   . Bipolar disorder (HCC)   . History of VBAC 08/31/2016  . Supervision of high risk pregnancy, antepartum 08/06/2016  . 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 Tardive dyskinesia 06/13/2014  . 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 Schizophrenia (HCC) 10/18/2013  . HIV (human immunodeficiency virus infection) (HCC) 07/27/2011    Assessment /  Plan: 32 y.o. F6O1308 at [redacted]w[redacted]d here for IOL 2/2 PROM  Labor: will plan to start augmentation only after 2nd dose of BMZ (due at 2027 06/07/16) Fetal Wellbeing:  Cat 1 Pain Control:  Per patient preference  Anticipated MOD:  SVD  Oralia Manis, DO PGY-2 06/07/2018, 4:52 AM

## 2018-06-07 NOTE — Telephone Encounter (Signed)
error 

## 2018-06-08 ENCOUNTER — Other Ambulatory Visit: Payer: Self-pay

## 2018-06-08 ENCOUNTER — Encounter (HOSPITAL_COMMUNITY): Payer: Self-pay | Admitting: *Deleted

## 2018-06-08 DIAGNOSIS — O4202 Full-term premature rupture of membranes, onset of labor within 24 hours of rupture: Secondary | ICD-10-CM

## 2018-06-08 DIAGNOSIS — O99824 Streptococcus B carrier state complicating childbirth: Secondary | ICD-10-CM

## 2018-06-08 DIAGNOSIS — Z3A35 35 weeks gestation of pregnancy: Secondary | ICD-10-CM

## 2018-06-08 MED ORDER — SIMETHICONE 80 MG PO CHEW: 80.0000 mg | CHEWABLE_TABLET | ORAL | Status: DC | PRN

## 2018-06-08 MED ORDER — BENZOCAINE-MENTHOL 20-0.5 % EX AERO: 1.0000 "application " | INHALATION_SPRAY | CUTANEOUS | Status: DC | PRN

## 2018-06-08 MED ORDER — LACTATED RINGERS AMNIOINFUSION: INTRAVENOUS | Status: DC

## 2018-06-08 MED ORDER — MEASLES, MUMPS & RUBELLA VAC ~~LOC~~ INJ
0.5000 mL | INJECTION | Freq: Once | SUBCUTANEOUS | Status: DC
  Filled 2018-06-08: qty 0.5

## 2018-06-08 MED ORDER — COCONUT OIL OIL: 1.0000 "application " | TOPICAL_OIL | Status: DC | PRN

## 2018-06-08 MED ORDER — ONDANSETRON HCL 4 MG PO TABS: 4.0000 mg | ORAL_TABLET | ORAL | Status: DC | PRN

## 2018-06-08 MED ORDER — IBUPROFEN 800 MG PO TABS
400.0000 mg | ORAL_TABLET | Freq: Three times a day (TID) | ORAL | Status: DC
  Administered 2018-06-08 – 2018-06-10 (×5): 400 mg via ORAL
  Filled 2018-06-08 (×6): qty 1

## 2018-06-08 MED ORDER — WITCH HAZEL-GLYCERIN EX PADS: 1.0000 "application " | MEDICATED_PAD | CUTANEOUS | Status: DC | PRN

## 2018-06-08 MED ORDER — PRENATAL MULTIVITAMIN CH
1.0000 | ORAL_TABLET | Freq: Every day | ORAL | Status: DC
  Administered 2018-06-08 – 2018-06-09 (×2): 1 via ORAL
  Filled 2018-06-08 (×2): qty 1

## 2018-06-08 MED ORDER — ONDANSETRON HCL 4 MG/2ML IJ SOLN: 4.0000 mg | INTRAMUSCULAR | Status: DC | PRN

## 2018-06-08 MED ORDER — IBUPROFEN 600 MG PO TABS: 600.0000 mg | ORAL_TABLET | Freq: Four times a day (QID) | ORAL | Status: DC

## 2018-06-08 MED ORDER — DIPHENHYDRAMINE HCL 25 MG PO CAPS: 25.0000 mg | ORAL_CAPSULE | Freq: Four times a day (QID) | ORAL | Status: DC | PRN

## 2018-06-08 MED ORDER — ACETAMINOPHEN 325 MG PO TABS
650.0000 mg | ORAL_TABLET | ORAL | Status: DC | PRN
  Administered 2018-06-09 (×2): 650 mg via ORAL
  Filled 2018-06-08 (×2): qty 2

## 2018-06-08 MED ORDER — SENNOSIDES-DOCUSATE SODIUM 8.6-50 MG PO TABS
2.0000 | ORAL_TABLET | ORAL | Status: DC
  Filled 2018-06-08: qty 2

## 2018-06-08 MED ORDER — TETANUS-DIPHTH-ACELL PERTUSSIS 5-2.5-18.5 LF-MCG/0.5 IM SUSP: 0.5000 mL | Freq: Once | INTRAMUSCULAR | Status: DC

## 2018-06-08 MED ORDER — OXYCODONE-ACETAMINOPHEN 5-325 MG PO TABS: 1.0000 | ORAL_TABLET | ORAL | Status: DC | PRN

## 2018-06-08 MED ORDER — DIBUCAINE 1 % RE OINT: 1.0000 "application " | TOPICAL_OINTMENT | RECTAL | Status: DC | PRN

## 2018-06-08 MED ORDER — ZOLPIDEM TARTRATE 5 MG PO TABS: 5.0000 mg | ORAL_TABLET | Freq: Every evening | ORAL | Status: DC | PRN

## 2018-06-08 NOTE — Progress Notes (Signed)
This note also relates to the following rows which could not be included: SpO2 - Cannot attach notes to unvalidated device data  NICU in room

## 2018-06-08 NOTE — Progress Notes (Signed)
OB/GYN Faculty Practice: Labor Progress Note  Subjective: Strip note. Patient sleeping.   Objective: BP (!) 97/57   Pulse 74   Temp 98.7 F (37.1 C) (Oral)   Resp 18   Ht 5' 4.02" (1.626 m)   Wt 43.1 kg   LMP  (LMP Unknown)   SpO2 100%   BMI 16.30 kg/m  Gen: strip note Dilation: 4 Effacement (%): 70 Cervical Position: Posterior Station: -1 Presentation: Vertex Exam by:: [name] RN   Assessment and Plan: 32 y.o. Z6X0960 [redacted]w[redacted]d here for PPROM which occurred 06/06/18 in the afternoon. Pregnancy high-risk and complicated by history of C/S with one successful VBAC, HIV, poor weight gain, cocaine use, trichomonas, schizophrenia, secondary Parkinson's, history of neural tube defect, and limited PNC.    PPROM  History of C/S, VBAC: Induction started evening 06/06/18 after plan with MFM to wait for steroid maturity. s/p Mg++ tocolysis, latency antibiotics  -- continue to titrate pitocin per protocol  -- pain control: undecided -- TOLAC consent signed 05/13/18  -- PPH Risk: low  Fetal Well-Being: EFW 1531 (12%) at 32w0. Cephalic by prior checks.  -- Category I -  Continuous  -- GBS positive (PCN)  -- discussed care with NICU -- s/p BMZ x 2   HIV - Followed by ID.  Last viral load 252  9/19.  -- repeat viral load on process (will not result until 10/17-10/19)  -- appreciate ID assistance - since pifeltro not available -- continue AZT   Tamara Briggs S. Tamara Plater, DO OB/GYN Fellow, Faculty Practice  3:14 AM

## 2018-06-08 NOTE — Clinical Social Work Maternal (Signed)
CLINICAL SOCIAL WORK MATERNAL/CHILD NOTE  Patient Details  Name: Tamara Briggs MRN: [id number] Date of Birth: 1985/12/23  Date:  06/08/2018  Clinical Social Worker Initiating Note:  Tamara Briggs Date/Time: Initiated:  06/08/18/      Child's Name:  Tamara Briggs   Biological Parents:  Mother, Father(FOB is Tamara Briggs 06/16/1983)   Need for Interpreter:  None   Reason for Referral:  Behavioral Health Concerns, Newly Diagnosed HIV, Current Substance Use/Substance Use During Pregnancy , Current CPS Involvement   Address:  [place]  [address]    Phone number:  [phone] (home)     Additional phone number: MOB's [facility] case worker is Tamara Briggs [phone].  Household Members/Support Persons (HM/SP):   Household Member/Support Person 1, Household Member/Support Person 2(MOB has 2 older children that she does not have custody of; Per MOB, MOB resides with MOB's stepfather, Tamara Briggs )   HM/SP Name Relationship DOB or Age  HM/SP -[place].(Infant is currently in CPS custody. ) son 10/24/2017  HM/SP -2 Tamara Briggs daughter 04/10/2008  HM/SP -3        HM/SP -4        HM/SP -5        HM/SP -6        HM/SP -7        HM/SP -8          Natural Supports (not living in the home):  Immediate Family, Parent, Radiographer, therapeutic Supports: Case Manager/Social Worker(MOB's case Freight forwarder with [facility] Tamara Briggs [phone]. [phone]) is a Theatre manager for [employer].)   Employment: Unemployed   Type of Work:     Education:      Homebound arranged:    Pensions consultant:  Medicaid, SSI/Disability   Other Resources:  [employer], Physicist, medical    Cultural/Religious Considerations Which May Impact Care:  Per [employer] Sheet, MOB is Non-denominational.  Strengths:  Compliance with medical plan , Psychotropic Medications, Understanding of illness   Psychotropic Medications:  Zoloft      Pediatrician:       Pediatrician List:   [place]      Pediatrician Fax Number:    Risk Factors/Current Problems:  Mental Health Concerns , Substance Use , DHHS Involvement , Transportation    Cognitive State:  Able to Concentrate , Linear Thinking    Mood/Affect:  Calm , Happy , Relaxed , Interested    CSW Assessment: CSW  met with MOB in room 321.  When CSW arrived, MOB and MOB's [facility] case worker were talking. CSW explained CSW's role and MOB gave CSW permission to complete the assessment while MOB's case manager was present. MOB was not a good historian so it was helpful to have MOB's case manager present to answer specific questions.   CSW inquired about MOB's thoughts and feeling regarding NICU admission and MOB expressed, I'm ok."  MOB reported having other children born and having to be admitted to the NICU. MOB expressed feelings of happiness and excitement about being a new mother again.   MOB asked about MOB's  MH hx and MOB openly shared a hx of schizophrenia and bipolar disorder.  MOB reported that MOB is an established patient at [facility] and MOB has been compliant with her medication regiment; MOB Case manager also confirmed information. CSW assessed for safety and  MOB denied SI, HI, and DV. MOB next scheduled appointment with her psychiatrist at [facility] is 07/19/18. CSW provided education regarding the baby blues period vs. perinatal mood disorders, discussed treatment and gave resources for mental health follow up if concerns arise.  CSW recommends self-evaluation during the postpartum time period using the New Mom Checklist from Postpartum Progress and encouraged MOB to contact a medical professional if symptoms are noted at any time. MOB did not present with any acute symptoms.  CSW assessed barriers for MOB visiting with infant in NICU and MOB communicated transportation barriers.  MOB currently has a 31 day bus pass and CSW encouraged MOB to  utilized [facility] for daily transportation to and from the hospital to visit with infant; MOB agreed. MOB also reported not having all essential items need to parent infant.  MOB confirmed that MOB has a car seat but will also need safe sleeping area, diapers, wipes and other essential items. CSW agreed to assist MOB with items as infant approaches discharge.  CSW asked about MOB's SA hx and MOB reported the use of marijuana (last use 06/05/18) and crack/cocaine (last use 06/04/28) throughout MOB's pregnancy.  CSW explained hospital's SA policy and MOB was understanding.  MOB stated," I'm familiar because it's the same thing that happened with my last baby." MOB reported having 1 child currently in [place] custody awaiting adoption and 1 child currently residing with the biological father (per MOB, CPS was not involved).  MOB is aware that CSW is monitoring infant's UDS and CDS and will provide results to CPS. CSW also made MOB aware that CSW will make a report to CPS due to MOB's CPS hx; MOB was understanding but tearful. CSW validated and normalized MOB's thoughts and feelings and encouraged MOB to comply with CPS case plan.   CSW will continue to offer MOB resources and supports while infant remains in NICU.   CSW made a report with [place] specialist, Tamara Briggs.   There are barriers to infants discharge to MOB.  CPS will provide CSW with a safety disposition plan prior to infant's discharge.   CSW Plan/Description:  Psychosocial Support and Ongoing Assessment of Needs, Sudden Infant Death Syndrome (SIDS) Education, Perinatal Mood and Anxiety Disorder (PMADs) Education, Other Patient/Family Education, [place], Other Information/Referral to [employer], Child Protective Service Report , CSW Awaiting CPS Disposition Plan   Tamara Briggs, MSW, LCSW Clinical Social Work [phone]   Tamara Briggs, [place] 06/08/2018, 2:11  PM

## 2018-06-08 NOTE — Progress Notes (Signed)
OB/GYN Faculty Practice: Labor Progress Note  Subjective: Strip note. Discussed plan of care with RN. Difficult to trace contractions. Has changed to 5, some bloody show.   Objective: BP (!) 126/51   Pulse 73   Temp 98.5 F (36.9 C) (Oral)   Resp 18   Ht 5' 4.02" (1.626 m)   Wt 43.1 kg   LMP  (LMP Unknown)   SpO2 100%   BMI 16.30 kg/m  Gen: strip note Dilation: 5 Effacement (%): 100 Cervical Position: Middle Station: -1 Presentation: Vertex Exam by:: Tamara Burton Rothermel RN   Assessment and Plan: 32 y.o. Z6X0960 [redacted]w[redacted]d here for PPROM which occurred 06/06/18 in the afternoon. Pregnancy high-risk and complicated by history of C/S with one successful VBAC, HIV, poor weight gain, cocaine use, trichomonas, schizophrenia, secondary Parkinson's, history of neural tube defect, and limited PNC.    PPROM  History of C/S, VBAC: Induction started evening 06/06/18 after plan with MFM to wait for steroid maturity. s/p Mg++ tocolysis, latency antibiotics. Started induction/augmentation overnight 10/15-16 with pitocin.  -- continue to titrate pitocin per protocol  -- pain control: undecided -- TOLAC consent signed 05/13/18  -- PPH Risk: low  Fetal Well-Being: EFW 1531 (12%) at 32w0. Cephalic by prior checks.  -- Category I -  Continuous - periods of FHR <110, but coming off of variables which spontaneously resolve, CTM  -- GBS positive (PCN)  -- discussed care with NICU -- s/p BMZ x 2   HIV: Followed by ID.  Last viral load 252  9/19.  -- repeat viral load on process (will not result until 10/17-10/19)  -- appreciate ID assistance - since pifeltro not available -- continue AZT   Eain Mullendore S. Earlene Plater, DO OB/GYN Fellow, Faculty Practice  6:22 AM

## 2018-06-09 ENCOUNTER — Encounter (HOSPITAL_COMMUNITY): Payer: Self-pay

## 2018-06-09 ENCOUNTER — Other Ambulatory Visit (HOSPITAL_COMMUNITY): Payer: Medicaid Other

## 2018-06-09 ENCOUNTER — Ambulatory Visit (HOSPITAL_COMMUNITY)
Admission: RE | Admit: 2018-06-09 | Discharge: 2018-06-09 | Disposition: A | Discharge status: Home / Self Care | Payer: Medicaid Other | Source: Ambulatory Visit | Attending: Obstetrics & Gynecology | Admitting: Obstetrics & Gynecology

## 2018-06-09 NOTE — Progress Notes (Signed)
Post Partum Day 1 Subjective: Patient is doing well without complaints. She is ambulating, voiding and eating well without complaints  Objective: Blood pressure (!) 130/11, pulse 92, temperature 98.8 F (37.1 C), temperature source Oral, resp. rate 18, height 5' 4.02" (1.626 m), weight 43.1 kg, SpO2 97 %, not currently breastfeeding.  Physical Exam:  General: alert, cooperative and no distress Lochia: appropriate Uterine Fundus: firm DVT Evaluation: No evidence of DVT seen on physical exam. No cords or calf tenderness.  Recent Labs    06/06/18 1805  HGB 10.7*  HCT 32.8*    Assessment/Plan: Plan for discharge tomorrow  Continue routine postpartum care   LOS: 3 days   Eron Goble 06/09/2018, 10:37 AM

## 2018-06-09 NOTE — Progress Notes (Signed)
CSW spoke with MOB's CPS worker, Natasha Mead [phone], regarding disposition plans. Ms. Cedric Fishman plans to meet with MOB this afternoon 10/17 to determine safety plan. CSW will continue to follow for any updates.   Stacy Gardner, LCSW Clinical Social Worker  [facility]  [phone]

## 2018-06-10 ENCOUNTER — Encounter: Payer: Medicaid Other | Admitting: Obstetrics & Gynecology

## 2018-06-10 NOTE — Progress Notes (Signed)
CSW spoke with on call CPS worker who stated safety plan for baby has yet to be determined in the event baby is ready for discharge over the weekend. Once discharge date is determined CPS will solidify a safety plan for baby.   Please contact CSW for any questions concerns babies disposition.   Gillian Kluever, LCSW Clinical Social Worker  [facility]  [phone]  

## 2018-06-10 NOTE — Discharge Instructions (Signed)
Postpartum Care After Vaginal Delivery °The period of time right after you deliver your newborn is called the postpartum period. °What kind of medical care will I receive? °· You may continue to receive fluids and medicines through an IV tube inserted into one of your veins. °· If an incision was made near your vagina (episiotomy) or if you had some vaginal tearing during delivery, cold compresses may be placed on your episiotomy or your tear. This helps to reduce pain and swelling. °· You may be given a squirt bottle to use when you go to the bathroom. You may use this until you are comfortable wiping as usual. To use the squirt bottle, follow these steps: °? Before you urinate, fill the squirt bottle with warm water. Do not use hot water. °? After you urinate, while you are sitting on the toilet, use the squirt bottle to rinse the area around your urethra and vaginal opening. This rinses away any urine and blood. °? You may do this instead of wiping. As you start healing, you may use the squirt bottle before wiping yourself. Make sure to wipe gently. °? Fill the squirt bottle with clean water every time you use the bathroom. °· You will be given sanitary pads to wear. °How can I expect to feel? °· You may not feel the need to urinate for several hours after delivery. °· You will have some soreness and pain in your abdomen and vagina. °· If you are breastfeeding, you may have uterine contractions every time you breastfeed for up to several weeks postpartum. Uterine contractions help your uterus return to its normal size. °· It is normal to have vaginal bleeding (lochia) after delivery. The amount and appearance of lochia is often similar to a menstrual period in the first week after delivery. It will gradually decrease over the next few weeks to a dry, yellow-brown discharge. For most women, lochia stops completely by 6-8 weeks after delivery. Vaginal bleeding can vary from woman to woman. °· Within the first few  days after delivery, you may have breast engorgement. This is when your breasts feel heavy, full, and uncomfortable. Your breasts may also throb and feel hard, tightly stretched, warm, and tender. After this occurs, you may have milk leaking from your breasts. Your health care provider can help you relieve discomfort due to breast engorgement. Breast engorgement should go away within a few days. °· You may feel more sad or worried than normal due to hormonal changes after delivery. These feelings should not last more than a few days. If these feelings do not go away after several days, speak with your health care provider. °How should I care for myself? °· Tell your health care provider if you have pain or discomfort. °· Drink enough water to keep your urine clear or pale yellow. °· Wash your hands thoroughly with soap and water for at least 20 seconds after changing your sanitary pads, after using the toilet, and before holding or feeding your baby. °· If you are not breastfeeding, avoid touching your breasts a lot. Doing this can make your breasts produce more milk. °· If you become weak or lightheaded, or you feel like you might faint, ask for help before: °? Getting out of bed. °? Showering. °· Change your sanitary pads frequently. Watch for any changes in your flow, such as a sudden increase in volume, a change in color, the passing of large blood clots. If you pass a blood clot from your vagina, save it   to show to your health care provider. Do not flush blood clots down the toilet without having your health care provider look at them. °· Make sure that all your vaccinations are up to date. This can help protect you and your baby from getting certain diseases. You may need to have immunizations done before you leave the hospital. °· If desired, talk with your health care provider about methods of family planning or birth control (contraception). °How can I start bonding with my baby? °Spending as much time as  possible with your baby is very important. During this time, you and your baby can get to know each other and develop a bond. Having your baby stay with you in your room (rooming in) can give you time to get to know your baby. Rooming in can also help you become comfortable caring for your baby. Breastfeeding can also help you bond with your baby. °How can I plan for returning home with my baby? °· Make sure that you have a car seat installed in your vehicle. °? Your car seat should be checked by a certified car seat installer to make sure that it is installed safely. °? Make sure that your baby fits into the car seat safely. °· Ask your health care provider any questions you have about caring for yourself or your baby. Make sure that you are able to contact your health care provider with any questions after leaving the hospital. °This information is not intended to replace advice given to you by your health care provider. Make sure you discuss any questions you have with your health care provider. °Document Released: 06/07/2007 Document Revised: 01/13/2016 Document Reviewed: 07/15/2015 °Elsevier Interactive Patient Education © 2018 [employer] ° °

## 2018-06-10 NOTE — Discharge Summary (Signed)
OB Discharge Summary     Patient Name: Tamara Briggs DOB: 1986/03/14 MRN: [id number]  Date of admission: 06/06/2018 Delivering MD: Tamera Stands   Date of discharge: 06/10/2018  Admitting diagnosis: CTX Intrauterine pregnancy: [redacted]w[redacted]d     Secondary diagnosis:  Active Problems:   HIV (human immunodeficiency virus infection) (HCC)   History of neural tube defect   History of VBAC   Protein-calorie malnutrition, severe (HCC)   HIV affecting pregnancy, antepartum   Pregnancy affected by fetal growth restriction   Preterm premature rupture of membranes  Discharge diagnosis: Preterm Pregnancy Delivered                                                                                                Hospital course:  Induction of Labor With Vaginal Delivery   32 y.o. yo [id number] at [redacted]w[redacted]d was admitted to the hospital 06/06/2018 for induction of labor.  Indication for induction: PPROM.  Patient had an uncomplicated labor course as follows. She received BMZ prior to delivery: Membrane Rupture Time/Date: 3:30 PM ,06/06/2018   Intrapartum Procedures: Episiotomy: None [1]                                         Lacerations:  None [1]  Patient had delivery of a Viable infant.  Information for the patient's newborn:  Jazel, Nimmons [[id number]]  Delivery Method: VBAC, Spontaneous(Filed from Delivery Summary)   06/08/2018  Details of delivery can be found in separate delivery note.  Patient had a routine postpartum course. Patient is discharged home 06/10/18.  Physical exam  Vitals:   06/09/18 1731 06/09/18 1951 06/10/18 0435 06/10/18 0854  BP: 137/78 117/73 124/74 106/88  Pulse: 64 67 62 73  Resp: 18 18 20 20   Temp: 98.6 F (37 C) 99.3 F (37.4 C) 98.9 F (37.2 C) 97.8 F (36.6 C)  TempSrc: Oral Oral Oral Oral  SpO2: 100% 100% 100% 97%  Weight:      Height:       General: alert, cooperative and no distress Lochia: appropriate Uterine Fundus: firm DVT Evaluation: No evidence  of DVT seen on physical exam. No cords or calf tenderness. Labs: Lab Results  Component Value Date   WBC 5.4 06/06/2018   HGB 10.7 (L) 06/06/2018   HCT 32.8 (L) 06/06/2018   MCV 88.6 06/06/2018   PLT 117 (L) 06/06/2018   CMP Latest Ref Rng & Units 11/23/2017  Glucose 65 - 99 mg/dL 88  BUN 7 - 25 mg/dL 13  Creatinine 1.30 - 8.65 mg/dL 7.84  Sodium 696 - 295 mmol/L 132(L)  Potassium 3.5 - 5.3 mmol/L 3.9  Chloride 98 - 110 mmol/L 106  CO2 20 - 32 mmol/L 28  Calcium 8.6 - 10.2 mg/dL 9.0  Total Protein 6.1 - 8.1 g/dL 8.0  Total Bilirubin 0.2 - 1.2 mg/dL 0.2  Alkaline Phos 33 - 115 U/L -  AST 10 - 30 U/L 17  ALT 6 - 29 U/L 15    Discharge  instruction: per After Visit Summary and "Baby and Me Booklet".  After visit meds:  Allergies as of 06/10/2018   No Known Allergies     Medication List    STOP taking these medications   hydrocortisone 2.5 % lotion     TAKE these medications   acetaminophen 325 MG tablet Commonly known as:  TYLENOL Take 2 tablets (650 mg total) by mouth every 4 (four) hours as needed (for pain scale < 4).   azithromycin 250 MG tablet Commonly known as:  ZITHROMAX Take 2 tabs today, followed by 1 tab daily until complete   benztropine 0.5 MG tablet Commonly known as:  COGENTIN Take 0.5 mg by mouth daily as needed for tremors.   bictegravir-emtricitabine-tenofovir AF 50-200-25 MG Tabs tablet Commonly known as:  BIKTARVY Take 1 tablet by mouth daily.   ENSURE Take 237 mLs by mouth 2 (two) times daily between meals.   IBU 800 MG tablet Generic drug:  ibuprofen Take 800 mg by mouth 2 (two) times daily as needed.   LATUDA 40 MG Tabs tablet Generic drug:  lurasidone TAKE one tablet BY MOUTH EVERY EVENING with meals What changed:  Another medication with the same name was removed. Continue taking this medication, and follow the directions you see here.   methocarbamol 500 MG tablet Commonly known as:  ROBAXIN Take 500 mg by mouth 2 (two) times  daily as needed for muscle spasms.   PIFELTRO 100 MG Tabs tablet Generic drug:  doravirine Take 1 tablet (100 mg total) by mouth at bedtime.   PREPLUS 27-1 MG Tabs Take 1 tablet by mouth daily.   sertraline 50 MG tablet Commonly known as:  ZOLOFT Take 1 tablet (50 mg total) by mouth daily.       Diet: routine diet  Activity: Advance as tolerated. Pelvic rest for 6 weeks.   Outpatient follow up: Follow up Appt: Future Appointments  Date Time Provider Department Center  06/14/2018  2:30 PM York Spaniel, MD [facility] None  07/06/2018  3:35 PM Allie Bossier, MD [facility]  07/06/2018  4:15 PM Judyann Munson, MD [facility]   Follow up Visit:No follow-ups on file.  Postpartum contraception: Depo Provera  Newborn Data: Live born female  Birth Weight: 3 lb 13.7 oz (1750 g) APGAR: 7, 8  Newborn Delivery   Birth date/time:  06/08/2018 07:39:00 Delivery type:  VBAC, Spontaneous     Baby Feeding: Bottle Disposition:NICU   06/10/2018 Catalina Antigua, MD

## 2018-06-13 ENCOUNTER — Telehealth: Payer: Self-pay

## 2018-06-13 NOTE — Telephone Encounter (Signed)
Patient called office to update phone number. Was able to update and confirm phone number for patient. Was also able to inform patient of next appointment with our office. Tamara Briggs, [place]

## 2018-06-14 ENCOUNTER — Other Ambulatory Visit: Payer: Self-pay

## 2018-06-14 ENCOUNTER — Ambulatory Visit: Payer: Medicaid Other | Admitting: Neurology

## 2018-06-14 ENCOUNTER — Encounter: Payer: Self-pay | Admitting: Neurology

## 2018-06-14 VITALS — BP 108/74 | HR 123 | Resp 18 | Ht 64.02 in | Wt 96.0 lb

## 2018-06-14 DIAGNOSIS — G2401 Drug induced subacute dyskinesia: Secondary | ICD-10-CM | POA: Diagnosis not present

## 2018-06-14 NOTE — Progress Notes (Signed)
Reason for visit: Tardive dyskinesia  Tamara Briggs is an 32 y.o. female  History of present illness:  Tamara Briggs is a 32 year old right-handed black female with a history of HIV infection, bipolar disorder, and a history of schizophrenia.  The patient is followed through [facility] psychiatry.  In the past she has had severe problems with choreoathetotic movements and tardive dyskinesia, these have improved some, she still has some mild gait instability but she has not had any recent falls.  She delivered a child on 06 June 2018, the child was 6 weeks premature.  The patient has an recovering from this.  She denies any headaches or dizziness or visual changes, she denies issues controlling the bowels or the bladder.  She returns for an evaluation.  Past Medical History:  Diagnosis Date  . Anxiety   . Bipolar disorder (HCC)   . Crack cocaine use   . Depression   . h/o crack Cocaine abuse 03/15/2013   UDS positive cocaine  . 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  AFP increased and sent to genetics.  Amnio declined.  [place] nml.   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 Infection   . Otitis media of left ear 03/15/2013  . Schizophrenia (HCC)   . 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 Tardive dyskinesia 06/13/2014  . Tobacco abuse 03/15/2013    Past Surgical History:  Procedure Laterality Date  . CESAREAN SECTION N/A 12/24/2013   Procedure: CESAREAN SECTION;  Surgeon: Lesly Dukes, MD;  Location: [facility];  Service: Obstetrics;  Laterality: N/A;  . COLPOSCOPY  2009  . DENTAL RESTORATION/EXTRACTION WITH X-RAY      Family History  Problem Relation Age of Onset  . Diabetes Mother   . High blood pressure Mother   . Cancer Father     Social history:  reports that she has been smoking cigarettes. She has a 0.30 pack-year smoking history. She has never used smokeless tobacco. She reports that she has current or past drug history.  Drugs: Cocaine and Marijuana. Frequency: 1.00 time per week. She reports that she does not drink alcohol.   No Known Allergies  Medications:  Prior to Admission medications   Medication Sig Start Date End Date Taking? Authorizing Provider  azithromycin (ZITHROMAX) 250 MG tablet Take 2 tabs today, followed by 1 tab daily until complete 05/02/18  Yes Judyann Munson, MD  benztropine (COGENTIN) 0.5 MG tablet Take 0.5 mg by mouth daily as needed for tremors.  02/04/18  Yes [provider]  bictegravir-emtricitabine-tenofovir AF (BIKTARVY) 50-200-25 MG TABS tablet Take 1 tablet by mouth daily. 05/11/18  Yes Judyann Munson, MD  ENSURE (ENSURE) Take 237 mLs by mouth 2 (two) times daily between meals. 03/08/18  Yes Judyann Munson, MD  IBU 800 MG tablet Take 800 mg by mouth 2 (two) times daily as needed. 10/05/16  Yes [provider]  LATUDA 40 MG TABS tablet TAKE one tablet BY MOUTH EVERY EVENING with meals 02/04/18  Yes [provider]  PIFELTRO 100 MG TABS tablet Take 1 tablet (100 mg total) by mouth at bedtime. 12/02/17  Yes Judyann Munson, MD  Prenatal Vit-Fe Fumarate-FA (PREPLUS) 27-1 MG TABS Take 1 tablet by mouth daily. 01/05/18  Yes Stamford Bing, MD  sertraline (ZOLOFT) 50 MG tablet Take 1 tablet (50 mg total) by mouth daily. 08/06/14  Yes Judyann Munson, MD  acetaminophen (TYLENOL) 325 MG tablet Take  2 tablets (650 mg total) by mouth every 4 (four) hours as needed (for pain scale < 4). Patient not taking: Reported on 06/14/2018 10/25/16   Adair Laundry, MD  methocarbamol (ROBAXIN) 500 MG tablet Take 500 mg by mouth 2 (two) times daily as needed for muscle spasms.  04/21/17   [provider]    ROS:  Out of a complete 14 system review of symptoms, the patient complains only of the following symptoms, and all other reviewed systems are negative.  Decreased activity, weight loss Ear pain Blurred vision Cough, chest tightness Excessive  thirst Abdominal pain, constipation Restless legs, frequent waking, daytime sleepiness Frequent infections Urinary urgency Joint pain, back pain, aching muscles, muscle cramps, walking difficulty Dizziness, headache, weakness Agitation, depression  Blood pressure 108/74, pulse (!) 123, resp. rate 18, height 5' 4.02" (1.626 m), weight 96 lb (43.5 kg), not currently breastfeeding.  Physical Exam  General: The patient is alert and cooperative at the time of the examination.  Skin: No significant peripheral edema is noted.   Neurologic Exam  Mental status: The patient is alert and oriented x 3 at the time of the examination. The patient has apparent normal recent and remote memory, with an apparently normal attention span and concentration ability.   Cranial nerves: Facial symmetry is present. Speech is normal, no aphasia or dysarthria is noted. Extraocular movements are full. Visual fields are full.  Motor: The patient has good strength in all 4 extremities.  Sensory examination: Soft touch sensation is symmetric on the face, arms, and legs.  Coordination: The patient has good finger-nose-finger and heel-to-shin bilaterally.  Mild choreoathetoid movements of the arms are noted.  Gait and station: The patient has a slightly wide-based, unsteady gait.  Tandem gait is slightly unsteady.  Romberg is negative.  Reflexes: Deep tendon reflexes are symmetric.   Assessment/Plan:  1.  Choreoathetosis, drug-induced  2.  Schizophrenia  The patient seems to be fairly stable over time, she is not getting any medications through this office, we will have her follow-up here on an as-needed basis.  The patient will contact our office if needed.  Marlan Palau MD 06/14/2018 2:48 PM  [facility] [address] [place], [place] [address]  Phone [phone] Fax [phone]

## 2018-06-17 ENCOUNTER — Encounter: Payer: Medicaid Other | Admitting: Obstetrics & Gynecology

## 2018-07-06 ENCOUNTER — Ambulatory Visit: Payer: Medicaid Other | Admitting: Obstetrics & Gynecology

## 2018-07-06 ENCOUNTER — Ambulatory Visit: Payer: Medicaid Other | Admitting: Internal Medicine

## 2018-07-07 ENCOUNTER — Ambulatory Visit: Payer: Medicaid Other | Admitting: Advanced Practice Midwife

## 2018-07-11 ENCOUNTER — Telehealth: Payer: Self-pay | Admitting: *Deleted

## 2018-07-11 NOTE — Telephone Encounter (Signed)
Contacted Tamara Briggs today and offered assistance. Tamara Briggs declined any assistance at this time. I asked Tamara Briggs to please give me a call if I can assist her in anyway. Tamara Briggs thanked me for the call and stated she would contact me if and when a need arrives.

## 2018-07-14 ENCOUNTER — Other Ambulatory Visit (HOSPITAL_COMMUNITY)
Admission: RE | Admit: 2018-07-14 | Discharge: 2018-07-14 | Disposition: A | Discharge status: Home / Self Care | Payer: Medicaid Other | Source: Ambulatory Visit | Attending: Infectious Diseases | Admitting: Infectious Diseases

## 2018-07-14 ENCOUNTER — Ambulatory Visit (INDEPENDENT_AMBULATORY_CARE_PROVIDER_SITE_OTHER): Payer: Medicaid Other | Admitting: Infectious Diseases

## 2018-07-14 ENCOUNTER — Encounter: Payer: Self-pay | Admitting: Infectious Diseases

## 2018-07-14 VITALS — BP 121/75 | HR 87 | Temp 98.6°F | Wt 93.0 lb

## 2018-07-14 DIAGNOSIS — Z3042 Encounter for surveillance of injectable contraceptive: Secondary | ICD-10-CM | POA: Diagnosis not present

## 2018-07-14 DIAGNOSIS — Z0000 Encounter for general adult medical examination without abnormal findings: Secondary | ICD-10-CM | POA: Diagnosis not present

## 2018-07-14 DIAGNOSIS — Z309 Encounter for contraceptive management, unspecified: Secondary | ICD-10-CM | POA: Insufficient documentation

## 2018-07-14 DIAGNOSIS — B20 Human immunodeficiency virus [HIV] disease: Secondary | ICD-10-CM

## 2018-07-14 DIAGNOSIS — Z21 Asymptomatic human immunodeficiency virus [HIV] infection status: Secondary | ICD-10-CM

## 2018-07-14 DIAGNOSIS — A749 Chlamydial infection, unspecified: Secondary | ICD-10-CM | POA: Diagnosis not present

## 2018-07-14 DIAGNOSIS — Z9889 Other specified postprocedural states: Secondary | ICD-10-CM

## 2018-07-14 DIAGNOSIS — Z3009 Encounter for other general counseling and advice on contraception: Secondary | ICD-10-CM | POA: Diagnosis not present

## 2018-07-14 DIAGNOSIS — O98813 Other maternal infectious and parasitic diseases complicating pregnancy, third trimester: Secondary | ICD-10-CM

## 2018-07-14 DIAGNOSIS — Z113 Encounter for screening for infections with a predominantly sexual mode of transmission: Secondary | ICD-10-CM | POA: Insufficient documentation

## 2018-07-14 DIAGNOSIS — Z308 Encounter for other contraceptive management: Secondary | ICD-10-CM

## 2018-07-14 DIAGNOSIS — Z23 Encounter for immunization: Secondary | ICD-10-CM

## 2018-07-14 LAB — POCT URINE PREGNANCY: Preg Test, Ur: NEGATIVE

## 2018-07-14 MED ORDER — MEDROXYPROGESTERONE ACETATE 150 MG/ML IM SUSP
150.0000 mg | Freq: Once | INTRAMUSCULAR | Status: AC
  Administered 2018-07-14: 150 mg via INTRAMUSCULAR

## 2018-07-14 MED ORDER — HYDROCORTISONE 2.5 % EX LOTN: TOPICAL_LOTION | Freq: Two times a day (BID) | CUTANEOUS | 3 refills | Status: DC

## 2018-07-14 MED ORDER — DORAVIRINE 100 MG PO TABS: 100.0000 mg | ORAL_TABLET | Freq: Every day | ORAL | 11 refills | Status: DC

## 2018-07-14 MED ORDER — BICTEGRAVIR-EMTRICITAB-TENOFOV 50-200-25 MG PO TABS: 1.0000 | ORAL_TABLET | Freq: Every day | ORAL | 11 refills | Status: DC

## 2018-07-14 NOTE — Patient Instructions (Addendum)
Will give you your depo provera injection today but would like to refer [facility] for consideration of the Nexplanon for contraception. I think this will be better for you.   You need to have your pap smear repeated - if you do not get an appointment soon [facility] in the next month please call us back so Judeth CornfieldStephanie can do your pap smear.   Your new prescriptions have been sent to your pharmacy for your Biktarvy, Doravorine and Hydrocortisone.   Please call [facility] @ [phone] to schedule a dental appointment.   Please come back in 3 months to check in with Dr. Drue SecondSnider with labs 2 weeks before if you can.

## 2018-07-14 NOTE — Assessment & Plan Note (Signed)
Hi

## 2018-07-14 NOTE — Assessment & Plan Note (Signed)
Urine pregnancy test negative today. Will give depo injection x 1 and refer for consideration of Nexplanon considering she often has significant delays in getting her injections from historical review. She is open to and excited about this option. Referral to [facility] placed.

## 2018-07-14 NOTE — Progress Notes (Addendum)
Name: Tamara Briggs  DOB: 1986/07/31 MRN: [id number] PCP: Fleet Contras, MD    Patient Active Problem List   Diagnosis Date Noted  . Routine screening for STI (sexually transmitted infection) Jul 28, 2018  . Encounter for contraceptive management 28-Jul-2018  . Healthcare maintenance 07/28/18  . Neonatal death 04/07/18  . 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 History of gestational diabetes mellitus (GDM) in prior pregnancy, currently pregnant 01/19/2018  . Pre-diabetes 01/13/2018  . Protein-calorie malnutrition, severe (HCC) 11/24/2017  . Crack cocaine use   . Bipolar disorder (HCC)   . History of VBAC 08/31/2016  . 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 Tardive dyskinesia 06/13/2014  . History of loop electrical excision procedure (LEEP) 12/25/2013  . 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 Schizophrenia (HCC) 10/18/2013  . Human immunodeficiency virus (HIV) disease (HCC) 07/27/2011     Subjective:  CC:  Tamara Briggs is a 32 y.o. female here today for follow up on HIV disease. No complaints today.   HPI/ROS: Recently delivered baby vaginally @ [redacted]w[redacted]d after admission for PPROM and induction of labor. She unfortunately she was positive for cocaine so baby was taken away. She has recovered nicely from delivery and has resumed sexual activity. Reports her first cycle started today. She is interested in resuming depo provera injections (which she was on during pregnancy) but is open to other options. She has a lot of sexual partners. Personal history of abnormal pap smears  She is very tired, fatigued. Does not have much desire to eat throughout the day. Not sleeping at night much and takes a lot of naps during the day. Has been off her medications for a week now; she switched her pharmacy and did not notifiy them to transfer the prescription over. She was previously taking Biktarvy and Pifeltro once daily with food.   Review of Systems  Constitutional: Negative for chills,  fever, malaise/fatigue and weight loss.  HENT: Negative for sore throat.        No dental problems  Respiratory: Negative for cough and sputum production.   Cardiovascular: Negative for chest pain and leg swelling.  Gastrointestinal: Negative for abdominal pain, diarrhea and vomiting.  Genitourinary: Negative for dysuria and flank pain.  Musculoskeletal: Negative for joint pain, myalgias and neck pain.  Skin: Negative for rash.  Neurological: Negative for dizziness, tingling and headaches.  Psychiatric/Behavioral: Negative for depression and substance abuse. The patient is not nervous/anxious and does not have insomnia.     Past Medical History:  Diagnosis Date  . Anxiety   . Bipolar disorder (HCC)   . Crack cocaine use   . Depression   . h/o crack Cocaine abuse 03/15/2013   UDS positive cocaine  . 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  AFP increased and sent to genetics.  Amnio declined.  [place] nml.   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 Infection   . Otitis media of left ear 03/15/2013  . Schizophrenia (HCC)   . 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 Tardive dyskinesia 06/13/2014  . Tobacco abuse 03/15/2013    Outpatient Medications Prior to Visit  Medication Sig Dispense Refill  . acetaminophen (TYLENOL) 325 MG tablet Take 2 tablets (650 mg total) by mouth every 4 (four) hours as needed (for pain scale < 4). (Patient not taking: Reported on 06/14/2018) 30 tablet 0  . azithromycin (ZITHROMAX) 250 MG tablet Take 2 tabs today, followed by 1  tab daily until complete 6 each 0  . benztropine (COGENTIN) 0.5 MG tablet Take 0.5 mg by mouth daily as needed for tremors.   2  . ENSURE (ENSURE) Take 237 mLs by mouth 2 (two) times daily between meals. 237 mL 11  . IBU 800 MG tablet Take 800 mg by mouth 2 (two) times daily as needed.  5  . LATUDA 40 MG TABS tablet TAKE one tablet BY MOUTH EVERY EVENING with meals  2  . methocarbamol  (ROBAXIN) 500 MG tablet Take 500 mg by mouth 2 (two) times daily as needed for muscle spasms.   5  . sertraline (ZOLOFT) 50 MG tablet Take 1 tablet (50 mg total) by mouth daily. 30 tablet 5  . bictegravir-emtricitabine-tenofovir AF (BIKTARVY) 50-200-25 MG TABS tablet Take 1 tablet by mouth daily. 30 tablet 11  . PIFELTRO 100 MG TABS tablet Take 1 tablet (100 mg total) by mouth at bedtime. 30 tablet 2  . Prenatal Vit-Fe Fumarate-FA (PREPLUS) 27-1 MG TABS Take 1 tablet by mouth daily. 60 tablet 3   No facility-administered medications prior to visit.      No Known Allergies  Social History   Tobacco Use  . Smoking status: Current Every Day Smoker    Packs/day: 0.10    Years: 3.00    Pack years: 0.30    Types: Cigarettes  . Smokeless tobacco: Never Used  . Tobacco comment: cutting back, 1 cigs/day  Substance Use Topics  . Alcohol use: No    Alcohol/week: 0.0 standard drinks  . Drug use: Yes    Frequency: 1.0 times per week    Types: Cocaine, Marijuana    Comment: cocaine 5/11, mj 2 weeks ago    Family History  Problem Relation Age of Onset  . Diabetes Mother   . High blood pressure Mother   . Cancer Father     Social History   Substance and Sexual Activity  Sexual Activity Yes  . Partners: Male  . Birth control/protection: None   Comment: givne condoms     Objective:   Vitals:   07/14/18 1352  BP: 121/75  Pulse: 87  Temp: 98.6 F (37 C)  TempSrc: Oral  Weight: 93 lb (42.2 kg)   Body mass index is 15.95 kg/m.  Physical Exam  Constitutional: She is oriented to person, place, and time. She appears well-developed and well-nourished.  Seated comfortably in chair. Thin appearing.   HENT:  Mouth/Throat: Mucous membranes are normal. No oral lesions. Normal dentition. No dental abscesses. No oropharyngeal exudate.  Cardiovascular: Normal rate, regular rhythm and normal heart sounds.  Pulmonary/Chest: Effort normal and breath sounds normal.  Abdominal: Soft. She  exhibits no distension. There is no tenderness.  Lymphadenopathy:    She has no cervical adenopathy.  Neurological: She is alert and oriented to person, place, and time.  Skin: Skin is warm and dry. No rash noted.  Psychiatric: She has a normal mood and affect. Judgment normal.  In good spirits today and engaged in care discussion  Nursing note and vitals reviewed.   Lab Results Lab Results  Component Value Date   WBC 5.4 06/06/2018   HGB 10.7 (L) 06/06/2018   HCT 32.8 (L) 06/06/2018   MCV 88.6 06/06/2018   PLT 117 (L) 06/06/2018    Lab Results  Component Value Date   CREATININE 0.61 11/23/2017   BUN 13 11/23/2017   NA 132 (L) 11/23/2017   K 3.9 11/23/2017   CL 106 11/23/2017  CO2 28 11/23/2017    Lab Results  Component Value Date   ALT 15 11/23/2017   AST 17 11/23/2017   ALKPHOS 81 04/06/2017   BILITOT 0.2 11/23/2017    Lab Results  Component Value Date   CHOL 127 10/31/2015   HDL 63 10/31/2015   LDLCALC 45 10/31/2015   TRIG 97 10/31/2015   CHOLHDL 2.0 10/31/2015   HIV 1 RNA Quant (copies/mL)  Date Value  06/06/2018 <20  05/02/2018 252 (H)  03/02/2018 <20 DETECTED (A)   CD4 T Cell Abs (/uL)  Date Value  03/02/2018 320 (L)  11/23/2017 660  05/13/2017 540     Assessment & Plan:   Problem List Items Addressed This Visit      Unprioritized   RESOLVED: Chlamydia infection affecting pregnancy    Hi      Relevant Medications   doravirine (PIFELTRO) 100 MG TABS tablet   bictegravir-emtricitabine-tenofovir AF (BIKTARVY) 50-200-25 MG TABS tablet   Encounter for contraceptive management    Urine pregnancy test negative today. Will give depo injection x 1 and refer for consideration of Nexplanon considering she often has significant delays in getting her injections from historical review. She is open to and excited about this option. [facility].       Relevant Orders   Ambulatory referral to Obstetrics / Gynecology    Healthcare maintenance    She will get her second HPV vaccine today and #3 in 12 weeks per CDC catch up guidelines.       History of loop electrical excision procedure (LEEP)    Last pap smear in May 2019 with ASCUS, HPV (-). Should have pap smear repeated in 9749m; due now. [facility]. I asked her to please make an appointment with me if she cannot get an appointment w/in the next 68570m.       Relevant Orders   Ambulatory referral to Obstetrics / Gynecology   Human immunodeficiency virus (HIV) disease (HCC) - Primary    Doing well on her regimen of Biktarvy + Pifeltro. Will send these to her new preferred pharmacy. She called them as well during our visit to set up having them mailed to her. She was virally suppressed as of 54570m ago. She will return in 7670m to see Dr. Drue SecondSnider again for routine care.  [facility].        Relevant Medications   doravirine (PIFELTRO) 100 MG TABS tablet   bictegravir-emtricitabine-tenofovir AF (BIKTARVY) 50-200-25 MG TABS tablet   Other Relevant Orders   HIV-1 RNA quant-no reflex-bld   T-helper cell (CD4)- ([facility] clinic only)   RPR   Routine screening for STI (sexually transmitted infection)    Check urine gonorrhea, chlamydia and trichomonas today. Asymptomatic but at increased risk d/t multiple partners. Condoms provided today.       Relevant Orders   Urine cytology ancillary only    Other Visit Diagnoses    Depo-Provera contraceptive status       Relevant Orders   POCT urine pregnancy (Completed)     Return in about 3 months (around 10/14/2018).   Rexene AlbertsStephanie Sadhana Frater, MSN, NP-C [facility] Pager: [phone] Office: [phone]  07/14/18  2:57 PM

## 2018-07-14 NOTE — Assessment & Plan Note (Signed)
She will get her second HPV vaccine today and #3 in 12 weeks per CDC catch up guidelines.

## 2018-07-14 NOTE — Assessment & Plan Note (Signed)
Last pap smear in May 2019 with ASCUS, HPV (-). Should have pap smear repeated in 6760m; due now. [facility]. I asked her to please make an appointment with me if she cannot get an appointment w/in the next 276m.

## 2018-07-14 NOTE — Assessment & Plan Note (Addendum)
Doing well on her regimen of Biktarvy + Pifeltro. Will send these to her new preferred pharmacy. She called them as well during our visit to set up having them mailed to her. She was virally suppressed as of 6572m ago. She will return in 6864m to see Dr. Drue SecondSnider again for routine care.  D[facility] placed [facility].

## 2018-07-14 NOTE — Assessment & Plan Note (Signed)
Check urine gonorrhea, chlamydia and trichomonas today. Asymptomatic but at increased risk d/t multiple partners. Condoms provided today.

## 2018-07-15 LAB — URINE CYTOLOGY ANCILLARY ONLY
CHLAMYDIA, DNA PROBE: NEGATIVE
Neisseria Gonorrhea: NEGATIVE
Trichomonas: NEGATIVE

## 2018-07-26 ENCOUNTER — Ambulatory Visit: Payer: Medicaid Other | Admitting: Family Medicine

## 2018-08-10 ENCOUNTER — Telehealth: Payer: Self-pay | Admitting: Pharmacist

## 2018-08-10 ENCOUNTER — Other Ambulatory Visit: Payer: Medicaid Other

## 2018-08-10 DIAGNOSIS — B20 Human immunodeficiency virus [HIV] disease: Secondary | ICD-10-CM

## 2018-08-10 NOTE — Telephone Encounter (Signed)
I see patient's significant other for PrEP.  She accompanied him today for his PrEP appointment. Patient states she is doing well and taking her Biktarvy, Doravirine, and JordanLatuda. She is requesting labs today, so will put her on lab schedule to get those drawn.  She sees Dr. Drue SecondSnider in February.

## 2018-08-11 LAB — T-HELPER CELL (CD4) - (RCID CLINIC ONLY)
CD4 T CELL HELPER: 36 % (ref 33–55)
CD4 T Cell Abs: 810 /uL (ref 400–2700)

## 2018-08-12 LAB — HIV-1 RNA QUANT-NO REFLEX-BLD
HIV 1 RNA QUANT: 42 {copies}/mL — AB
HIV-1 RNA Quant, Log: 1.62 Log copies/mL — ABNORMAL HIGH

## 2018-08-12 LAB — RPR: RPR Ser Ql: NONREACTIVE

## 2018-08-15 ENCOUNTER — Ambulatory Visit: Payer: Medicaid Other | Admitting: Family Medicine

## 2018-08-22 DIAGNOSIS — Z0271 Encounter for disability determination: Secondary | ICD-10-CM

## 2018-09-15 ENCOUNTER — Other Ambulatory Visit: Payer: Self-pay | Admitting: *Deleted

## 2018-09-15 DIAGNOSIS — B20 Human immunodeficiency virus [HIV] disease: Secondary | ICD-10-CM

## 2018-09-19 ENCOUNTER — Other Ambulatory Visit: Payer: Medicaid Other

## 2018-09-19 DIAGNOSIS — B20 Human immunodeficiency virus [HIV] disease: Secondary | ICD-10-CM

## 2018-09-20 LAB — T-HELPER CELL (CD4) - (RCID CLINIC ONLY)
CD4 % Helper T Cell: 34 % (ref 33–55)
CD4 T Cell Abs: 310 /uL — ABNORMAL LOW (ref 400–2700)

## 2018-09-21 LAB — COMPLETE METABOLIC PANEL WITH GFR
AG RATIO: 1.1 (calc) (ref 1.0–2.5)
ALBUMIN MSPROF: 4.1 g/dL (ref 3.6–5.1)
ALT: 13 U/L (ref 6–29)
AST: 18 U/L (ref 10–30)
Alkaline phosphatase (APISO): 64 U/L (ref 33–115)
BILIRUBIN TOTAL: 0.2 mg/dL (ref 0.2–1.2)
BUN: 17 mg/dL (ref 7–25)
CO2: 26 mmol/L (ref 20–32)
CREATININE: 0.86 mg/dL (ref 0.50–1.10)
Calcium: 8.7 mg/dL (ref 8.6–10.2)
Chloride: 108 mmol/L (ref 98–110)
GFR, Est African American: 103 mL/min/{1.73_m2} (ref >=60)
GFR, Est Non African American: 89 mL/min/{1.73_m2} (ref >=60)
GLOBULIN: 3.6 g/dL (ref 1.9–3.7)
Glucose, Bld: 82 mg/dL (ref 65–99)
POTASSIUM: 3.4 mmol/L — AB (ref 3.5–5.3)
SODIUM: 142 mmol/L (ref 135–146)
Total Protein: 7.7 g/dL (ref 6.1–8.1)

## 2018-09-21 LAB — CBC WITH DIFFERENTIAL/PLATELET
ABSOLUTE MONOCYTES: 306 {cells}/uL (ref 200–950)
Basophils Absolute: 22 cells/uL (ref 0–200)
Basophils Relative: 0.6 %
EOS ABS: 187 {cells}/uL (ref 15–500)
Eosinophils Relative: 5.2 %
HEMATOCRIT: 36.3 % (ref 35.0–45.0)
HEMOGLOBIN: 12.1 g/dL (ref 11.7–15.5)
LYMPHS ABS: 803 {cells}/uL — AB (ref 850–3900)
MCH: 27.9 pg (ref 27.0–33.0)
MCHC: 33.3 g/dL (ref 32.0–36.0)
MCV: 83.8 fL (ref 80.0–100.0)
MONOS PCT: 8.5 %
MPV: 9.4 fL (ref 7.5–12.5)
NEUTROS ABS: 2282 {cells}/uL (ref 1500–7800)
Neutrophils Relative %: 63.4 %
Platelets: 213 10*3/uL (ref 140–400)
RBC: 4.33 10*6/uL (ref 3.80–5.10)
RDW: 12.9 % (ref 11.0–15.0)
Total Lymphocyte: 22.3 %
WBC: 3.6 10*3/uL — ABNORMAL LOW (ref 3.8–10.8)

## 2018-09-21 LAB — HIV-1 RNA QUANT-NO REFLEX-BLD
HIV 1 RNA Quant: 33 copies/mL — ABNORMAL HIGH
HIV-1 RNA Quant, Log: 1.52 Log copies/mL — ABNORMAL HIGH

## 2018-10-03 ENCOUNTER — Encounter: Payer: Medicaid Other | Admitting: Internal Medicine

## 2018-10-04 ENCOUNTER — Other Ambulatory Visit: Payer: Self-pay | Admitting: Infectious Diseases

## 2018-10-24 ENCOUNTER — Ambulatory Visit: Payer: Medicaid Other | Admitting: Internal Medicine

## 2018-11-23 ENCOUNTER — Telehealth: Payer: Self-pay | Admitting: *Deleted

## 2018-11-23 ENCOUNTER — Ambulatory Visit: Payer: Medicaid Other | Admitting: Infectious Diseases

## 2018-11-23 NOTE — Telephone Encounter (Signed)
RN left message notifying patient of her missed visit. Asked her to contact us to get this back on the schedule. Andree Coss, RN

## 2018-12-16 ENCOUNTER — Telehealth: Payer: Self-pay | Admitting: Infectious Diseases

## 2018-12-16 NOTE — Telephone Encounter (Signed)
COVID-19 Pre-Screening Questions: ° °Do you currently have a fever (>100 °F), chills or unexplained body aches? No  ° °Are you currently experiencing new cough, shortness of breath, sore throat, runny nose? No  °•  °Have you recently travelled outside the state of [place] in the last 14 days? No  °•  °1. Have you been in contact with someone that is currently pending confirmation of Covid19 testing or has been confirmed to have the Covid19 virus?  No  ° °

## 2018-12-19 ENCOUNTER — Ambulatory Visit: Payer: Medicaid Other | Admitting: Infectious Diseases

## 2019-01-17 ENCOUNTER — Telehealth: Payer: Self-pay | Admitting: Infectious Diseases

## 2019-01-17 NOTE — Telephone Encounter (Signed)
COVID-19 Pre-Screening Questions: ° °Do you currently have a fever (>100 °F), chills or unexplained body aches?no  ° °Are you currently experiencing new cough, shortness of breath, sore throat, runny nose? No  °•  °Have you recently travelled outside the state of [place] in the last 14 days? No   °•  °Have you been in contact with someone that is currently pending confirmation of Covid19 testing or has been confirmed to have the Covid19 virus? No  °

## 2019-01-18 ENCOUNTER — Ambulatory Visit: Payer: Medicaid Other | Admitting: Infectious Diseases

## 2019-02-13 ENCOUNTER — Ambulatory Visit: Payer: Medicaid Other | Admitting: Infectious Diseases

## 2019-02-16 ENCOUNTER — Telehealth: Payer: Self-pay | Admitting: Pharmacist

## 2019-02-16 NOTE — Telephone Encounter (Signed)
COVID-19 Pre-Screening Questions: ° °Do you currently have a fever (>100 °F), chills or unexplained body aches? No  ° °Are you currently experiencing new cough, shortness of breath, sore throat, runny nose? No  °•  °Have you recently travelled outside the state of [place] in the last 14 days? No  °•  °1. Have you been in contact with someone that is currently pending confirmation of Covid19 testing or has been confirmed to have the Covid19 virus?  No  ° °

## 2019-02-20 ENCOUNTER — Ambulatory Visit (INDEPENDENT_AMBULATORY_CARE_PROVIDER_SITE_OTHER): Payer: Self-pay | Admitting: Pharmacist

## 2019-02-20 ENCOUNTER — Other Ambulatory Visit: Payer: Self-pay

## 2019-02-20 ENCOUNTER — Other Ambulatory Visit (HOSPITAL_COMMUNITY)
Admission: RE | Admit: 2019-02-20 | Discharge: 2019-02-20 | Disposition: A | Discharge status: Home / Self Care | Payer: Medicaid Other | Source: Ambulatory Visit | Attending: Infectious Diseases | Admitting: Infectious Diseases

## 2019-02-20 DIAGNOSIS — Z0000 Encounter for general adult medical examination without abnormal findings: Secondary | ICD-10-CM | POA: Diagnosis present

## 2019-02-20 DIAGNOSIS — Z23 Encounter for immunization: Secondary | ICD-10-CM

## 2019-02-20 DIAGNOSIS — B20 Human immunodeficiency virus [HIV] disease: Secondary | ICD-10-CM

## 2019-02-20 MED ORDER — HYDROCORTISONE 2.5 % EX LOTN: TOPICAL_LOTION | Freq: Two times a day (BID) | CUTANEOUS | 3 refills | Status: DC

## 2019-02-20 NOTE — Patient Instructions (Signed)
[facility] [phone]

## 2019-02-20 NOTE — Progress Notes (Signed)
HPI: Tamara Briggs is a 33 y.o. female who presents to the [facility] for HIV follow-up after several no shows with the providers.  Patient Active Problem List   Diagnosis Date Noted  . Routine screening for STI (sexually transmitted infection) 2018-07-18  . Encounter for contraceptive management 07-18-18  . Healthcare maintenance 2018/07/18  . Neonatal death 03/28/18  . 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 History of gestational diabetes mellitus (GDM) in prior pregnancy, currently pregnant 01/19/2018  . Pre-diabetes 01/13/2018  . Protein-calorie malnutrition, severe ([place]) 11/24/2017  . Crack cocaine use   . Bipolar disorder ([place])   . History of VBAC 08/31/2016  . Secondary Parkinson disease ([place]) 06/13/2014  . Tardive dyskinesia 06/13/2014  . History of loop electrical excision procedure (LEEP) 12/25/2013  . 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 Schizophrenia ([place]) 10/18/2013  . Human immunodeficiency virus (HIV) disease ([place]) 07/27/2011    Patient's Medications  New Prescriptions   No medications on file  Previous Medications   ACETAMINOPHEN (TYLENOL) 325 MG TABLET    Take 2 tablets (650 mg total) by mouth every 4 (four) hours as needed (for pain scale < 4).   AZITHROMYCIN (ZITHROMAX) 250 MG TABLET    Take 2 tabs today, followed by 1 tab daily until complete   BENZTROPINE (COGENTIN) 0.5 MG TABLET    Take 0.5 mg by mouth daily as needed for tremors.    BICTEGRAVIR-EMTRICITABINE-TENOFOVIR AF (BIKTARVY) 50-200-25 MG TABS TABLET    Take 1 tablet by mouth daily.   DORAVIRINE (PIFELTRO) 100 MG TABS TABLET    Take 1 tablet (100 mg total) by mouth daily.   ENSURE (ENSURE)    Take 237 mLs by mouth 2 (two) times daily between meals.   IBU 800 MG TABLET    Take 800 mg by mouth 2 (two) times daily as needed.   LATUDA 40 MG TABS TABLET    TAKE one tablet BY MOUTH EVERY EVENING with meals   METHOCARBAMOL (ROBAXIN) 500 MG TABLET    Take 500 mg by mouth 2 (two) times daily as  needed for muscle spasms.    SERTRALINE (ZOLOFT) 50 MG TABLET    Take 1 tablet (50 mg total) by mouth daily.  Modified Medications   Modified Medication Previous Medication   HYDROCORTISONE 2.5 % LOTION hydrocortisone 2.5 % lotion      Apply topically 2 (two) times daily.    Apply topically 2 (two) times daily.  Discontinued Medications   No medications on file    Allergies: No Known Allergies  Past Medical History: Past Medical History:  Diagnosis Date  . Anxiety   . Bipolar disorder ([place])   . Crack cocaine use   . Depression   . h/o crack Cocaine abuse 03/15/2013   UDS positive cocaine  . 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  AFP increased and sent to genetics.  Amnio declined.  [place] nml.   Marland Kitchen HIV test positive ([place])   . Infection   . Otitis media of left ear 03/15/2013  . Schizophrenia ([place])   . Secondary Parkinson disease ([place]) 06/13/2014  . Tardive dyskinesia 06/13/2014  . Tobacco abuse 03/15/2013    Social History: Social History   Socioeconomic History  . Marital status: Single    Spouse name: Not on file  . Number of children: 1  . Years of education: 11th  . Highest education level: Not on file  Occupational History  . Not on file  Social Needs  . Financial resource strain: Not on file  . Food insecurity    Worry: Not on file    Inability: Not on file  . Transportation needs    Medical: Not on file    Non-medical: Not on file  Tobacco Use  . Smoking status: Current Every Day Smoker    Packs/day: 0.10    Years: 3.00    Pack years: 0.30    Types: Cigarettes  . Smokeless tobacco: Never Used  . Tobacco comment: cutting back, 1 cigs/day  Substance and Sexual Activity  . Alcohol use: No    Alcohol/week: 0.0 standard drinks  . Drug use: Yes    Frequency: 1.0 times per week    Types: Cocaine, Marijuana    Comment: cocaine 5/11, mj 2 weeks ago  . Sexual activity: Yes    Partners: Male    Birth  control/protection: None    Comment: givne condoms  Lifestyle  . Physical activity    Days per week: Not on file    Minutes per session: Not on file  . Stress: Not on file  Relationships  . Social Musicianconnections    Talks on phone: Not on file    Gets together: Not on file    Attends religious service: Not on file    Active member of club or organization: Not on file    Attends meetings of clubs or organizations: Not on file    Relationship status: Not on file  Other Topics Concern  . Not on file  Social History Narrative   Patient lives at home with her partner Tamara Briggs.   Unemployed.   Education 11 th grade.   Right handed.   Patient does not drink caffeine.    Labs: Lab Results  Component Value Date   HIV1RNAQUANT 33 (H) 09/19/2018   HIV1RNAQUANT 42 (H) 08/10/2018   HIV1RNAQUANT <20 06/06/2018   CD4TABS 310 (L) 09/19/2018   CD4TABS 810 08/10/2018   CD4TABS 320 (L) 03/02/2018    RPR and STI Lab Results  Component Value Date   LABRPR NON-REACTIVE 08/10/2018   LABRPR Non Reactive 06/06/2018   LABRPR Non Reactive 04/28/2018   LABRPR Non Reactive 01/05/2018   LABRPR Non Reactive 01/05/2018    STI Results GC GC CT CT  Latest Ref Rng & Units - NEGATIVE - NEGATIVE  07/14/2018 Negative - Negative -  05/27/2018 Negative - Negative -  04/20/2018 Negative - **POSITIVE**(A) -  01/05/2018 Negative - POSITIVE(A) -  06/30/2016 Negative - Negative -  08/28/2014 NG: Negative - CT: Negative -  07/20/2014 - NEGATIVE - POSITIVE(A)  04/24/2014 - NEGATIVE - POSITIVE(A)  12/14/2013 - NEGATIVE Negative NEGATIVE  09/16/2012 - NEGATIVE - POSITIVE(A)  10/23/2007 - - NEGATIVERTFDELIM(NOTE)  Testing performed using the BD Probetec ET Chlamydia trachomatis and Neisseria gonorrhea amplified DNA assay. -  04/04/2007 - - NEGATIVERTFDELIM(NOTE)  Testing performed using the BD Probetec ET Chlamydia trachomatis and Neisseria gonorrhea amplified DNA assay. -    Hepatitis B Lab Results  Component  Value Date   HEPBSAB POS (A) 07/09/2011   HEPBSAG Negative 01/05/2018   HEPBCAB NEG 07/09/2011   Hepatitis C No results found for: HEPCAB, HCVRNAPCRQN Hepatitis A Lab Results  Component Value Date   HAV NEG 07/09/2011   Lipids: Lab Results  Component Value Date   CHOL 127 10/31/2015   TRIG 97 10/31/2015   HDL 63 10/31/2015   CHOLHDL 2.0 10/31/2015   VLDL  19 10/31/2015   LDLCALC 45 10/31/2015    Current HIV Regimen: Biktarvy + Pifeltro  Assessment: Tamara Briggs is here today to follow-up for her HIV infection.  She has no showed several times due to her Medicaid lapsing and not having transportation.  It is fixed now and she was able to get here today via the [employer] bus. She was last seen by Judeth CornfieldStephanie on 07/14/18.    She tells me that she is doing well. She is taking her Biktarvy and Pifeltro. She is taking her Biktarvy in the AM and her Pifeltro in the PM and missing some doses of her Pifeltro because of this.  I asked her why she was taking them at different times and she says she was told she couldn't take them together. I told her that taking the Biktarvy and Pifeltro together was fine and to start doing that in the AM so that she doesn't miss a dose of one or takes one without the other. She will start doing that now.  Her medication list is up-to-date.   She states that she started her cycle today and is [facility], which I provided. She last smoked cocaine on Friday and tells me that she called [facility] last week and they did not have any beds available.  She will continue to call every 1-2 weeks to check on this.  She states that she is very motivated to start rehab and get clean from cocaine. I provided her with a bag of food today from our pantry.  I will give HPV #3 today and check her HIV labs. I will have her come and see Dr. Drue SecondSnider in 3 months for follow-up.  Plan: - Continue Biktarvy + Pifeltro and take them both together - HIV viral load,  CD4, CMET, CBC, RPR, urine cytology today - HPV #3/3 today - F/u with Dr. Drue SecondSnider 9/17 at 145pm  Tamara Briggs, PharmD, BCIDP, AAHIVP, CPP Infectious Diseases Clinical Pharmacist [facility] 02/20/2019, 3:53 PM

## 2019-02-21 LAB — T-HELPER CELL (CD4) - (RCID CLINIC ONLY)
CD4 % Helper T Cell: 42 % (ref 33–65)
CD4 T Cell Abs: 620 /uL (ref 400–1790)

## 2019-02-21 LAB — URINE CYTOLOGY ANCILLARY ONLY
Chlamydia: NEGATIVE
Neisseria Gonorrhea: NEGATIVE

## 2019-02-28 LAB — COMPREHENSIVE METABOLIC PANEL
AG Ratio: 1.2 (calc) (ref 1.0–2.5)
ALT: 11 U/L (ref 6–29)
AST: 21 U/L (ref 10–30)
Albumin: 4.3 g/dL (ref 3.6–5.1)
Alkaline phosphatase (APISO): 56 U/L (ref 31–125)
BUN: 15 mg/dL (ref 7–25)
CO2: 29 mmol/L (ref 20–32)
Calcium: 8.5 mg/dL — ABNORMAL LOW (ref 8.6–10.2)
Chloride: 106 mmol/L (ref 98–110)
Creat: 0.89 mg/dL (ref 0.50–1.10)
Globulin: 3.5 g/dL (calc) (ref 1.9–3.7)
Glucose, Bld: 56 mg/dL — ABNORMAL LOW (ref 65–99)
Potassium: 3.9 mmol/L (ref 3.5–5.3)
Sodium: 141 mmol/L (ref 135–146)
Total Bilirubin: 0.2 mg/dL (ref 0.2–1.2)
Total Protein: 7.8 g/dL (ref 6.1–8.1)

## 2019-02-28 LAB — CBC
HCT: 39.8 % (ref 35.0–45.0)
Hemoglobin: 13 g/dL (ref 11.7–15.5)
MCH: 27.8 pg (ref 27.0–33.0)
MCHC: 32.7 g/dL (ref 32.0–36.0)
MCV: 85.2 fL (ref 80.0–100.0)
MPV: 9.3 fL (ref 7.5–12.5)
Platelets: 196 10*3/uL (ref 140–400)
RBC: 4.67 10*6/uL (ref 3.80–5.10)
RDW: 13.1 % (ref 11.0–15.0)
WBC: 3.7 10*3/uL — ABNORMAL LOW (ref 3.8–10.8)

## 2019-02-28 LAB — RPR: RPR Ser Ql: NONREACTIVE

## 2019-02-28 LAB — HIV-1 RNA QUANT-NO REFLEX-BLD
HIV 1 RNA Quant: 64 copies/mL — ABNORMAL HIGH
HIV-1 RNA Quant, Log: 1.81 Log copies/mL — ABNORMAL HIGH

## 2019-05-11 ENCOUNTER — Ambulatory Visit: Payer: Medicaid Other | Admitting: Internal Medicine

## 2019-05-17 ENCOUNTER — Ambulatory Visit: Payer: Medicaid Other | Admitting: Internal Medicine

## 2019-05-22 ENCOUNTER — Ambulatory Visit: Payer: Medicaid Other | Admitting: Internal Medicine

## 2019-05-30 ENCOUNTER — Encounter (HOSPITAL_COMMUNITY): Payer: Self-pay | Admitting: Emergency Medicine

## 2019-05-30 ENCOUNTER — Ambulatory Visit (HOSPITAL_COMMUNITY)
Admission: EM | Admit: 2019-05-30 | Discharge: 2019-05-30 | Disposition: A | Discharge status: Home / Self Care | Payer: Medicaid Other | Attending: Family Medicine | Admitting: Family Medicine

## 2019-05-30 ENCOUNTER — Other Ambulatory Visit: Payer: Self-pay

## 2019-05-30 DIAGNOSIS — K0889 Other specified disorders of teeth and supporting structures: Secondary | ICD-10-CM

## 2019-05-30 MED ORDER — PENICILLIN V POTASSIUM 500 MG PO TABS: 500.0000 mg | ORAL_TABLET | Freq: Three times a day (TID) | ORAL | 0 refills | Status: DC

## 2019-05-30 MED ORDER — HYDROCODONE-ACETAMINOPHEN 5-325 MG PO TABS: 1.0000 | ORAL_TABLET | Freq: Four times a day (QID) | ORAL | 0 refills | Status: DC | PRN

## 2019-05-30 NOTE — ED Provider Notes (Signed)
[facility]   [id number] 05/30/19 Arrival Time: 1709  ASSESSMENT & PLAN:  1. Pain, dental     No sign of abscess requiring I&D at this time. Discussed.  Meds ordered this encounter  Medications  . penicillin v potassium (VEETID) 500 MG tablet    Sig: Take 1 tablet (500 mg total) by mouth 3 (three) times daily.    Dispense:  30 tablet    Refill:  0  . HYDROcodone-acetaminophen (NORCO/VICODIN) 5-325 MG tablet    Sig: Take 1 tablet by mouth every 6 (six) hours as needed for moderate pain or severe pain.    Dispense:  8 tablet    Refill:  0    [place] Controlled Substances Registry consulted for this patient. I feel the risk/benefit ratio today is favorable for proceeding with this prescription for a controlled substance. Medication sedation precautions given.  Dental resource written instructions given. She will schedule dental evaluation as soon as possible if not improving over the next 24-48 hours.  Reviewed expectations re: course of current medical issues. Questions answered. Outlined signs and symptoms indicating need for more acute intervention. Patient verbalized understanding. After Visit Summary given.   SUBJECTIVE:  Tamara Briggs is a 33 y.o. female who reports gradual onset of right lower dental pain described as aching/throbbing with some hot/cold sensitivity. Present for 2 days. Fever: absent. Tolerating PO intake but reports pain with chewing. Normal swallowing. She does not see a dentist regularly. No neck swelling or pain. OTC analgesics without relief. No recent illnesses.  ROS: As per HPI. All other systems negative.   OBJECTIVE: Vitals:   05/30/19 1728  BP: 121/90  Pulse: (!) 110  Resp: 18  Temp: 98.6 F (37 C)  TempSrc: Oral  SpO2: 99%    General appearance: alert; no distress but appears to be in pain HENT: normocephalic; atraumatic; dentition: poor; right lower gums without areas of fluctuance, drainage, or bleeding and with tenderness to  palpation; normal jaw movement without difficulty Neck: supple without LAD; FROM; trachea midline CV: slight tachycardia; regular Lungs: normal respirations; unlabored Skin: warm and dry Psychological: alert and cooperative; normal mood and affect  No Known Allergies  Past Medical History:  Diagnosis Date  . Anxiety   . Bipolar disorder (HCC)   . Crack cocaine use   . Depression   . h/o crack Cocaine abuse 03/15/2013   UDS positive cocaine  . 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  AFP increased and sent to genetics.  Amnio declined.  [place] nml.   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 Infection   . Otitis media of left ear 03/15/2013  . Schizophrenia (HCC)   . 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 Tardive dyskinesia 06/13/2014  . Tobacco abuse 03/15/2013   Social History   Socioeconomic History  . Marital status: Single    Spouse name: Not on file  . Number of children: 1  . Years of education: 11th  . Highest education level: Not on file  Occupational History  . Not on file  Social Needs  . Financial resource strain: Not on file  . Food insecurity    Worry: Not on file    Inability: Not on file  . Transportation needs    Medical: Not on file    Non-medical: Not on file  Tobacco Use  . Smoking status: Current Every Day Smoker    Packs/day: 0.10  Years: 3.00    Pack years: 0.30    Types: Cigarettes  . Smokeless tobacco: Never Used  . Tobacco comment: cutting back, 1 cigs/day  Substance and Sexual Activity  . Alcohol use: No    Alcohol/week: 0.0 standard drinks  . Drug use: Yes    Frequency: 1.0 times per week    Types: Cocaine, Marijuana    Comment: cocaine 5/11, mj 2 weeks ago  . Sexual activity: Yes    Partners: Male    Birth control/protection: None    Comment: givne condoms  Lifestyle  . Physical activity    Days per week: Not on file    Minutes per session: Not on file  . Stress: Not on file   Relationships  . Social Herbalist on phone: Not on file    Gets together: Not on file    Attends religious service: Not on file    Active member of club or organization: Not on file    Attends meetings of clubs or organizations: Not on file    Relationship status: Not on file  . Intimate partner violence    Fear of current or ex partner: Not on file    Emotionally abused: Not on file    Physically abused: Not on file    Forced sexual activity: Not on file  Other Topics Concern  . Not on file  Social History Narrative   Patient lives at home with her partner Marya Fossa.   Unemployed.   Education 11 th grade.   Right handed.   Patient does not drink caffeine.   Family History  Problem Relation Age of Onset  . Diabetes Mother   . High blood pressure Mother   . Cancer Father    Past Surgical History:  Procedure Laterality Date  . CESAREAN SECTION N/A 12/24/2013   Procedure: CESAREAN SECTION;  Surgeon: Guss Bunde, MD;  Location: [facility];  Service: Obstetrics;  Laterality: N/A;  . COLPOSCOPY  2009  . DENTAL RESTORATION/EXTRACTION WITH X-RAY       Vanessa Kick, MD 05/30/19 [phone]

## 2019-05-30 NOTE — ED Triage Notes (Signed)
Pt here for dental pain 

## 2019-06-07 ENCOUNTER — Other Ambulatory Visit: Payer: Self-pay | Admitting: Pharmacist

## 2019-06-07 DIAGNOSIS — B20 Human immunodeficiency virus [HIV] disease: Secondary | ICD-10-CM

## 2019-06-20 ENCOUNTER — Ambulatory Visit: Payer: Medicaid Other | Admitting: Pharmacist

## 2019-06-23 ENCOUNTER — Telehealth: Payer: Self-pay | Admitting: Pharmacy Technician

## 2019-06-23 ENCOUNTER — Ambulatory Visit: Payer: Medicaid Other | Admitting: Pharmacist

## 2019-06-23 NOTE — Telephone Encounter (Signed)
[facility] Patient Advocate Encounter    Findings of the benefits investigation conducted this morning via test claims for the patient's upcoming appointment on 10/30 are as follows:   Insurance: NCMED- active Estimated copay amount: $3.00 Prior Authorization: not required  [facility] Patient Advocate will follow up once patient arrives for their appointment.  Bartholomew Crews, CPhT Specialty Pharmacy Patient [facility] Phone: [phone] Fax: [phone] 06/23/2019 9:59 AM

## 2019-08-03 ENCOUNTER — Other Ambulatory Visit: Payer: Self-pay | Admitting: Infectious Diseases

## 2019-08-03 DIAGNOSIS — B20 Human immunodeficiency virus [HIV] disease: Secondary | ICD-10-CM

## 2019-08-10 ENCOUNTER — Encounter (HOSPITAL_COMMUNITY): Payer: Self-pay

## 2019-08-10 ENCOUNTER — Other Ambulatory Visit: Payer: Self-pay

## 2019-08-10 ENCOUNTER — Emergency Department (HOSPITAL_COMMUNITY): Payer: Medicaid Other

## 2019-08-10 ENCOUNTER — Emergency Department (HOSPITAL_COMMUNITY)
Admission: EM | Admit: 2019-08-10 | Discharge: 2019-08-10 | Disposition: A | Discharge status: Home / Self Care | Payer: Medicaid Other | Attending: Emergency Medicine | Admitting: Emergency Medicine

## 2019-08-10 DIAGNOSIS — Z3201 Encounter for pregnancy test, result positive: Secondary | ICD-10-CM

## 2019-08-10 DIAGNOSIS — R112 Nausea with vomiting, unspecified: Secondary | ICD-10-CM

## 2019-08-10 DIAGNOSIS — R109 Unspecified abdominal pain: Secondary | ICD-10-CM

## 2019-08-10 DIAGNOSIS — Z79899 Other long term (current) drug therapy: Secondary | ICD-10-CM | POA: Diagnosis not present

## 2019-08-10 DIAGNOSIS — Z3A00 Weeks of gestation of pregnancy not specified: Secondary | ICD-10-CM | POA: Diagnosis not present

## 2019-08-10 DIAGNOSIS — O219 Vomiting of pregnancy, unspecified: Secondary | ICD-10-CM | POA: Diagnosis present

## 2019-08-10 DIAGNOSIS — O99891 Other specified diseases and conditions complicating pregnancy: Secondary | ICD-10-CM | POA: Diagnosis not present

## 2019-08-10 DIAGNOSIS — O26891 Other specified pregnancy related conditions, first trimester: Secondary | ICD-10-CM

## 2019-08-10 DIAGNOSIS — R102 Pelvic and perineal pain: Secondary | ICD-10-CM | POA: Diagnosis not present

## 2019-08-10 DIAGNOSIS — F17210 Nicotine dependence, cigarettes, uncomplicated: Secondary | ICD-10-CM | POA: Diagnosis not present

## 2019-08-10 LAB — CBC
HCT: 48.4 % — ABNORMAL HIGH (ref 36.0–46.0)
Hemoglobin: 15 g/dL (ref 12.0–15.0)
MCH: 27.4 pg (ref 26.0–34.0)
MCHC: 31 g/dL (ref 30.0–36.0)
MCV: 88.5 fL (ref 80.0–100.0)
Platelets: 227 10*3/uL (ref 150–400)
RBC: 5.47 MIL/uL — ABNORMAL HIGH (ref 3.87–5.11)
RDW: 13.3 % (ref 11.5–15.5)
WBC: 13.7 10*3/uL — ABNORMAL HIGH (ref 4.0–10.5)
nRBC: 0 % (ref 0.0–0.2)

## 2019-08-10 LAB — URINALYSIS, ROUTINE W REFLEX MICROSCOPIC
Bilirubin Urine: NEGATIVE
Glucose, UA: 150 mg/dL — AB
Hgb urine dipstick: NEGATIVE
Ketones, ur: 5 mg/dL — AB
Nitrite: NEGATIVE
Protein, ur: 30 mg/dL — AB
Specific Gravity, Urine: 1.034 — ABNORMAL HIGH (ref 1.005–1.030)
pH: 5 (ref 5.0–8.0)

## 2019-08-10 LAB — COMPREHENSIVE METABOLIC PANEL
ALT: 20 U/L (ref 0–44)
AST: 26 U/L (ref 15–41)
Albumin: 4.9 g/dL (ref 3.5–5.0)
Alkaline Phosphatase: 70 U/L (ref 38–126)
Anion gap: 7 (ref 5–15)
BUN: 19 mg/dL (ref 6–20)
CO2: 25 mmol/L (ref 22–32)
Calcium: 9.3 mg/dL (ref 8.9–10.3)
Chloride: 104 mmol/L (ref 98–111)
Creatinine, Ser: 0.75 mg/dL (ref 0.44–1.00)
GFR calc Af Amer: >60 mL/min (ref >60)
GFR calc non Af Amer: >60 mL/min (ref >60)
Glucose, Bld: 130 mg/dL — ABNORMAL HIGH (ref 70–99)
Potassium: 4.2 mmol/L (ref 3.5–5.1)
Sodium: 136 mmol/L (ref 135–145)
Total Bilirubin: 0.4 mg/dL (ref 0.3–1.2)
Total Protein: 9.4 g/dL — ABNORMAL HIGH (ref 6.5–8.1)

## 2019-08-10 LAB — I-STAT BETA HCG BLOOD, ED (MC, WL, AP ONLY): I-stat hCG, quantitative: 227.7 m[IU]/mL — ABNORMAL HIGH (ref <5)

## 2019-08-10 LAB — WET PREP, GENITAL
Sperm: NONE SEEN
Trich, Wet Prep: NONE SEEN
Yeast Wet Prep HPF POC: NONE SEEN

## 2019-08-10 LAB — LIPASE, BLOOD: Lipase: 27 U/L (ref 11–51)

## 2019-08-10 MED ORDER — PRENATAL COMPLETE 14-0.4 MG PO TABS: 1.0000 | ORAL_TABLET | Freq: Every day | ORAL | 0 refills | Status: DC

## 2019-08-10 MED ORDER — CEPHALEXIN 500 MG PO CAPS: 500.0000 mg | ORAL_CAPSULE | Freq: Two times a day (BID) | ORAL | 0 refills | Status: DC

## 2019-08-10 NOTE — Discharge Instructions (Addendum)
You are leaving [employer] today.  Please understand that we cannot rule out problems or dangerous to your pregnancy.  Begin taking prenatal vitamins.  If you need to take anything for pain over-the-counter, you should only take Tylenol while you are pregnant.  Avoid cigarettes, alcohol, or other drugs while pregnant as well.  Please follow-up with OB/GYN for prenatal care.  Please return the emergency department or [facility] Hospital at any time if you develop any new or worsening symptoms.

## 2019-08-10 NOTE — ED Triage Notes (Signed)
Pt BIB EMS from home. Pt reports eating 2 sandwiches that "smelt weird" and started having abdominal pain around 1am. Pt reports N/V but denies diarrhea.

## 2019-08-10 NOTE — ED Provider Notes (Signed)
[facility]-EMERGENCY DEPT Provider Note   CSN: [id number] Arrival date & time: 08/10/19  1344     History Chief Complaint  Patient presents with  . Abdominal Pain    Tamara Briggs is a 33 y.o.G5P2 female with history of bipolar disorder, schizophrenia, HIV, polysubstance abuse who presents with nausea, vomiting, diarrhea, and abdominal pain.  She reports the diarrhea has been improving.  She is also had some improvement of the nausea and vomiting and was eating a sandwich earlier.  She is having some pain over her bladder.  She denies any abnormal vaginal bleeding or discharge.  She is concerned she could be pregnant.  LMP 06/30/2019.  She denies any fever, chest pain, shortness of breath, urinary symptoms.  HPI     Past Medical History:  Diagnosis Date  . Anxiety   . Bipolar disorder (HCC)   . Crack cocaine use   . Depression   . h/o crack Cocaine abuse 03/15/2013   UDS positive cocaine  . 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  AFP increased and sent to genetics.  Amnio declined.  [place] nml.   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 Infection   . Otitis media of left ear 03/15/2013  . Schizophrenia (HCC)   . 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 Tardive dyskinesia 06/13/2014  . Tobacco abuse 03/15/2013    Patient Active Problem List   Diagnosis Date Noted  . Routine screening for STI (sexually transmitted infection) 07-19-2018  . Encounter for contraceptive management 07/19/2018  . Healthcare maintenance Jul 19, 2018  . Neonatal death Mar 29, 2018  . 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 History of gestational diabetes mellitus (GDM) in prior pregnancy, currently pregnant 01/19/2018  . Pre-diabetes 01/13/2018  . Protein-calorie malnutrition, severe (HCC) 11/24/2017  . Crack cocaine use   . Bipolar disorder (HCC)   . History of VBAC 08/31/2016  . 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 Tardive  dyskinesia 06/13/2014  . History of loop electrical excision procedure (LEEP) 12/25/2013  . 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 Schizophrenia (HCC) 10/18/2013  . Human immunodeficiency virus (HIV) disease (HCC) 07/27/2011    Past Surgical History:  Procedure Laterality Date  . CESAREAN SECTION N/A 12/24/2013   Procedure: CESAREAN SECTION;  Surgeon: Lesly Dukes, MD;  Location: [facility];  Service: Obstetrics;  Laterality: N/A;  . COLPOSCOPY  2009  . DENTAL RESTORATION/EXTRACTION WITH X-RAY       OB History    Gravida  5   Para  3   Term  1   Preterm  2   AB  1   Living  2     SAB  1   TAB  0   Ectopic  0   Multiple  0   Live Births  3           Family History  Problem Relation Age of Onset  . Diabetes Mother   . High blood pressure Mother   . Cancer Father     Social History   Tobacco Use  . Smoking status: Current Every Day Smoker    Packs/day: 0.10    Years: 3.00    Pack years: 0.30    Types: Cigarettes  . Smokeless tobacco: Never Used  . Tobacco comment: cutting back, 1 cigs/day  Substance Use Topics  . Alcohol use: No    Alcohol/week: 0.0 standard drinks  . Drug  use: Yes    Frequency: 1.0 times per week    Types: Cocaine, Marijuana    Comment: cocaine 5/11, mj 2 weeks ago    Home Medications Prior to Admission medications   Medication Sig Start Date End Date Taking? Authorizing Provider  acetaminophen (TYLENOL) 325 MG tablet Take 2 tablets (650 mg total) by mouth every 4 (four) hours as needed (for pain scale < 4). Patient not taking: Reported on 06/14/2018 10/25/16   Adair Laundryarvalho Do Amaral, Ana, MD  azithromycin [facility](ZITHROMAX) 250 MG tablet Take 2 tabs today, followed by 1 tab daily until complete Patient not taking: Reported on 05/30/2019 05/02/18   Judyann MunsonSnider, Cynthia, MD  benztropine (COGENTIN) 0.5 MG tablet Take 0.5 mg by mouth daily as needed for tremors.  02/04/18   [provider]  BIKTARVY 50-200-25 MG TABS tablet Take 1 tablet  by mouth daily. 08/04/19   Blanchard Kelchixon, Stephanie N, NP  doravirine (PIFELTRO) 100 MG TABS tablet Take 1 tablet (100 mg total) by mouth daily. 07/14/18   Dixon, Gomez CleverlyStephanie N, NP  ENSURE (ENSURE) Take 237 mLs by mouth 2 (two) times daily between meals. 03/08/18   Judyann MunsonSnider, Cynthia, MD  HYDROcodone-acetaminophen (NORCO/VICODIN) 5-325 MG tablet Take 1 tablet by mouth every 6 (six) hours as needed for moderate pain or severe pain. 05/30/19   Mardella LaymanHagler, Brian, MD  hydrocortisone 2.5 % lotion Apply topically 2 (two) times daily. 02/20/19   Kuppelweiser, Cassie L, RPH-CPP  IBU 800 MG tablet Take 800 mg by mouth 2 (two) times daily as needed. 10/05/16   [provider]  LATUDA 40 MG TABS tablet TAKE one tablet BY MOUTH EVERY EVENING with meals 02/04/18   [provider]  methocarbamol (ROBAXIN) 500 MG tablet Take 500 mg by mouth 2 (two) times daily as needed for muscle spasms.  04/21/17   [provider]  penicillin v potassium (VEETID) 500 MG tablet Take 1 tablet (500 mg total) by mouth 3 (three) times daily. 05/30/19   Mardella LaymanHagler, Brian, MD  Prenatal Vit-Fe Fumarate-FA (PRENATAL COMPLETE) 14-0.4 MG TABS Take 1 tablet by mouth daily. 08/10/19   Alijah Akram, Waylan BogaAlexandra M, PA-C  sertraline (ZOLOFT) 50 MG tablet Take 1 tablet (50 mg total) by mouth daily. 08/06/14   Judyann MunsonSnider, Cynthia, MD    Allergies    Patient has no known allergies.  Review of Systems   Review of Systems  Constitutional: Negative for chills and fever.  HENT: Negative for facial swelling and sore throat.   Respiratory: Negative for shortness of breath.   Cardiovascular: Negative for chest pain.  Gastrointestinal: Positive for abdominal pain, diarrhea (resolved), nausea and vomiting (resolved).  Genitourinary: Negative for dysuria, vaginal bleeding and vaginal discharge.  Musculoskeletal: Negative for back pain.  Skin: Negative for rash and wound.  Neurological: Negative for headaches.  Psychiatric/Behavioral: The patient is not  nervous/anxious.     Physical Exam Updated Vital Signs BP (!) 130/92   Pulse 94   Temp 98.4 F (36.9 C) (Oral)   Resp 16   LMP 06/30/2019   SpO2 100%   Physical Exam Vitals and nursing note reviewed. Exam conducted with a chaperone present.  Constitutional:      General: She is not in acute distress.    Appearance: She is well-developed. She is not diaphoretic.  HENT:     Head: Normocephalic and atraumatic.     Mouth/Throat:     Pharynx: No oropharyngeal exudate.  Eyes:     General: No scleral icterus.  Right eye: No discharge.        Left eye: No discharge.     Conjunctiva/sclera: Conjunctivae normal.     Pupils: Pupils are equal, round, and reactive to light.  Neck:     Thyroid: No thyromegaly.  Cardiovascular:     Rate and Rhythm: Normal rate and regular rhythm.     Heart sounds: Normal heart sounds. No murmur. No friction rub. No gallop.   Pulmonary:     Effort: Pulmonary effort is normal. No respiratory distress.     Breath sounds: Normal breath sounds. No stridor. No wheezing or rales.  Abdominal:     General: Bowel sounds are normal. There is no distension.     Palpations: Abdomen is soft.     Tenderness: There is abdominal tenderness in the suprapubic area. There is no right CVA tenderness, left CVA tenderness, guarding or rebound.  Genitourinary:    Vagina: Vaginal discharge (white, milky) present.     Cervix: No cervical motion tenderness.     Adnexa: Right adnexa normal and left adnexa normal.       Right: No tenderness.         Left: No tenderness.       Comments: Tender over the bladder Musculoskeletal:     Cervical back: Normal range of motion and neck supple.  Lymphadenopathy:     Cervical: No cervical adenopathy.  Skin:    General: Skin is warm and dry.     Coloration: Skin is not pale.     Findings: No rash.  Neurological:     Mental Status: She is alert.     Coordination: Coordination normal.     ED Results / Procedures / Treatments    Labs (all labs ordered are listed, but only abnormal results are displayed) Labs Reviewed  WET PREP, GENITAL - Abnormal; Notable for the following components:      Result Value   Clue Cells Wet Prep HPF POC PRESENT (*)    WBC, Wet Prep HPF POC FEW (*)    All other components within normal limits  COMPREHENSIVE METABOLIC PANEL - Abnormal; Notable for the following components:   Glucose, Bld 130 (*)    Total Protein 9.4 (*)    All other components within normal limits  CBC - Abnormal; Notable for the following components:   WBC 13.7 (*)    RBC 5.47 (*)    HCT 48.4 (*)    All other components within normal limits  I-STAT BETA HCG BLOOD, ED ([facility], [facility], [facility] ONLY) - Abnormal; Notable for the following components:   I-stat hCG, quantitative 227.7 (*)    All other components within normal limits  LIPASE, BLOOD  URINALYSIS, ROUTINE W REFLEX MICROSCOPIC  RPR  GC/CHLAMYDIA PROBE AMP ([place]) NOT AT [facility]    EKG None  Radiology US OB Comp < 14 Wks  Result Date: 08/10/2019 CLINICAL DATA:  Initial evaluation for acute nausea vomiting, lower abdominal pain. Early pregnancy. EXAM: OBSTETRIC <14 WK [place] AND TRANSVAGINAL OB US TECHNIQUE: Both transabdominal and transvaginal ultrasound examinations were performed for complete evaluation of the gestation as well as the maternal uterus, adnexal regions, and pelvic cul-de-sac. Transvaginal technique was performed to assess early pregnancy. COMPARISON:  None available. FINDINGS: Intrauterine gestational sac: Negative. Yolk sac:  Negative. Embryo:  Negative. Cardiac Activity: Negative. Heart Rate: N/A bpm Subchorionic hemorrhage:  None visualized. Maternal uterus/adnexae: Right ovary normal in appearance. Left ovary and adnexa not well assessed on this exam as the  patient terminated the examination early. No visible free fluid. IMPRESSION: 1. Early pregnancy with no discrete IUP or adnexal mass identified. Finding is consistent with a pregnancy of unknown  anatomic location. Differential considerations include IUP to early to visualize, recent SAB, or possibly occult ectopic pregnancy. Close clinical monitoring with serial beta HCGs and close interval follow-up ultrasound recommended as clinically warranted. Please note that the left adnexa is incompletely evaluated on this exam as the patient terminated the examination early. 2. No other visible acute maternal uterine or adnexal abnormality identified. Electronically Signed   By: Rise Mu M.D.   On: 08/10/2019 21:07   US OB Transvaginal  Result Date: 08/10/2019 CLINICAL DATA:  Initial evaluation for acute nausea vomiting, lower abdominal pain. Early pregnancy. EXAM: OBSTETRIC <14 WK [place] AND TRANSVAGINAL OB US TECHNIQUE: Both transabdominal and transvaginal ultrasound examinations were performed for complete evaluation of the gestation as well as the maternal uterus, adnexal regions, and pelvic cul-de-sac. Transvaginal technique was performed to assess early pregnancy. COMPARISON:  None available. FINDINGS: Intrauterine gestational sac: Negative. Yolk sac:  Negative. Embryo:  Negative. Cardiac Activity: Negative. Heart Rate: N/A bpm Subchorionic hemorrhage:  None visualized. Maternal uterus/adnexae: Right ovary normal in appearance. Left ovary and adnexa not well assessed on this exam as the patient terminated the examination early. No visible free fluid. IMPRESSION: 1. Early pregnancy with no discrete IUP or adnexal mass identified. Finding is consistent with a pregnancy of unknown anatomic location. Differential considerations include IUP to early to visualize, recent SAB, or possibly occult ectopic pregnancy. Close clinical monitoring with serial beta HCGs and close interval follow-up ultrasound recommended as clinically warranted. Please note that the left adnexa is incompletely evaluated on this exam as the patient terminated the examination early. 2. No other visible acute maternal uterine or  adnexal abnormality identified. Electronically Signed   By: Rise Mu M.D.   On: 08/10/2019 21:07    Procedures Procedures (including critical care time)  Medications Ordered in ED Medications - No data to display  ED Course  I have reviewed the triage vital signs and the nursing notes.  Pertinent labs & imaging results that were available during my care of the patient were reviewed by me and considered in my medical decision making (see chart for details).    MDM Rules/Calculators/A&P                      Patient presenting with nausea, vomiting, diarrhea as well as suprapubic pain.  She reports this started after eating some suspicious food.  She is also concerned that she is pregnant.  hCG is elevated at 227.7.  Otherwise, labs are unremarkable except for mild leukocytosis at 13.7.  Possibly due to vomiting.  UA does show trace leukocytes and 11-20 WBCs.  Urine is cloudy.  Will treat with Keflex and send culture. RPR, GC/chlamydia sent and pending.  Patient has an upcoming appointment with infectious disease for management of her HIV.  Last CD4 count 620.  Wet prep shows clue cells, but will refer to OB/GYN for treatment of this as patient denies any change in discharge.  Patient is tolerating food in the ED.  Ultrasound shows pregnancy of unknown origin.  Advised recheck of hCG levels in 48 hours and follow-up ultrasound.  Prenatal vitamins initiated.  Return precautions discussed.  Patient understands and agrees with plan.  Patient vitals stable and discharged in satisfactory condition.  Final Clinical Impression(s) / ED Diagnoses Final diagnoses:  Positive pregnancy  test  Abdominal pain during pregnancy in first trimester  Non-intractable vomiting with nausea, unspecified vomiting type    Rx / DC Orders ED Discharge Orders         Ordered    Prenatal Vit-Fe Fumarate-FA (PRENATAL COMPLETE) 14-0.4 MG TABS  Daily     08/10/19 2034           Frederica Kuster,  PA-C 08/10/19 2241    Virgel Manifold, MD 08/12/19 1536

## 2019-08-11 LAB — RPR
RPR Ser Ql: REACTIVE — AB
RPR Titer: 1:1 {titer}

## 2019-08-13 LAB — T.PALLIDUM AB, TOTAL: T Pallidum Abs: NONREACTIVE

## 2019-08-14 ENCOUNTER — Ambulatory Visit: Payer: Medicaid Other

## 2019-08-14 ENCOUNTER — Other Ambulatory Visit: Payer: Medicaid Other

## 2019-08-14 LAB — GC/CHLAMYDIA PROBE AMP (~~LOC~~) NOT AT ARMC
Chlamydia: NEGATIVE
Neisseria Gonorrhea: NEGATIVE

## 2019-08-22 ENCOUNTER — Telehealth: Payer: Self-pay

## 2019-08-22 ENCOUNTER — Telehealth: Payer: Self-pay | Admitting: Family Medicine

## 2019-08-22 NOTE — Telephone Encounter (Addendum)
[place] office requested chart be reviewed before follow up appts made. Per Ardean Larsen, CNM, pt needs stat beta HCG asap. Pregnancy viability needs to be confirmed prior to making any further appts. Called pt and VM left stating she needs to call our office regarding follow up care. Will attempt to contact pt again.  Pt contacted by front office and appt made.

## 2019-08-22 NOTE — Telephone Encounter (Signed)
Attempted to reach patient about coming in to get a STAT tomorrow. Was not able to reach her, and a voice message was left for her to call the office.

## 2019-08-23 ENCOUNTER — Ambulatory Visit: Payer: Medicaid Other

## 2019-08-25 NOTE — L&D Delivery Note (Addendum)
OB/GYN Faculty Practice Delivery Note  Tamara Briggs is a 34 y.o. [id number] s/p NSVD at [redacted]w[redacted]d. She was admitted for active labor.   ROM: rupture date, rupture time, delivery date, or delivery time have not been documented with clear fluid GBS Status: negative Maximum Maternal Temperature: 98.5*F  Labor Progress: Admitted complete/100/+1. Refused IV access and labs, so AZT was not able to be given during labor. Delivered after a prolonged second stage.   Delivery Date/Time: 03/25/20, 1343 Delivery: Called to room and patient was complete and pushing. Head delivered OA. No nuchal cord present. Shoulder and body delivered in usual fashion. Infant with spontaneous cry, placed on mother's abdomen, dried and stimulated. Cord clamped x 2 after 1-minute delay, and cut by patient's support person under my direct supervision. Cord blood drawn. Placenta delivered spontaneously with gentle cord traction. Fundus firm with massage. Labia, perineum, vagina, and cervix were inspected, and bilateral periurethral tears were noted- hemostatic and not repaired.   Placenta: 3 vessel cord, intact, to pathology Complications: None immediate Lacerations: bilateral periurethral tears were noted- hemostatic and not repaired EBL: 200 mL Analgesia: None  Postpartum Planning [x]  message to sent to schedule follow-up  [ ]  vaccines UTD  Infant: female  APGARs 8, 8  2195 g  , DO OB/GYN Fellow, Faculty Practice

## 2019-08-30 ENCOUNTER — Encounter: Payer: Medicaid Other | Admitting: Internal Medicine

## 2019-09-04 ENCOUNTER — Other Ambulatory Visit: Payer: Self-pay | Admitting: Infectious Diseases

## 2019-09-04 DIAGNOSIS — B20 Human immunodeficiency virus [HIV] disease: Secondary | ICD-10-CM

## 2019-09-04 MED ORDER — DORAVIRINE 100 MG PO TABS: 100.0000 mg | ORAL_TABLET | Freq: Every day | ORAL | 0 refills | Status: DC

## 2019-09-04 NOTE — Telephone Encounter (Signed)
Received medication refill for Biktary after review of chart patient also noted on Pifeltro and had a confirmed pregnancy test in December 2020. LPN consulted with Rexene Alberts, NP and discussed current ART regimen. Per Rexene Alberts, NP patient ok to continue current ART and follow up with [facility] in 2 weeks. Unfortunately LPN was not able to get in contact with patient and listed emergency contacts do not know patient whereabouts. Patient last seen in office 06/2018.  Medication refill sent to pharmacy in hopes of being able to reach patient in the future for provider visit.  Tamara Briggs

## 2019-09-11 ENCOUNTER — Telehealth: Payer: Self-pay | Admitting: Family Medicine

## 2019-09-11 NOTE — Telephone Encounter (Signed)
Additional information regarding plan of care for this pt. Upon review of pt record it was observed that pt's last BHCG was done on 12//17 @ [facility]. PT was scheduled for stat BHCG on 12/30 and received VM message regarding the appt on 12/29. She did not show for the 12/30 scheduled appt. Pt now needs BHCG and clinical review of symptoms re: pregnancy, presence of bleeding, pain, etc.

## 2019-09-11 NOTE — Telephone Encounter (Signed)
Received a call from patient that was very rude, and used vulgar language to express how upset she was because she has been trying to get an appointment for months. After seeing she was suppose to come in and get a STAT [facility], I spoke with Diane RN. Diane RN suggested she come in and get a non-stat, then have clinical to talk to her to see if she should be scheduled an ultrasound, or to see what her symptoms were. Then we would schedule accordingly. When I asked if she was able to come in today, she said she didn't have a way to get here because she lives in a shelter in [place]. I noticed she does have Medicaid, so I asked her if she would like to go to our office in [facility]. After cursing me out, and not letting me get a word in she hung up the phone.

## 2019-09-12 ENCOUNTER — Other Ambulatory Visit: Payer: Medicaid Other

## 2019-09-12 ENCOUNTER — Telehealth: Payer: Self-pay | Admitting: Obstetrics & Gynecology

## 2019-09-12 NOTE — Telephone Encounter (Signed)
Patient called to reschedule her appointment till next week. She stated she lives in a shelter in [place], and can not get to her appointment today.

## 2019-09-15 ENCOUNTER — Other Ambulatory Visit: Payer: Self-pay

## 2019-09-15 DIAGNOSIS — Z20822 Contact with and (suspected) exposure to covid-19: Secondary | ICD-10-CM

## 2019-09-16 LAB — NOVEL CORONAVIRUS, NAA: SARS-CoV-2, NAA: NOT DETECTED

## 2019-09-19 ENCOUNTER — Encounter: Payer: Self-pay | Admitting: Student

## 2019-09-19 ENCOUNTER — Other Ambulatory Visit: Payer: Self-pay

## 2019-09-19 ENCOUNTER — Other Ambulatory Visit (INDEPENDENT_AMBULATORY_CARE_PROVIDER_SITE_OTHER): Payer: Medicaid Other

## 2019-09-19 DIAGNOSIS — O3680X0 Pregnancy with inconclusive fetal viability, not applicable or unspecified: Secondary | ICD-10-CM

## 2019-09-19 NOTE — Progress Notes (Signed)
Pt reports she is feeling well. Pt reports some cramping and feels like it is gas. Pt with no vaginal discharge. Pt reports she has no questions or concerns. Pt informed that her lab will be back tomorrow and she will be called with results and plan of care. Pt voiced understanding.

## 2019-09-20 ENCOUNTER — Ambulatory Visit: Payer: Medicaid Other | Admitting: Pharmacist

## 2019-09-20 LAB — BETA HCG QUANT (REF LAB): hCG Quant: 88474 m[IU]/mL

## 2019-09-26 ENCOUNTER — Telehealth (INDEPENDENT_AMBULATORY_CARE_PROVIDER_SITE_OTHER): Payer: Medicaid Other | Admitting: General Practice

## 2019-09-26 DIAGNOSIS — Z3491 Encounter for supervision of normal pregnancy, unspecified, first trimester: Secondary | ICD-10-CM

## 2019-09-26 DIAGNOSIS — O09521 Supervision of elderly multigravida, first trimester: Secondary | ICD-10-CM

## 2019-09-26 MED ORDER — PRENATAL PLUS 27-1 MG PO TABS: 1.0000 | ORAL_TABLET | Freq: Every day | ORAL | 11 refills | Status: DC

## 2019-09-26 NOTE — Telephone Encounter (Signed)
Patient called into front office speaking with a staff member requesting appt to start care and PNV Rx. Patient is unsure how far along she is. PNV sent in per protocol. Ultrasound ordered to establish dating and scheduled for 2/8 @ 145pm.    Called & informed patient of ultrasound appt. Patient verbalized understanding.

## 2019-10-02 ENCOUNTER — Encounter: Payer: Self-pay | Admitting: Family Medicine

## 2019-10-02 ENCOUNTER — Ambulatory Visit (HOSPITAL_COMMUNITY)
Admission: RE | Admit: 2019-10-02 | Discharge: 2019-10-02 | Disposition: A | Discharge status: Home / Self Care | Payer: Medicaid Other | Source: Ambulatory Visit | Attending: Family Medicine | Admitting: Family Medicine

## 2019-10-02 ENCOUNTER — Other Ambulatory Visit: Payer: Self-pay

## 2019-10-02 ENCOUNTER — Ambulatory Visit (INDEPENDENT_AMBULATORY_CARE_PROVIDER_SITE_OTHER): Payer: Medicaid Other

## 2019-10-02 DIAGNOSIS — Z3491 Encounter for supervision of normal pregnancy, unspecified, first trimester: Secondary | ICD-10-CM | POA: Insufficient documentation

## 2019-10-02 DIAGNOSIS — Z712 Person consulting for explanation of examination or test findings: Secondary | ICD-10-CM

## 2019-10-02 NOTE — Progress Notes (Signed)
Pt here today for results following [place] for uncertain LMP. US shows pt to be 12w today with EDD of 04/15/20. Results reviewed with Adrian Blackwater, DO who finds single-living IUP and recommends pt begin prenatal care. Current medications reviewed; Stinson, DO states pt may continue all medications. Recommends pt follow up with Infectious Disease provider to notify them she is pregnant.   Results and provider recommendation reviewed with pt. [place] photos given. Medications and allergies reviewed with pt; list of medications safe to take during pregnancy given. Front office notified to schedule prenatal appts. Pt encouraged to follow up with our office as needed prior to new OB appts.   Fleet Contras RN 10/02/19

## 2019-10-03 NOTE — Progress Notes (Signed)
Chart reviewed - agree with CMA/RN documentation.  ° °

## 2019-10-11 ENCOUNTER — Ambulatory Visit: Payer: Medicaid Other | Admitting: Pharmacist

## 2019-10-16 ENCOUNTER — Ambulatory Visit: Payer: Medicaid Other | Admitting: Pharmacist

## 2019-10-19 ENCOUNTER — Encounter: Payer: Medicaid Other | Admitting: Obstetrics and Gynecology

## 2019-10-19 ENCOUNTER — Encounter: Payer: Self-pay | Admitting: Obstetrics and Gynecology

## 2019-10-20 NOTE — Progress Notes (Signed)
Patient did not keep her OB appointment for 10/19/2019.  Aqua Denslow, Jr MD [facility]ulty Practice)   

## 2019-10-22 NOTE — Progress Notes (Signed)
Chart reviewed for nurse visit. Agree with plan of care.   Marylene Land, CNM 10/22/2019 7:47 AM

## 2019-10-24 ENCOUNTER — Telehealth: Payer: Self-pay | Admitting: *Deleted

## 2019-10-24 NOTE — Telephone Encounter (Signed)
Patient called to let us know she has moved back to [place].  RN updated demographic information, confirmed phone numbers.  Herman asked if she could have an appointment Monday 3/8 afternoon (time constraints due to court date and transportation issues).  RN scheduled her with Judeth Cornfield and Cassie. She has a new OB appointment 3/9. Andree Coss, RN

## 2019-10-25 NOTE — Telephone Encounter (Signed)
Absolutely fine

## 2019-10-30 ENCOUNTER — Ambulatory Visit: Payer: Medicaid Other | Admitting: Pharmacist

## 2019-10-30 ENCOUNTER — Ambulatory Visit: Payer: Medicaid Other | Admitting: Infectious Diseases

## 2019-10-31 ENCOUNTER — Ambulatory Visit (INDEPENDENT_AMBULATORY_CARE_PROVIDER_SITE_OTHER): Payer: Medicaid Other | Admitting: Family Medicine

## 2019-10-31 ENCOUNTER — Encounter: Payer: Self-pay | Admitting: Family Medicine

## 2019-10-31 ENCOUNTER — Other Ambulatory Visit: Payer: Self-pay

## 2019-10-31 VITALS — BP 105/60 | HR 80 | Wt 100.1 lb

## 2019-10-31 DIAGNOSIS — Z91199 Patient's noncompliance with other medical treatment and regimen due to unspecified reason: Secondary | ICD-10-CM

## 2019-10-31 DIAGNOSIS — Z5329 Procedure and treatment not carried out because of patient's decision for other reasons: Secondary | ICD-10-CM

## 2019-10-31 DIAGNOSIS — O0990 Supervision of high risk pregnancy, unspecified, unspecified trimester: Secondary | ICD-10-CM

## 2019-10-31 NOTE — Progress Notes (Signed)
Patient left before being seen as she had another appt. Please reschedule.   Jerilynn Birkenhead, MD [facility] Fellow, [facility] for [employer], [facility]

## 2019-10-31 NOTE — Progress Notes (Signed)
Pt states used bathroom before she came to visit & water is not going to help her Urine.   Pt decide to re-schedule appointment to go to [facility] before they closed.

## 2019-11-08 ENCOUNTER — Encounter: Payer: Medicaid Other | Admitting: Obstetrics and Gynecology

## 2019-11-20 ENCOUNTER — Encounter: Payer: Medicaid Other | Admitting: Family Medicine

## 2019-11-20 ENCOUNTER — Telehealth: Payer: Self-pay | Admitting: Obstetrics & Gynecology

## 2019-11-20 NOTE — Telephone Encounter (Signed)
Attempted to reach [facility] about her missed appointment. Was not able to reach her, but was able to leave her a message about missing her appointment and rescheduling.

## 2019-11-30 ENCOUNTER — Ambulatory Visit (INDEPENDENT_AMBULATORY_CARE_PROVIDER_SITE_OTHER): Payer: Medicaid Other | Admitting: Internal Medicine

## 2019-11-30 ENCOUNTER — Ambulatory Visit: Payer: Medicaid Other | Admitting: Pharmacist

## 2019-11-30 ENCOUNTER — Ambulatory Visit: Payer: Medicaid Other

## 2019-11-30 ENCOUNTER — Ambulatory Visit (INDEPENDENT_AMBULATORY_CARE_PROVIDER_SITE_OTHER): Payer: Medicaid Other | Admitting: Pharmacist

## 2019-11-30 ENCOUNTER — Other Ambulatory Visit: Payer: Self-pay

## 2019-11-30 DIAGNOSIS — K0889 Other specified disorders of teeth and supporting structures: Secondary | ICD-10-CM | POA: Diagnosis not present

## 2019-11-30 DIAGNOSIS — Z3490 Encounter for supervision of normal pregnancy, unspecified, unspecified trimester: Secondary | ICD-10-CM | POA: Diagnosis not present

## 2019-11-30 DIAGNOSIS — F209 Schizophrenia, unspecified: Secondary | ICD-10-CM | POA: Diagnosis not present

## 2019-11-30 DIAGNOSIS — B20 Human immunodeficiency virus [HIV] disease: Secondary | ICD-10-CM

## 2019-11-30 NOTE — Progress Notes (Signed)
HPI: Tamara Briggs is a 34 y.o. female who presents to the [facility] clinic for HIV follow-up.  Patient Active Problem List   Diagnosis Date Noted  . Supervision of high risk pregnancy, antepartum 10/31/2019  . Routine screening for STI (sexually transmitted infection) July 21, 2018  . Encounter for contraceptive management 07/21/18  . Healthcare maintenance 07-21-18  . Neonatal death March 31, 2018  . 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 History of gestational diabetes mellitus (GDM) in prior pregnancy, currently pregnant 01/19/2018  . Pre-diabetes 01/13/2018  . Protein-calorie malnutrition, severe ([place]) 11/24/2017  . Crack cocaine use   . Bipolar disorder ([place])   . History of VBAC 08/31/2016  . 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 Tardive dyskinesia 06/13/2014  . History of loop electrical excision procedure (LEEP) 12/25/2013  . 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 Schizophrenia ([place]) 10/18/2013  . Human immunodeficiency virus (HIV) disease ([place]) 07/27/2011    Patient's Medications  New Prescriptions   No medications on file  Previous Medications   ACETAMINOPHEN (TYLENOL) 325 MG TABLET    Take 2 tablets (650 mg total) by mouth every 4 (four) hours as needed (for pain scale < 4).   BENZTROPINE (COGENTIN) 0.5 MG TABLET    Take 0.5 mg by mouth daily as needed for tremors.    BIKTARVY 50-200-25 MG TABS TABLET    Take 1 tablet by mouth daily.   DORAVIRINE (PIFELTRO) 100 MG TABS TABLET    Take 1 tablet (100 mg total) by mouth daily.   ENSURE (ENSURE)    Take 237 mLs by mouth 2 (two) times daily between meals.   HYDROCODONE-ACETAMINOPHEN (NORCO/VICODIN) 5-325 MG TABLET    Take 1 tablet by mouth every 6 (six) hours as needed for moderate pain or severe pain.   HYDROCORTISONE 2.5 % LOTION    Apply topically 2 (two) times daily.   IBU 800 MG TABLET    Take 800 mg by mouth 2 (two) times daily as needed.   LATUDA 40 MG TABS TABLET    TAKE one tablet BY MOUTH EVERY EVENING  with meals   METHOCARBAMOL (ROBAXIN) 500 MG TABLET    Take 500 mg by mouth 2 (two) times daily as needed for muscle spasms.    PRENATAL VITAMIN W/FE, FA (PRENATAL 1 + 1) 27-1 MG TABS TABLET    Take 1 tablet by mouth daily at 12 noon.   SERTRALINE (ZOLOFT) 50 MG TABLET    Take 1 tablet (50 mg total) by mouth daily.  Modified Medications   No medications on file  Discontinued Medications   No medications on file    Allergies: No Known Allergies  Past Medical History: Past Medical History:  Diagnosis Date  . Anxiety   . Bipolar disorder ([place])   . Crack cocaine use   . Depression   . h/o crack Cocaine abuse 03/15/2013   UDS positive cocaine  . 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  AFP increased and sent to genetics.  Amnio declined.  [place] nml.   Marland Kitchen HIV test positive ([place])   . Infection   . Otitis media of left ear 03/15/2013  . Schizophrenia ([place])   . 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 Tardive dyskinesia 06/13/2014  . Tobacco abuse 03/15/2013    Social History: Social History   Socioeconomic History  . Marital status: Single    Spouse name: Not on file  . Number of  children: 1  . Years of education: 11th  . Highest education level: Not on file  Occupational History  . Not on file  Tobacco Use  . Smoking status: Current Every Day Smoker    Packs/day: 0.10    Years: 3.00    Pack years: 0.30    Types: Cigarettes  . Smokeless tobacco: Never Used  . Tobacco comment: cutting back, 1 cigs/day  Substance and Sexual Activity  . Alcohol use: No    Alcohol/week: 0.0 standard drinks  . Drug use: Yes    Frequency: 1.0 times per week    Types: Cocaine, Marijuana    Comment: cocaine 5/11, mj 2 weeks ago  . Sexual activity: Yes    Partners: Male    Birth control/protection: Injection    Comment: givne condoms  Other Topics Concern  . Not on file  Social History Narrative   Patient lives at home with her  partner [name].   Unemployed.   Education 11 th grade.   Right handed.   Patient does not drink caffeine.   Social Determinants of Health   Financial Resource Strain:   . Difficulty of Paying Living Expenses:   Food Insecurity:   . Worried About Programme researcher, broadcasting/film/video in the Last Year:   . Barista in the Last Year:   Transportation Needs:   . Freight forwarder (Medical):   Marland Kitchen Lack of Transportation (Non-Medical):   Physical Activity:   . Days of Exercise per Week:   . Minutes of Exercise per Session:   Stress:   . Feeling of Stress :   Social Connections:   . Frequency of Communication with Friends and Family:   . Frequency of Social Gatherings with Friends and Family:   . Attends Religious Services:   . Active Member of Clubs or Organizations:   . Attends Banker Meetings:   Marland Kitchen Marital Status:     Labs: Lab Results  Component Value Date   HIV1RNAQUANT 64 (H) 02/20/2019   HIV1RNAQUANT 33 (H) 09/19/2018   HIV1RNAQUANT 42 (H) 08/10/2018   CD4TABS 620 02/20/2019   CD4TABS 310 (L) 09/19/2018   CD4TABS 810 08/10/2018    RPR and STI Lab Results  Component Value Date   LABRPR Reactive (A) 08/10/2019   LABRPR NON-REACTIVE 02/20/2019   LABRPR NON-REACTIVE 08/10/2018   LABRPR Non Reactive 06/06/2018   LABRPR Non Reactive 04/28/2018    STI Results GC GC CT CT  Latest Ref Rng & Units - NEGATIVE - NEGATIVE  08/10/2019 Negative - Negative -  02/20/2019 Negative - Negative -  07/14/2018 Negative - Negative -  05/27/2018 Negative - Negative -  04/20/2018 Negative - **POSITIVE**(A) -  01/05/2018 Negative - POSITIVE(A) -  06/30/2016 Negative - Negative -  08/28/2014 NG: Negative - CT: Negative -  07/20/2014 - NEGATIVE - POSITIVE(A)  04/24/2014 - NEGATIVE - POSITIVE(A)  12/14/2013 - NEGATIVE Negative NEGATIVE  09/16/2012 - NEGATIVE - POSITIVE(A)  10/23/2007 - - NEGATIVERTFDELIM(NOTE)  Testing performed using the BD Probetec ET Chlamydia trachomatis and  Neisseria gonorrhea amplified DNA assay. -  04/04/2007 - - NEGATIVERTFDELIM(NOTE)  Testing performed using the BD Probetec ET Chlamydia trachomatis and Neisseria gonorrhea amplified DNA assay. -    Hepatitis B Lab Results  Component Value Date   HEPBSAB POS (A) 07/09/2011   HEPBSAG Negative 01/05/2018   HEPBCAB NEG 07/09/2011   Hepatitis C No results found for: HEPCAB, HCVRNAPCRQN Hepatitis A Lab Results  Component Value Date  HAV NEG 07/09/2011   Lipids: Lab Results  Component Value Date   CHOL 127 10/31/2015   TRIG 97 10/31/2015   HDL 63 10/31/2015   CHOLHDL 2.0 10/31/2015   VLDL 19 10/31/2015   LDLCALC 45 10/31/2015    Current HIV Regimen: Biktarvy + Pilfeltro  Assessment: Avangelina is here today to follow-up for her HIV infection. She has had several no shows, and she was last seen with Cassie on 02/20/19 and by [facility] on 07/14/18.  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 She is taking her Biktarvy in the AM and Pifeltro in the PM. She has missed about 2 doses in the last month. We advised her to take Biktarvy and Pifeltro together at the same time of day to help her remember to take them both.  She is pregnant and is due August 23. She saw OB in March and has another appointment in April 28, and she is taking a prenatal vitamin from the OB/gyn office. She follows with [facility] for her mental health medications, and she continues to take Zoloft and Jordan. She expresses that they have increased the dose of Latuda, which has caused bothersome side effects including tremor and sleepiness. She inquires today about a dentist due to currently having a cavity causing migraines. We also provided her with a bag of food from our pantry.  She also saw Dr. Drue Second today, and we will get labs.  Plan: - Continue Biktarvy and Pifeltro and take them at the same time. - Check HIV RNA, CD4, CMP, CBC, RPR - Follow-up with Dr. Drue Second in 4  weeks  Gae Gallop PharmD Candidate 11/30/2019, 2:47 PM

## 2019-11-30 NOTE — Progress Notes (Signed)
RFV: back in care and pregnant  Patient ID: Tamara Briggs, female   DOB: 19-Jun-1986, 34 y.o.   MRN: [id number]  HPI  34yo F with long standing hiv disease,schizophrenia, hx of drug use, who has been on biktarvy-doravirine. Last seen > 18 months ago. Re-establishing into care. She reports that she has not stopped taking her hiv medications. Still good adherence. She is roughly 5 months pregnant, her partner, Alinda Money, have been together for 1.5 yrs. Has been established with women's clinic for her pregnancy. She has next appt. April 28th - women's clinic ob/Due date august 23rd.  Outpatient Encounter Medications as of 11/30/2019  Medication Sig  . acetaminophen (TYLENOL) 325 MG tablet Take 2 tablets (650 mg total) by mouth every 4 (four) hours as needed (for pain scale < 4).  . benztropine (COGENTIN) 0.5 MG tablet Take 0.5 mg by mouth daily as needed for tremors.   Marland Kitchen BIKTARVY 50-200-25 MG TABS tablet Take 1 tablet by mouth daily.  . doravirine (PIFELTRO) 100 MG TABS tablet Take 1 tablet (100 mg total) by mouth daily.  Marland Kitchen ENSURE (ENSURE) Take 237 mLs by mouth 2 (two) times daily between meals. (Patient not taking: Reported on 10/31/2019)  . HYDROcodone-acetaminophen (NORCO/VICODIN) 5-325 MG tablet Take 1 tablet by mouth every 6 (six) hours as needed for moderate pain or severe pain. (Patient not taking: Reported on 10/31/2019)  . hydrocortisone 2.5 % lotion Apply topically 2 (two) times daily.  . IBU 800 MG tablet Take 800 mg by mouth 2 (two) times daily as needed.  Marland Kitchen LATUDA 40 MG TABS tablet TAKE one tablet BY MOUTH EVERY EVENING with meals  . methocarbamol (ROBAXIN) 500 MG tablet Take 500 mg by mouth 2 (two) times daily as needed for muscle spasms.   . prenatal vitamin w/FE, FA (PRENATAL 1 + 1) 27-1 MG TABS tablet Take 1 tablet by mouth daily at 12 noon.  . sertraline (ZOLOFT) 50 MG tablet Take 1 tablet (50 mg total) by mouth daily.   No facility-administered encounter medications on file as of  11/30/2019.     Patient Active Problem List   Diagnosis Date Noted  . Supervision of high risk pregnancy, antepartum 10/31/2019  . Routine screening for STI (sexually transmitted infection) 2018/07/22  . Encounter for contraceptive management 2018-07-22  . Healthcare maintenance 2018/07/22  . Neonatal death 2018-04-01  . 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 History of gestational diabetes mellitus (GDM) in prior pregnancy, currently pregnant 01/19/2018  . Pre-diabetes 01/13/2018  . Protein-calorie malnutrition, severe (H[facility]) 11/24/2017  . Crack cocaine use   . Bipolar disorder ([facility])   . History of VBAC 08/31/2016  . 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 Tardive dyskinesia 06/13/2014  . History of loop electrical excision procedure (LEEP) 12/25/2013  . 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 Schizophrenia ([facility]) 10/18/2013  . Human immunodeficiency virus (HIV) disease ([facility]) 07/27/2011     Health Maintenance Due  Topic Date Due  . PAP SMEAR-Modifier  07/08/2018     Review of Systems  Constitutional: Negative for fever, chills, diaphoresis, activity change, appetite change, fatigue and unexpected weight change.  HENT: Negative for congestion, sore throat, rhinorrhea, sneezing, trouble swallowing and sinus pressure.  Eyes: Negative for photophobia and visual disturbance.  Respiratory: Negative for cough, chest tightness, shortness of breath, wheezing and stridor.  Cardiovascular: Negative for chest pain, palpitations and leg swelling.  Gastrointestinal: Negative for nausea, vomiting, abdominal pain, diarrhea, constipation, blood in stool, abdominal distention and anal bleeding.  Genitourinary:  Negative for dysuria, hematuria, flank pain and difficulty urinating.  Musculoskeletal: Negative for myalgias, back pain, joint swelling, arthralgias and gait problem.  Skin: Negative for color change, pallor, rash and wound.  Neurological: Negative for dizziness, tremors, weakness  and light-headedness.  Hematological: Negative for adenopathy. Does not bruise/bleed easily.  Psychiatric/Behavioral: Negative for behavioral problems, confusion, sleep disturbance, dysphoric mood, decreased concentration and agitation.    Physical Exam   LMP 06/30/2019   Physical Exam  Constitutional:  oriented to person, place, and time. appears well-developed and well-nourished. No distress.  HENT: [place]/AT, PERRLA, no scleral icterus Mouth/Throat: Oropharynx is clear and moist. No oropharyngeal exudate.  Cardiovascular: Normal rate, regular rhythm and normal heart sounds. Exam reveals no gallop and no friction rub.  No murmur heard.  Pulmonary/Chest: Effort normal and breath sounds normal. No respiratory distress.  has no wheezes.  Neck = supple, no nuchal rigidity Abdominal: Soft. Bowel sounds are normal.  Gravid abdomen Lymphadenopathy: no cervical adenopathy. No axillary adenopathy Neurological: alert and oriented to person, place, and time.  Skin: Skin is warm and dry. No rash noted. No erythema.  Psychiatric: a normal mood and affect.  behavior is normal.   Lab Results  Component Value Date   CD4TCELL 42 02/20/2019   Lab Results  Component Value Date   CD4TABS 620 02/20/2019   CD4TABS 310 (L) 09/19/2018   CD4TABS 810 08/10/2018   Lab Results  Component Value Date   HIV1RNAQUANT 64 (H) 02/20/2019   Lab Results  Component Value Date   HEPBSAB POS (A) 07/09/2011   Lab Results  Component Value Date   LABRPR Reactive (A) 08/10/2019    CBC Lab Results  Component Value Date   WBC 13.7 (H) 08/10/2019   RBC 5.47 (H) 08/10/2019   HGB 15.0 08/10/2019   HCT 48.4 (H) 08/10/2019   PLT 227 08/10/2019   MCV 88.5 08/10/2019   MCH 27.4 08/10/2019   MCHC 31.0 08/10/2019   RDW 13.3 08/10/2019   LYMPHSABS 803 (L) 09/19/2018   MONOABS 432 04/06/2017   EOSABS 187 09/19/2018    BMET Lab Results  Component Value Date   NA 136 08/10/2019   K 4.2 08/10/2019   CL 104  08/10/2019   CO2 25 08/10/2019   GLUCOSE 130 (H) 08/10/2019   BUN 19 08/10/2019   CREATININE 0.75 08/10/2019   CALCIUM 9.3 08/10/2019   GFRNONAA >60 08/10/2019   GFRAA >60 08/10/2019      Assessment and Plan hiv disease = will check labs to see if she is undetectable. May need to adjust her regimen depending on results  Dental pain = will have her be seen by dental team, see if margaret can get Dental appointment  Health maintenance = will get her covid vaccine appointnment  Schizophrenia = continue to get care through [facility]. She appears quite stable at this time.

## 2019-12-01 ENCOUNTER — Encounter: Payer: Medicaid Other | Admitting: Obstetrics & Gynecology

## 2019-12-01 LAB — T-HELPER CELL (CD4) - (RCID CLINIC ONLY)
CD4 % Helper T Cell: 32 % — ABNORMAL LOW (ref 33–65)
CD4 T Cell Abs: 331 /uL — ABNORMAL LOW (ref 400–1790)

## 2019-12-02 ENCOUNTER — Ambulatory Visit: Payer: Medicaid Other

## 2019-12-02 LAB — COMPLETE METABOLIC PANEL WITH GFR
AG Ratio: 1.1 (calc) (ref 1.0–2.5)
ALT: 10 U/L (ref 6–29)
AST: 18 U/L (ref 10–30)
Albumin: 3.3 g/dL — ABNORMAL LOW (ref 3.6–5.1)
Alkaline phosphatase (APISO): 50 U/L (ref 31–125)
BUN: 15 mg/dL (ref 7–25)
CO2: 27 mmol/L (ref 20–32)
Calcium: 8 mg/dL — ABNORMAL LOW (ref 8.6–10.2)
Chloride: 104 mmol/L (ref 98–110)
Creat: 0.62 mg/dL (ref 0.50–1.10)
GFR, Est African American: 136 mL/min/{1.73_m2} (ref >=60)
GFR, Est Non African American: 118 mL/min/{1.73_m2} (ref >=60)
Globulin: 2.9 g/dL (calc) (ref 1.9–3.7)
Glucose, Bld: 91 mg/dL (ref 65–99)
Potassium: 3.4 mmol/L — ABNORMAL LOW (ref 3.5–5.3)
Sodium: 135 mmol/L (ref 135–146)
Total Bilirubin: 0.2 mg/dL (ref 0.2–1.2)
Total Protein: 6.2 g/dL (ref 6.1–8.1)

## 2019-12-02 LAB — CBC WITH DIFFERENTIAL/PLATELET
Absolute Monocytes: 414 cells/uL (ref 200–950)
Basophils Absolute: 9 cells/uL (ref 0–200)
Basophils Relative: 0.2 %
Eosinophils Absolute: 9 cells/uL — ABNORMAL LOW (ref 15–500)
Eosinophils Relative: 0.2 %
HCT: 28.7 % — ABNORMAL LOW (ref 35.0–45.0)
Hemoglobin: 9.6 g/dL — ABNORMAL LOW (ref 11.7–15.5)
Lymphs Abs: 1053 cells/uL (ref 850–3900)
MCH: 28.8 pg (ref 27.0–33.0)
MCHC: 33.4 g/dL (ref 32.0–36.0)
MCV: 86.2 fL (ref 80.0–100.0)
MPV: 9.1 fL (ref 7.5–12.5)
Monocytes Relative: 8.8 %
Neutro Abs: 3215 cells/uL (ref 1500–7800)
Neutrophils Relative %: 68.4 %
Platelets: 146 10*3/uL (ref 140–400)
RBC: 3.33 10*6/uL — ABNORMAL LOW (ref 3.80–5.10)
RDW: 13.6 % (ref 11.0–15.0)
Total Lymphocyte: 22.4 %
WBC: 4.7 10*3/uL (ref 3.8–10.8)

## 2019-12-02 LAB — HIV-1 RNA QUANT-NO REFLEX-BLD
HIV 1 RNA Quant: 32400 copies/mL — ABNORMAL HIGH
HIV-1 RNA Quant, Log: 4.51 Log copies/mL — ABNORMAL HIGH

## 2019-12-02 LAB — RPR: RPR Ser Ql: NONREACTIVE

## 2019-12-12 ENCOUNTER — Telehealth: Payer: Self-pay

## 2019-12-12 NOTE — Telephone Encounter (Signed)
Patient called office today requesting refill on Ensure. Will need signed prescription from provider before [facility] can fill. Patient is also interested in starting birth control. Is refusing to have tubes tied (per case manager) would like to know is she would be able to start depo.  Patient's lastly requested to resume home visits from [place], [place].  Lorenso Courier, [place]

## 2019-12-14 ENCOUNTER — Telehealth: Payer: Self-pay

## 2019-12-14 ENCOUNTER — Other Ambulatory Visit: Payer: Self-pay

## 2019-12-14 DIAGNOSIS — B20 Human immunodeficiency virus [HIV] disease: Secondary | ICD-10-CM

## 2019-12-14 NOTE — Telephone Encounter (Signed)
Patient called requesting refill on hydrocortisone cream. Unable to do so due to restrictions related to pregnancy and toxicity warnings. Will forward to provider for review and alternatives. Called patient back and left VM informing her that I am unable to refill it.   Issabela Lesko Loyola Mast, RN

## 2019-12-15 NOTE — Telephone Encounter (Signed)
If she is using cream for itching, can do a benadryl cream. Can ask her to call her ob doctors for recs on different cream

## 2019-12-15 NOTE — Telephone Encounter (Signed)
Patient called to try benadryl cream for itching; recommended speaking with OB provider for further recommendations on creams. Patient verbalized understanding.   Azlin Zilberman Loyola Mast, RN

## 2019-12-20 ENCOUNTER — Encounter: Payer: Medicaid Other | Admitting: Obstetrics & Gynecology

## 2019-12-20 ENCOUNTER — Encounter: Payer: Self-pay | Admitting: Family Medicine

## 2019-12-20 ENCOUNTER — Telehealth: Payer: Self-pay | Admitting: Family Medicine

## 2019-12-20 NOTE — Telephone Encounter (Signed)
Called patient with no answer, sent patient a letter about her missed appointment today.

## 2020-01-04 ENCOUNTER — Ambulatory Visit: Payer: Medicaid Other | Admitting: Internal Medicine

## 2020-01-10 ENCOUNTER — Telehealth: Payer: Self-pay | Admitting: Family Medicine

## 2020-01-10 NOTE — Telephone Encounter (Signed)
Received a call from the patient stating she was having a hard time getting to her appointments. She now lives in [place]. I transferred the call to the [facility] office.

## 2020-01-29 ENCOUNTER — Telehealth: Payer: Self-pay

## 2020-01-29 NOTE — Telephone Encounter (Signed)
Patient called stating that she was instructed by [facility] to call our office since she will be transferring here. She needs proof of pregnancy sent to [facility] office  Fax [phone]  I called the [facility] office and they state they dont need proof of pregnancy for patient to participate in their program but we can fax it so it will be on file. Armandina Stammer RN

## 2020-02-01 ENCOUNTER — Ambulatory Visit: Payer: Medicaid Other | Admitting: Internal Medicine

## 2020-02-02 ENCOUNTER — Telehealth: Payer: Self-pay | Admitting: *Deleted

## 2020-02-02 DIAGNOSIS — B20 Human immunodeficiency virus [HIV] disease: Secondary | ICD-10-CM

## 2020-02-02 DIAGNOSIS — R21 Rash and other nonspecific skin eruption: Secondary | ICD-10-CM

## 2020-02-02 NOTE — Telephone Encounter (Signed)
We can send in benadryl cream. I feel like she is super high risk in having a HIV + baby. Can mitch be her case worker?

## 2020-02-02 NOTE — Telephone Encounter (Signed)
Patient called to see if she can have a prescription for benadryl cream. She states she has terrible itching all over her body.  She cannot take the hydrocortisone cream due to pregnancy. She has not connected to new OB, has appointment 6/23.   She has moved to [place], will google [facility] [place] to connect.  She states she has biktarvy and pifeltro (last sent to [facility] 08/2019, states she had it delivered in April). She states she feels like a pharmacist, that she is carrying around so many pills.  RN offered an appointment with pharmacist or nurse to help her go through her medications, but patient declined.  She says she is having a hard time taking her HIV meds, states that 6 pills a day is just a lot. She feels like she is being punished.  RN explained why she was supposed to take both Biktarvy/Pifeltro, patient stated she understands better now and she will try harder to take them every day.  Patient very emotional and distracted during the phone call. RN tried to get her to come into [facility], but she wouldn't.  SHe will however keep her appointment with OBGYN 6/23. SHe ended the call when her [facility] Case manager Dorene Grebe was calling her. Will continue to try to keep in contact. Andree Coss, RN

## 2020-02-05 MED ORDER — DIPHENHYDRAMINE HCL 2 % EX CREA: TOPICAL_CREAM | Freq: Two times a day (BID) | CUTANEOUS | 1 refills | Status: DC | PRN

## 2020-02-05 NOTE — Addendum Note (Signed)
Addended by: Andree Coss on: 02/05/2020 02:10 PM   Modules accepted: Orders

## 2020-02-05 NOTE — Telephone Encounter (Signed)
Patient rescheduled for 6/30 so Mitch can bring her.  Marthann Schiller will coordinate with [facility] for her critical needs at this time. Sent benadryl cream to pharmacy. Andree Coss, RN

## 2020-02-13 ENCOUNTER — Encounter: Payer: Medicaid Other | Admitting: Advanced Practice Midwife

## 2020-02-14 ENCOUNTER — Encounter: Payer: Medicaid Other | Admitting: Family Medicine

## 2020-02-20 ENCOUNTER — Ambulatory Visit: Payer: Medicaid Other | Admitting: Internal Medicine

## 2020-02-21 ENCOUNTER — Encounter: Payer: Self-pay | Admitting: Internal Medicine

## 2020-02-21 ENCOUNTER — Ambulatory Visit (INDEPENDENT_AMBULATORY_CARE_PROVIDER_SITE_OTHER): Payer: Medicaid Other | Admitting: Internal Medicine

## 2020-02-21 ENCOUNTER — Other Ambulatory Visit: Payer: Self-pay

## 2020-02-21 VITALS — BP 112/68 | HR 97 | Temp 98.4°F | Wt 104.8 lb

## 2020-02-21 DIAGNOSIS — Z3493 Encounter for supervision of normal pregnancy, unspecified, third trimester: Secondary | ICD-10-CM | POA: Diagnosis not present

## 2020-02-21 DIAGNOSIS — F209 Schizophrenia, unspecified: Secondary | ICD-10-CM | POA: Diagnosis not present

## 2020-02-21 DIAGNOSIS — K0889 Other specified disorders of teeth and supporting structures: Secondary | ICD-10-CM

## 2020-02-21 DIAGNOSIS — B20 Human immunodeficiency virus [HIV] disease: Secondary | ICD-10-CM | POA: Diagnosis present

## 2020-02-21 NOTE — Progress Notes (Signed)
RFV: follow up for hiv disease,  Patient ID: Tamara Briggs, female   DOB: [birth year], 34 y.o.   MRN: [id number]  HPI Tamara Briggs is a 34yo F with hiv disease, bipolar disorder, pregnancy- 32 wks. -biktarvy-pifletro  She thinks she is 7 months pregnant. Has had 2 weeks of dental pain.  - only missing a couple doses  Outpatient Encounter Medications as of 02/21/2020  Medication Sig  . benztropine (COGENTIN) 0.5 MG tablet Take 0.5 mg by mouth daily as needed for tremors.   Marland Kitchen BIKTARVY 50-200-25 MG TABS tablet Take 1 tablet by mouth daily.  . doravirine (PIFELTRO) 100 MG TABS tablet Take 1 tablet (100 mg total) by mouth daily.  Marland Kitchen ENSURE (ENSURE) Take 237 mLs by mouth 2 (two) times daily between meals.  Marland Kitchen HYDROcodone-acetaminophen (NORCO/VICODIN) 5-325 MG tablet Take 1 tablet by mouth every 6 (six) hours as needed for moderate pain or severe pain.  . IBU 800 MG tablet Take 800 mg by mouth 2 (two) times daily as needed.  Marland Kitchen LATUDA 40 MG TABS tablet TAKE one tablet BY MOUTH EVERY EVENING with meals  . methocarbamol (ROBAXIN) 500 MG tablet Take 500 mg by mouth 2 (two) times daily as needed for muscle spasms.   . prenatal vitamin w/FE, FA (PRENATAL 1 + 1) 27-1 MG TABS tablet Take 1 tablet by mouth daily at 12 noon.  . sertraline (ZOLOFT) 50 MG tablet Take 1 tablet (50 mg total) by mouth daily.  Marland Kitchen acetaminophen (TYLENOL) 325 MG tablet Take 2 tablets (650 mg total) by mouth every 4 (four) hours as needed (for pain scale < 4). (Patient not taking: Reported on 02/21/2020)  . diphenhydrAMINE (BENADRYL) 2 % cream Apply topically 2 (two) times daily as needed for itching. (Patient not taking: Reported on 02/21/2020)  . hydrocortisone 2.5 % lotion Apply topically 2 (two) times daily. (Patient not taking: Reported on 02/21/2020)   No facility-administered encounter medications on file as of 02/21/2020.     Patient Active Problem List   Diagnosis Date Noted  . Supervision of high risk pregnancy, antepartum  10/31/2019  . Routine screening for STI (sexually transmitted infection) Jul 22, 2018  . Encounter for contraceptive management 2018/07/22  . Healthcare maintenance 22-Jul-2018  . Neonatal death Apr 01, 2018  . 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 History of gestational diabetes mellitus (GDM) in prior pregnancy, currently pregnant 01/19/2018  . Pre-diabetes 01/13/2018  . Protein-calorie malnutrition, severe (HCC) 11/24/2017  . Crack cocaine use   . Bipolar disorder (HCC)   . History of VBAC 08/31/2016  . 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 Tardive dyskinesia 06/13/2014  . History of loop electrical excision procedure (LEEP) 12/25/2013  . 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 Schizophrenia (HCC) 10/18/2013  . Human immunodeficiency virus (HIV) disease (HCC) 07/27/2011     Health Maintenance Due  Topic Date Due  . COVID-19 Vaccine (1) Never done  . PAP SMEAR-Modifier  07/08/2018     Review of Systems 12 point ros is negative some occ nausea Physical Exam   BP 112/68   Pulse 97   Temp 98.4 F (36.9 C)   Wt 104 lb 12.8 oz (47.5 kg)   LMP 06/30/2019   SpO2 100%   BMI 17.98 kg/m   Physical Exam  Constitutional:  oriented to person, place, and time. appears well-developed and well-nourished. No distress.  HENT: [place]/AT, PERRLA, no scleral icterus Mouth/Throat: Oropharynx is clear and moist. No oropharyngeal exudate.  Cardiovascular: Normal rate, regular rhythm and normal heart sounds.  Exam reveals no gallop and no friction rub.  No murmur heard.  Pulmonary/Chest: Effort normal and breath sounds normal. No respiratory distress.  has no wheezes.  Neck = supple, no nuchal rigidity Abdominal: Soft. Bowel sounds are normal.  exhibits no distension. There is no tenderness. +gravid abdomen Lymphadenopathy: no cervical adenopathy. No axillary adenopathy Neurological: alert and oriented to person, place, and time.  Skin: Skin is warm and dry. No rash noted. No erythema.    Psychiatric: a normal mood and affect.  behavior is normal.   Lab Results  Component Value Date   CD4TCELL 32 (L) 11/30/2019   Lab Results  Component Value Date   CD4TABS 331 (L) 11/30/2019   CD4TABS 620 02/20/2019   CD4TABS 310 (L) 09/19/2018   Lab Results  Component Value Date   HIV1RNAQUANT 32,400 (H) 11/30/2019   Lab Results  Component Value Date   HEPBSAB POS (A) 07/09/2011   Lab Results  Component Value Date   LABRPR NON-REACTIVE 11/30/2019    CBC Lab Results  Component Value Date   WBC 4.7 11/30/2019   RBC 3.33 (L) 11/30/2019   HGB 9.6 (L) 11/30/2019   HCT 28.7 (L) 11/30/2019   PLT 146 11/30/2019   MCV 86.2 11/30/2019   MCH 28.8 11/30/2019   MCHC 33.4 11/30/2019   RDW 13.6 11/30/2019   LYMPHSABS 1,053 11/30/2019   MONOABS 432 04/06/2017   EOSABS 9 (L) 11/30/2019    BMET Lab Results  Component Value Date   NA 135 11/30/2019   K 3.4 (L) 11/30/2019   CL 104 11/30/2019   CO2 27 11/30/2019   GLUCOSE 91 11/30/2019   BUN 15 11/30/2019   CREATININE 0.62 11/30/2019   CALCIUM 8.0 (L) 11/30/2019   GFRNONAA 118 11/30/2019   GFRAA 136 11/30/2019      Assessment and Plan hiv disease= continue on biktarvy and pfilletro. Will do labs to see that she is undetectable during her pregnancy especially as she approaches her due date  Depression = needs to also continue to meet her [facility] appt in order to be able to keep infant Dental caries= Received amoxicillin from dentistry, may need teeth extraction next week  3rd trimester pregnancy =Needs follow up OB appointment

## 2020-02-22 LAB — T-HELPER CELL (CD4) - (RCID CLINIC ONLY)
CD4 % Helper T Cell: 34 % (ref 33–65)
CD4 T Cell Abs: 428 /uL (ref 400–1790)

## 2020-02-24 LAB — CBC WITH DIFFERENTIAL/PLATELET
Absolute Monocytes: 435 cells/uL (ref 200–950)
Basophils Absolute: 13 cells/uL (ref 0–200)
Basophils Relative: 0.2 %
Eosinophils Absolute: 139 cells/uL (ref 15–500)
Eosinophils Relative: 2.2 %
HCT: 33 % — ABNORMAL LOW (ref 35.0–45.0)
Hemoglobin: 11.2 g/dL — ABNORMAL LOW (ref 11.7–15.5)
Lymphs Abs: 1241 cells/uL (ref 850–3900)
MCH: 29.1 pg (ref 27.0–33.0)
MCHC: 33.9 g/dL (ref 32.0–36.0)
MCV: 85.7 fL (ref 80.0–100.0)
MPV: 9.4 fL (ref 7.5–12.5)
Monocytes Relative: 6.9 %
Neutro Abs: 4473 cells/uL (ref 1500–7800)
Neutrophils Relative %: 71 %
Platelets: 135 10*3/uL — ABNORMAL LOW (ref 140–400)
RBC: 3.85 10*6/uL (ref 3.80–5.10)
RDW: 12.7 % (ref 11.0–15.0)
Total Lymphocyte: 19.7 %
WBC: 6.3 10*3/uL (ref 3.8–10.8)

## 2020-02-24 LAB — COMPLETE METABOLIC PANEL WITH GFR
AG Ratio: 1 (calc) (ref 1.0–2.5)
ALT: 28 U/L (ref 6–29)
AST: 26 U/L (ref 10–30)
Albumin: 3.6 g/dL (ref 3.6–5.1)
Alkaline phosphatase (APISO): 155 U/L — ABNORMAL HIGH (ref 31–125)
BUN: 14 mg/dL (ref 7–25)
CO2: 26 mmol/L (ref 20–32)
Calcium: 8.8 mg/dL (ref 8.6–10.2)
Chloride: 101 mmol/L (ref 98–110)
Creat: 0.62 mg/dL (ref 0.50–1.10)
GFR, Est African American: 135 mL/min/{1.73_m2} (ref >=60)
GFR, Est Non African American: 117 mL/min/{1.73_m2} (ref >=60)
Globulin: 3.6 g/dL (calc) (ref 1.9–3.7)
Glucose, Bld: 140 mg/dL — ABNORMAL HIGH (ref 65–99)
Potassium: 3.9 mmol/L (ref 3.5–5.3)
Sodium: 135 mmol/L (ref 135–146)
Total Bilirubin: 0.2 mg/dL (ref 0.2–1.2)
Total Protein: 7.2 g/dL (ref 6.1–8.1)

## 2020-02-24 LAB — RPR: RPR Ser Ql: NONREACTIVE

## 2020-02-24 LAB — HIV-1 RNA QUANT-NO REFLEX-BLD
HIV 1 RNA Quant: <20 copies/mL — AB
HIV-1 RNA Quant, Log: <1.3 Log copies/mL — AB

## 2020-02-29 ENCOUNTER — Ambulatory Visit (INDEPENDENT_AMBULATORY_CARE_PROVIDER_SITE_OTHER): Payer: Medicaid Other | Admitting: Family Medicine

## 2020-02-29 ENCOUNTER — Other Ambulatory Visit: Payer: Self-pay

## 2020-02-29 ENCOUNTER — Encounter: Payer: Self-pay | Admitting: Family Medicine

## 2020-02-29 VITALS — BP 104/69 | HR 75 | Wt 104.0 lb

## 2020-02-29 DIAGNOSIS — O0933 Supervision of pregnancy with insufficient antenatal care, third trimester: Secondary | ICD-10-CM | POA: Diagnosis not present

## 2020-02-29 DIAGNOSIS — O98713 Human immunodeficiency virus [HIV] disease complicating pregnancy, third trimester: Secondary | ICD-10-CM

## 2020-02-29 DIAGNOSIS — O09523 Supervision of elderly multigravida, third trimester: Secondary | ICD-10-CM

## 2020-02-29 DIAGNOSIS — Z23 Encounter for immunization: Secondary | ICD-10-CM

## 2020-02-29 DIAGNOSIS — F319 Bipolar disorder, unspecified: Secondary | ICD-10-CM

## 2020-02-29 DIAGNOSIS — O0993 Supervision of high risk pregnancy, unspecified, third trimester: Secondary | ICD-10-CM | POA: Diagnosis not present

## 2020-02-29 DIAGNOSIS — O34219 Maternal care for unspecified type scar from previous cesarean delivery: Secondary | ICD-10-CM

## 2020-02-29 DIAGNOSIS — Z21 Asymptomatic human immunodeficiency virus [HIV] infection status: Secondary | ICD-10-CM

## 2020-02-29 DIAGNOSIS — O98719 Human immunodeficiency virus [HIV] disease complicating pregnancy, unspecified trimester: Secondary | ICD-10-CM

## 2020-02-29 DIAGNOSIS — Z98891 History of uterine scar from previous surgery: Secondary | ICD-10-CM

## 2020-02-29 DIAGNOSIS — O99343 Other mental disorders complicating pregnancy, third trimester: Secondary | ICD-10-CM

## 2020-02-29 DIAGNOSIS — Z3A33 33 weeks gestation of pregnancy: Secondary | ICD-10-CM

## 2020-02-29 DIAGNOSIS — O0990 Supervision of high risk pregnancy, unspecified, unspecified trimester: Secondary | ICD-10-CM

## 2020-02-29 NOTE — Progress Notes (Signed)
Subjective:  Tamara Briggs is a [id number] [redacted]w[redacted]d being seen today for her first obstetrical visit.  Her obstetrical history is significant for HIV positive status with undetectible viral load, AMA, prior cesarean delivery followed by vaginal delivery, late to prenatal care. Cesarean section was done for FGR and NRFHT. This baby passed away within a few hours of birth. She reports cause of death to be "too much stress" on the baby. Patient does not intend to breast feed. Pregnancy history fully reviewed.  Patient reports no complaints.  BP 104/69   Pulse 75   Wt 104 lb (47.2 kg)   LMP 06/30/2019   BMI 17.84 kg/m   HISTORY: OB History  Gravida Para Term Preterm AB Living  6 4 1 3 1 3   SAB TAB Ectopic Multiple Live Births  1 0 0 0 4    # Outcome Date GA Lbr Len/2nd Weight Sex Delivery Anes PTL Lv  6 Current           5 Preterm 05/2019    M Vag-Spont None N LIV  4 Preterm 10/23/16 [redacted]w[redacted]d 17:58 / 02:10 4 lb 8.5 oz (2.055 kg) M Vag-Spont None  LIV  3 Preterm 12/24/13 [redacted]w[redacted]d  2 lb 14.9 oz (1.33 kg) F CS-LTranv Spinal  ND  2 Term 04/10/08   5 lb 14 oz (2.665 kg) F Vag-Spont None N LIV     Birth Comments: "delivery 2 weeks early at 8 months"  1 SAB             Past Medical History:  Diagnosis Date  . Anxiety   . Bipolar disorder (HCC)   . Crack cocaine use   . Depression   . h/o crack Cocaine abuse 03/15/2013   UDS positive cocaine  . 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  AFP increased and sent to genetics.  Amnio declined.  11/18/2013 nml.   [place] HIV test positive (HCC)   . Infection   . Otitis media of left ear 03/15/2013  . Schizophrenia (HCC)   . 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 Tardive dyskinesia 06/13/2014  . Tobacco abuse 03/15/2013    Past Surgical History:  Procedure Laterality Date  . CESAREAN SECTION N/A 12/24/2013   Procedure: CESAREAN SECTION;  Surgeon: 02/23/2014, MD;  Location: [facility];  Service:  Obstetrics;  Laterality: N/A;  . COLPOSCOPY  2009  . DENTAL RESTORATION/EXTRACTION WITH X-RAY      Family History  Problem Relation Age of Onset  . Diabetes Mother   . High blood pressure Mother   . Cancer Father      Exam  BP 104/69   Pulse 75   Wt 104 lb (47.2 kg)   LMP 06/30/2019   BMI 17.84 kg/m   Chaperone present during exam  CONSTITUTIONAL: Well-developed, well-nourished female in no acute distress.  HENT:  Normocephalic, atraumatic, External right and left ear normal. Oropharynx is clear and moist EYES: Conjunctivae and EOM are normal. Pupils are equal, round, and reactive to light. No scleral icterus.  NECK: Normal range of motion, supple, no masses.  Normal thyroid.  CARDIOVASCULAR: Normal heart rate noted, regular rhythm RESPIRATORY: Clear to auscultation bilaterally. Effort and breath sounds normal, no problems with respiration noted. ABDOMEN: Soft, normal bowel sounds, no distention noted.  No tenderness, rebound or guarding.  MUSCULOSKELETAL: Normal range of motion. No tenderness.  No cyanosis, clubbing, or edema.  2+ distal pulses. SKIN:  Skin is warm and dry. No rash noted. Not diaphoretic. No erythema. No pallor. NEUROLOGIC: Alert and oriented to person, place, and time. Normal reflexes, muscle tone coordination. No cranial nerve deficit noted. PSYCHIATRIC: Normal mood and affect. Normal behavior. Normal judgment and thought content.    Assessment:    Pregnancy: [id number] Patient Active Problem List   Diagnosis Date Noted  . Supervision of high risk pregnancy, antepartum 10/31/2019  . Routine screening for STI (sexually transmitted infection) 07/20/2018  . Encounter for contraceptive management 07-20-2018  . Healthcare maintenance 07-20-18  . Neonatal death 2018-03-30  . 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 History of gestational diabetes mellitus (GDM) in prior pregnancy, currently pregnant 01/19/2018  . Pre-diabetes 01/13/2018  . Protein-calorie  malnutrition, severe (HCC) 11/24/2017  . Crack cocaine use   . Bipolar disorder (HCC)   . History of VBAC 08/31/2016  . 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 Tardive dyskinesia 06/13/2014  . History of loop electrical excision procedure (LEEP) 12/25/2013  . 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 Schizophrenia (HCC) 10/18/2013  . Human immunodeficiency virus (HIV) disease (HCC) 07/27/2011      Plan:   1. Supervision of high risk pregnancy, antepartum FHT and FH normal - Culture, OB Urine - Tdap vaccine greater than or equal to 7yo IM - CBC/D/Plt+RPR+Rh+ABO+Rub Ab... - Genetic Screening - US MFM OB DETAIL +14 WK; Future  2. History of VBAC Desires another VBAC  3. HIV affecting pregnancy, antepartum Viral load undetectible. Compliant with medications. Continue ID care. Serial growth [place] BPP weekly  4. Bipolar affective disorder, remission status unspecified (HCC)  5. Previous cesarean section complicating pregnancy  6.  AMA Start ASA 81mg   7. Late to prenatal care      Problem list reviewed and updated. 75% of 30 min visit spent on counseling and coordination of care.     03/01/2020

## 2020-03-01 ENCOUNTER — Encounter: Payer: Self-pay | Admitting: Family Medicine

## 2020-03-01 DIAGNOSIS — O34219 Maternal care for unspecified type scar from previous cesarean delivery: Secondary | ICD-10-CM | POA: Insufficient documentation

## 2020-03-01 DIAGNOSIS — O09523 Supervision of elderly multigravida, third trimester: Secondary | ICD-10-CM | POA: Insufficient documentation

## 2020-03-01 LAB — CBC/D/PLT+RPR+RH+ABO+RUB AB...
Antibody Screen: NEGATIVE
Basophils Absolute: 0 10*3/uL (ref 0.0–0.2)
Basos: 0 %
EOS (ABSOLUTE): 0.1 10*3/uL (ref 0.0–0.4)
Eos: 1 %
HCV Ab: <0.1 s/co ratio (ref 0.0–0.9)
HIV Screen 4th Generation wRfx: REACTIVE — AB
Hematocrit: 33.8 % — ABNORMAL LOW (ref 34.0–46.6)
Hemoglobin: 11.5 g/dL (ref 11.1–15.9)
Hepatitis B Surface Ag: NEGATIVE
Immature Grans (Abs): 0 10*3/uL (ref 0.0–0.1)
Immature Granulocytes: 1 %
Lymphocytes Absolute: 1.4 10*3/uL (ref 0.7–3.1)
Lymphs: 30 %
MCH: 29.9 pg (ref 26.6–33.0)
MCHC: 34 g/dL (ref 31.5–35.7)
MCV: 88 fL (ref 79–97)
Monocytes Absolute: 0.3 10*3/uL (ref 0.1–0.9)
Monocytes: 6 %
Neutrophils Absolute: 2.9 10*3/uL (ref 1.4–7.0)
Neutrophils: 62 %
Platelets: 146 10*3/uL — ABNORMAL LOW (ref 150–450)
RBC: 3.84 x10E6/uL (ref 3.77–5.28)
RDW: 13.1 % (ref 11.7–15.4)
RPR Ser Ql: NONREACTIVE
Rh Factor: POSITIVE
Rubella Antibodies, IGG: 5.47 index (ref >0.99)
WBC: 4.7 10*3/uL (ref 3.4–10.8)

## 2020-03-01 LAB — HIV 1/2 AB DIFFERENTIATION
HIV 1 Ab: POSITIVE — AB
HIV 2 Ab: NEGATIVE
NOTE (HIV CONF MULTIP: POSITIVE — AB

## 2020-03-01 LAB — HCV INTERPRETATION

## 2020-03-02 LAB — URINE CULTURE, OB REFLEX: Organism ID, Bacteria: NO GROWTH

## 2020-03-02 LAB — CULTURE, OB URINE

## 2020-03-06 ENCOUNTER — Other Ambulatory Visit: Payer: Self-pay

## 2020-03-06 ENCOUNTER — Ambulatory Visit: Payer: Medicaid Other | Admitting: *Deleted

## 2020-03-06 DIAGNOSIS — K0889 Other specified disorders of teeth and supporting structures: Secondary | ICD-10-CM

## 2020-03-06 MED ORDER — ACETAMINOPHEN 325 MG PO TABS
650.0000 mg | ORAL_TABLET | Freq: Once | ORAL | Status: AC
  Administered 2020-03-06: 650 mg via ORAL

## 2020-03-06 NOTE — Progress Notes (Signed)
Patient in clinic for dental clinic appointment to remove lower right wisdom tooth. Patient was agitated and upset during extraction. RN brought patient back to clinic so she could rest and recover.  Patient fell asleep.  RN monitored patient. She had some pain and some slight bloody drainage.  Shanda Bumps, assistant from Dental clinic, assessed bleeding and found it to be appropriate, not of concern. RN obtained verbal order for 650 mg tylenol once per Dr Drue Second.  RN woke patient up to give her the medication and have her drink water. She had no issues swallowing the medication or water. RN gave her an ice pack as well to reduce the swelling. Patient fell back asleep. Dwain Sarna, patient's bridge counselor, will bring patient home this afternoon. Andree Coss, RN

## 2020-03-07 ENCOUNTER — Other Ambulatory Visit: Payer: Medicaid Other

## 2020-03-07 DIAGNOSIS — O09899 Supervision of other high risk pregnancies, unspecified trimester: Secondary | ICD-10-CM

## 2020-03-07 NOTE — Progress Notes (Signed)
Pt presents for 2 hr GTT. Pt was given lab slip and sent to the lab.  Jalise Zawistowski l Sumaya Riedesel, CMA 

## 2020-03-07 NOTE — Progress Notes (Signed)
Chart reviewed - agree with CMA/RN documentation.  ° °

## 2020-03-08 LAB — GLUCOSE TOLERANCE, 2 HOURS W/ 1HR
Glucose, 1 hour: 126 mg/dL (ref 65–179)
Glucose, 2 hour: 101 mg/dL (ref 65–152)
Glucose, Fasting: 70 mg/dL (ref 65–91)

## 2020-03-12 ENCOUNTER — Ambulatory Visit: Payer: Medicaid Other | Admitting: *Deleted

## 2020-03-12 ENCOUNTER — Other Ambulatory Visit: Payer: Self-pay

## 2020-03-12 ENCOUNTER — Ambulatory Visit: Payer: Medicaid Other | Attending: Obstetrics and Gynecology

## 2020-03-12 ENCOUNTER — Other Ambulatory Visit: Payer: Self-pay | Admitting: Family Medicine

## 2020-03-12 ENCOUNTER — Other Ambulatory Visit: Payer: Self-pay | Admitting: *Deleted

## 2020-03-12 VITALS — BP 95/63 | HR 68

## 2020-03-12 DIAGNOSIS — O0990 Supervision of high risk pregnancy, unspecified, unspecified trimester: Secondary | ICD-10-CM

## 2020-03-12 DIAGNOSIS — O365990 Maternal care for other known or suspected poor fetal growth, unspecified trimester, not applicable or unspecified: Secondary | ICD-10-CM

## 2020-03-12 DIAGNOSIS — Z363 Encounter for antenatal screening for malformations: Secondary | ICD-10-CM

## 2020-03-12 DIAGNOSIS — O09293 Supervision of pregnancy with other poor reproductive or obstetric history, third trimester: Secondary | ICD-10-CM | POA: Diagnosis not present

## 2020-03-12 DIAGNOSIS — O09523 Supervision of elderly multigravida, third trimester: Secondary | ICD-10-CM | POA: Diagnosis not present

## 2020-03-12 DIAGNOSIS — Z3A35 35 weeks gestation of pregnancy: Secondary | ICD-10-CM

## 2020-03-12 DIAGNOSIS — O34219 Maternal care for unspecified type scar from previous cesarean delivery: Secondary | ICD-10-CM

## 2020-03-12 DIAGNOSIS — O98713 Human immunodeficiency virus [HIV] disease complicating pregnancy, third trimester: Secondary | ICD-10-CM

## 2020-03-14 ENCOUNTER — Encounter: Payer: Self-pay | Admitting: Family Medicine

## 2020-03-15 ENCOUNTER — Other Ambulatory Visit: Payer: Self-pay | Admitting: Family Medicine

## 2020-03-15 DIAGNOSIS — Z148 Genetic carrier of other disease: Secondary | ICD-10-CM | POA: Insufficient documentation

## 2020-03-15 DIAGNOSIS — D563 Thalassemia minor: Secondary | ICD-10-CM

## 2020-03-18 ENCOUNTER — Encounter: Payer: Medicaid Other | Admitting: Obstetrics & Gynecology

## 2020-03-20 ENCOUNTER — Ambulatory Visit: Payer: Medicaid Other | Admitting: Internal Medicine

## 2020-03-22 ENCOUNTER — Ambulatory Visit: Payer: Medicaid Other | Attending: Obstetrics and Gynecology

## 2020-03-22 ENCOUNTER — Ambulatory Visit: Payer: Medicaid Other | Admitting: *Deleted

## 2020-03-22 ENCOUNTER — Encounter: Payer: Self-pay | Admitting: *Deleted

## 2020-03-22 ENCOUNTER — Other Ambulatory Visit: Payer: Self-pay

## 2020-03-22 VITALS — BP 124/70 | HR 87

## 2020-03-22 DIAGNOSIS — O09523 Supervision of elderly multigravida, third trimester: Secondary | ICD-10-CM

## 2020-03-22 DIAGNOSIS — Z8759 Personal history of other complications of pregnancy, childbirth and the puerperium: Secondary | ICD-10-CM

## 2020-03-22 DIAGNOSIS — O98719 Human immunodeficiency virus [HIV] disease complicating pregnancy, unspecified trimester: Secondary | ICD-10-CM | POA: Insufficient documentation

## 2020-03-22 DIAGNOSIS — O365990 Maternal care for other known or suspected poor fetal growth, unspecified trimester, not applicable or unspecified: Secondary | ICD-10-CM | POA: Diagnosis not present

## 2020-03-22 DIAGNOSIS — O34219 Maternal care for unspecified type scar from previous cesarean delivery: Secondary | ICD-10-CM

## 2020-03-22 DIAGNOSIS — O0933 Supervision of pregnancy with insufficient antenatal care, third trimester: Secondary | ICD-10-CM

## 2020-03-22 DIAGNOSIS — O98713 Human immunodeficiency virus [HIV] disease complicating pregnancy, third trimester: Secondary | ICD-10-CM

## 2020-03-22 DIAGNOSIS — O09293 Supervision of pregnancy with other poor reproductive or obstetric history, third trimester: Secondary | ICD-10-CM | POA: Diagnosis not present

## 2020-03-22 DIAGNOSIS — O365930 Maternal care for other known or suspected poor fetal growth, third trimester, not applicable or unspecified: Secondary | ICD-10-CM | POA: Diagnosis not present

## 2020-03-22 DIAGNOSIS — Z3A36 36 weeks gestation of pregnancy: Secondary | ICD-10-CM

## 2020-03-22 DIAGNOSIS — B20 Human immunodeficiency virus [HIV] disease: Secondary | ICD-10-CM

## 2020-03-25 ENCOUNTER — Other Ambulatory Visit: Payer: Self-pay

## 2020-03-25 ENCOUNTER — Inpatient Hospital Stay (HOSPITAL_COMMUNITY)
Admission: AD | Admit: 2020-03-25 | Discharge: 2020-03-28 | DRG: 806 | Disposition: A | Discharge status: Home / Self Care | Payer: Medicaid Other | Attending: Family Medicine | Admitting: Family Medicine

## 2020-03-25 ENCOUNTER — Ambulatory Visit: Payer: Medicaid Other | Attending: Obstetrics & Gynecology

## 2020-03-25 ENCOUNTER — Encounter (HOSPITAL_COMMUNITY): Payer: Self-pay | Admitting: Family Medicine

## 2020-03-25 DIAGNOSIS — F1490 Cocaine use, unspecified, uncomplicated: Secondary | ICD-10-CM | POA: Diagnosis present

## 2020-03-25 DIAGNOSIS — F319 Bipolar disorder, unspecified: Secondary | ICD-10-CM | POA: Diagnosis present

## 2020-03-25 DIAGNOSIS — O9872 Human immunodeficiency virus [HIV] disease complicating childbirth: Secondary | ICD-10-CM | POA: Diagnosis present

## 2020-03-25 DIAGNOSIS — O99324 Drug use complicating childbirth: Secondary | ICD-10-CM | POA: Diagnosis present

## 2020-03-25 DIAGNOSIS — O99334 Smoking (tobacco) complicating childbirth: Secondary | ICD-10-CM | POA: Diagnosis present

## 2020-03-25 DIAGNOSIS — D563 Thalassemia minor: Secondary | ICD-10-CM | POA: Diagnosis present

## 2020-03-25 DIAGNOSIS — Z21 Asymptomatic human immunodeficiency virus [HIV] infection status: Secondary | ICD-10-CM | POA: Diagnosis present

## 2020-03-25 DIAGNOSIS — Z20822 Contact with and (suspected) exposure to covid-19: Secondary | ICD-10-CM | POA: Diagnosis present

## 2020-03-25 DIAGNOSIS — Z9889 Other specified postprocedural states: Secondary | ICD-10-CM

## 2020-03-25 DIAGNOSIS — F209 Schizophrenia, unspecified: Secondary | ICD-10-CM | POA: Diagnosis present

## 2020-03-25 DIAGNOSIS — O99344 Other mental disorders complicating childbirth: Secondary | ICD-10-CM | POA: Diagnosis present

## 2020-03-25 DIAGNOSIS — Z87728 Personal history of other specified (corrected) congenital malformations of nervous system and sense organs: Secondary | ICD-10-CM

## 2020-03-25 DIAGNOSIS — O34211 Maternal care for low transverse scar from previous cesarean delivery: Secondary | ICD-10-CM | POA: Diagnosis present

## 2020-03-25 DIAGNOSIS — Z98891 History of uterine scar from previous surgery: Secondary | ICD-10-CM

## 2020-03-25 DIAGNOSIS — R262 Difficulty in walking, not elsewhere classified: Secondary | ICD-10-CM | POA: Diagnosis present

## 2020-03-25 DIAGNOSIS — Z3A37 37 weeks gestation of pregnancy: Secondary | ICD-10-CM

## 2020-03-25 DIAGNOSIS — O26893 Other specified pregnancy related conditions, third trimester: Secondary | ICD-10-CM | POA: Diagnosis present

## 2020-03-25 DIAGNOSIS — B20 Human immunodeficiency virus [HIV] disease: Secondary | ICD-10-CM | POA: Diagnosis present

## 2020-03-25 DIAGNOSIS — F17210 Nicotine dependence, cigarettes, uncomplicated: Secondary | ICD-10-CM | POA: Diagnosis present

## 2020-03-25 DIAGNOSIS — G2119 Other drug induced secondary parkinsonism: Secondary | ICD-10-CM

## 2020-03-25 DIAGNOSIS — O98719 Human immunodeficiency virus [HIV] disease complicating pregnancy, unspecified trimester: Secondary | ICD-10-CM | POA: Diagnosis present

## 2020-03-25 DIAGNOSIS — G2401 Drug induced subacute dyskinesia: Secondary | ICD-10-CM | POA: Diagnosis present

## 2020-03-25 DIAGNOSIS — O365990 Maternal care for other known or suspected poor fetal growth, unspecified trimester, not applicable or unspecified: Secondary | ICD-10-CM | POA: Diagnosis present

## 2020-03-25 DIAGNOSIS — Z148 Genetic carrier of other disease: Secondary | ICD-10-CM

## 2020-03-25 DIAGNOSIS — F1410 Cocaine abuse, uncomplicated: Secondary | ICD-10-CM

## 2020-03-25 DIAGNOSIS — G219 Secondary parkinsonism, unspecified: Secondary | ICD-10-CM | POA: Diagnosis present

## 2020-03-25 DIAGNOSIS — O365930 Maternal care for other known or suspected poor fetal growth, third trimester, not applicable or unspecified: Secondary | ICD-10-CM | POA: Diagnosis present

## 2020-03-25 DIAGNOSIS — O34219 Maternal care for unspecified type scar from previous cesarean delivery: Secondary | ICD-10-CM | POA: Diagnosis present

## 2020-03-25 DIAGNOSIS — O09523 Supervision of elderly multigravida, third trimester: Secondary | ICD-10-CM | POA: Diagnosis present

## 2020-03-25 DIAGNOSIS — O0933 Supervision of pregnancy with insufficient antenatal care, third trimester: Secondary | ICD-10-CM

## 2020-03-25 DIAGNOSIS — O4202 Full-term premature rupture of membranes, onset of labor within 24 hours of rupture: Secondary | ICD-10-CM

## 2020-03-25 DIAGNOSIS — O09299 Supervision of pregnancy with other poor reproductive or obstetric history, unspecified trimester: Secondary | ICD-10-CM

## 2020-03-25 LAB — SARS CORONAVIRUS 2 BY RT PCR (HOSPITAL ORDER, PERFORMED IN ~~LOC~~ HOSPITAL LAB): SARS Coronavirus 2: NEGATIVE

## 2020-03-25 LAB — POCT FERN TEST: POCT Fern Test: POSITIVE

## 2020-03-25 MED ORDER — DIBUCAINE (PERIANAL) 1 % EX OINT: 1.0000 "application " | TOPICAL_OINTMENT | CUTANEOUS | Status: DC | PRN

## 2020-03-25 MED ORDER — BENZOCAINE-MENTHOL 20-0.5 % EX AERO: 1.0000 "application " | INHALATION_SPRAY | CUTANEOUS | Status: DC | PRN

## 2020-03-25 MED ORDER — LACTATED RINGERS IV SOLN: INTRAVENOUS | Status: DC

## 2020-03-25 MED ORDER — LIDOCAINE HCL (PF) 1 % IJ SOLN: 30.0000 mL | INTRAMUSCULAR | Status: DC | PRN

## 2020-03-25 MED ORDER — SIMETHICONE 80 MG PO CHEW: 80.0000 mg | CHEWABLE_TABLET | ORAL | Status: DC | PRN

## 2020-03-25 MED ORDER — PRENATAL MULTIVITAMIN CH
1.0000 | ORAL_TABLET | Freq: Every day | ORAL | Status: DC
  Administered 2020-03-26 – 2020-03-28 (×3): 1 via ORAL
  Filled 2020-03-25 (×3): qty 1

## 2020-03-25 MED ORDER — LACTATED RINGERS IV SOLN: 500.0000 mL | INTRAVENOUS | Status: DC | PRN

## 2020-03-25 MED ORDER — SENNOSIDES-DOCUSATE SODIUM 8.6-50 MG PO TABS
2.0000 | ORAL_TABLET | ORAL | Status: DC
  Administered 2020-03-26 (×2): 2 via ORAL
  Filled 2020-03-25 (×3): qty 2

## 2020-03-25 MED ORDER — OXYTOCIN 10 UNIT/ML IJ SOLN: 10.0000 [IU] | Freq: Once | INTRAMUSCULAR | Status: DC

## 2020-03-25 MED ORDER — LURASIDONE HCL 40 MG PO TABS
40.0000 mg | ORAL_TABLET | Freq: Every day | ORAL | Status: DC
  Filled 2020-03-25 (×4): qty 1

## 2020-03-25 MED ORDER — ACETAMINOPHEN 325 MG PO TABS
650.0000 mg | ORAL_TABLET | ORAL | Status: DC | PRN
  Administered 2020-03-25 – 2020-03-28 (×9): 650 mg via ORAL
  Filled 2020-03-25 (×9): qty 2

## 2020-03-25 MED ORDER — OXYTOCIN-SODIUM CHLORIDE 30-0.9 UT/500ML-% IV SOLN: 2.5000 [IU]/h | INTRAVENOUS | Status: DC

## 2020-03-25 MED ORDER — ONDANSETRON HCL 4 MG/2ML IJ SOLN: 4.0000 mg | Freq: Four times a day (QID) | INTRAMUSCULAR | Status: DC | PRN

## 2020-03-25 MED ORDER — ONDANSETRON HCL 4 MG/2ML IJ SOLN: 4.0000 mg | INTRAMUSCULAR | Status: DC | PRN

## 2020-03-25 MED ORDER — ZOLPIDEM TARTRATE 5 MG PO TABS: 5.0000 mg | ORAL_TABLET | Freq: Every evening | ORAL | Status: DC | PRN

## 2020-03-25 MED ORDER — OXYTOCIN 10 UNIT/ML IJ SOLN
INTRAMUSCULAR | Status: AC
  Filled 2020-03-25: qty 1

## 2020-03-25 MED ORDER — ONDANSETRON HCL 4 MG PO TABS: 4.0000 mg | ORAL_TABLET | ORAL | Status: DC | PRN

## 2020-03-25 MED ORDER — OXYTOCIN BOLUS FROM INFUSION: 333.0000 mL | Freq: Once | INTRAVENOUS | Status: DC

## 2020-03-25 MED ORDER — ZIDOVUDINE 10 MG/ML IV SOLN
2.0000 mg/kg | Freq: Once | INTRAVENOUS | Status: DC
  Filled 2020-03-25: qty 10

## 2020-03-25 MED ORDER — WITCH HAZEL-GLYCERIN EX PADS: 1.0000 "application " | MEDICATED_PAD | CUTANEOUS | Status: DC | PRN

## 2020-03-25 MED ORDER — SOD CITRATE-CITRIC ACID 500-334 MG/5ML PO SOLN: 30.0000 mL | ORAL | Status: DC | PRN

## 2020-03-25 MED ORDER — SERTRALINE HCL 50 MG PO TABS
50.0000 mg | ORAL_TABLET | Freq: Every day | ORAL | Status: DC
  Administered 2020-03-26 – 2020-03-28 (×3): 50 mg via ORAL
  Filled 2020-03-25 (×4): qty 1

## 2020-03-25 MED ORDER — DORAVIRINE 100 MG PO TABS
100.0000 mg | ORAL_TABLET | Freq: Every day | ORAL | Status: DC
  Administered 2020-03-25 – 2020-03-27 (×3): 100 mg via ORAL
  Filled 2020-03-25 (×6): qty 1

## 2020-03-25 MED ORDER — BICTEGRAVIR-EMTRICITAB-TENOFOV 50-200-25 MG PO TABS
1.0000 | ORAL_TABLET | Freq: Every day | ORAL | Status: DC
  Administered 2020-03-25 – 2020-03-27 (×3): 1 via ORAL
  Filled 2020-03-25 (×5): qty 1

## 2020-03-25 MED ORDER — ZIDOVUDINE 10 MG/ML IV SOLN
1.0000 mg/kg/h | INTRAVENOUS | Status: DC
  Filled 2020-03-25: qty 40

## 2020-03-25 MED ORDER — DIPHENHYDRAMINE HCL 25 MG PO CAPS: 25.0000 mg | ORAL_CAPSULE | Freq: Four times a day (QID) | ORAL | Status: DC | PRN

## 2020-03-25 MED ORDER — ACETAMINOPHEN 325 MG PO TABS: 650.0000 mg | ORAL_TABLET | ORAL | Status: DC | PRN

## 2020-03-25 MED ORDER — TETANUS-DIPHTH-ACELL PERTUSSIS 5-2.5-18.5 LF-MCG/0.5 IM SUSP: 0.5000 mL | Freq: Once | INTRAMUSCULAR | Status: DC

## 2020-03-25 MED ORDER — COCONUT OIL OIL: 1.0000 "application " | TOPICAL_OIL | Status: DC | PRN

## 2020-03-25 MED ORDER — BENZTROPINE MESYLATE 0.5 MG PO TABS
0.5000 mg | ORAL_TABLET | Freq: Every day | ORAL | Status: DC | PRN
  Filled 2020-03-25: qty 1

## 2020-03-25 NOTE — MAU Note (Signed)
[place] Reports that water broke, unsure of the time.  Reports CTX that are not painful, just tightening.  Pt uncooperative with answering questions upon arrival to MAU.  Answering "I'm cold." to all questions.

## 2020-03-25 NOTE — Progress Notes (Signed)
Pt has refused to take scheduled medications during this RN shift. RN has attempted many time, as well as try to start an IV for dose of scheduled IV medication. Pt refusing. Will try again later and alert charge nurse and oncoming night shift nurse.

## 2020-03-25 NOTE — MAU Note (Signed)
Pt very uncooperative with staff.  Refusing cervical exam and bedside US at first.  Did end up allowing for cervical exam which showed pt was 10cm.  Pt moved to labor and delivery.  Refused IV/labs, refused covid test.  Dr Salomon Mast accompanied pt to room 203 on L&D.  Report given at bedside.

## 2020-03-25 NOTE — Progress Notes (Signed)
Patient refused fundal check on admission to Mother Baby Unit. Bleeding noted to be small. Patient in fetal position, muscles tense, rocking and moaning. Warm blankets and yellow socks placed on patient.

## 2020-03-25 NOTE — H&P (Addendum)
OBSTETRIC ADMISSION HISTORY AND PHYSICAL  Tamara Briggs is a 34 y.o. female [phone] with IUP at [redacted]w[redacted]d presenting for SOL/SROM at unknown time. She reports +FMs. No LOF, VB, blurry vision, headaches, peripheral edema, or RUQ pain. She plans on bottle feeding. She declines birth control.  Dating: By LMP --->  Estimated Date of Delivery: 04/15/20  Sono:   @[redacted]w[redacted]d , complete anatomy , cephalic presentation, 2028g, 12-28-1998, EFW 4#8  Prenatal History/Complications: HIV positive Late prenatal care H/o LEEP Cocaine use Schizophrenia H/o Cesarean section H/o VBAC x2 H/o GDM in prior pregnancy H/o neonatal demise (29 weeks, delivered via LTCS) IUGR 3.6% Advanced maternal age  Past Medical History: Past Medical History:  Diagnosis Date  . Anxiety   . Bipolar disorder (HCC)   . Crack cocaine use   . Depression   . h/o crack Cocaine abuse 03/15/2013   UDS positive cocaine  . 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  AFP increased and sent to genetics.  Amnio declined.  11/18/2013 nml.   [place] HIV test positive (HCC)   . Infection   . Otitis media of left ear 03/15/2013  . Schizophrenia (HCC)   . 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 Tardive dyskinesia 06/13/2014  . Tobacco abuse 03/15/2013    Past Surgical History: Past Surgical History:  Procedure Laterality Date  . CESAREAN SECTION N/A 12/24/2013   Procedure: CESAREAN SECTION;  Surgeon: 02/23/2014, MD;  Location: [facility];  Service: Obstetrics;  Laterality: N/A;  . COLPOSCOPY  2009  . DENTAL RESTORATION/EXTRACTION WITH X-RAY      Obstetrical History: OB History    Gravida  6   Para  4   Term  1   Preterm  3   AB  1   Living  3     SAB  1   TAB  0   Ectopic  0   Multiple  0   Live Births  4           Social History: Social History   Socioeconomic History  . Marital status: Single    Spouse name: Not on file  . Number of children: 1  . Years of  education: 11th  . Highest education level: Not on file  Occupational History  . Not on file  Tobacco Use  . Smoking status: Current Every Day Smoker    Packs/day: 0.30    Years: 3.00    Pack years: 0.90    Types: Cigarettes  . Smokeless tobacco: Never Used  . Tobacco comment: cutting back, 3 cigs/day  Vaping Use  . Vaping Use: Never used  Substance and Sexual Activity  . Alcohol use: No    Alcohol/week: 0.0 standard drinks  . Drug use: Not Currently    Frequency: 1.0 times per week    Types: Cocaine, Marijuana    Comment: cocaine 5/11, mj 2 weeks ago  . Sexual activity: Yes    Partners: Male    Birth control/protection: Injection    Comment: givne condoms  Other Topics Concern  . Not on file  Social History Narrative   Patient lives at home with her partner 7/11.   Unemployed.   Education 11 th grade.   Right handed.   Patient does not drink caffeine.   Social Determinants of Health   Financial Resource Strain:   . Difficulty of Paying Living Expenses:   Food Insecurity:   . Worried  About Running Out of Food in the Last Year:   . Ran Out of Food in the Last Year:   Transportation Needs:   . Lack of Transportation (Medical):   Marland Kitchen Lack of Transportation (Non-Medical):   Physical Activity:   . Days of Exercise per Week:   . Minutes of Exercise per Session:   Stress:   . Feeling of Stress :   Social Connections:   . Frequency of Communication with Friends and Family:   . Frequency of Social Gatherings with Friends and Family:   . Attends Religious Services:   . Active Member of Clubs or Organizations:   . Attends Banker Meetings:   Marland Kitchen Marital Status:     Family History: Family History  Problem Relation Age of Onset  . Diabetes Mother   . High blood pressure Mother   . Cancer Father     Allergies: No Known Allergies  Medications Prior to Admission  Medication Sig Dispense Refill Last Dose  . acetaminophen (TYLENOL) 325 MG tablet  Take 2 tablets (650 mg total) by mouth every 4 (four) hours as needed (for pain scale < 4). 30 tablet 0   . benztropine (COGENTIN) 0.5 MG tablet Take 0.5 mg by mouth daily as needed for tremors.   2   . BIKTARVY 50-200-25 MG TABS tablet Take 1 tablet by mouth daily. 30 tablet 0   . diphenhydrAMINE (BENADRYL) 2 % cream Apply topically 2 (two) times daily as needed for itching. (Patient not taking: Reported on 02/21/2020) 30 g 1   . doravirine (PIFELTRO) 100 MG TABS tablet Take 1 tablet (100 mg total) by mouth daily. 30 tablet 0   . ENSURE (ENSURE) Take 237 mLs by mouth 2 (two) times daily between meals. 237 mL 11   . HYDROcodone-acetaminophen (NORCO/VICODIN) 5-325 MG tablet Take 1 tablet by mouth every 6 (six) hours as needed for moderate pain or severe pain. (Patient not taking: Reported on 02/29/2020) 8 tablet 0   . hydrocortisone 2.5 % lotion Apply topically 2 (two) times daily. (Patient not taking: Reported on 02/21/2020) 59 mL 3   . IBU 800 MG tablet Take 800 mg by mouth 2 (two) times daily as needed. (Patient not taking: Reported on 02/29/2020)  5   . LATUDA 40 MG TABS tablet TAKE one tablet BY MOUTH EVERY EVENING with meals  2   . methocarbamol (ROBAXIN) 500 MG tablet Take 500 mg by mouth 2 (two) times daily as needed for muscle spasms.   5   . prenatal vitamin w/FE, FA (PRENATAL 1 + 1) 27-1 MG TABS tablet Take 1 tablet by mouth daily at 12 noon. 30 tablet 11   . sertraline (ZOLOFT) 50 MG tablet Take 1 tablet (50 mg total) by mouth daily. 30 tablet 5      Review of Systems:  All systems reviewed and negative except as stated in HPI  PE: Blood pressure (!) 120/64, pulse 69, temperature 97.8 F (36.6 C), temperature source Axillary, resp. rate 20, last menstrual period 06/30/2019, SpO2 100 %, unknown if currently breastfeeding. General appearance: combative, slowed mentation and uncooperative Lungs: regular rate and effort Heart: regular rate  Abdomen: soft, non-tender Extremities: Homans  sign is negative, no sign of DVT Presentation: cephalic EFM: 145 bpm, moderate variability, no accels, no decels Toco: CTX q6 minutes   SVE: VTX, +1, unable to assess cervical dilation 2/2 patient refusal  Prenatal labs: ABO, Rh: O/Positive/-- (07/08 1618) Antibody: Negative (07/08 1618) Rubella: 5.47 (07/08  1618) RPR: Non Reactive (07/08 1618)  HBsAg: Negative (07/08 1618)  HIV: Reactive (07/08 1618)  GBS:   neg on UCx 2 hr GTT none  Prenatal Transfer Tool  Maternal Diabetes: unknown Genetic Screening: Declined Maternal Ultrasounds/Referrals: IUGR, complete anatomy not well visualized Fetal Ultrasounds or other Referrals:  Referred to Materal Fetal Medicine  Maternal Substance Abuse:  Yes:  Type: Cocaine Significant Maternal Medications:  None Significant Maternal Lab Results: Group B Strep negative and HIV positive  Results for orders placed or performed during the hospital encounter of 03/25/20 (from the past 24 hour(s))  POCT fern test   Collection Time: 03/25/20 10:00 AM  Result Value Ref Range   POCT Fern Test Positive = ruptured amniotic membanes     Patient Active Problem List   Diagnosis Date Noted  . Genetic carrier status 03/15/2020  . Alpha thalassemia silent carrier 03/15/2020  . Advanced maternal age in multigravida, third trimester 03/01/2020  . Previous cesarean section complicating pregnancy 03/01/2020  . Supervision of high risk pregnancy, antepartum 10/31/2019  . Routine screening for STI (sexually transmitted infection) 07/14/2018  . Encounter for contraceptive management 07/14/2018  . Healthcare maintenance 07/14/2018  . Pregnancy affected by fetal growth restriction May 19, 2018  . Neonatal death 04/14/2018  . 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 HIV affecting pregnancy, antepartum 01/19/2018  . History of gestational diabetes mellitus (GDM) in prior pregnancy, currently pregnant 01/19/2018  . Pre-diabetes 01/13/2018  . Protein-calorie malnutrition,  severe (HCC) 11/24/2017  . Crack cocaine use   . Bipolar disorder (HCC)   . History of VBAC 08/31/2016  . Late prenatal care affecting pregnancy in third trimester 08/06/2016  . 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 Tardive dyskinesia 06/13/2014  . History of loop electrical excision procedure (LEEP) 12/25/2013  . 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 Schizophrenia (HCC) 10/18/2013  . Human immunodeficiency virus (HIV) disease (HCC) 07/27/2011    Assessment: Tamara Briggs is a 34 y.o. [id number] at [redacted]w[redacted]d here for SOL  1. Labor: expectant management 2. FWB: Cat I 3. Pain: per patient request 4. GBS: negative 5. Crack cocaine use: last use last night, SW consult 6. HIV+: SW consult, not on any medication. AZT ordered, though patient is refusing IV. 7. TOLAC: s/p VBAC x2. Desires TOLAC.   Plan: Admit to L&D. Anticipate NSVD shortly  Marlowe Alt, DO  03/25/2020, 10:24 AM

## 2020-03-25 NOTE — Discharge Summary (Signed)
Postpartum Discharge Summary      Patient Name: Tamara Briggs DOB: 13-Aug-1986 MRN: [id number]  Date of admission: 03/25/2020 Delivery date:03/25/2020  Delivering provider: Merilyn Baba  Date of discharge: 03/28/2020  Admitting diagnosis: Normal labor [O80, Z37.9] Intrauterine pregnancy: [redacted]w[redacted]d    Secondary diagnosis:  Principal Problem:   Normal labor Active Problems:   Human [place] virus (HIV) disease ([place]   Sch[place]ia ([place]   History of neural tube defect   History of loop electrical excision procedure (LEEP)   Secondary Parkinson disease ([place]   Tardive dyskinesia   Late prenatal care affecting pregnancy in third trimester   History of VBAC   Crack cocaine use   Bipolar disorder ([place]   HIV affecting pregnancy, antepartum   History of gestational diabetes mellitus (GDM) in prior pregnancy, currently pregnant   Neonatal death   Pregnancy affected by fetal growth restriction   Advanced maternal age in multigravida, third trimester   Previous cesarean section complicating pregnancy   Genetic carrier status   Alpha thalassemia silent carrier  Additional problems: None    Discharge diagnosis: Term Pregnancy Delivered and VBAC                                              Post partum procedures:None Augmentation: none Complications: None  Hospital course: Onset of Labor With Vaginal Delivery      34y.o. yo [id number] admitted in Active Labor on 03/25/2020. Patient's labor course is as follows:   Admitted complete/100/+1. Refused IV access and labs, so AZT was not able to be given during labor. Delivered after a prolonged second stage.   Membrane Rupture Time/Date:  ,03/25/2020   Delivery Method:VBAC, Spontaneous  Episiotomy: None  Lacerations:  None    Patient had postpartum course complicated by acute gait decompensation on PPD#2. Neurology was consulted due to worsening ataxia and difficulty walking. Neurology recommended physical therapy  evaluation, as her difficulty walking was attributed to worsening choreoathetotic movements in the context of cocaine abuse. Physical therapy evaluation was recommended, and she was found to need a rolling walker for assistance with ambulation. She is ambulating, tolerating a regular diet, passing flatus, and urinating well. Patient is discharged home in stable condition on 03/28/20.  Newborn Data: Birth date:03/25/2020  Birth time:1:43 PM  Gender:Female  Living status:Living  Apgars:8 ,9  [phone]   Magnesium Sulfate received: No BMZ received: No Rhophylac:N/A MMR:N/A T-DaP: declined Flu: declined Transfusion:No  Physical exam  Vitals:   03/27/20 1500 03/27/20 1905 03/28/20 0615 03/28/20 1401  BP: 120/77 131/67 (!) 147/86 135/79  Pulse: 60 64 64 67  Resp: 16 17 18 16   Temp: 98.5 F (36.9 C) 98 F (36.7 C) 97.7 F (36.5 C) 98.6 F (37 C)  TempSrc: Oral Oral Oral Oral  SpO2: 100% 100% 100% 100%  Weight:      Height:       General: alert, cooperative and no distress Lochia: appropriate Uterine Fundus: firm Incision: N/A DVT Evaluation: No evidence of DVT seen on physical exam. Labs: Lab Results  Component Value Date   WBC 14.8 (H) 03/26/2020   HGB 10.9 (L) 03/26/2020   HCT 34.1 (L) 03/26/2020   MCV 90.0 03/26/2020   PLT 124 (L) 03/26/2020   CMP Latest Ref Rng & Units 02/21/2020  Glucose 65 - 99 mg/dL 140(H)  BUN 7 - 25 mg/dL 14  Creatinine 0.50 - 1.10 mg/dL 0.62  Sodium 135 - 146 mmol/L 135  Potassium 3.5 - 5.3 mmol/L 3.9  Chloride 98 - 110 mmol/L 101  CO2 20 - 32 mmol/L 26  Calcium 8.6 - 10.2 mg/dL 8.8  Total Protein 6.1 - 8.1 g/dL 7.2  Total Bilirubin 0.2 - 1.2 mg/dL 0.2  Alkaline Phos 38 - 126 U/L -  AST 10 - 30 U/L 26  ALT 6 - 29 U/L 28   Edinburgh Score: Edinburgh Postnatal Depression Scale Screening Tool 03/25/2020  I have been able to laugh and see the funny side of things. 0  I have looked forward with enjoyment to things. 0  I have blamed myself  unnecessarily when things went wrong. 2  I have been anxious or worried for no good reason. 0  I have felt scared or panicky for no good reason. 0  Things have been getting on top of me. 0  I have been so unhappy that I have had difficulty sleeping. 0  I have felt sad or miserable. 0  I have been so unhappy that I have been crying. 0  The thought of harming myself has occurred to me. 0  Edinburgh Postnatal Depression Scale Total 2     After visit meds:  Allergies as of 03/28/2020   No Known Allergies     Medication List    STOP taking these medications   diphenhydrAMINE 2 % cream Commonly known as: BENADRYL   Ensure   HYDROcodone-acetaminophen 5-325 MG tablet Commonly known as: NORCO/VICODIN   hydrocortisone 2.5 % lotion     TAKE these medications   acetaminophen 325 MG tablet Commonly known as: Tylenol Take 2 tablets (650 mg total) by mouth every 4 (four) hours as needed. What changed: reasons to take this   Biktarvy 50-200-25 MG Tabs tablet Generic drug: bictegravir-emtricitabine-tenofovir AF Take 1 tablet by mouth daily.   doravirine 100 MG Tabs tablet Commonly known as: Pifeltro Take 1 tablet (100 mg total) by mouth daily.   ibuprofen 800 MG tablet Commonly known as: ADVIL Take 1 tablet (800 mg total) by mouth every 8 (eight) hours as needed. What changed: when to take this   Latuda 40 MG Tabs tablet Generic drug: lurasidone TAKE one tablet BY MOUTH EVERY EVENING with meals   norethindrone 0.35 MG tablet Commonly known as: Ortho Micronor Take 1 tablet (0.35 mg total) by mouth daily.   prenatal vitamin w/FE, FA 27-1 MG Tabs tablet Take 1 tablet by mouth daily at 12 noon.   sertraline 50 MG tablet Commonly known as: ZOLOFT Take 1 tablet (50 mg total) by mouth daily.            Durable Medical Equipment  (From admission, onward)         Start     Ordered   03/28/20 1154  For home use only DME Walker  Once       Question Answer Comment   Patient needs a walker to treat with the following condition Impairment of balance   Patient needs a walker to treat with the following condition Leg weakness      03/28/20 1154   03/28/20 0000  For home use only DME Walker rolling       Question Answer Comment  Walker: With [place]   Patient needs a walker to treat with the following condition Gait abnormality      03/28/20 1415  Discharge home in stable condition Infant Feeding: Bottle Infant Disposition: per CPS, mother does not have custody Discharge instruction: per After Visit Summary and Postpartum booklet. Activity: Advance as tolerated. Pelvic rest for 6 weeks.  Diet: routine diet Future Appointments: Future Appointments  Date Time Provider [place]  03/29/2020  3:45 PM [facility] NURSE [facility]  04/25/2020 11:00 AM Truett Mainland, DO [facility] None  04/30/2020  2:45 PM Carlyle Basques, MD [facility]   Follow up Visit:   Please schedule this patient for a In person postpartum visit in 4 weeks with the following provider: Any provider. Additional Postpartum F/U: birth control?  High risk pregnancy complicated by: HIV, h/o CS, h/o VBAC, psych disorders, drug abuse Delivery mode:  VBAC, Spontaneous  Anticipated Birth Control:  POPs   03/28/2020 Raye Slyter L Ethan Kasperski, DO

## 2020-03-25 NOTE — Progress Notes (Signed)
CSW received call from Ped informing CSW of concerns regarding MOB and infant. CSW attempted to speak with MOB at bedside however Mob lying in fetal position appearing to be asleep. CSW spoke with [facility] CPS and was advised that MOB other child Ayra Hodgdon is in their custody which qualified for an automaticCPS report. CSW made CPS report to Bernie Covey at this time due to MOB's reported substance use during pregnancy and other mental health concerns. CSW also advised CPS of concerns regarding Mob's ability to care for infant as MOB not responding to staff and refusing all medial treatment at this time.    Claude Manges Dierdra Salameh, MSW, LCSW [facility] [place] [phone]

## 2020-03-25 NOTE — Progress Notes (Signed)
Pt not appropriately responsive at time of admission, refusing fundal checks and parts of vital signs to be taken. Pt lying in bed in fetal position moaning nonsensically with every question asked, therefore this RN made the judgement call to keep infant in central nursery under care of that staff until pt is more alert and able to care for infant appropriately. RN also set pt's bed alarm for patient's safety due to reports from the L&D nurse of patient's limited awareness of abilities and safety concerns. RN deferred admission paperwork at this time as well, will complete when patient is more awake and alert.

## 2020-03-25 NOTE — MAU Provider Note (Signed)
S: Ms. Tamara Briggs is a 34 y.o. [id number] at [redacted]w[redacted]d  who presents to MAU via EMS today complaining of leaking of fluid since [place]. [place]. She denies vaginal bleeding. She endorses contractions. She reports normal fetal movement. She admits to using crack cocaine last night, but undisclosed amount.  O: BP (!) 120/64   Pulse 69   Temp 97.8 F (36.6 C) (Axillary)   Resp 20   LMP 06/30/2019   SpO2 100%  GENERAL: Well-developed, well-nourished female in no acute distress.  HEAD: Normocephalic, atraumatic.  CHEST: Normal effort of breathing, regular heart rate ABDOMEN: Soft, nontender, gravid PELVIC: Normal external female genitalia with small amount of clear fluid on perineum. Swab obtained for fern slide.  Cervical exam:   Patient refused SVE   Fetal Monitoring: Baseline: 145 Variability: moderate Accelerations: present Decelerations: absent Contractions: regular every 5 mins -- palpate moderate intensity  Results for orders placed or performed during the hospital encounter of 03/25/20 (from the past 24 hour(s))  POCT fern test     Status: None   Collection Time: 03/25/20 10:00 AM  Result Value Ref Range   POCT Fern Test Positive = ruptured amniotic membanes    She refused Bedside U/S, SVE or IV  A: SIUP at [redacted]w[redacted]d  SROM  P: RN instructed to call labor team for admission orders   Raelyn Mora, CNM 03/25/2020, 10:02 AM

## 2020-03-25 NOTE — Progress Notes (Signed)
Pt refusing all BP checks and temperature checks.  RN educated pt.  MD at bedside and aware.
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
## 2020-03-25 NOTE — Progress Notes (Addendum)
Pt refusing to answer admission questions.  RN asked pt if we can start an IV and lab work, and pt refused.  Pt explained importance of GBS swab and  refused testing. Pt agreed to be monitored with external fetal monitors.  MD at bedside and is aware.  Pt support person at bedside.  Will continue to monitor.

## 2020-03-25 NOTE — Discharge Instructions (Signed)

## 2020-03-26 LAB — CBC
HCT: 34.1 % — ABNORMAL LOW (ref 36.0–46.0)
Hemoglobin: 10.9 g/dL — ABNORMAL LOW (ref 12.0–15.0)
MCH: 28.8 pg (ref 26.0–34.0)
MCHC: 32 g/dL (ref 30.0–36.0)
MCV: 90 fL (ref 80.0–100.0)
Platelets: 124 10*3/uL — ABNORMAL LOW (ref 150–400)
RBC: 3.79 MIL/uL — ABNORMAL LOW (ref 3.87–5.11)
RDW: 13.8 % (ref 11.5–15.5)
WBC: 14.8 10*3/uL — ABNORMAL HIGH (ref 4.0–10.5)
nRBC: 0 % (ref 0.0–0.2)

## 2020-03-26 NOTE — Progress Notes (Signed)
Patient ID: Tamara Briggs, female   DOB: [birth year], 34 y.o.   MRN: [id number]          [facility] for Infectious Disease    Date of Admission:  03/25/2020     Ms. Tamara Briggs has a history of HIV infection, schizophrenia and recent intrauterine pregnancy.  She presented yesterday with spontaneous rupture of membranes and had spontaneous vaginal delivery of an infant medial yesterday afternoon.  Since admission she has been refusing blood draws and medications.  She was seen by my partner, Dr. Judyann Munson in June and her HIV viral load was undetectable at less than 20 at that time.  I would continue to encourage her to take Biktarvy and Pifeltro on a regular basis.  She has scheduled follow-up with Dr. Ilsa Iha on 04/30/2020.  Please call if we can be of assistance while she is hospitalized.         Cliffton Asters, MD [facility] for Infectious Disease [facility] [phone] pager   [phone] cell 03/26/2020, 9:17 AM

## 2020-03-26 NOTE — Clinical Social Work Maternal (Signed)
CLINICAL SOCIAL WORK MATERNAL/CHILD NOTE  Patient Details  Name: Tamara Briggs MRN: [id number] Date of Birth: 10/02/1985  Date:  10-25-19  Clinical Social Worker Initiating Note:  Hortencia Pilar, LCSW Date/Time: Initiated:  03/26/20/0936     Child's Name:  Tamara Briggs   Biological Parents:  Mother Tamara Briggs)   Need for Interpreter:  None   Reason for Referral:  Current Substance Use/Substance Use During Pregnancy , Late or No Prenatal Care , Current CPS Involvement, Behavioral Health Concerns   Address:  [address] [place] [address]    Phone number:  [phone] (home)     Additional phone number: none   Household Members/Support Persons (HM/SP):   Household Member/Support Person 1   HM/SP Name Relationship DOB or Age  HM/SP -1 Tamara Briggs  MOB   09/11/1985  HM/SP -2  Tamara Briggs (not in the home)   daughter   04/10/08  HM/SP -3  Tamara Briggs (not in the home)   son   October 23, 2016   HM/SP -4  Tamara Briggs (not in the home)   son   06/08/2018  HM/SP -5        HM/SP -6        HM/SP -7        HM/SP -8          Natural Supports (not living in the home):      Professional Supports: None   Employment: Disabled   Type of Work: MOB reports that she gets disability   Education:  9 to 11 years   Homebound arranged: No  Financial Resources:  [employer]   Other Resources:  Sales executive , [employer]   Cultural/Religious Considerations Which May Impact Care:  none   Strengths:  Psychotropic Medications   Psychotropic Medications:  Zoloft, Other meds (MOB reports that she is on other medications for mental health but is unable to give CSW that information at this time.)      Pediatrician:     not yet chosen   Pediatrician List:   Radiographer, therapeutic    [place]      Pediatrician Fax Number:    Risk Factors/Current Problems:  Substance Use , Mental Health Concerns , DHHS  Involvement    Cognitive State:  Alert    Mood/Affect:  Relaxed , Calm    CSW Assessment: CSW consulted as MOB has a hx of schizophrenia, Bipolar, cocaine use during this pregnancy, not having custody of other children, as well as MOB is HIV positive. CSW went to speak with MOB at bedside to address further needs and offer further supports.   CSW entered the room and MOB was initially not receptive to speaking with this CSW. advised MOB that it was very important that she spoke with CSW so that CSW would have accurate information. MOB became a little more receptive in speaking with CSW. CSW advised MOB of CSW's role and the reason for CSW coming to speak with her. MOB reported that's he was diagnosed with her mental health hx about 5 years ago. MOB reported "that has nothing to do with this, I am stable". CSW praised MOB for this and asked MOB about her medications for her mental health. MOB reported that she takes Zoloft "and other medications. I take them every day and every night like I am supposed to". CSW  asked MOB what medications these were however MOB didn't respond. CSW asked MOB about her HIV diagnosis and MOB refused to give information to CSW regarding this. MOB  Did ask CSW "if you are undetectable, you cant transmit HIV, right? 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 CSW asked MOB about follow up care for infant being that MOB is HIV positive and MOB declined to answer any questions regarding this.   CSW inquired from MOB on her substance use during pregnancy . MOB reported that she used crack/cociane. MOB began to cry to CSW as MOB reported "I want to stop but its so hard". CSW offered MOB support and SA resources. MOB reported that she has reached out to SA treatment facilities in the past, but nothing worked for her. MOB reported a hx of being with [facility] and verbalized interest in [facility] to this CSW. CSW agreed to assist MOB as needed in getting resources established. MOB reported that she had  no reason for using sustances and reports that her last use was "4 days ago". CSW understanding and advised MOB taht CSW would need to update CPS worker of this, in which CSW has done. MOB reported that she has CPS hx with her other children. MOB reported that Tamara Briggs 04/10/2008 lives with her father Tamara Briggs in [place], [place]. MOB reported that her two son's Tamara Briggs, 10/23/2016 and Tamara Briggs, 10/16, 2019 are all in the custody of CPS. MOB reported that her oldest son Tamara was adopted out via Foster Care. MOB reported that she is supposed to have virtual visits with her son Tamara, however is unable to recall last visit.   CSW spoke with CPS worker Tamara Briggs and was advised that at this time they are working on a plan for infant. CSW was advised that CPS would follow back up with CSW once plan has been established. CSW encouraged CPS to have plan ASAP as infant may be ready to d/c soon, CPS worker to call CSW back with information. CSW asked MOB about her ability to care for infant. MOB reported that she has all the needed items to care for her child however no carseat just yet.   CSW took time to provide MOB with PPD and SIDS education. MOB reported that she did deal with PPD after her other children. CSW provided MOB with PPD Checklist for her to keep track of her feelings as they relate to PPD.   At this time CSW has been in contact with Tamara Briggs with CPS. CSW was advised that CFT is scheduled for Thursday morning, where further plans for infant will be discussed. At this time there are barriers to infant d/c to MOB.   CSW Plan/Description:  CSW Will Continue to Monitor Umbilical Cord Tissue Drug Screen Results and Make Report if Warranted, CSW Awaiting CPS Disposition Plan, Child Protective Service Report , Hospital Drug Screen Policy Information, Perinatal Mood and Anxiety Disorder (PMADs) Education, Sudden Infant Death Syndrome (SIDS) Education    Tamara Briggs S Tamara Briggs, LCSWA 03/26/2020,  10:20 AM 

## 2020-03-26 NOTE — Progress Notes (Signed)
CSW attempted to speak with MOB at bedside, however MOB requested that CSW return after she eats breakfast and "after my very important phone call". CSW advised MOB that it was essential that CSW spoke with her and MOB reported that she would.  CSW made aware by RN that Wes Early from [place] CPS came to speak with MOB on yesterday evening and created a safety plan for MOB. CSW located information in room. CSW has also reached back out to [place] CPS to see who has been assigned to this case. CSW left message at this time.    Tamara Briggs, MSW, LCSW [facility] [place] [phone]  

## 2020-03-26 NOTE — Progress Notes (Signed)
Post Partum Day 1  Subjective: no complaints, up ad lib, voiding, tolerating PO and + flatus  Objective: Blood pressure (!) 133/93, pulse 77, temperature 98.4 F (36.9 C), temperature source Oral, resp. rate 18, height 5\' 4"  (1.626 m), weight 49.9 kg, last menstrual period 06/30/2019, SpO2 100 %, unknown if currently breastfeeding.  Physical Exam:  General: alert, cooperative, appears stated age and mild distress, intermittently cooperative Lochia: appropriate Uterine Fundus: firm Incision: N/A DVT Evaluation: No evidence of DVT seen on physical exam.  No results for input(s): HGB, HCT in the last 72 hours.  Assessment/Plan: --Social work and ID consults pending --Patient agreeable to having admission labs collected this morning   LOS: 1 day   13/01/2019, CNM 03/26/2020, 6:13 AM

## 2020-03-26 NOTE — Progress Notes (Signed)
CSW attempted to establish follow up care for infant with Infectious Disease clinic for infant. CSW was advised to call back once plan has been established for infant. CSW will follow back with Infectious Disease once plan has been confirmed vis CPS.      Tamara Briggs, MSW, LCSW [facility] [place] [phone]

## 2020-03-27 LAB — RAPID URINE DRUG SCREEN, HOSP PERFORMED
Amphetamines: NOT DETECTED
Barbiturates: NOT DETECTED
Benzodiazepines: NOT DETECTED
Cocaine: POSITIVE — AB
Opiates: NOT DETECTED
Tetrahydrocannabinol: NOT DETECTED

## 2020-03-27 LAB — HIV-1 RNA QUANT-NO REFLEX-BLD
HIV 1 RNA Quant: 30 copies/mL
LOG10 HIV-1 RNA: 1.477 log10copy/mL

## 2020-03-27 LAB — SURGICAL PATHOLOGY

## 2020-03-27 NOTE — Progress Notes (Signed)
POSTPARTUM PROGRESS NOTE  Post Partum Day 2  Subjective:  Tamara Briggs is a 34 y.o. [id number] s/p uncomplicated SVD at [redacted]w[redacted]d.  She reports she is doing well. No acute events overnight. She denies any problems with voiding or po intake. Denies nausea or vomiting.  Pain is moderately controlled.  Lochia is appropriate.   Patient states she has had difficulty ambulating to the restroom, she feels unsteady on her feet. Denies pain in her lower extremities. Denies injury to her lower extremities. States this occurred overnight. Per RN report it has worsened overnight and she has required assist and walker to ambulate to the bathroom. No difficulty with speech. No upper extremity weakness. No difficulty with swallowing or speech. No numbness/tingling. No vision changes.  Objective: Blood pressure 99/67, pulse 63, temperature 98.5 F (36.9 C), temperature source Oral, resp. rate 18, height 5\' 4"  (1.626 m), weight 49.9 kg, last menstrual period 06/30/2019, SpO2 100 %, unknown if currently breastfeeding.  Physical Exam:  General: alert, cooperative and no distress, fidgeting on exam Chest: no respiratory distress Heart:regular rate, distal pulses intact Abdomen: soft, nontender,  Uterine Fundus: firm, appropriately tender DVT Evaluation: No calf swelling or tenderness Extremities: no edema Neuro: unsteady gait, 5/5 strength b/l UE/LE, sensation intact, EOMI, PERRLA, CN II-XII intact. Skin: warm, dry  Recent Labs    03/26/20 0549  HGB 10.9*  HCT 34.1*    Assessment/Plan: Tamara Briggs is a 34 y.o. 31 s/p uncomplicated SVD at [redacted]w[redacted]d   PPD#2   PRN PO pain meds   Routine postpartum care #HIV: ID consulted, outpatient follow up arranged, compliance with med regimen encouraged  #Hx of drug abuse #AMS Patient endorsed cocaine use upon admission. Unable to collect UDS this admission. No UDS with 1 prenatal appt. Patient is fidgety on exam and unable to converse. Patient did have a visitor  overnight. Suspect continued drug use since admission.  -RN encouraged to collect UDS -SW consulted, CPS involved  #Difficulty Ambulating #Tardive dyskinesia, parkinsonisms 2/2 med side effect #Bipolar Disorder Patient with difficulty ambulating, worse overnight. No focal neurologic deficits on exam. Unclear etiology at this time. Will have neuro evaluate. Appreciate recs.  Contraception: patient desires interval BTL, has not been consented Feeding: bottle Dispo: Plan for discharge pending neuro eval, maybe tomorrow.   LOS: 2 days   [redacted]w[redacted]d, MD OB Fellow  03/27/2020, 12:06 PM
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
## 2020-03-27 NOTE — Progress Notes (Signed)
CSW updated by CPS worker that meeting will be held tomorrow at 1pm to determine further placement for infant.     Claude Manges Lessie Funderburke, MSW, LCSW [facility] [place] [phone]

## 2020-03-28 MED ORDER — NORETHINDRONE 0.35 MG PO TABS
1.0000 | ORAL_TABLET | Freq: Every day | ORAL | 11 refills | Status: DC
Start: 2020-03-28 — End: 2020-12-09

## 2020-03-28 MED ORDER — ACETAMINOPHEN 325 MG PO TABS: 650.0000 mg | ORAL_TABLET | ORAL | 0 refills | Status: AC | PRN

## 2020-03-28 MED ORDER — LATUDA 40 MG PO TABS: ORAL_TABLET | ORAL | 2 refills | Status: AC

## 2020-03-28 MED ORDER — IBUPROFEN 800 MG PO TABS
800.0000 mg | ORAL_TABLET | Freq: Three times a day (TID) | ORAL | 0 refills | Status: DC | PRN
Start: 2020-03-28 — End: 2020-11-19

## 2020-03-28 NOTE — TOC Initial Note (Signed)
Transition of Care [facility]) - Initial/Assessment Note    Patient Details  Name: Tamara Briggs MRN: [id number] Date of Birth: 27-Jul-1986  Transition of [facility]) CM/SW Contact:    Oletta Cohn, RN Phone Number: 03/28/2020, 12:03 PM  Clinical Narrative:                 Rolla Flatten. Lucretia Roers, RN, BSN, [place] [phone] RNCM spoke with pt at bedside regarding discharge planning for Home Health Services. Offered pt medicare.gov list of home health agencies to choose from.  Pt chose [facility] to render services. [place] [facility] notified. Patient made aware that [facility] will be in contact in 24-48 hours.  Durable Medical Equipment (DME) needs identified at this time include rolling walker.  Jenness Corner of [facility] notified to deliver rolling walker to pt room prior to discharge home.  RNCM requested face to face and dme order of attending MD.   Expected Discharge Plan: Home w Home Health Services Barriers to Discharge: Barriers Resolved   Patient Goals and CMS Choice   CMS Medicare.gov Compare Post Acute Care list provided to:: Patient Choice offered to / list presented to : Patient  Expected Discharge Plan and Services Expected Discharge Plan: Home w Home Health Services   Discharge Planning Services: CM Consult Post Acute Care Choice: Durable Medical Equipment, Home Health Living arrangements for the past 2 months: Apartment                 DME Arranged: Walker rolling DME Agency: [facility] Date DME Agency Contacted: 03/28/20 Time DME Agency Contacted: [phone] Representative spoke with at DME Agency: Zack HH Arranged: PT HH Agency: [facility] Date HH Agency Contacted: 03/28/20 Time HH Agency Contacted: [phone]    Prior Living Arrangements/Services Living arrangements for the past 2 months: Apartment Lives with:: Self Patient language and need for interpreter reviewed:: Yes Do you feel safe going back to the place where you live?: Yes               Activities  of Daily Living Home Assistive Devices/Equipment: None ADL Screening (condition at time of admission) Patient's cognitive ability adequate to safely complete daily activities?: No Is the patient deaf or have difficulty hearing?: No Does the patient have difficulty seeing, even when wearing glasses/contacts?: No Does the patient have difficulty concentrating, remembering, or making decisions?: Yes Patient able to express need for assistance with ADLs?: Yes Does the patient have difficulty dressing or bathing?: No Independently performs ADLs?: Yes (appropriate for developmental age) Does the patient have difficulty walking or climbing stairs?: No Weakness of Legs: None Weakness of Arms/Hands: None  Permission Sought/Granted Permission sought to share information with : Case Manager Permission granted to share information with : Yes, Verbal Permission Granted              Emotional Assessment       Orientation: : Oriented to  Time, Oriented to Situation, Oriented to Place, Oriented to Self Alcohol / Substance Use: Illicit Drugs Psych Involvement: Outpatient Provider  Admission diagnosis:  Normal labor [O80, Z37.9] Patient Active Problem List   Diagnosis Date Noted  . Normal labor 03/25/2020  . Genetic carrier status 03/15/2020  . Alpha thalassemia silent carrier 03/15/2020  . Advanced maternal age in multigravida, third trimester 03/01/2020  . Previous cesarean section complicating pregnancy 03/01/2020  . Supervision of high risk pregnancy, antepartum 10/31/2019  . Routine screening for STI (sexually transmitted infection) 07/14/2018  . Encounter for contraceptive management 07/14/2018  .  Healthcare maintenance 07/14/2018  . Pregnancy affected by fetal growth restriction 2018/05/16  . Neonatal death 2018/04/11  . 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 HIV affecting pregnancy, antepartum 01/19/2018  . History of gestational diabetes mellitus (GDM) in prior pregnancy, currently  pregnant 01/19/2018  . Pre-diabetes 01/13/2018  . Protein-calorie malnutrition, severe (HCC) 11/24/2017  . Crack cocaine use   . Bipolar disorder (HCC)   . History of VBAC 08/31/2016  . Late prenatal care affecting pregnancy in third trimester 08/06/2016  . 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 Tardive dyskinesia 06/13/2014  . History of loop electrical excision procedure (LEEP) 12/25/2013  . 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 Schizophrenia (HCC) 10/18/2013  . Human immunodeficiency virus (HIV) disease (HCC) 07/27/2011   PCP:  Fleet Contras, MD Pharmacy:   [facility]- [place], [place] - [address] [place] [address] Phone: [phone] Fax: [phone]  [facility]- [place], [place] - [address] [place] [address] Phone: [phone] Fax: [phone]  [facility] - [place], [place] - [address] [place] [address] Phone: [phone] Fax: [phone]     Social Determinants of Health (SDOH) Interventions    Readmission Risk Interventions No flowsheet data found.

## 2020-03-28 NOTE — Evaluation (Signed)
Physical Therapy Evaluation Patient Details Name: Tamara Briggs MRN: [id number] DOB: 1986-04-21 Today's Date: 03/28/2020   History of Present Illness  Pt adm for labor and vaginal delivery. Pt with ongoing cocaine use. Pt with worsening gait after delivery and neuro consulted. 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  Clinical Impression  Pt presents to PT with abnormal gait which appears to be functional in nature and inconsistent between caregivers. With walker pt able to amb  without loss of balance. Told pt she should see daily improvement in gait and that would recommend a walker at home. Also recommend home PT eval is pt able to have one with her insurance provider.     Follow Up Recommendations Home health PT    Equipment Recommendations  Rolling walker with 5" wheels    Recommendations for Other Services       Precautions / Restrictions Precautions Precautions: Fall      Mobility  Bed Mobility Overal bed mobility: Modified Independent                Transfers Overall transfer level: Modified independent Equipment used: Rolling walker (2 wheeled)                Ambulation/Gait Ambulation/Gait assistance: Supervision Gait Distance (Feet): 50 Feet Assistive device: Rolling walker (2 wheeled) Gait Pattern/deviations: Step-through pattern;Decreased stride length;Narrow base of support Gait velocity: decr Gait velocity interpretation: <1.31 ft/sec, indicative of household ambulator General Gait Details: Pt with very slow deliberate steps with feet hovering 1-2" off of floor for several seconds prior to placing them on the floor. Attempted verbal cues to increase speed of foot placement without success.   Stairs            Wheelchair Mobility    Modified Rankin (Stroke Patients Only)       Balance Overall balance assessment: Needs  assistance Sitting-balance support: No upper extremity supported;Feet supported Sitting balance-Leahy Scale: Normal     Standing balance support: No upper extremity supported Standing balance-Leahy Scale: Fair Standing balance comment: Walker for support for any dynamic movement                             Pertinent Vitals/Pain Pain Assessment: 0-10 Pain Score: 8  Pain Location: abdomen Pain Intervention(s): Limited activity within patient's tolerance;Repositioned    Home Living Family/patient expects to be discharged to:: Private residence Living Arrangements: Alone   Type of Home: Apartment Home Access: Elevator     Home Layout: One level Home Equipment: None      Prior Function Level of Independence: Independent               Hand Dominance        Extremity/Trunk Assessment   Upper Extremity Assessment Upper Extremity Assessment: RUE deficits/detail;LUE deficits/detail RUE Deficits / Details: Strength 5/5, choreoathetotic movements LUE Deficits / Details: Strength intact. choreoathetotic movements    Lower Extremity Assessment Lower Extremity Assessment: Overall WFL for tasks assessed (Strength 5/5, coordination normal to testing)       Communication   Communication: No difficulties  Cognition Arousal/Alertness: Awake/alert Behavior During Therapy: Flat affect Overall Cognitive Status: No family/caregiver present to determine baseline cognitive functioning  General Comments      Exercises     Assessment/Plan    PT Assessment All further PT needs can be met in the next venue of care  PT Problem List Decreased mobility;Decreased balance       PT Treatment Interventions      PT Goals (Current goals can be found in the Care Plan section)  Acute Rehab PT Goals PT Goal Formulation: All assessment and education complete, DC therapy    Frequency     Barriers to discharge         Co-evaluation               AM-PAC PT "6 Clicks" Mobility  Outcome Measure Help needed turning from your back to your side while in a flat bed without using bedrails?: None Help needed moving from lying on your back to sitting on the side of a flat bed without using bedrails?: None Help needed moving to and from a bed to a chair (including a wheelchair)?: None Help needed standing up from a chair using your arms (e.g., wheelchair or bedside chair)?: None Help needed to walk in hospital room?: A Little Help needed climbing 3-5 steps with a railing? : A Little 6 Click Score: 22    End of Session   Activity Tolerance: Patient tolerated treatment well Patient left: in bed;with call bell/phone within reach Nurse Communication: Mobility status PT Visit Diagnosis: Other abnormalities of gait and mobility (R26.89)    Time: 8478-4128 PT Time Calculation (min) (ACUTE ONLY): 14 min   Charges:   PT Evaluation $PT Eval Low Complexity: 1 Low          Hanna Pager [phone] Office [place] 03/28/2020, 10:08 AM

## 2020-03-28 NOTE — Progress Notes (Signed)
CSW and MOB attended CFT call with CPS. Per information given to CSW via call, CPS will be filing petition to take custody of infant. CSW aware that due to this MOB is no longer allowed to visit with infant or get information on infant, CSW has updated staff of this. CSW aware that MOB has been updated of this as well as, MOB was on call and heard CPS report to her that they are taking custody of infant.   CSW aware that walker is being delivered to room once obtained. CSW has no other concerns noted at this time. CSW will continue to  follow infant and give further updates to CPS as it pertaining to foster placement for infant. CSW did advised CPS that infant would likely be ready for d/c on 03/29/20 per Peds.    Jaina Morin S. Temeca Somma, MSW, LCSW [facility] [place] [phone]  

## 2020-03-28 NOTE — Progress Notes (Signed)
CSW updated that MOB is in need of Home Health. CSW spoke with [facility] covering to better assist with this. CSW will get call back once [facility] has been established.     Claude Manges Datrell Dunton, MSW, LCSW [facility] [employer] [phone]

## 2020-03-29 ENCOUNTER — Ambulatory Visit: Payer: Medicaid Other

## 2020-04-25 ENCOUNTER — Ambulatory Visit: Payer: Medicaid Other | Admitting: Family Medicine

## 2020-04-30 ENCOUNTER — Ambulatory Visit: Payer: Medicaid Other | Admitting: Internal Medicine

## 2020-06-11 ENCOUNTER — Ambulatory Visit: Payer: Medicaid Other | Admitting: Infectious Diseases

## 2020-07-14 IMAGING — US US MFM OB DETAIL+14 WK
1 series · 13 of 28 positions shown · non-contrast
Comparison: none

[Series 1: us mfm ob detail+14 wk · 95 acquisitions, 13 frames shown]
[im 4/95]
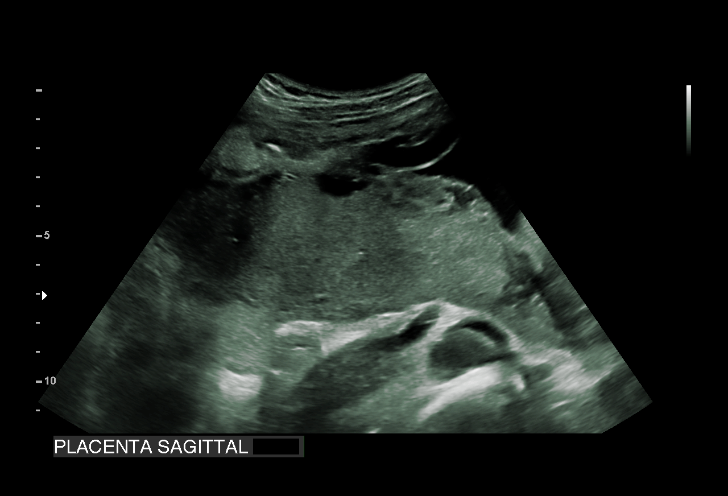
[im 11/95]
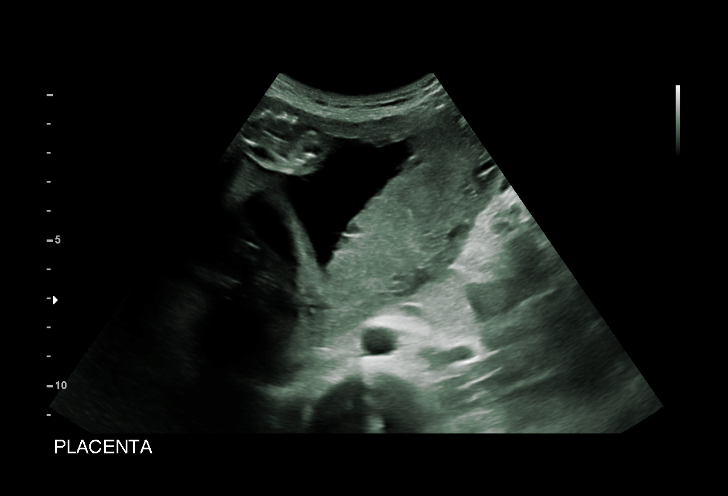
[im 18/95]
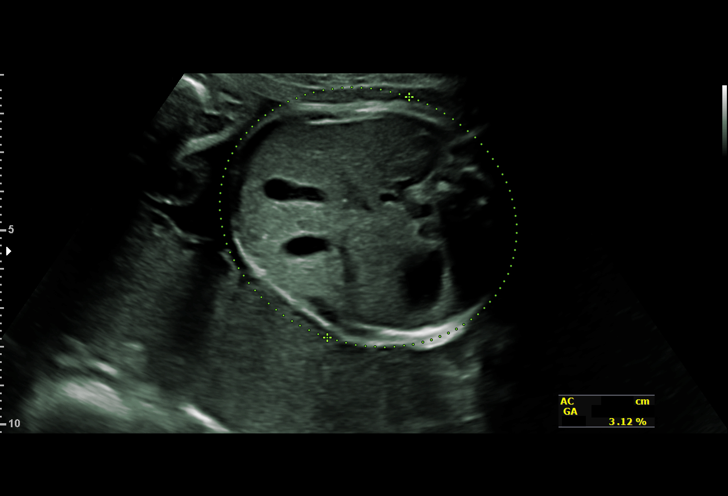
[im 25/95]
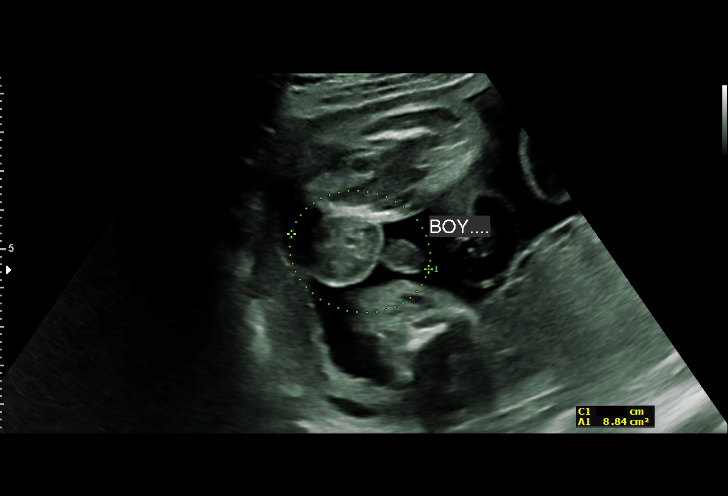
[im 32/95]
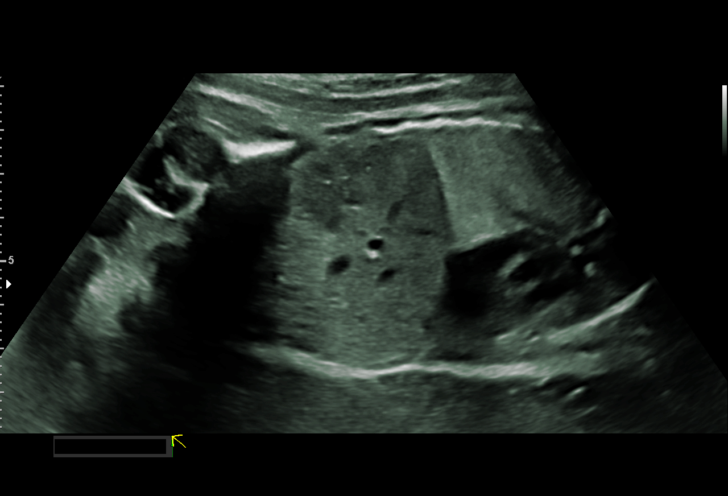
[im 39/95]
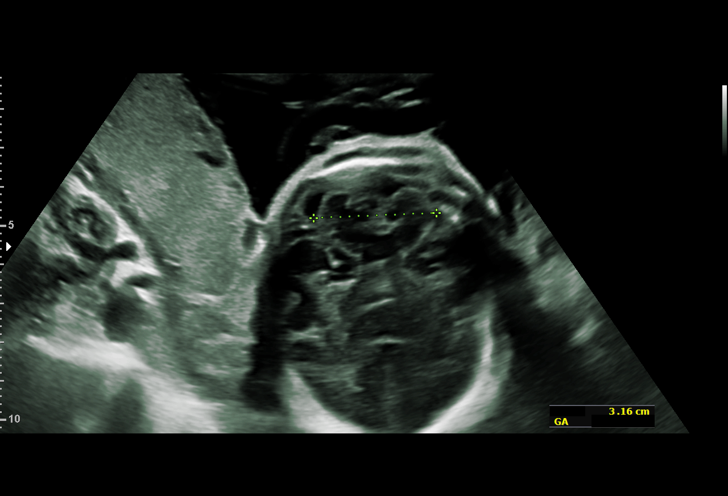
[im 49/95]
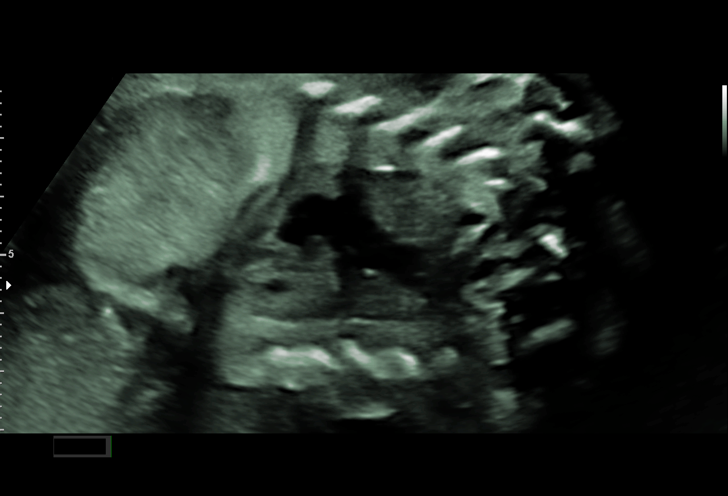
[im 56/95]
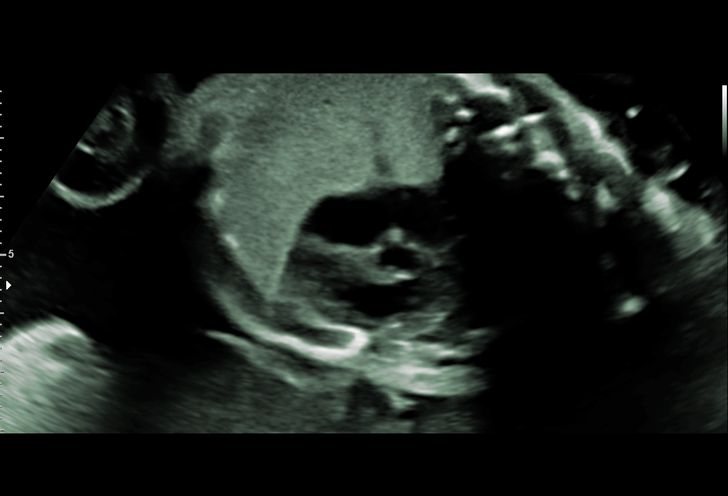
[im 63/95]
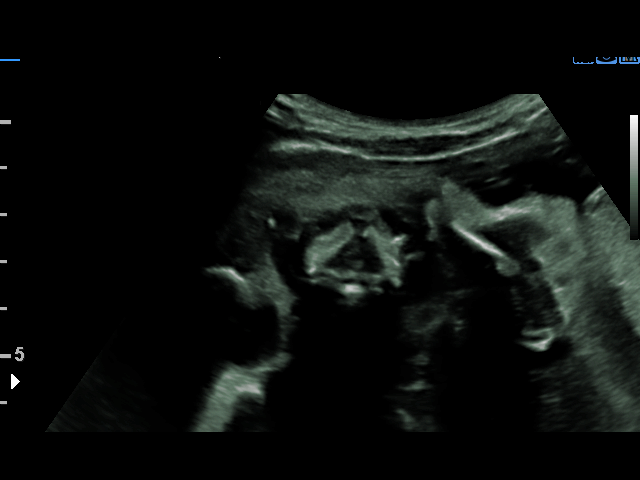
[im 70/95]
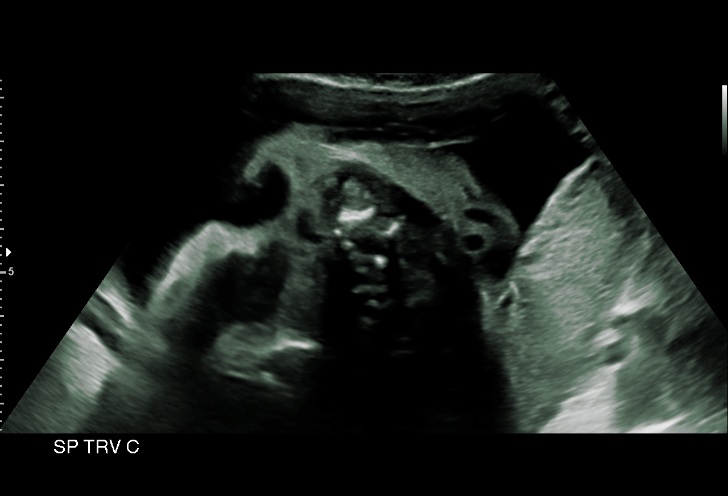
[im 77/95]
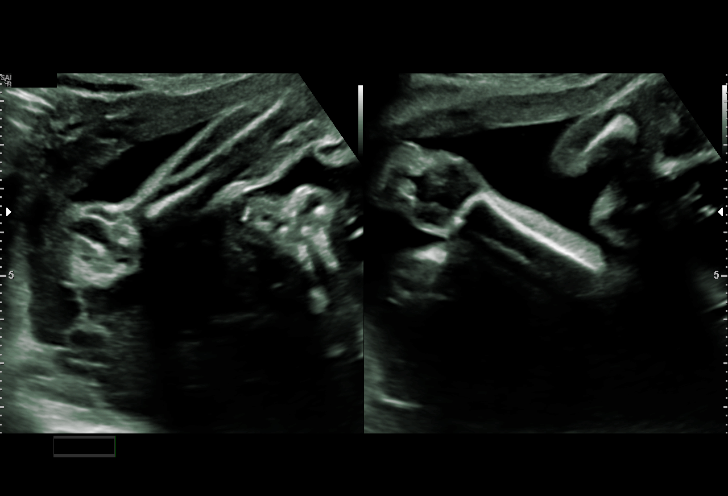
[im 84/95]
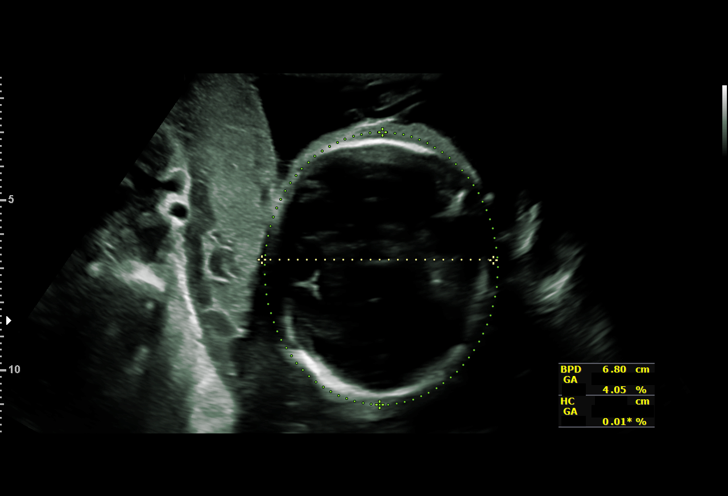
[im 91/95]
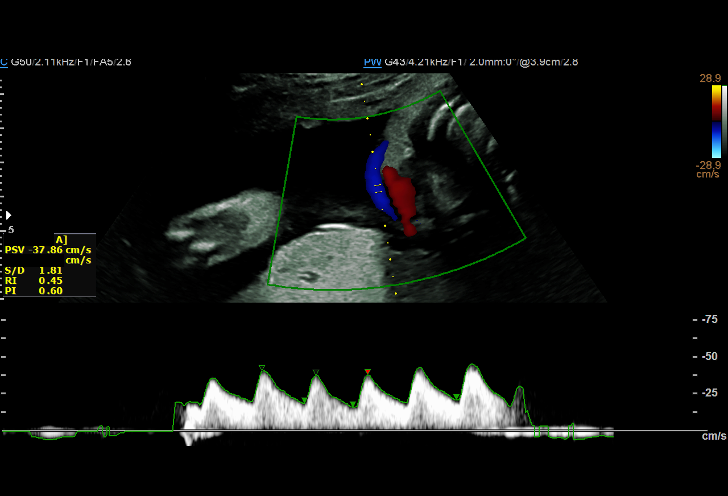

[13 of 28 positions shown; findings below may reference images not displayed]

OB/Gyn Clinic

Indications

Encounter for antenatal screening for
malformations
29 weeks gestation of pregnancy
Other mental disorder complicating
pregnancy, third trimester - schizophrenia,
bipolar, anxiety
Cocaine abuse
Smoking complicating pregnancy, third
trimester
HIV affecting pregnancy, third trimester
History of congenital or genetic condition -
NTD surgically repaired
History of cesarean delivery, currently
pregnant - VBAC
Poor obstetric history: Previous preterm
delivery, antepartum - 29 and 35 weeks
Poor obstetric history: Previous gestational
diabetes
Fetal Evaluation

Num Of Fetuses:         1
Fetal Heart Rate(bpm):  143
Cardiac Activity:       Observed
Presentation:           Cephalic
Placenta:               Posterior
P. Cord Insertion:      Not well visualized

Amniotic Fluid
AFI FV:      Within normal limits

AFI Sum(cm)     %Tile       Largest Pocket(cm)
12.99           37

RUQ(cm)       RLQ(cm)       LUQ(cm)        LLQ(cm)
3.38
Biophysical Evaluation

Amniotic F.V:   Within normal limits       F. Tone:        Observed
F. Movement:    Observed                   Score:          [DATE]
F. Breathing:   Observed
Biometry

BPD:        68  mm     G. Age:  27w 3d          4  %    CI:         83.9   %    70 - 86
FL/HC:      22.3   %    19.6 -
HC:       234   mm     G. Age:  25w 3d        < 3  %    HC/AC:      1.04        0.99 -
AC:      225.9  mm     G. Age:  27w 0d          4  %    FL/BPD:     76.6   %    71 - 87
FL:       52.1  mm     G. Age:  27w 5d          9  %    FL/AC:      23.1   %    20 - 24
HUM:      48.3  mm     G. Age:  28w 2d         31  %
CER:      32.8  mm     G. Age:  28w 6d         47  %

Est. FW:    5585  gm      2 lb 4 oz     20  %
Gestational Age

U/S Today:     26w 6d                                        EDD:   07/28/18
Best:          29w 0d     Det. By:  U/S C R L  (01/05/18)    EDD:   07/13/18
Anatomy

Cranium:               Appears normal         LVOT:                   Appears normal
Cavum:                 Not well visualized    Aortic Arch:            Appears normal
Ventricles:            Not well visualized    Ductal Arch:            Appears normal
Choroid Plexus:        Not well visualized    Diaphragm:              Appears normal
Cerebellum:            Appears normal         Stomach:                Appears normal, left
sided
Posterior Fossa:       Not well visualized    Abdomen:                Appears normal
Nuchal Fold:           Not applicable (>20    Abdominal Wall:         Appears nml (cord
wks GA)                                        insert, abd wall)
Face:                  Appears normal         Cord Vessels:           Appears normal (3
(orbits and profile)                           vessel cord)
Lips:                  Appears normal         Kidneys:                Appear normal
Palate:                Appears normal         Bladder:                Appears normal
Thoracic:              Appears normal         Spine:                  Appears normal
Heart:                 Appears normal         Upper Extremities:      Appears normal
(4CH, axis, and
situs)
RVOT:                  Appears normal         Lower Extremities:      Appears normal

Other:  Technically difficult due to fetal position.
Doppler - Fetal Vessels

Umbilical Artery
S/D     %tile     RI              PI                     ADFV    RDFV
2.39       21   0.58             0.85                        No      No
Cervix Uterus Adnexa

Cervix
Not visualized (advanced GA >18wks)

Left Ovary
Not visualized.

Right Ovary
Not visualized.
Comments

U/S images reviewed. Findings reviewed with patient.
Appropriate fetal growth is noted.  AC is less than the 10%.
No fetal abnormalities are seen.  No evidence of fetal
compromise is found on BPP today.
Questions answered.
10 minutes spent face to face.
Recommendations: 1) Follow-up growth and completion of
anatomy in 3 weeks
Recommendations

1) Follow-up growth and completion of anatomy in 3 weeks

## 2020-07-16 ENCOUNTER — Ambulatory Visit: Payer: Medicaid Other | Admitting: Internal Medicine

## 2020-08-06 ENCOUNTER — Telehealth: Payer: Self-pay

## 2020-08-06 DIAGNOSIS — B20 Human immunodeficiency virus [HIV] disease: Secondary | ICD-10-CM

## 2020-08-06 MED ORDER — BIKTARVY 50-200-25 MG PO TABS: 1.0000 | ORAL_TABLET | Freq: Every day | ORAL | 0 refills | Status: DC

## 2020-08-06 MED ORDER — DORAVIRINE 100 MG PO TABS: 100.0000 mg | ORAL_TABLET | Freq: Every day | ORAL | 0 refills | Status: DC

## 2020-08-06 NOTE — Telephone Encounter (Signed)
Patient called requesting refill for Biktarvy and Pifeltro. Patient has appointment scheduled with Dr. Drue Second for 08/08/20, states she ran out of medication yesterday. RN advised patient that we will refill, but in order to get additional refills she will need to keep her appointment this Thursday. Patient verbalized understanding and has no further questions.   Sandie Ano, RN

## 2020-08-08 ENCOUNTER — Ambulatory Visit: Payer: Medicaid Other | Admitting: Internal Medicine

## 2020-08-19 IMAGING — US US MFM UA CORD DOPPLER
1 series · 10 of 10 positions shown · non-contrast
Comparison: none

[Series 1: us mfm ua cord doppler · 10 acquisitions, 10 frames shown]
[im 1/10]
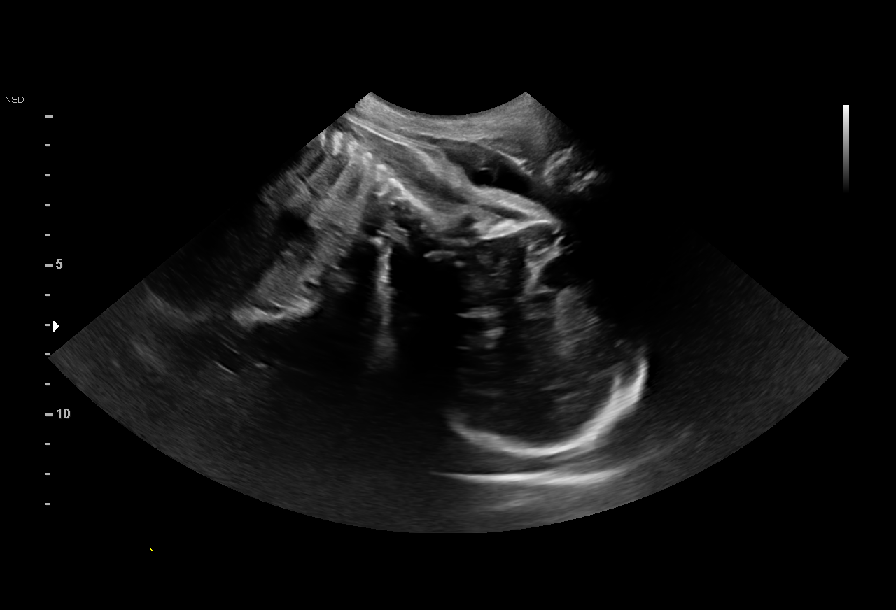
[im 2/10]
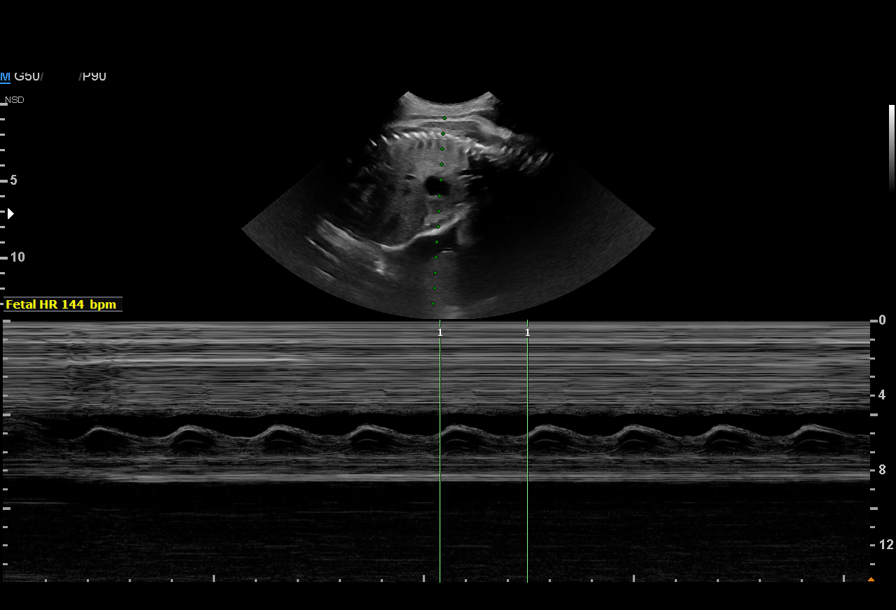
[im 3/10]
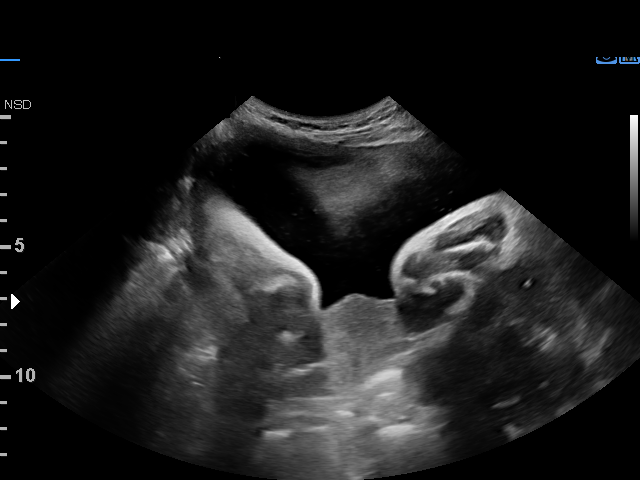
[im 4/10]
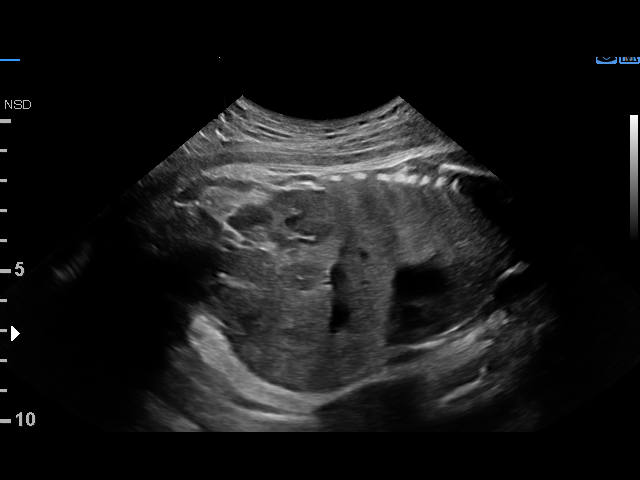
[im 5/10]
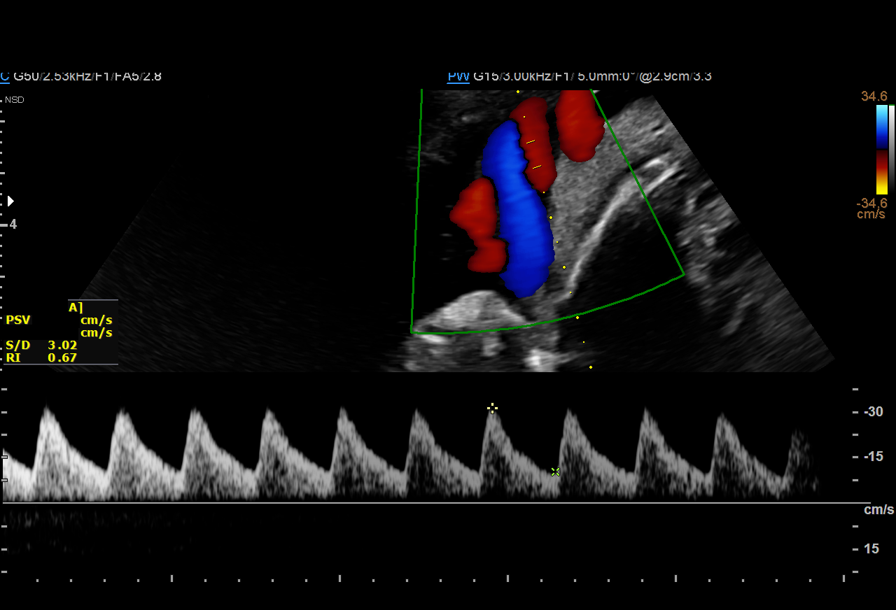
[im 6/10]
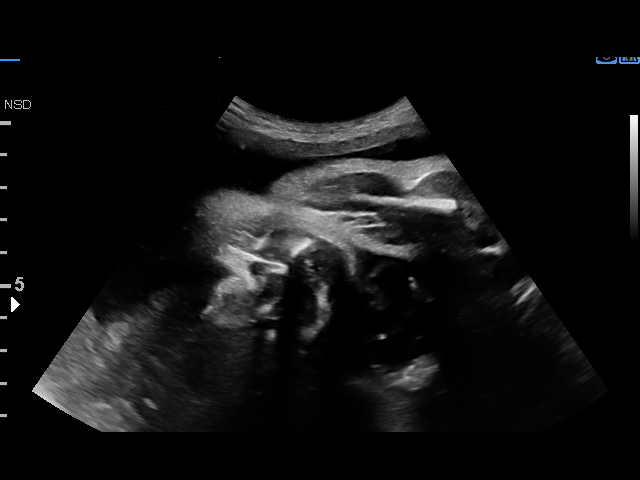
[im 7/10]
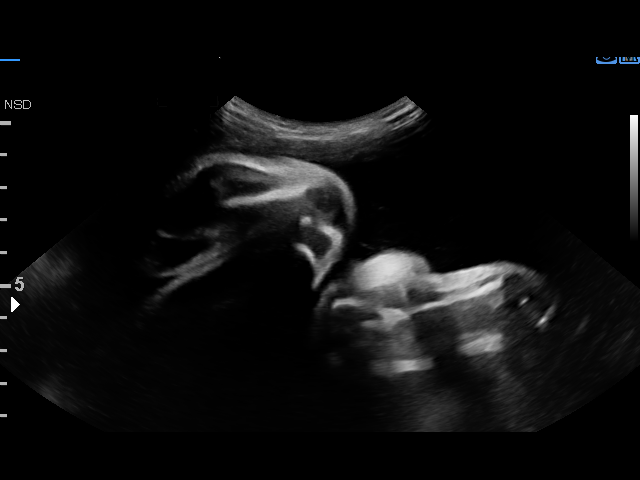
[im 8/10]
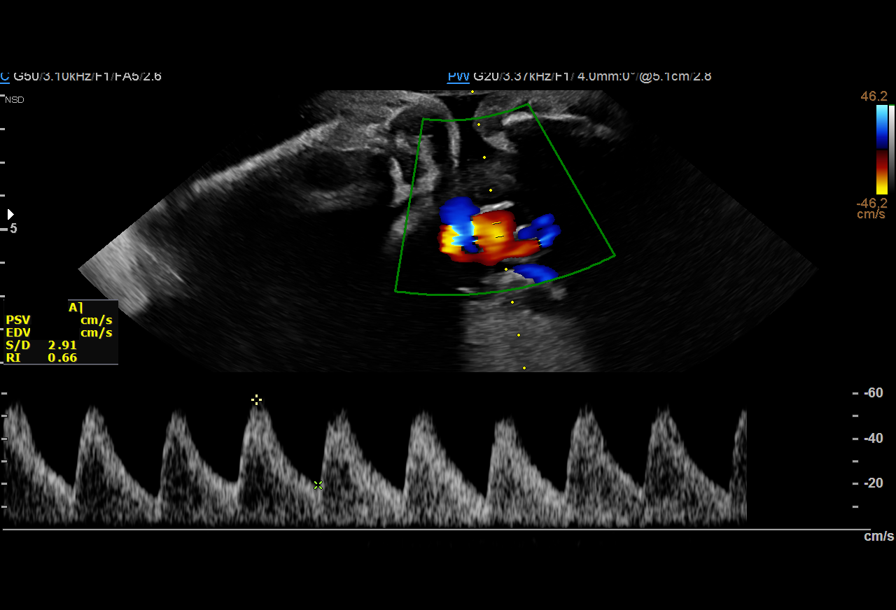
[im 9/10]
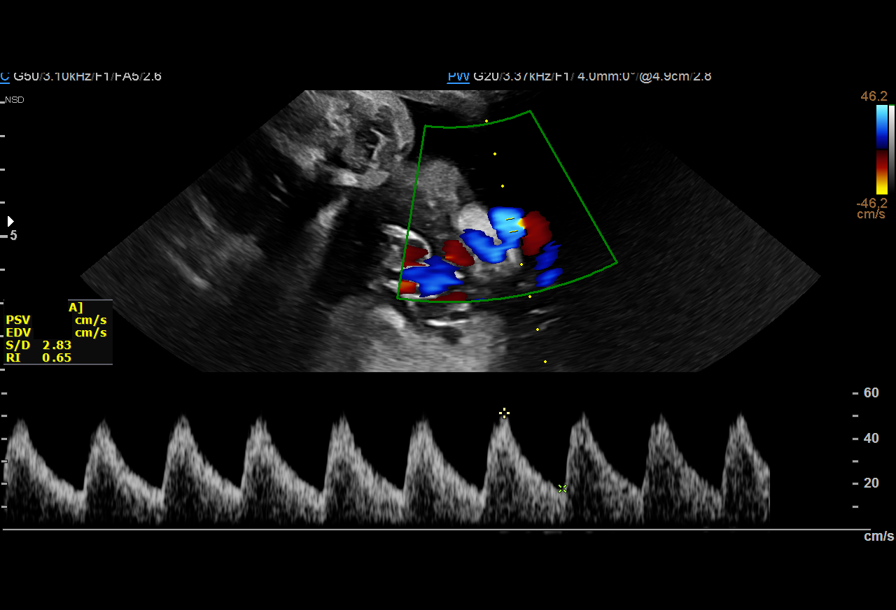
[im 10/10]
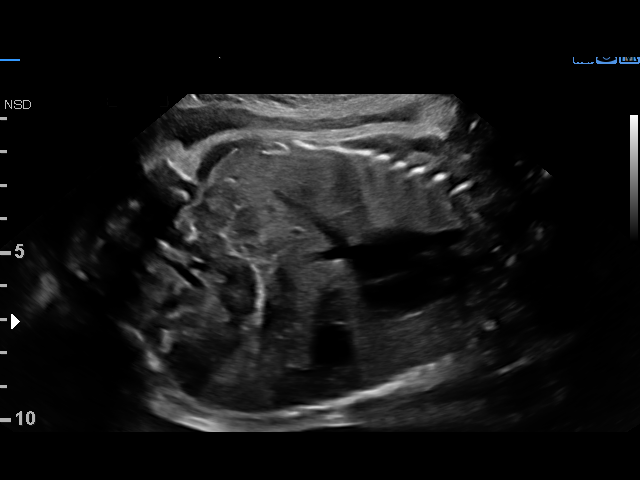

[10 of 10 positions shown; findings below may reference images not displayed]

OB/Gyn Clinic

W/NONSTRESS
2  US MFM UA CORD DOPPLER               76820.02     KAYLEE KUN

Indications

Small for gestational age fetus affecting
management of mother
34 weeks gestation of pregnancy
Other mental disorder complicating
pregnancy, third trimester - schizophrenia,
bipolar, anxiety
Cocaine abuse
Smoking complicating pregnancy, third
trimester
HIV affecting pregnancy, third trimester
History of congenital or genetic condition -
NTD surgically repaired
History of cesarean delivery, currently
pregnant - VBAC
Poor obstetric history: Previous preterm
delivery, antepartum - 29 and 35 weeks
Poor obstetric history: Previous gestational
diabetes
Vital Signs

Height:        5'4"
Fetal Evaluation

Num Of Fetuses:         1
Fetal Heart Rate(bpm):  144
Cardiac Activity:       Observed
Presentation:           Cephalic

Amniotic Fluid
AFI FV:      Within normal limits

AFI Sum(cm)     %Tile       Largest Pocket(cm)
14.42           51

RUQ(cm)       RLQ(cm)       LUQ(cm)        LLQ(cm)
5.47
Biophysical Evaluation

Amniotic F.V:   Within normal limits       F. Tone:        Observed
F. Movement:    Observed                   N.S.T:          Reactive
F. Breathing:   Observed                   Score:          [DATE]
Gestational Age

Best:          34w 1d     Det. By:  U/S C R L  (01/05/18)    EDD:   07/13/18
Doppler - Fetal Vessels

Umbilical Artery
S/D     %tile                                            ADFV    RDFV
2.9       68                                                No      No

Comments

U/S images reviewed. Findings reviewed with patient.   No
evidence of fetal compromise is found on BPP today.  No
fetal abnormalities are seen.  Normal  UA interrogation.
Questions answered.
10 minutes spent face to face with patient.
Recommendations: 1) Weekly NST/BPP/UA dopplers 2)
Follow-up growth in 1 week
Recommendations

1) Weekly NST/BPP/UA dopplers 2) Follow-up growth in 1
week

## 2020-08-26 ENCOUNTER — Ambulatory Visit: Payer: Medicaid Other | Admitting: Pharmacist

## 2020-08-28 ENCOUNTER — Ambulatory Visit: Payer: Medicaid Other | Admitting: Pharmacist

## 2020-09-25 ENCOUNTER — Other Ambulatory Visit: Payer: Self-pay | Admitting: Internal Medicine

## 2020-09-25 DIAGNOSIS — B20 Human immunodeficiency virus [HIV] disease: Secondary | ICD-10-CM

## 2020-09-26 ENCOUNTER — Telehealth: Payer: Self-pay

## 2020-09-26 ENCOUNTER — Ambulatory Visit: Payer: Medicaid Other | Admitting: Internal Medicine

## 2020-09-26 NOTE — Telephone Encounter (Signed)
Received refill request for Biktarvy and Pifeltro. Patient no longer pregnant. Attempted to reach for appointment, "phone not in service"  Routing to provider if ok to approve refill. Tamara Briggs

## 2020-10-02 ENCOUNTER — Other Ambulatory Visit: Payer: Self-pay

## 2020-10-02 DIAGNOSIS — B20 Human immunodeficiency virus [HIV] disease: Secondary | ICD-10-CM

## 2020-10-02 MED ORDER — BIKTARVY 50-200-25 MG PO TABS: 1.0000 | ORAL_TABLET | Freq: Every day | ORAL | 0 refills | Status: DC

## 2020-10-03 ENCOUNTER — Telehealth: Payer: Self-pay

## 2020-10-03 NOTE — Telephone Encounter (Signed)
Received call from patient. She has missed a few appointments and needs refills on her Biktarvy and Pifeltro. RN informed her that refills were sent in yesterday, but that she needs to keep her upcoming appointment in order to continue getting refills. RN asked if there was anything our office could do to help her keep her appointments. She states she needs help with transportation, front desk will help schedule transportation for the patient's upcoming appointment. Patient verbalized understanding and has no further questions.   Sandie Ano, RN

## 2020-10-04 NOTE — Telephone Encounter (Signed)
Thank you. Refills sent. Will reach out to [facility]. Valarie Cones

## 2020-10-04 NOTE — Telephone Encounter (Signed)
Yes, please refill. And see if she can come. Maybe ask mitch to do a drive by? To relay info.

## 2020-10-10 ENCOUNTER — Other Ambulatory Visit: Payer: Self-pay | Admitting: Internal Medicine

## 2020-10-11 ENCOUNTER — Ambulatory Visit: Payer: Medicaid Other

## 2020-10-11 ENCOUNTER — Ambulatory Visit: Payer: Medicaid Other | Admitting: Internal Medicine

## 2020-10-11 ENCOUNTER — Telehealth: Payer: Self-pay

## 2020-10-11 LAB — COMPLETE METABOLIC PANEL WITH GFR
AG Ratio: 1.4 (calc) (ref 1.0–2.5)
ALT: 14 U/L (ref 6–29)
AST: 20 U/L (ref 10–30)
Albumin: 4 g/dL (ref 3.6–5.1)
Alkaline phosphatase (APISO): 54 U/L (ref 31–125)
BUN/Creatinine Ratio: 35 (calc) — ABNORMAL HIGH (ref 6–22)
BUN: 28 mg/dL — ABNORMAL HIGH (ref 7–25)
CO2: 32 mmol/L (ref 20–32)
Calcium: 8.5 mg/dL — ABNORMAL LOW (ref 8.6–10.2)
Chloride: 103 mmol/L (ref 98–110)
Creat: 0.8 mg/dL (ref 0.50–1.10)
GFR, Est African American: 111 mL/min/{1.73_m2} (ref >=60)
GFR, Est Non African American: 96 mL/min/{1.73_m2} (ref >=60)
Globulin: 2.9 g/dL (calc) (ref 1.9–3.7)
Glucose, Bld: 67 mg/dL (ref 65–99)
Potassium: 3.7 mmol/L (ref 3.5–5.3)
Sodium: 140 mmol/L (ref 135–146)
Total Bilirubin: 0.2 mg/dL (ref 0.2–1.2)
Total Protein: 6.9 g/dL (ref 6.1–8.1)

## 2020-10-11 LAB — CBC
HCT: 36 % (ref 35.0–45.0)
Hemoglobin: 11.7 g/dL (ref 11.7–15.5)
MCH: 28 pg (ref 27.0–33.0)
MCHC: 32.5 g/dL (ref 32.0–36.0)
MCV: 86.1 fL (ref 80.0–100.0)
MPV: 9.6 fL (ref 7.5–12.5)
Platelets: 156 10*3/uL (ref 140–400)
RBC: 4.18 10*6/uL (ref 3.80–5.10)
RDW: 13.6 % (ref 11.0–15.0)
WBC: 4.1 10*3/uL (ref 3.8–10.8)

## 2020-10-11 LAB — LIPID PANEL
Cholesterol: 117 mg/dL (ref <200)
HDL: 61 mg/dL (ref >=50)
LDL Cholesterol (Calc): 38 mg/dL (calc)
Non-HDL Cholesterol (Calc): 56 mg/dL (calc) (ref <130)
Total CHOL/HDL Ratio: 1.9 (calc) (ref <5.0)
Triglycerides: 95 mg/dL (ref <150)

## 2020-10-11 LAB — TSH: TSH: 2.77 mIU/L

## 2020-10-11 LAB — VITAMIN D 25 HYDROXY (VIT D DEFICIENCY, FRACTURES): Vit D, 25-Hydroxy: 34 ng/mL (ref 30–100)

## 2020-10-11 NOTE — Telephone Encounter (Signed)
Attempted to reach patient due to missed appointment. Left patient a voicemail to contact our office to reschedule.  Patient also due for labs and renew RW [employer]

## 2020-10-16 ENCOUNTER — Telehealth: Payer: Self-pay | Admitting: Internal Medicine

## 2020-10-16 NOTE — Telephone Encounter (Signed)
   Tamara Briggs DOB: 1986/04/10 MRN: [id number]   RIDER WAIVER AND RELEASE OF LIABILITY  For purposes of improving physical access to our facilities, [place] [facility] is pleased to partner with third parties to provide [facility] patients or other authorized individuals the option of convenient, on-demand ground transportation services (the [employer]") through use of the technology service that enables users to request on-demand ground transportation from independent third-party providers.  By opting to use and accept these [employer], I, the undersigned, hereby agree on behalf of myself, and on behalf of any minor child using the [employer] for whom I am the parent or legal guardian, as follows:  1. Science writer provided to me are provided by independent third-party transportation providers who are not [employer] or employees and who are unaffiliated with [employer]. 2. [place] is neither a transportation carrier nor a common or public carrier. 3. [facility] has no control over the quality or safety of the transportation that occurs as a result of the [employer]. 4. [facility] cannot guarantee that any third-party transportation provider will complete any arranged transportation service. 5. [facility] makes no representation, warranty, or guarantee regarding the reliability, timeliness, quality, safety, suitability, or availability of any of the Transport Services or that they will be error free. 6. I fully understand that traveling by vehicle involves risks and dangers of serious bodily injury, including permanent disability, paralysis, and death. I agree, on behalf of myself and on behalf of any minor child using the Transport Services for whom I am the parent or legal guardian, that the entire risk arising out of my use of the [employer] remains solely with me, to the maximum extent permitted under applicable law. 7. The [employer]  are provided "as is" and "as available." [place] disclaims all representations and warranties, express, implied or statutory, not expressly set out in these terms, including the implied warranties of merchantability and fitness for a particular purpose. 8. I hereby waive and release [facility], its agents, employees, officers, directors, representatives, insurers, attorneys, assigns, successors, subsidiaries, and affiliates from any and all past, present, or future claims, demands, liabilities, actions, causes of action, or suits of any kind directly or indirectly arising from acceptance and use of the [employer]. 9. I further waive and release [place] and its affiliates from all present and future liability and responsibility for any injury or death to persons or damages to property caused by or related to the use of the [employer]. 10. I have read this Waiver and Release of Liability, and I understand the terms used in it and their legal significance. This Waiver is freely and voluntarily given with the understanding that my right (as well as the right of any minor child for whom I am the parent or legal guardian using the [employer]) to legal recourse against [place] in connection with the [employer] is knowingly surrendered in return for use of these services.   I attest that I read the consent document to [facility], gave Ms. Beier the opportunity to ask questions and answered the questions asked (if any). I affirm that Florrie Davidow then provided consent for she's participation in this program.     Tamara Briggs

## 2020-11-19 ENCOUNTER — Ambulatory Visit: Payer: Medicaid Other

## 2020-11-19 ENCOUNTER — Other Ambulatory Visit: Payer: Self-pay

## 2020-11-19 ENCOUNTER — Ambulatory Visit (INDEPENDENT_AMBULATORY_CARE_PROVIDER_SITE_OTHER): Payer: Medicaid Other | Admitting: Internal Medicine

## 2020-11-19 ENCOUNTER — Encounter: Payer: Self-pay | Admitting: Internal Medicine

## 2020-11-19 ENCOUNTER — Other Ambulatory Visit (HOSPITAL_COMMUNITY)
Admission: RE | Admit: 2020-11-19 | Discharge: 2020-11-19 | Disposition: A | Discharge status: Home / Self Care | Payer: Medicaid Other | Source: Ambulatory Visit | Attending: Internal Medicine | Admitting: Internal Medicine

## 2020-11-19 VITALS — BP 108/65 | HR 75 | Temp 98.9°F | Wt 91.0 lb

## 2020-11-19 DIAGNOSIS — F209 Schizophrenia, unspecified: Secondary | ICD-10-CM

## 2020-11-19 DIAGNOSIS — B20 Human immunodeficiency virus [HIV] disease: Secondary | ICD-10-CM | POA: Insufficient documentation

## 2020-11-19 DIAGNOSIS — R21 Rash and other nonspecific skin eruption: Secondary | ICD-10-CM | POA: Diagnosis not present

## 2020-11-19 DIAGNOSIS — Z79899 Other long term (current) drug therapy: Secondary | ICD-10-CM

## 2020-11-19 MED ORDER — BIKTARVY 50-200-25 MG PO TABS: 1.0000 | ORAL_TABLET | Freq: Every day | ORAL | 11 refills | Status: DC

## 2020-11-19 MED ORDER — DORAVIRINE 100 MG PO TABS: 100.0000 mg | ORAL_TABLET | Freq: Every day | ORAL | 11 refills | Status: DC

## 2020-11-19 NOTE — Progress Notes (Signed)
Patient ID: Tamara Briggs, female   DOB: 10-15-1985, 35 y.o.   MRN: [id number]  HPI Tamara Briggs is a 35 yo F with HIV disease, schizophrenia/bipolar disorder, hx of cocaine abuse,  [id number] had spontaneous delivery of baby boy with apgars of 8, 9. Weight of 2.195kg  at [redacted]w[redacted]d on8/09/2019. Patient's labor course c/b +cocaine on tox screen. Also refused IV access, no AZT given to baby during labor. VL 30 taking biktarvy and pilfetro. Infant given to CPS.   She now is living on her own in apt, in [place]    Taking biktarvy and pilfetro daily. Mental health meds make her sleep.  has had 2 doses of pfizer covid vaccine  No other health problems, but has had difficulty with getting access to food/food insecurity    Outpatient Encounter Medications as of 11/19/2020  Medication Sig  . acetaminophen (TYLENOL) 325 MG tablet Take 2 tablets (650 mg total) by mouth every 4 (four) hours as needed.  . bictegravir-emtricitabine-tenofovir AF (BIKTARVY) 50-200-25 MG TABS tablet Take 1 tablet by mouth daily.  Marland Kitchen ibuprofen (ADVIL) 800 MG tablet Take 1 tablet (800 mg total) by mouth every 8 (eight) hours as needed.  Marland Kitchen LATUDA 40 MG TABS tablet TAKE one tablet BY MOUTH EVERY EVENING with meals  . norethindrone (ORTHO MICRONOR) 0.35 MG tablet Take 1 tablet (0.35 mg total) by mouth daily.  Marland Kitchen PIFELTRO 100 MG TABS tablet Take 1 tablet (100 mg total) by mouth daily.  . prenatal vitamin w/FE, FA (PRENATAL 1 + 1) 27-1 MG TABS tablet Take 1 tablet by mouth daily at 12 noon.  . sertraline (ZOLOFT) 50 MG tablet Take 1 tablet (50 mg total) by mouth daily.  . benztropine (COGENTIN) 0.5 MG tablet Take 0.5 mg by mouth 2 (two) times daily.  . megestrol (MEGACE) 40 MG tablet Take 40 mg by mouth daily.   No facility-administered encounter medications on file as of 11/19/2020.     Patient Active Problem List   Diagnosis Date Noted  . Normal labor 03/25/2020  . Genetic carrier status 03/15/2020  . Alpha thalassemia silent  carrier 03/15/2020  . Advanced maternal age in multigravida, third trimester 03/01/2020  . Previous cesarean section complicating pregnancy 03/01/2020  . Supervision of high risk pregnancy, antepartum 10/31/2019  . Routine screening for STI (sexually transmitted infection) 07/14/2018  . Encounter for contraceptive management 07/14/2018  . Healthcare maintenance 07/14/2018  . Pregnancy affected by fetal growth restriction 04/29/18  . Neonatal death 03/25/18  . 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 HIV affecting pregnancy, antepartum 01/19/2018  . History of gestational diabetes mellitus (GDM) in prior pregnancy, currently pregnant 01/19/2018  . Pre-diabetes 01/13/2018  . Protein-calorie malnutrition, severe (HCC) 11/24/2017  . Crack cocaine use   . Bipolar disorder (HCC)   . History of VBAC 08/31/2016  . Late prenatal care affecting pregnancy in third trimester 08/06/2016  . 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 Tardive dyskinesia 06/13/2014  . History of loop electrical excision procedure (LEEP) 12/25/2013  . 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 Schizophrenia (HCC) 10/18/2013  . Human immunodeficiency virus (HIV) disease (HCC) 07/27/2011     Health Maintenance Due  Topic Date Due  . COVID-19 Vaccine (1) Never done  . PAP SMEAR-Modifier  07/08/2018  . INFLUENZA VACCINE  03-25-2020     Review of Systems  Constitutional: Negative for fever, chills, diaphoresis, activity change, appetite change, fatigue and unexpected weight change.  HENT: Negative for congestion, sore throat, rhinorrhea, sneezing, trouble  swallowing and sinus pressure.  Eyes: Negative for photophobia and visual disturbance.  Respiratory: Negative for cough, chest tightness, shortness of breath, wheezing and stridor.  Cardiovascular: Negative for chest pain, palpitations and leg swelling.  Gastrointestinal: Negative for nausea, vomiting, abdominal pain, diarrhea, constipation, blood in stool,  abdominal distention and anal bleeding.  Genitourinary: Negative for dysuria, hematuria, flank pain and difficulty urinating.  Musculoskeletal: Negative for myalgias, back pain, joint swelling, arthralgias and gait problem.  Skin: Negative for color change, pallor, rash and wound.  Neurological: Negative for dizziness, tremors, weakness and light-headedness.  Hematological: Negative for adenopathy. Does not bruise/bleed easily.  Psychiatric/Behavioral: Negative for behavioral problems, confusion, sleep disturbance, dysphoric mood, decreased concentration and agitation.    Physical Exam   BP 108/65   Pulse 75   Temp 98.9 F (37.2 C) (Oral)   Wt 91 lb (41.3 kg)   BMI 15.62 kg/m   Physical Exam  Constitutional:  oriented to person, place, and time. appears well-developed and well-nourished. No distress.  HENT: [place]/AT, PERRLA, no scleral icterus Mouth/Throat: Oropharynx is clear and moist. No oropharyngeal exudate.  Cardiovascular: Normal rate, regular rhythm and normal heart sounds. Exam reveals no gallop and no friction rub.  No murmur heard.  Pulmonary/Chest: Effort normal and breath sounds normal. No respiratory distress.  has no wheezes.  Neck = supple, no nuchal rigidity Abdominal: Soft. Bowel sounds are normal.  exhibits no distension. There is no tenderness.  Lymphadenopathy: no cervical adenopathy. No axillary adenopathy Neurological: alert and oriented to person, place, and time. No akithesisa Skin: Skin is warm and dry. No rash noted. No erythema.  Psychiatric: a normal mood and affect.  behavior is normal.   Lab Results  Component Value Date   CD4TCELL 34 02/21/2020   Lab Results  Component Value Date   CD4TABS 428 02/21/2020   CD4TABS 331 (L) 11/30/2019   CD4TABS 620 02/20/2019   Lab Results  Component Value Date   HIV1RNAQUANT 30 03/26/2020   Lab Results  Component Value Date   HEPBSAB POS (A) 07/09/2011   Lab Results  Component Value Date   LABRPR Non  Reactive 02/29/2020    CBC Lab Results  Component Value Date   WBC 4.1 10/10/2020   RBC 4.18 10/10/2020   HGB 11.7 10/10/2020   HCT 36.0 10/10/2020   PLT 156 10/10/2020   MCV 86.1 10/10/2020   MCH 28.0 10/10/2020   MCHC 32.5 10/10/2020   RDW 13.6 10/10/2020   LYMPHSABS 1.4 02/29/2020   MONOABS 432 04/06/2017   EOSABS 0.1 02/29/2020    BMET Lab Results  Component Value Date   NA 140 10/10/2020   K 3.7 10/10/2020   CL 103 10/10/2020   CO2 32 10/10/2020   GLUCOSE 67 10/10/2020   BUN 28 (H) 10/10/2020   CREATININE 0.80 10/10/2020   CALCIUM 8.5 (L) 10/10/2020   GFRNONAA 96 10/10/2020   GFRAA 111 10/10/2020      Assessment and Plan  hiv disease = will give refills and check labs  Long term medication management = will check cr to ensure at baseline  Health maintenance = pfizer has had 2 doses. Can get booster vaccine  Needs bus pass and food  Birth control = she Takes OBP but wants to take injectable depo. Does not want implant  Rash to AC= Hydrocortisone cream for rash

## 2020-11-20 LAB — T-HELPER CELL (CD4) - (RCID CLINIC ONLY)
CD4 % Helper T Cell: 39 % (ref 33–65)
CD4 T Cell Abs: 730 /uL (ref 400–1790)

## 2020-11-21 LAB — CBC WITH DIFFERENTIAL/PLATELET
Absolute Monocytes: 380 cells/uL (ref 200–950)
Basophils Absolute: 31 cells/uL (ref 0–200)
Basophils Relative: 0.6 %
Eosinophils Absolute: 172 cells/uL (ref 15–500)
Eosinophils Relative: 3.3 %
HCT: 40.8 % (ref 35.0–45.0)
Hemoglobin: 13.1 g/dL (ref 11.7–15.5)
Lymphs Abs: 2059 cells/uL (ref 850–3900)
MCH: 27.2 pg (ref 27.0–33.0)
MCHC: 32.1 g/dL (ref 32.0–36.0)
MCV: 84.8 fL (ref 80.0–100.0)
MPV: 9.4 fL (ref 7.5–12.5)
Monocytes Relative: 7.3 %
Neutro Abs: 2558 cells/uL (ref 1500–7800)
Neutrophils Relative %: 49.2 %
Platelets: 238 10*3/uL (ref 140–400)
RBC: 4.81 10*6/uL (ref 3.80–5.10)
RDW: 13.2 % (ref 11.0–15.0)
Total Lymphocyte: 39.6 %
WBC: 5.2 10*3/uL (ref 3.8–10.8)

## 2020-11-21 LAB — URINE CYTOLOGY ANCILLARY ONLY
Chlamydia: NEGATIVE
Comment: NEGATIVE
Comment: NORMAL
Neisseria Gonorrhea: NEGATIVE

## 2020-11-21 LAB — HIV-1 RNA QUANT-NO REFLEX-BLD
HIV 1 RNA Quant: <20 Copies/mL — ABNORMAL HIGH
HIV-1 RNA Quant, Log: <1.3 Log cps/mL — ABNORMAL HIGH

## 2020-11-21 LAB — LIPID PANEL
Cholesterol: 115 mg/dL (ref <200)
HDL: 68 mg/dL (ref >=50)
LDL Cholesterol (Calc): 32 mg/dL (calc)
Non-HDL Cholesterol (Calc): 47 mg/dL (calc) (ref <130)
Total CHOL/HDL Ratio: 1.7 (calc) (ref <5.0)
Triglycerides: 72 mg/dL (ref <150)

## 2020-11-21 LAB — COMPLETE METABOLIC PANEL WITH GFR
AG Ratio: 1.3 (calc) (ref 1.0–2.5)
ALT: 14 U/L (ref 6–29)
AST: 18 U/L (ref 10–30)
Albumin: 4.5 g/dL (ref 3.6–5.1)
Alkaline phosphatase (APISO): 53 U/L (ref 31–125)
BUN: 20 mg/dL (ref 7–25)
CO2: 27 mmol/L (ref 20–32)
Calcium: 10.2 mg/dL (ref 8.6–10.2)
Chloride: 102 mmol/L (ref 98–110)
Creat: 0.88 mg/dL (ref 0.50–1.10)
GFR, Est African American: 99 mL/min/{1.73_m2} (ref >=60)
GFR, Est Non African American: 85 mL/min/{1.73_m2} (ref >=60)
Globulin: 3.4 g/dL (calc) (ref 1.9–3.7)
Glucose, Bld: 95 mg/dL (ref 65–99)
Potassium: 4 mmol/L (ref 3.5–5.3)
Sodium: 138 mmol/L (ref 135–146)
Total Bilirubin: 0.3 mg/dL (ref 0.2–1.2)
Total Protein: 7.9 g/dL (ref 6.1–8.1)

## 2020-11-21 LAB — RPR: RPR Ser Ql: NONREACTIVE

## 2020-12-09 ENCOUNTER — Other Ambulatory Visit: Payer: Self-pay

## 2020-12-09 MED ORDER — NORETHINDRONE 0.35 MG PO TABS: 1.0000 | ORAL_TABLET | Freq: Every day | ORAL | 11 refills | Status: DC

## 2021-01-01 ENCOUNTER — Ambulatory Visit: Payer: Medicaid Other | Admitting: Internal Medicine

## 2021-01-16 ENCOUNTER — Ambulatory Visit: Payer: Medicaid Other | Admitting: Internal Medicine

## 2021-01-21 ENCOUNTER — Encounter: Payer: Self-pay | Admitting: Internal Medicine

## 2021-01-21 ENCOUNTER — Ambulatory Visit (INDEPENDENT_AMBULATORY_CARE_PROVIDER_SITE_OTHER): Payer: Medicaid Other | Admitting: Internal Medicine

## 2021-01-21 ENCOUNTER — Other Ambulatory Visit: Payer: Self-pay

## 2021-01-21 VITALS — BP 95/60 | HR 65 | Temp 98.0°F | Wt 88.0 lb

## 2021-01-21 DIAGNOSIS — E440 Moderate protein-calorie malnutrition: Secondary | ICD-10-CM | POA: Diagnosis not present

## 2021-01-21 DIAGNOSIS — Z0000 Encounter for general adult medical examination without abnormal findings: Secondary | ICD-10-CM | POA: Diagnosis present

## 2021-01-21 DIAGNOSIS — B20 Human immunodeficiency virus [HIV] disease: Secondary | ICD-10-CM | POA: Diagnosis not present

## 2021-01-21 MED ORDER — ENSURE PO LIQD: 237.0000 mL | Freq: Two times a day (BID) | ORAL | 11 refills | Status: AC

## 2021-01-21 NOTE — Progress Notes (Signed)
RFV: follow up for hiv disease  Patient ID: Tamara Briggs, female   DOB: 1986-04-12, 35 y.o.   MRN: [id number]  HPI Tamara Briggs is a 35yo F, cd 4 count of 730/VL<20 in end of march on biktarvy-pilfetro  Missed 2 days  Since she was at her grandmother's house. Otherwise, she is doing   She is sleepy today. She is not currently taking any OBC pills and her period started yesterday.   Outpatient Encounter Medications as of 01/21/2021  Medication Sig  . acetaminophen (TYLENOL) 325 MG tablet Take 2 tablets (650 mg total) by mouth every 4 (four) hours as needed.  . benztropine (COGENTIN) 0.5 MG tablet Take 0.5 mg by mouth 2 (two) times daily.  . bictegravir-emtricitabine-tenofovir AF (BIKTARVY) 50-200-25 MG TABS tablet Take 1 tablet by mouth daily.  . doravirine (PIFELTRO) 100 MG TABS tablet Take 1 tablet (100 mg total) by mouth daily.  Marland Kitchen LATUDA 40 MG TABS tablet TAKE one tablet BY MOUTH EVERY EVENING with meals  . megestrol (MEGACE) 40 MG tablet Take 40 mg by mouth daily.  . sertraline (ZOLOFT) 50 MG tablet Take 1 tablet (50 mg total) by mouth daily.  . norethindrone (ORTHO MICRONOR) 0.35 MG tablet Take 1 tablet (0.35 mg total) by mouth daily. (Patient not taking: Reported on 01/21/2021)   No facility-administered encounter medications on file as of 01/21/2021.     Patient Active Problem List   Diagnosis Date Noted  . Normal labor 03/25/2020  . Genetic carrier status 03/15/2020  . Alpha thalassemia silent carrier 03/15/2020  . Advanced maternal age in multigravida, third trimester 03/01/2020  . Previous cesarean section complicating pregnancy 03/01/2020  . Supervision of high risk pregnancy, antepartum 10/31/2019  . Routine screening for STI (sexually transmitted infection) 07/14/2018  . Encounter for contraceptive management 07/14/2018  . Healthcare maintenance 07/14/2018  . Pregnancy affected by fetal growth restriction 05-01-18  . Neonatal death Mar 27, 2018  . Insulin resistance  01/28/2018  . HIV affecting pregnancy, antepartum 01/19/2018  . History of gestational diabetes mellitus (GDM) in prior pregnancy, currently pregnant 01/19/2018  . Pre-diabetes 01/13/2018  . Protein-calorie malnutrition, severe (HCC) 11/24/2017  . Crack cocaine use   . Bipolar disorder (HCC)   . History of VBAC 08/31/2016  . Late prenatal care affecting pregnancy in third trimester 08/06/2016  . 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 Tardive dyskinesia 06/13/2014  . History of loop electrical excision procedure (LEEP) 12/25/2013  . 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 Schizophrenia (HCC) 10/18/2013  . Human immunodeficiency virus (HIV) disease (HCC) 07/27/2011     Health Maintenance Due  Topic Date Due  . COVID-19 Vaccine (1) Never done  . PAP SMEAR-Modifier  07/08/2018     Review of Systems Review of Systems  Constitutional: Negative for fever, chills, diaphoresis, activity change, appetite change, fatigue and unexpected weight change.  HENT: Negative for congestion, sore throat, rhinorrhea, sneezing, trouble swallowing and sinus pressure.  Eyes: Negative for photophobia and visual disturbance.  Respiratory: Negative for cough, chest tightness, shortness of breath, wheezing and stridor.  Cardiovascular: Negative for chest pain, palpitations and leg swelling.  Gastrointestinal: Negative for nausea, vomiting, abdominal pain, diarrhea, constipation, blood in stool, abdominal distention and anal bleeding.  Genitourinary: Negative for dysuria, hematuria, flank pain and difficulty urinating.  Musculoskeletal: Negative for myalgias, back pain, joint swelling, arthralgias and gait problem.  Skin: Negative for color change, pallor, rash and wound.  Neurological: Negative for dizziness, tremors, weakness and light-headedness.  Hematological: Negative for adenopathy.  Does not bruise/bleed easily.  Psychiatric/Behavioral: Negative for behavioral problems, confusion, sleep  disturbance, dysphoric mood, decreased concentration and agitation.    Physical Exam   BP 95/60   Pulse 65   Temp 98 F (36.7 C) (Oral)   Wt 88 lb (39.9 kg)   SpO2 98%   BMI 15.11 kg/m   Physical Exam  Constitutional:  oriented to person, place, and time. appears well-developed and well-nourished. No distress.  HENT: [place]/AT, PERRLA, no scleral icterus Mouth/Throat: Oropharynx is clear and moist. No oropharyngeal exudate.  Cardiovascular: Normal rate, regular rhythm and normal heart sounds. Exam reveals no gallop and no friction rub.  No murmur heard.  Pulmonary/Chest: Effort normal and breath sounds normal. No respiratory distress.  has no wheezes.  Neck = supple, no nuchal rigidity Abdominal: Soft. Bowel sounds are normal.  exhibits no distension. There is no tenderness.  Lymphadenopathy: no cervical adenopathy. No axillary adenopathy Neurological: alert and oriented to person, place, and time.  Skin: Skin is warm and dry. No rash noted. No erythema.  Psychiatric: a normal mood and affect.  behavior is normal.   Lab Results  Component Value Date   CD4TCELL 39 11/19/2020   Lab Results  Component Value Date   CD4TABS 730 11/19/2020   CD4TABS 428 02/21/2020   CD4TABS 331 (L) 11/30/2019   Lab Results  Component Value Date   HIV1RNAQUANT <20 (H) 11/19/2020   Lab Results  Component Value Date   HEPBSAB POS (A) 07/09/2011   Lab Results  Component Value Date   LABRPR NON-REACTIVE 11/19/2020    CBC Lab Results  Component Value Date   WBC 5.2 11/19/2020   RBC 4.81 11/19/2020   HGB 13.1 11/19/2020   HCT 40.8 11/19/2020   PLT 238 11/19/2020   MCV 84.8 11/19/2020   MCH 27.2 11/19/2020   MCHC 32.1 11/19/2020   RDW 13.2 11/19/2020   LYMPHSABS 2,059 11/19/2020   MONOABS 432 04/06/2017   EOSABS 172 11/19/2020    BMET Lab Results  Component Value Date   NA 138 11/19/2020   K 4.0 11/19/2020   CL 102 11/19/2020   CO2 27 11/19/2020   GLUCOSE 95 11/19/2020   BUN  20 11/19/2020   CREATININE 0.88 11/19/2020   CALCIUM 10.2 11/19/2020   GFRNONAA 85 11/19/2020   GFRAA 99 11/19/2020      Assessment and Plan  Health maintenance = Only had 1 dose of pfizer vaccine a while  Ago. Willing for [place] to arrange for pfizer vaccine and depo shot  Women's health = Needs pap smear  Moderate protein-calorie malnutrition = Food insecurity = will give Ensure since BMI 15  HIV disease= continue on biktarvy-plifero

## 2021-01-28 ENCOUNTER — Ambulatory Visit: Payer: Medicaid Other

## 2021-01-28 ENCOUNTER — Ambulatory Visit: Payer: Medicaid Other | Admitting: Infectious Diseases

## 2021-02-04 ENCOUNTER — Other Ambulatory Visit: Payer: Self-pay | Admitting: Infectious Diseases

## 2021-02-04 DIAGNOSIS — B20 Human immunodeficiency virus [HIV] disease: Secondary | ICD-10-CM

## 2021-02-13 ENCOUNTER — Ambulatory Visit: Payer: Medicaid Other

## 2021-02-19 ENCOUNTER — Other Ambulatory Visit: Payer: Self-pay | Admitting: Family

## 2021-02-19 DIAGNOSIS — B20 Human immunodeficiency virus [HIV] disease: Secondary | ICD-10-CM

## 2021-02-26 ENCOUNTER — Ambulatory Visit: Payer: Medicaid Other | Admitting: Infectious Diseases

## 2021-03-18 ENCOUNTER — Ambulatory Visit (INDEPENDENT_AMBULATORY_CARE_PROVIDER_SITE_OTHER): Payer: Medicaid Other | Admitting: Internal Medicine

## 2021-03-18 ENCOUNTER — Other Ambulatory Visit: Payer: Self-pay

## 2021-03-18 DIAGNOSIS — R21 Rash and other nonspecific skin eruption: Secondary | ICD-10-CM | POA: Diagnosis present

## 2021-03-18 DIAGNOSIS — B20 Human immunodeficiency virus [HIV] disease: Secondary | ICD-10-CM | POA: Diagnosis not present

## 2021-03-18 NOTE — Progress Notes (Signed)
Virtual Visit via Telephone Note  I connected with Tamara Briggs on 03/18/21 at  3:30 PM EDT by telephone and verified that I am speaking with the correct person using two identifiers.  Location: Patient: at home Provider: at clinic   I discussed the limitations, risks, security and privacy concerns of performing an evaluation and management service by telephone and the availability of in person appointments. I also discussed with the patient that there may be a patient responsible charge related to this service. The patient expressed understanding and agreed to proceed.   History of Present Illness: 35yo F with history of hiv disease, CD 4 count of 730/VL<20 on biktarvy-doravarine for which she is taking regularly per her report. She denies having recent issues with her health. She is also continues to take her mental health medications. She is unable to come to clinic in person on this visit due to having a class from 6-9pm- and hte time it takes to commute on bus.  ROS: sh has pruritic rash -similar in the past which was eczema like   Current Outpatient Medications:    acetaminophen (TYLENOL) 325 MG tablet, Take 2 tablets (650 mg total) by mouth every 4 (four) hours as needed., Disp: 30 tablet, Rfl: 0   benztropine (COGENTIN) 0.5 MG tablet, Take 0.5 mg by mouth 2 (two) times daily., Disp: , Rfl:    bictegravir-emtricitabine-tenofovir AF (BIKTARVY) 50-200-25 MG TABS tablet, Take 1 tablet by mouth daily., Disp: 30 tablet, Rfl: 11   doravirine (PIFELTRO) 100 MG TABS tablet, Take 1 tablet (100 mg total) by mouth daily., Disp: 30 tablet, Rfl: 11   Ensure (ENSURE), Take 237 mLs by mouth 2 (two) times daily between meals. BMI 15, Disp: 237 mL, Rfl: 11   LATUDA 40 MG TABS tablet, TAKE one tablet BY MOUTH EVERY EVENING with meals, Disp: 30 tablet, Rfl: 2   megestrol (MEGACE) 40 MG tablet, Take 40 mg by mouth daily., Disp: , Rfl:    sertraline (ZOLOFT) 50 MG tablet, Take 1 tablet (50 mg total) by  mouth daily., Disp: 30 tablet, Rfl: 5    Observations/Objective: Fluent speech. Answers questions appropriately  Assessment and Plan: HIV disease = at next visit, have hiv labs drawn.  Eczema rash = will send in combination of hydrocortisone-eucerin cream Health maintenance = recommend to get covid vaccine  Follow Up Instructions: See back in 2-3 months for labs and follow up on her    I discussed the assessment and treatment plan with the patient. The patient was provided an opportunity to ask questions and all were answered. The patient agreed with the plan and demonstrated an understanding of the instructions.   The patient was advised to call back or seek an in-person evaluation if the symptoms worsen or if the condition fails to improve as anticipated.  I provided 10 minutes of non-face-to-face time during this encounter.   Judyann Munson, MD

## 2021-03-25 ENCOUNTER — Other Ambulatory Visit: Payer: Self-pay | Admitting: Family Medicine

## 2021-03-25 DIAGNOSIS — O09521 Supervision of elderly multigravida, first trimester: Secondary | ICD-10-CM

## 2021-04-22 ENCOUNTER — Ambulatory Visit: Payer: Medicaid Other | Admitting: Infectious Diseases

## 2021-04-24 ENCOUNTER — Ambulatory Visit: Payer: Medicaid Other | Admitting: Infectious Diseases

## 2021-05-14 ENCOUNTER — Telehealth: Payer: Self-pay | Admitting: Neurology

## 2021-05-14 NOTE — Telephone Encounter (Signed)
Pt hasn't been seen since 06/14/18, which dr/np would you like to Follow this pt ?

## 2021-05-14 NOTE — Telephone Encounter (Signed)
Pt called wanting an appt, says her symptoms are getting worse and would like an appt. Will call back to reschedule. Where should I schedule her?

## 2021-05-15 NOTE — Telephone Encounter (Signed)
Dr Anne Hahn recommend this pt seeing Dr Marjory Lies or Frances Furbish. Pt scheduled with [facility] 06/09/21 1 pm.

## 2021-05-20 ENCOUNTER — Other Ambulatory Visit (HOSPITAL_COMMUNITY): Payer: Self-pay

## 2021-05-21 ENCOUNTER — Ambulatory Visit: Payer: Medicaid Other | Admitting: Pharmacist

## 2021-06-03 ENCOUNTER — Ambulatory Visit (INDEPENDENT_AMBULATORY_CARE_PROVIDER_SITE_OTHER): Payer: Medicaid Other | Admitting: Pharmacist

## 2021-06-03 ENCOUNTER — Other Ambulatory Visit (HOSPITAL_COMMUNITY): Payer: Self-pay

## 2021-06-03 ENCOUNTER — Other Ambulatory Visit (HOSPITAL_COMMUNITY)
Admission: RE | Admit: 2021-06-03 | Discharge: 2021-06-03 | Disposition: A | Discharge status: Home / Self Care | Payer: Medicaid Other | Source: Ambulatory Visit | Attending: Internal Medicine | Admitting: Internal Medicine

## 2021-06-03 ENCOUNTER — Other Ambulatory Visit: Payer: Self-pay

## 2021-06-03 DIAGNOSIS — B20 Human immunodeficiency virus [HIV] disease: Secondary | ICD-10-CM | POA: Insufficient documentation

## 2021-06-03 DIAGNOSIS — L989 Disorder of the skin and subcutaneous tissue, unspecified: Secondary | ICD-10-CM | POA: Diagnosis not present

## 2021-06-03 MED ORDER — HYDROCORTISONE 2.5 % EX LOTN: TOPICAL_LOTION | Freq: Two times a day (BID) | CUTANEOUS | 3 refills | Status: DC

## 2021-06-03 NOTE — Progress Notes (Signed)
06/03/2021  HPI: Tamara Briggs is a 35 y.o. female who presents to the [facility] pharmacy clinic for HIV follow-up.  Patient Active Problem List   Diagnosis Date Noted   Normal labor 03/25/2020   Genetic carrier status 03/15/2020   Alpha thalassemia silent carrier 03/15/2020   Advanced maternal age in multigravida, third trimester 03/01/2020   Previous cesarean section complicating pregnancy 03/01/2020   Supervision of high risk pregnancy, antepartum 10/31/2019   Routine screening for STI (sexually transmitted infection) 07/14/2018   Encounter for contraceptive management 07/14/2018   Healthcare maintenance 07/14/2018   Pregnancy affected by fetal growth restriction 2018/05/03   Neonatal death March 29, 2018   Insulin resistance 01/28/2018   HIV affecting pregnancy, antepartum 01/19/2018   History of gestational diabetes mellitus (GDM) in prior pregnancy, currently pregnant 01/19/2018   Pre-diabetes 01/13/2018   Protein-calorie malnutrition, severe (HCC) 11/24/2017   Crack cocaine use    Bipolar disorder (HCC)    History of VBAC 08/31/2016   Late prenatal care affecting pregnancy in third trimester 08/06/2016   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   BENZTROPINE (COGENTIN) 0.5 MG TABLET    Take 0.5 mg by mouth 2 (two) times daily.   BICTEGRAVIR-EMTRICITABINE-TENOFOVIR AF (BIKTARVY) 50-200-25 MG TABS TABLET    Take 1 tablet by mouth daily.   DORAVIRINE (PIFELTRO) 100 MG TABS TABLET    Take 1 tablet (100 mg total) by mouth daily.   ENSURE  (ENSURE)    Take 237 mLs by mouth 2 (two) times daily between meals. BMI 15   LATUDA 40 MG TABS TABLET    TAKE one tablet BY MOUTH EVERY EVENING with meals   MEGESTROL (MEGACE) 40 MG TABLET    Take 40 mg by mouth daily.   PRENATAL 27-1 MG TABS    Take 1 tablet by mouth daily at 12 noon.   SERTRALINE (ZOLOFT) 50 MG TABLET    Take 1 tablet (50 mg total) by mouth daily.  Modified Medications   No medications on file  Discontinued Medications   No medications on file    Allergies: No Known Allergies  Past Medical History: Past Medical History:  Diagnosis Date   Anxiety    Bipolar disorder (HCC)    Crack cocaine use    Depression    h/o crack Cocaine abuse 03/15/2013   UDS positive cocaine   History of neural tube defect 11/16/2013   Pt had repair of lower lubar NTD repaired as infant.  AFP increased and sent to genetics.  Amnio declined.  [place] nml.    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   Tardive dyskinesia 06/13/2014   Tobacco abuse 03/15/2013    Social History: Social History   Socioeconomic History   Marital status: Single    Spouse name: Not on file   Number of children: 1   Years of education: 11th   Highest education level: Not on file  Occupational History   Not on file  Tobacco Use   Smoking status: Every Day    Packs/day: 0.30    Years: 3.00    Pack years: 0.90    Types: Cigarettes   Smokeless tobacco: Never   Tobacco comments:    cutting back, 3 cigs/day  Vaping Use   Vaping Use: Never used  Substance and Sexual Activity   Alcohol use: No    Alcohol/week: 0.0 standard drinks   Drug use: Not Currently    Frequency: 1.0 times per week    Types: Cocaine, Marijuana    Comment: cocaine 5/11, mj 2 weeks ago   Sexual activity: Yes    Partners: Male    Birth control/protection: Injection    Comment: declined condoms  Other Topics Concern   Not on  file  Social History Narrative   Patient lives at home with her partner Tamara Briggs.   Unemployed.   Education 11 th grade.   Right handed.   Patient does not drink caffeine.   Social Determinants of Health   Financial Resource Strain: Not on file  Food Insecurity: Not on file  Transportation Needs: Not on file  Physical Activity: Not on file  Stress: Not on file  Social Connections: Not on file    Labs: Lab Results  Component Value Date   HIV1RNAQUANT <20 (H) 11/19/2020   HIV1RNAQUANT 30 03/26/2020   HIV1RNAQUANT <20 DETECTED (A) 02/21/2020   CD4TABS 730 11/19/2020   CD4TABS 428 02/21/2020   CD4TABS 331 (L) 11/30/2019    RPR and STI Lab Results  Component Value Date   LABRPR NON-REACTIVE 11/19/2020   LABRPR Non Reactive 02/29/2020   LABRPR NON-REACTIVE 02/21/2020   LABRPR NON-REACTIVE 11/30/2019   LABRPR Reactive (A) 08/10/2019    STI Results GC GC CT CT  Latest Ref Rng & Units - NEGATIVE - NEGATIVE  11/19/2020 Negative - Negative -  08/10/2019 Negative - Negative -  02/20/2019 Negative - Negative -  07/14/2018 Negative - Negative -  05/27/2018 Negative - Negative -  04/20/2018 Negative - **POSITIVE**(A) -  01/05/2018 Negative - POSITIVE(A) -  06/30/2016 Negative - Negative -  08/28/2014 NG: Negative - CT: Negative -  07/20/2014 - NEGATIVE - POSITIVE(A)  04/24/2014 - NEGATIVE - POSITIVE(A)  12/14/2013 - NEGATIVE Negative NEGATIVE  09/16/2012 - NEGATIVE - POSITIVE(A)  10/23/2007 - - NEGATIVE (NOTE)  Testing performed using the BD Probetec ET Chlamydia trachomatis and Neisseria gonorrhea amplified DNA assay. -  04/04/2007 - - NEGATIVE (NOTE)  Testing performed using the BD Probetec ET Chlamydia trachomatis and Neisseria gonorrhea amplified DNA assay. -    Hepatitis B Lab Results  Component Value Date   HEPBSAB POS (A) 07/09/2011   HEPBSAG Negative 02/29/2020   HEPBCAB NEG 07/09/2011   Hepatitis C No results found for: HEPCAB, HCVRNAPCRQN Hepatitis A Lab Results   Component Value Date   HAV NEG 07/09/2011   Lipids: Lab Results  Component Value Date   CHOL 115 11/19/2020   TRIG 72 11/19/2020   HDL 68 11/19/2020   CHOLHDL 1.7 11/19/2020   VLDL 19 10/31/2015   LDLCALC 32 11/19/2020    Current HIV Regimen: Biktarvy-Doravarine  Assessment: Tamara Briggs presents to clinic today for HIV follow-up. She denies missing any doses and complications from her medications. She is well controlled and presents with no complaints. She still has an eczema like rash similar to her last visit. She states that the Hydrocortisone 2.5% lotion helps with this. Will send in a  new script today at her request. She is interested in receiving the influenza and COVID vaccines, but presents feeling sick with a cold today. She will follow back up to receive her vaccines once she is feeling well. Upon her request, she was provided 2 bus passes and food from the pantry. She is also interested in starting contraceptive therapy to prevent pregnancy. We discussed that we did not have Depo-Provera Injection available in the clinic at this time Will order a HIV RNA, RPR, Urine Cytology, and BMET today.     Plan: Ordered HIV RNA, Urine Cytology, RPR, and BMET Reordered Hydrocortisone 2.5% lotion Follow up with Marchelle Folks on 09/30/21 @ 3:15   Shirlee More, PharmD PGY2 Infectious Diseases Pharmacy Resident

## 2021-06-04 ENCOUNTER — Other Ambulatory Visit: Payer: Self-pay | Admitting: Pharmacist

## 2021-06-04 DIAGNOSIS — B20 Human immunodeficiency virus [HIV] disease: Secondary | ICD-10-CM

## 2021-06-04 DIAGNOSIS — L989 Disorder of the skin and subcutaneous tissue, unspecified: Secondary | ICD-10-CM

## 2021-06-04 LAB — URINE CYTOLOGY ANCILLARY ONLY
Chlamydia: NEGATIVE
Comment: NEGATIVE
Comment: NORMAL
Neisseria Gonorrhea: NEGATIVE

## 2021-06-04 MED ORDER — HYDROCORTISONE 2.5 % EX LOTN: TOPICAL_LOTION | Freq: Two times a day (BID) | CUTANEOUS | 3 refills | Status: DC

## 2021-06-05 LAB — BASIC METABOLIC PANEL
BUN: 24 mg/dL (ref 7–25)
CO2: 32 mmol/L (ref 20–32)
Calcium: 9.1 mg/dL (ref 8.6–10.2)
Chloride: 103 mmol/L (ref 98–110)
Creat: 0.64 mg/dL (ref 0.50–0.97)
Glucose, Bld: 76 mg/dL (ref 65–99)
Potassium: 4.1 mmol/L (ref 3.5–5.3)
Sodium: 141 mmol/L (ref 135–146)

## 2021-06-05 LAB — HIV-1 RNA QUANT-NO REFLEX-BLD
HIV 1 RNA Quant: 63 Copies/mL — ABNORMAL HIGH
HIV-1 RNA Quant, Log: 1.8 Log cps/mL — ABNORMAL HIGH

## 2021-06-05 LAB — RPR: RPR Ser Ql: NONREACTIVE

## 2021-06-09 ENCOUNTER — Ambulatory Visit: Payer: Medicaid Other | Admitting: Diagnostic Neuroimaging

## 2021-06-09 ENCOUNTER — Encounter: Payer: Self-pay | Admitting: Diagnostic Neuroimaging

## 2021-06-16 ENCOUNTER — Telehealth: Payer: Self-pay

## 2021-06-16 NOTE — Telephone Encounter (Signed)
Thank you for letting me know. We do have a 4 month follow-up with her in February as she came to see me earlier this month. I see she is still not picking up her medicine regardless though. Tamara Briggs

## 2021-06-16 NOTE — Telephone Encounter (Signed)
Attempted to reach patient to schedule follow up visit with provider and pharmacy. Not answered and did not leave a message as patient has unsecured voicemail set up. Patient also being follow by case management team with [facility]. Team updated for need to scheduling appt. Patient also need to update Flu shot.   Valarie Cones

## 2021-08-04 ENCOUNTER — Ambulatory Visit: Payer: Medicaid Other | Admitting: Diagnostic Neuroimaging

## 2021-09-29 ENCOUNTER — Encounter: Payer: Self-pay | Admitting: Diagnostic Neuroimaging

## 2021-09-29 ENCOUNTER — Ambulatory Visit: Payer: Medicaid Other | Admitting: Diagnostic Neuroimaging

## 2021-09-30 ENCOUNTER — Ambulatory Visit: Payer: Medicaid Other | Admitting: Pharmacist

## 2021-10-08 ENCOUNTER — Ambulatory Visit: Payer: Medicaid Other | Admitting: Pharmacist

## 2021-10-10 ENCOUNTER — Ambulatory Visit: Payer: Medicaid Other | Admitting: Pharmacist

## 2021-12-10 ENCOUNTER — Other Ambulatory Visit: Payer: Self-pay | Admitting: Family Medicine

## 2021-12-17 ENCOUNTER — Ambulatory Visit: Payer: Medicaid Other | Admitting: Pharmacist

## 2022-02-16 ENCOUNTER — Telehealth: Payer: Self-pay

## 2022-02-20 ENCOUNTER — Ambulatory Visit: Payer: Commercial Managed Care - HMO | Admitting: Pharmacist

## 2022-03-03 ENCOUNTER — Ambulatory Visit: Payer: Commercial Managed Care - HMO | Admitting: Pharmacist

## 2022-03-06 ENCOUNTER — Other Ambulatory Visit: Payer: Self-pay | Admitting: Internal Medicine

## 2022-03-06 DIAGNOSIS — B20 Human immunodeficiency virus [HIV] disease: Secondary | ICD-10-CM

## 2022-03-09 ENCOUNTER — Telehealth: Payer: Self-pay

## 2022-03-09 NOTE — Telephone Encounter (Signed)
Received refill request for Biktarvy. Per chart review, has not been filled since 06/2021 and multiple no-shows. Called patient to reschedule missed appointment, no answer and unable to leave message.   Sandie Ano, RN

## 2022-03-09 NOTE — Telephone Encounter (Signed)
Has not been filled since 06/2021 - patient needs appointment.   Sandie Ano, RN

## 2022-03-12 ENCOUNTER — Telehealth: Payer: Self-pay

## 2022-03-12 NOTE — Telephone Encounter (Signed)
Patient called office stating she has been off medication for one week now. Is requesting refills. Patient has multiple no shows and is overdue for an appt . Scheduled to see pharmacy team next week. Is requesting bus passes.  Updated address and phone number. Understands she will need to come to appt for refills.  Will mail patient bus passes. Confirmed new address before ending call [name], RMA

## 2022-03-17 ENCOUNTER — Ambulatory Visit: Payer: Commercial Managed Care - HMO | Admitting: Pharmacist

## 2022-03-18 ENCOUNTER — Other Ambulatory Visit: Payer: Commercial Managed Care - HMO

## 2022-03-18 ENCOUNTER — Other Ambulatory Visit (HOSPITAL_COMMUNITY)
Admission: RE | Admit: 2022-03-18 | Discharge: 2022-03-18 | Disposition: A | Discharge status: Home / Self Care | Payer: Commercial Managed Care - HMO | Source: Ambulatory Visit | Attending: Internal Medicine | Admitting: Internal Medicine

## 2022-03-18 ENCOUNTER — Ambulatory Visit: Payer: Commercial Managed Care - HMO | Admitting: Pharmacist

## 2022-03-18 ENCOUNTER — Other Ambulatory Visit: Payer: Self-pay | Admitting: Pharmacist

## 2022-03-18 ENCOUNTER — Other Ambulatory Visit: Payer: Self-pay

## 2022-03-18 DIAGNOSIS — Z113 Encounter for screening for infections with a predominantly sexual mode of transmission: Secondary | ICD-10-CM | POA: Diagnosis present

## 2022-03-18 DIAGNOSIS — Z0000 Encounter for general adult medical examination without abnormal findings: Secondary | ICD-10-CM

## 2022-03-18 DIAGNOSIS — B20 Human immunodeficiency virus [HIV] disease: Secondary | ICD-10-CM

## 2022-03-18 NOTE — Progress Notes (Signed)
Patient showed up 40 minutes late for appointment. I was unable to see her today but will send her for lab work and follow up in 2 weeks.  Tamara Briggs L. Tamara Briggs, PharmD [facility] Clinical Pharmacist Practitioner

## 2022-03-20 LAB — T-HELPER CELL (CD4) - (RCID CLINIC ONLY)
CD4 % Helper T Cell: 36 % (ref 33–65)
CD4 T Cell Abs: 451 /uL (ref 400–1790)

## 2022-03-20 LAB — URINE CYTOLOGY ANCILLARY ONLY
Chlamydia: NEGATIVE
Comment: NEGATIVE
Comment: NORMAL
Neisseria Gonorrhea: NEGATIVE

## 2022-03-27 LAB — CBC WITH DIFFERENTIAL/PLATELET
Absolute Monocytes: 342 cells/uL (ref 200–950)
Basophils Absolute: 9 cells/uL (ref 0–200)
Basophils Relative: 0.3 %
Eosinophils Absolute: 51 cells/uL (ref 15–500)
Eosinophils Relative: 1.7 %
HCT: 37 % (ref 35.0–45.0)
Hemoglobin: 12.3 g/dL (ref 11.7–15.5)
Lymphs Abs: 1416 cells/uL (ref 850–3900)
MCH: 27.2 pg (ref 27.0–33.0)
MCHC: 33.2 g/dL (ref 32.0–36.0)
MCV: 81.9 fL (ref 80.0–100.0)
MPV: 9.7 fL (ref 7.5–12.5)
Monocytes Relative: 11.4 %
Neutro Abs: 1182 cells/uL — ABNORMAL LOW (ref 1500–7800)
Neutrophils Relative %: 39.4 %
Platelets: 162 10*3/uL (ref 140–400)
RBC: 4.52 10*6/uL (ref 3.80–5.10)
RDW: 13.2 % (ref 11.0–15.0)
Total Lymphocyte: 47.2 %
WBC: 3 10*3/uL — ABNORMAL LOW (ref 3.8–10.8)

## 2022-03-27 LAB — COMPREHENSIVE METABOLIC PANEL
AG Ratio: 1.2 (calc) (ref 1.0–2.5)
ALT: 18 U/L (ref 6–29)
AST: 22 U/L (ref 10–30)
Albumin: 3.9 g/dL (ref 3.6–5.1)
Alkaline phosphatase (APISO): 60 U/L (ref 31–125)
BUN: 20 mg/dL (ref 7–25)
CO2: 28 mmol/L (ref 20–32)
Calcium: 8.4 mg/dL — ABNORMAL LOW (ref 8.6–10.2)
Chloride: 104 mmol/L (ref 98–110)
Creat: 0.62 mg/dL (ref 0.50–0.97)
Globulin: 3.3 g/dL (calc) (ref 1.9–3.7)
Glucose, Bld: 90 mg/dL (ref 65–99)
Potassium: 3.8 mmol/L (ref 3.5–5.3)
Sodium: 138 mmol/L (ref 135–146)
Total Bilirubin: 0.2 mg/dL (ref 0.2–1.2)
Total Protein: 7.2 g/dL (ref 6.1–8.1)

## 2022-03-27 LAB — HIV-1 INTEGRASE GENOTYPE

## 2022-03-27 LAB — HIV-1 GENOTYPE: HIV-1 Genotype: DETECTED — AB

## 2022-03-27 LAB — RPR: RPR Ser Ql: NONREACTIVE

## 2022-03-27 LAB — HIV RNA, RTPCR W/R GT (RTI, PI,INT)
HIV 1 RNA Quant: 67400 copies/mL — ABNORMAL HIGH
HIV-1 RNA Quant, Log: 4.83 Log copies/mL — ABNORMAL HIGH

## 2022-03-30 ENCOUNTER — Telehealth: Payer: Self-pay | Admitting: Pharmacist

## 2022-03-30 ENCOUNTER — Other Ambulatory Visit: Payer: Self-pay | Admitting: Family Medicine

## 2022-03-30 DIAGNOSIS — O09521 Supervision of elderly multigravida, first trimester: Secondary | ICD-10-CM

## 2022-03-30 NOTE — Telephone Encounter (Signed)
Cumulative HIV Genotype Data  RT Mutations  M184VI, V179E  PI Mutations  M46ML, I13V, L63P, V77I  Integrase Mutations     Interpretation of Genotype Data per [employer] HIV Drug Resistance Database:  Nucleoside RTIs  Abacavir - low level resistance Zidovudine - susceptible Emtricitabine - high level resistance Lamivudine - high level resistance Tenofovir - susceptible   Non-Nucleoside RTIs  Doravirine - susceptible Efavirenz - potential low level resistance Etravirine - potential low level resistance Nevirapine - potential low level resistance Rilpivirine - potential low level resistance   Protease Inhibitors  Atazanavir - potential low level resistance Darunavir - susceptible Lopinavir - potential low level resistance   Integrase Inhibitors  Bictegravir - susceptible Cabotegravir - susceptible Dolutegravir - susceptible Elvitegravir - susceptible Raltegravir - susceptible   Patient is taking Biktarvy + Pifeltro, which is still ok based on resistance data above.  [place] Dalporto L. Keisuke Hollabaugh, PharmD, BCIDP, AAHIVP, CPP Clinical Pharmacist Practitioner Infectious Diseases Clinical Pharmacist [facility] 03/30/2022, 9:43 AM

## 2022-03-31 ENCOUNTER — Ambulatory Visit: Payer: Commercial Managed Care - HMO | Admitting: Pharmacist

## 2022-04-01 ENCOUNTER — Ambulatory Visit: Payer: Commercial Managed Care - HMO | Admitting: Pharmacist

## 2022-04-10 ENCOUNTER — Telehealth: Payer: Self-pay

## 2022-04-10 NOTE — Telephone Encounter (Signed)
Patient with detectable viral load, called to offer appointment with pharmacy clinic. Neither number could be reached.   Sandie Ano, RN

## 2022-04-14 ENCOUNTER — Ambulatory Visit: Payer: Commercial Managed Care - HMO | Admitting: Pharmacist

## 2022-04-15 ENCOUNTER — Ambulatory Visit: Payer: Commercial Managed Care - HMO | Admitting: Pharmacist

## 2022-04-16 ENCOUNTER — Other Ambulatory Visit: Payer: Self-pay

## 2022-04-16 ENCOUNTER — Ambulatory Visit (INDEPENDENT_AMBULATORY_CARE_PROVIDER_SITE_OTHER): Payer: Commercial Managed Care - HMO | Admitting: Pharmacist

## 2022-04-16 DIAGNOSIS — B20 Human immunodeficiency virus [HIV] disease: Secondary | ICD-10-CM | POA: Diagnosis not present

## 2022-04-16 DIAGNOSIS — Z0000 Encounter for general adult medical examination without abnormal findings: Secondary | ICD-10-CM

## 2022-04-16 DIAGNOSIS — Z79899 Other long term (current) drug therapy: Secondary | ICD-10-CM

## 2022-04-16 MED ORDER — DORAVIRINE 100 MG PO TABS: 100.0000 mg | ORAL_TABLET | Freq: Every day | ORAL | 11 refills | Status: AC

## 2022-04-16 MED ORDER — BIKTARVY 50-200-25 MG PO TABS: 1.0000 | ORAL_TABLET | Freq: Every day | ORAL | 11 refills | Status: AC

## 2022-04-16 NOTE — Progress Notes (Signed)
04/16/2022  HPI: Tamara Briggs is a 36 y.o. female who presents to the [facility] pharmacy clinic for HIV follow-up.  Patient Active Problem List   Diagnosis Date Noted   Normal labor 03/25/2020   Genetic carrier status 03/15/2020   Alpha thalassemia silent carrier 03/15/2020   Advanced maternal age in multigravida, third trimester 03/01/2020   Previous cesarean section complicating pregnancy 03/01/2020   Supervision of high risk pregnancy, antepartum 10/31/2019   Routine screening for STI (sexually transmitted infection) 07/14/2018   Encounter for contraceptive management 07/14/2018   Healthcare maintenance 07/14/2018   Pregnancy affected by fetal growth restriction May 11, 2018   Neonatal death 04-06-2018   Insulin resistance 01/28/2018   HIV affecting pregnancy, antepartum 01/19/2018   History of gestational diabetes mellitus (GDM) in prior pregnancy, currently pregnant 01/19/2018   Pre-diabetes 01/13/2018   Protein-calorie malnutrition, severe (HCC) 11/24/2017   Crack cocaine use    Bipolar disorder (HCC)    History of VBAC 08/31/2016   Late prenatal care affecting pregnancy in third trimester 08/06/2016   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   BENZTROPINE (COGENTIN) 0.5 MG TABLET    Take 0.5 mg by mouth 2 (two) times daily.   BICTEGRAVIR-EMTRICITABINE-TENOFOVIR AF (BIKTARVY) 50-200-25 MG TABS TABLET    Take 1 tablet by mouth daily.   DORAVIRINE (PIFELTRO) 100 MG TABS TABLET    Take 1 tablet (100 mg total) by mouth daily.   ENSURE  (ENSURE)    Take 237 mLs by mouth 2 (two) times daily between meals. BMI 15   HEATHER 0.35 MG TABLET    Take 1 tablet (0.35 mg total) by mouth daily.   HYDROCORTISONE 2.5 % LOTION    Apply topically 2 (two) times daily.   LATUDA 40 MG TABS TABLET    TAKE one tablet BY MOUTH EVERY EVENING with meals   MEGESTROL (MEGACE) 40 MG TABLET    Take 40 mg by mouth daily.   PRENATAL 27-1 MG TABS    Take 1 tablet by mouth daily at 12 noon.   SERTRALINE (ZOLOFT) 50 MG TABLET    Take 1 tablet (50 mg total) by mouth daily.  Modified Medications   No medications on file  Discontinued Medications   No medications on file    Allergies: No Known Allergies  Past Medical History: Past Medical History:  Diagnosis Date   Anxiety    Bipolar disorder (HCC)    Crack cocaine use    Depression    h/o crack Cocaine abuse 03/15/2013   UDS positive cocaine   History of neural tube defect 11/16/2013   Pt had repair of lower lubar NTD repaired as infant.  AFP increased and sent to genetics.  Amnio declined.  [place] nml.    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   Tardive dyskinesia 06/13/2014   Tobacco abuse 03/15/2013    Social History: Social History   Socioeconomic History  Marital status: Single    Spouse name: Not on file   Number of children: 1   Years of education: 11th   Highest education level: Not on file  Occupational History   Not on file  Tobacco Use   Smoking status: Every Day    Packs/day: 0.30    Years: 3.00    Total pack years: 0.90    Types: Cigarettes   Smokeless tobacco: Never   Tobacco comments:    cutting back, 3 cigs/day  Vaping Use   Vaping Use: Never used  Substance and Sexual Activity   Alcohol use: No    Alcohol/week: 0.0 standard drinks of alcohol   Drug use: Not Currently    Frequency: 1.0 times per week    Types: Cocaine, Marijuana    Comment: cocaine 5/11,  mj 2 weeks ago   Sexual activity: Yes    Partners: Male    Birth control/protection: Injection    Comment: declined condoms  Other Topics Concern   Not on file  Social History Narrative   Patient lives at home with her partner Tamara Briggs.   Unemployed.   Education 11 th grade.   Right handed.   Patient does not drink caffeine.   Social Determinants of Health   Financial Resource Strain: Not on file  Food Insecurity: Not on file  Transportation Needs: Not on file  Physical Activity: Not on file  Stress: Not on file  Social Connections: Not on file    Labs: Lab Results  Component Value Date   HIV1RNAQUANT 67,400 (H) 03/18/2022   HIV1RNAQUANT 63 (H) 06/03/2021   HIV1RNAQUANT <20 (H) 11/19/2020   CD4TABS 451 03/18/2022   CD4TABS 730 11/19/2020   CD4TABS 428 02/21/2020    RPR and STI Lab Results  Component Value Date   LABRPR NON-REACTIVE 03/18/2022   LABRPR NON-REACTIVE 06/03/2021   LABRPR NON-REACTIVE 11/19/2020   LABRPR Non Reactive 02/29/2020   LABRPR NON-REACTIVE 02/21/2020    STI Results GC GC CT CT  Latest Ref Rng & Units  NEGATIVE  NEGATIVE  03/18/2022  4:09 PM Negative   Negative    06/03/2021  3:16 PM Negative   Negative    11/19/2020  4:29 PM Negative   Negative    08/10/2019 12:00 AM Negative   Negative    02/20/2019 12:00 AM Negative   Negative    07/14/2018 12:00 AM Negative   Negative    05/27/2018 12:00 AM Negative   Negative    04/20/2018 12:00 AM Negative   **POSITIVE**    01/05/2018 12:00 AM Negative   POSITIVE    06/30/2016 12:00 AM Negative   Negative    08/28/2014 12:00 AM NG: Negative   CT: Negative    07/20/2014  3:32 PM  NEGATIVE   POSITIVE   04/24/2014 12:00 PM  NEGATIVE   POSITIVE   12/14/2013  2:24 AM  NEGATIVE   NEGATIVE   12/14/2013 12:00 AM   Negative       09/16/2012  3:13 PM  NEGATIVE   POSITIVE   10/23/2007  2:15 PM   NEGATIVE (NOTE)  Testing performed using the BD Probetec ET Chlamydia trachomatis and Neisseria gonorrhea  amplified DNA assay.    04/04/2007  6:54 AM   NEGATIVE (NOTE)  Testing performed using the BD Probetec ET Chlamydia trachomatis and Neisseria gonorrhea amplified DNA assay.       This result is from an external source.    Hepatitis B Lab Results  Component Value  Date   HEPBSAB POS (A) 07/09/2011   HEPBSAG Negative 02/29/2020   HEPBCAB NEG 07/09/2011   Hepatitis C No results found for: "HEPCAB", "HCVRNAPCRQN" Hepatitis A Lab Results  Component Value Date   HAV NEG 07/09/2011   Lipids: Lab Results  Component Value Date   CHOL 115 11/19/2020   TRIG 72 11/19/2020   HDL 68 11/19/2020   CHOLHDL 1.7 11/19/2020   VLDL 19 10/31/2015   LDLCALC 32 11/19/2020    Current HIV Regimen: Biktarvy + Pifeltro  Assessment: Tamara Briggs is here today to follow up for her HIV infection after several missed appointments with our clinic. She was last seen by my pharmacy partner, Tamara Briggs, in October 2022. She last saw Dr. Drue Briggs in July 2022. She states that she has been off of her HIV medications since she was last in here. Refill records in the computer indicate that she last filled Biktarvy and Pifeltro in November 2022. Recent HIV RNA checked on 03/18/22 was 67,400; CD4 count was 451. She states that she is out of refills at the pharmacy and that is why she has been off of medications.  She tolerates her Biktarvy and Pifeltro well when she does take them. She is on her cycle right now and states there is no way she could be pregnant. She is not taking her birth control pill anymore because she states "it is too many pills". She would like to start her Depo shots again and will discuss this with Dr. Drue Briggs at her follow up appointment. She is currently getting her high school diploma at [employer] from [employer]. Congratulated her on this. She continues to take Tamara Briggs and Zoloft; no dose changes recently.  She has been trying to contact her case worker, Tamara Briggs, at [facility] for weeks with no success.  She calls and leaves voicemails but never receives a call back. No one was here from [facility] so I will follow up on this for her. Gave her 2 bags of food and a bag of feminine hygiene products to take with her. Also gave her several bus passes to get home. She is currently living with a friend in [place]. Will defer labs today since they were just drawn and send in refills of her HIV medications for her. She will have [facility] deliver them to her. She has been having some diarrhea and exhaustion lately that she attributes to being off of her HIV medications and the outside heat. I will have her see Dr. Drue Briggs in ~6 weeks to recheck labs and discuss getting back on Depo shots. Encouraged her to try and make the appointment with Dr. Drue Briggs or she will end up right back on my schedule. All questions and concerns addressed.  Plan: - Send in refills of Biktarvy and Pifeltro - Food from pantry - No labs today - F/u with Dr. Drue Briggs on 10/5 at 245pm  Kamela Blansett L. Braylin Formby, PharmD, BCIDP, AAHIVP, CPP Clinical Pharmacist Practitioner Infectious Diseases Clinical Pharmacist [facility] 04/16/2022, 2:46 PM

## 2022-05-28 ENCOUNTER — Ambulatory Visit: Payer: Commercial Managed Care - HMO | Admitting: Internal Medicine

## 2022-06-18 ENCOUNTER — Telehealth: Payer: Self-pay

## 2022-06-18 NOTE — Telephone Encounter (Signed)
Patient called front desk requesting a refill of hydrocortisone lotion. Will route to provider.   Beryle Flock, RN

## 2022-06-23 ENCOUNTER — Ambulatory Visit: Payer: Commercial Managed Care - HMO | Admitting: Internal Medicine

## 2022-06-30 ENCOUNTER — Ambulatory Visit: Payer: Commercial Managed Care - HMO | Admitting: Family

## 2022-07-02 ENCOUNTER — Telehealth: Payer: Self-pay

## 2022-07-02 NOTE — Telephone Encounter (Signed)
Patient with detectable viral load, called to set up appointment, no answer. Left HIPAA compliant voicemail requesting callback.   Sandie Ano, RN

## 2022-07-06 ENCOUNTER — Other Ambulatory Visit: Payer: Self-pay

## 2022-07-06 DIAGNOSIS — L989 Disorder of the skin and subcutaneous tissue, unspecified: Secondary | ICD-10-CM

## 2022-07-06 DIAGNOSIS — B20 Human immunodeficiency virus [HIV] disease: Secondary | ICD-10-CM

## 2022-07-06 MED ORDER — HYDROCORTISONE 2.5 % EX LOTN: TOPICAL_LOTION | Freq: Two times a day (BID) | CUTANEOUS | 3 refills | Status: AC

## 2024-01-21 ENCOUNTER — Emergency Department (HOSPITAL_COMMUNITY)

## 2024-01-21 ENCOUNTER — Emergency Department (HOSPITAL_COMMUNITY)
Admission: EM | Admit: 2024-01-21 | Discharge: 2024-01-21 | Disposition: A | Discharge status: Home / Self Care | Attending: Emergency Medicine | Admitting: Emergency Medicine

## 2024-01-21 ENCOUNTER — Encounter (HOSPITAL_COMMUNITY): Payer: Self-pay

## 2024-01-21 ENCOUNTER — Other Ambulatory Visit: Payer: Self-pay

## 2024-01-21 DIAGNOSIS — G219 Secondary parkinsonism, unspecified: Secondary | ICD-10-CM | POA: Insufficient documentation

## 2024-01-21 DIAGNOSIS — X501XXA Overexertion from prolonged static or awkward postures, initial encounter: Secondary | ICD-10-CM | POA: Insufficient documentation

## 2024-01-21 DIAGNOSIS — Z21 Asymptomatic human immunodeficiency virus [HIV] infection status: Secondary | ICD-10-CM | POA: Insufficient documentation

## 2024-01-21 DIAGNOSIS — D61818 Other pancytopenia: Secondary | ICD-10-CM | POA: Insufficient documentation

## 2024-01-21 DIAGNOSIS — J069 Acute upper respiratory infection, unspecified: Secondary | ICD-10-CM | POA: Insufficient documentation

## 2024-01-21 DIAGNOSIS — S29011A Strain of muscle and tendon of front wall of thorax, initial encounter: Secondary | ICD-10-CM | POA: Diagnosis not present

## 2024-01-21 DIAGNOSIS — R059 Cough, unspecified: Secondary | ICD-10-CM | POA: Diagnosis present

## 2024-01-21 LAB — CBC WITH DIFFERENTIAL/PLATELET
Abs Immature Granulocytes: 0.01 10*3/uL (ref 0.00–0.07)
Basophils Absolute: 0 10*3/uL (ref 0.0–0.1)
Basophils Relative: 0 %
Eosinophils Absolute: 0 10*3/uL (ref 0.0–0.5)
Eosinophils Relative: 1 %
HCT: 33 % — ABNORMAL LOW (ref 36.0–46.0)
Hemoglobin: 11.6 g/dL — ABNORMAL LOW (ref 12.0–15.0)
Immature Granulocytes: 0 %
Lymphocytes Relative: 27 %
Lymphs Abs: 1 10*3/uL (ref 0.7–4.0)
MCH: 31 pg (ref 26.0–34.0)
MCHC: 35.2 g/dL (ref 30.0–36.0)
MCV: 88.2 fL (ref 80.0–100.0)
Monocytes Absolute: 0.5 10*3/uL (ref 0.1–1.0)
Monocytes Relative: 14 %
Neutro Abs: 2.1 10*3/uL (ref 1.7–7.7)
Neutrophils Relative %: 58 %
Platelets: 118 10*3/uL — ABNORMAL LOW (ref 150–400)
RBC: 3.74 MIL/uL — ABNORMAL LOW (ref 3.87–5.11)
RDW: 13.6 % (ref 11.5–15.5)
Smear Review: DECREASED
WBC: 3.6 10*3/uL — ABNORMAL LOW (ref 4.0–10.5)
nRBC: 0 % (ref 0.0–0.2)

## 2024-01-21 LAB — COMPREHENSIVE METABOLIC PANEL WITH GFR
ALT: 13 U/L (ref 0–44)
AST: 26 U/L (ref 15–41)
Albumin: 3.2 g/dL — ABNORMAL LOW (ref 3.5–5.0)
Alkaline Phosphatase: 41 U/L (ref 38–126)
Anion gap: 12 (ref 5–15)
BUN: 14 mg/dL (ref 6–20)
CO2: 23 mmol/L (ref 22–32)
Calcium: 8.1 mg/dL — ABNORMAL LOW (ref 8.9–10.3)
Chloride: 98 mmol/L (ref 98–111)
Creatinine, Ser: 0.89 mg/dL (ref 0.44–1.00)
GFR, Estimated: >60 mL/min (ref >60)
Glucose, Bld: 88 mg/dL (ref 70–99)
Potassium: 3.7 mmol/L (ref 3.5–5.1)
Sodium: 133 mmol/L — ABNORMAL LOW (ref 135–145)
Total Bilirubin: 0.4 mg/dL (ref 0.0–1.2)
Total Protein: 8.2 g/dL — ABNORMAL HIGH (ref 6.5–8.1)

## 2024-01-21 LAB — URINALYSIS, ROUTINE W REFLEX MICROSCOPIC
Bacteria, UA: NONE SEEN
Bilirubin Urine: NEGATIVE
Glucose, UA: NEGATIVE mg/dL
Hgb urine dipstick: NEGATIVE
Ketones, ur: NEGATIVE mg/dL
Leukocytes,Ua: NEGATIVE
Nitrite: NEGATIVE
Protein, ur: 30 mg/dL — AB
Specific Gravity, Urine: 1.025 (ref 1.005–1.030)
pH: 5 (ref 5.0–8.0)

## 2024-01-21 LAB — RESP PANEL BY RT-PCR (RSV, FLU A&B, COVID)  RVPGX2
Influenza A by PCR: NEGATIVE
Influenza B by PCR: NEGATIVE
Resp Syncytial Virus by PCR: NEGATIVE
SARS Coronavirus 2 by RT PCR: NEGATIVE

## 2024-01-21 LAB — HCG, SERUM, QUALITATIVE: Preg, Serum: NEGATIVE

## 2024-01-21 LAB — TROPONIN I (HIGH SENSITIVITY): Troponin I (High Sensitivity): 4 ng/L (ref <18)

## 2024-01-21 MED ORDER — SODIUM CHLORIDE 0.9 % IV BOLUS
1000.0000 mL | Freq: Once | INTRAVENOUS | Status: AC
  Administered 2024-01-21: 1000 mL via INTRAVENOUS

## 2024-01-21 MED ORDER — BENZONATATE 100 MG PO CAPS
200.0000 mg | ORAL_CAPSULE | Freq: Once | ORAL | Status: AC
  Administered 2024-01-21: 200 mg via ORAL
  Filled 2024-01-21 (×2): qty 2

## 2024-01-21 MED ORDER — ACETAMINOPHEN 500 MG PO TABS
1000.0000 mg | ORAL_TABLET | Freq: Four times a day (QID) | ORAL | Status: DC | PRN
  Administered 2024-01-21: 1000 mg via ORAL
  Filled 2024-01-21 (×2): qty 2

## 2024-01-21 MED ORDER — BENZONATATE 100 MG PO CAPS: 100.0000 mg | ORAL_CAPSULE | Freq: Three times a day (TID) | ORAL | 0 refills | Status: AC

## 2024-01-21 NOTE — ED Triage Notes (Signed)
 PT BIB EMS for a cough for 3 days, and some nausea, family has also been sick with similar symptoms.   103/60 HR 105 Resp 20 98%RA CBG 123 99.1

## 2024-01-21 NOTE — Discharge Instructions (Addendum)
 As we discussed, you can continue to use Tylenol  for fever and for pain, and a cough suppressant sent to your pharmacy.  You can use this as needed for cough over the next several days.  Also highly encourage you to increase your fluid intake to avoid dehydration.  If symptoms worsen, you feel lightheaded or dizzy, any other concerns please revisit this emergency department for further evaluation and management.  Otherwise please follow-up with your primary care within the next week.

## 2024-01-21 NOTE — ED Provider Notes (Signed)
 [place] EMERGENCY DEPARTMENT AT [facility] Provider Note   CSN: [id number] Arrival date & time: 01/21/24  1417     History  Chief Complaint  Patient presents with   Cough    Tamara Briggs is a 38 y.o. female who presents to this ED brought in by EMS for cough over the last 3 days.  She complains of chest pain that is worsened with cough.  Further also has some nausea.  Noted from EMS handoff that this patient was mildly tachycardic.  No reports of fever, however patient on exam only states that she has persistent cough as well as chest discomfort.  Denies any sore throat.    Notable previous diagnoses are HIV, bipolar disorder, schizophrenia, tardive dyskinesia, secondary Parkinson disease.   Cough Associated symptoms: chest pain        Home Medications Prior to Admission medications   Medication Sig Start Date End Date Taking? Authorizing Provider  benzonatate  (TESSALON ) 100 MG capsule Take 1 capsule (100 mg total) by mouth every 8 (eight) hours. 01/21/24  Yes Juanetta Nordmann, PA  acetaminophen  (TYLENOL ) 325 MG tablet Take 2 tablets (650 mg total) by mouth every 4 (four) hours as needed. 03/28/20   Sparacino, Hailey L, DO  benztropine  (COGENTIN ) 0.5 MG tablet Take 0.5 mg by mouth 2 (two) times daily. 11/11/20   [provider]  bictegravir-emtricitabine -tenofovir  AF (BIKTARVY ) 50-200-25 MG TABS tablet Take 1 tablet by mouth daily. 04/16/22   Kuppelweiser, Cassie L, RPH-CPP  doravirine  (PIFELTRO ) 100 MG TABS tablet Take 1 tablet (100 mg total) by mouth daily. 04/16/22   Kuppelweiser, Cassie L, RPH-CPP  Ensure (ENSURE) Take 237 mLs by mouth 2 (two) times daily between meals. BMI 15 01/21/21   Liane Redman, MD  HEATHER  0.35 MG tablet Take 1 tablet (0.35 mg total) by mouth daily. 12/11/21   Stinson, Jacob J, DO  hydrocortisone  2.5 % lotion Apply topically 2 (two) times daily. 07/06/22   Liane Redman, MD  LATUDA  40 MG TABS tablet TAKE one tablet BY MOUTH EVERY  EVENING with meals 03/28/20   Sparacino, Hailey L, DO  megestrol (MEGACE) 40 MG tablet Take 40 mg by mouth daily. 11/06/20   [provider]  Prenatal 27-1 MG TABS Take 1 tablet by mouth daily at 12 noon. 03/25/21   Granville Layer, MD  sertraline  (ZOLOFT ) 50 MG tablet Take 1 tablet (50 mg total) by mouth daily. 08/06/14   Liane Redman, MD      Allergies    Patient has no known allergies.    Review of Systems   Review of Systems  Constitutional:  Positive for activity change.       General malaise  Respiratory:  Positive for cough.   Cardiovascular:  Positive for chest pain.  All other systems reviewed and are negative.   Physical Exam Updated Vital Signs BP (!) 142/114   Pulse 79   Temp 100.3 F (37.9 C) (Oral)   Resp 20   SpO2 96%  Physical Exam Vitals and nursing note reviewed.  Constitutional:      General: She is not in acute distress.    Appearance: Normal appearance. She is ill-appearing.  HENT:     Head: Normocephalic and atraumatic.     Mouth/Throat:     Mouth: Mucous membranes are moist.     Pharynx: Oropharynx is clear. Uvula midline. No pharyngeal swelling, oropharyngeal exudate or posterior oropharyngeal erythema.     Tonsils: No tonsillar exudate or tonsillar abscesses.  Eyes:     Extraocular Movements: Extraocular movements intact.     Conjunctiva/sclera: Conjunctivae normal.     Pupils: Pupils are equal, round, and reactive to light.  Cardiovascular:     Rate and Rhythm: Regular rhythm. Tachycardia present.     Pulses: Normal pulses.     Heart sounds: Normal heart sounds. No murmur heard.    No friction rub. No gallop.  Pulmonary:     Effort: Pulmonary effort is normal. No respiratory distress.     Breath sounds: Normal breath sounds. No stridor. No decreased breath sounds, wheezing, rhonchi or rales.  Abdominal:     General: Abdomen is flat. Bowel sounds are normal.     Palpations: Abdomen is soft.  Musculoskeletal:        General: Normal  range of motion.     Cervical back: Normal range of motion and neck supple.  Skin:    General: Skin is warm and dry.     Capillary Refill: Capillary refill takes less than 2 seconds.  Neurological:     General: No focal deficit present.     Mental Status: She is alert. Mental status is at baseline.  Psychiatric:        Mood and Affect: Mood normal.     ED Results / Procedures / Treatments   Labs (all labs ordered are listed, but only abnormal results are displayed) Labs Reviewed  COMPREHENSIVE METABOLIC PANEL WITH GFR - Abnormal; Notable for the following components:      Result Value   Sodium 133 (*)    Calcium  8.1 (*)    Total Protein 8.2 (*)    Albumin 3.2 (*)    All other components within normal limits  CBC WITH DIFFERENTIAL/PLATELET - Abnormal; Notable for the following components:   WBC 3.6 (*)    RBC 3.74 (*)    Hemoglobin 11.6 (*)    HCT 33.0 (*)    Platelets 118 (*)    All other components within normal limits  URINALYSIS, ROUTINE W REFLEX MICROSCOPIC - Abnormal; Notable for the following components:   Protein, ur 30 (*)    All other components within normal limits  RESP PANEL BY RT-PCR (RSV, FLU A&B, COVID)  RVPGX2  HCG, SERUM, QUALITATIVE  T-HELPER CELLS (CD4) COUNT (NOT AT [facility])  TROPONIN I (HIGH SENSITIVITY)    EKG None  Radiology DG Chest 2 View Result Date: 01/21/2024 CLINICAL DATA:  Chest pain EXAM: CHEST - 2 VIEW COMPARISON:  Chest x-ray 10/22/2022.  Image only.  No report. FINDINGS: No consolidation, pneumothorax or effusion. No edema. Normal cardiopericardial silhouette. Artifact from the patient's clothing. IMPRESSION: No acute cardiopulmonary disease. Electronically Signed   By: Adrianna Horde M.D.   On: 01/21/2024 17:41    Procedures Procedures    Medications Ordered in ED Medications  acetaminophen  (TYLENOL ) tablet 1,000 mg (1,000 mg Oral Not Given 01/21/24 1812)  benzonatate  (TESSALON ) capsule 200 mg (200 mg Oral Not Given 01/21/24 1812)   sodium chloride  0.9 % bolus 1,000 mL (0 mLs Intravenous Stopped 01/21/24 1733)    ED Course/ Medical Decision Making/ A&P Clinical Course as of 01/21/24 1828  Fri Jan 21, 2024  1748 DG Chest 2 View Reviewed, shows no acute cardiopulmonary disease. [JG]  1827 Hemoglobin(!): 11.6 Similar to prior  [SG]    Clinical Course User Index [JG] Juanetta Nordmann, PA [SG] Teddi Favors, DO  Medical Decision Making Amount and/or Complexity of Data Reviewed Labs: ordered. Radiology: ordered. Decision-making details documented in ED Course.  Risk OTC drugs. Prescription drug management.   Medical Decision Making:   Mersadez Linden is a 38 y.o. female who presented to the ED today with chest discomfort and cough detailed above.     Complete initial physical exam performed, notably the patient  was alert and oriented and in no apparent distress however is ill-appearing.  Lung sounds are clear and equal in all fields without any adventitious sounds noted good air entry.  Posterior oropharynx is normal-appearing without any edema, erythema, hearing tonsillar exudates.     Reviewed and confirmed nursing documentation for past medical history, family history, social history.    Initial Assessment:   With the patient's presentation of chest discomfort and cough, most likely diagnosis is viral URI. Other diagnoses were considered including (but not limited to) ACS, pneumonia. These are considered less likely due to history of present illness and physical exam findings.     Initial Plan:  COVID/flu/RSV swab to rule out viral etiologies. Screening labs including CBC and Metabolic panel to evaluate for infectious or metabolic etiology of disease.  Urinalysis with reflex culture ordered to evaluate for UTI or relevant urologic/nephrologic pathology.  CXR to evaluate for structural/infectious intrathoracic pathology.  EKG and single troponin to evaluate for cardiac  pathology. Single troponin appropriate due to greater than 6 hours since maximal intensity of symptoms. Initiate IV and provide liter isotonic solution bolus due to tachycardia. Objective evaluation as below reviewed   Initial Study Results:   Laboratory  All laboratory results reviewed without evidence of clinically relevant pathology.   Exceptions include: Mild pancytopenia noted.  White count 3.6, hemoglobin 11.6, platelets 118.  EKG EKG was reviewed independently. Rate, rhythm, axis, intervals all examined and without medically relevant abnormality. ST segments without concerns for elevations.    Radiology:  All images reviewed independently. Agree with radiology report at this time.   DG Chest 2 View Result Date: 01/21/2024 CLINICAL DATA:  Chest pain EXAM: CHEST - 2 VIEW COMPARISON:  Chest x-ray 10/22/2022.  Image only.  No report. FINDINGS: No consolidation, pneumothorax or effusion. No edema. Normal cardiopericardial silhouette. Artifact from the patient's clothing. IMPRESSION: No acute cardiopulmonary disease. Electronically Signed   By: Adrianna Horde M.D.   On: 01/21/2024 17:41    Reassessment and Plan:   Cardiac workup in this patient does not show any concern for acute coronary syndrome, troponin is negative as is EKG.  There is also no acute cardiopulmonary disease as chest x-ray is negative for any acute findings, lab workup does not show any concern for COVID/flu/RSV.  CMP is largely unremarkable.  There is a moderate pancytopenia however, this will need to be addressed by primary care and measured for progression.   Given clinical exam and clinical picture, believe chest discomfort is largely due to forceful coughing that has caused a strain of the chest wall and the anterior thorax.  As such we will manage cough with Tessalon , manage her fever with Tylenol , she was provided with 1 L of saline due to tachycardia and repeat vital signs prior to discharge showed a heart rate of 82.   Discussed following up with primary care for continued monitoring, patient and family member at bedside understand and agree and have no further concerns at this time.        Final Clinical Impression(s) / ED Diagnoses Final diagnoses:  Viral URI with cough  Acute upper respiratory infection    Rx / DC Orders ED Discharge Orders          Ordered    benzonatate  (TESSALON ) 100 MG capsule  Every 8 hours        01/21/24 1822              Juanetta Nordmann, [place] 01/21/24 1843    Teddi Favors, DO 01/25/24 1600

## 2024-01-24 LAB — T-HELPER CELLS (CD4) COUNT (NOT AT ARMC)

## 2024-04-13 ENCOUNTER — Encounter (HOSPITAL_COMMUNITY): Payer: Self-pay | Admitting: Emergency Medicine

## 2024-04-13 ENCOUNTER — Emergency Department (HOSPITAL_COMMUNITY)
Admission: EM | Admit: 2024-04-13 | Discharge: 2024-04-13 | Disposition: A | Discharge status: Home / Self Care | Payer: MEDICAID | Attending: Emergency Medicine | Admitting: Emergency Medicine

## 2024-04-13 ENCOUNTER — Other Ambulatory Visit: Payer: Self-pay

## 2024-04-13 DIAGNOSIS — Z21 Asymptomatic human immunodeficiency virus [HIV] infection status: Secondary | ICD-10-CM | POA: Diagnosis not present

## 2024-04-13 DIAGNOSIS — L0231 Cutaneous abscess of buttock: Secondary | ICD-10-CM | POA: Diagnosis present

## 2024-04-13 MED ORDER — IBUPROFEN 400 MG PO TABS
600.0000 mg | ORAL_TABLET | Freq: Once | ORAL | Status: AC
  Administered 2024-04-13: 600 mg via ORAL
  Filled 2024-04-13: qty 1

## 2024-04-13 MED ORDER — DOXYCYCLINE HYCLATE 100 MG PO TABS
100.0000 mg | ORAL_TABLET | Freq: Once | ORAL | Status: AC
  Administered 2024-04-13: 100 mg via ORAL
  Filled 2024-04-13: qty 1

## 2024-04-13 MED ORDER — DOXYCYCLINE HYCLATE 100 MG PO CAPS: 100.0000 mg | ORAL_CAPSULE | Freq: Two times a day (BID) | ORAL | 0 refills | Status: AC

## 2024-04-13 NOTE — ED Notes (Signed)
 Patient given lunch baggy upon discharge,

## 2024-04-13 NOTE — ED Provider Notes (Signed)
 [place] EMERGENCY DEPARTMENT AT [facility] Provider Note   CSN: [id number] Arrival date & time: 04/13/24  1436     Patient presents with: Abscess   Tamara Briggs is a 38 y.o. female with a past medical history of HIV, schizophrenia, polysubstance abuse presents to Emergency Department for evaluation of abscess on left buttocks for the past 3 days.  Reports that has been actively draining.  Denies fevers, chills    Abscess      Prior to Admission medications   Medication Sig Start Date End Date Taking? Authorizing Provider  acetaminophen  (TYLENOL ) 325 MG tablet Take 2 tablets (650 mg total) by mouth every 4 (four) hours as needed. 03/28/20   Sparacino, Hailey L, DO  benzonatate  (TESSALON ) 100 MG capsule Take 1 capsule (100 mg total) by mouth every 8 (eight) hours. 01/21/24   Myriam Dorn BROCKS, PA  benztropine  (COGENTIN ) 0.5 MG tablet Take 0.5 mg by mouth 2 (two) times daily. 11/11/20   [provider]  bictegravir-emtricitabine -tenofovir  AF (BIKTARVY ) 50-200-25 MG TABS tablet Take 1 tablet by mouth daily. 04/16/22   Kuppelweiser, Cassie L, RPH-CPP  doravirine  (PIFELTRO ) 100 MG TABS tablet Take 1 tablet (100 mg total) by mouth daily. 04/16/22   Kuppelweiser, Cassie L, RPH-CPP  Ensure (ENSURE) Take 237 mLs by mouth 2 (two) times daily between meals. BMI 15 01/21/21   Luiz Channel, MD  HEATHER  0.35 MG tablet Take 1 tablet (0.35 mg total) by mouth daily. 12/11/21   Stinson, Jacob J, DO  hydrocortisone  2.5 % lotion Apply topically 2 (two) times daily. 07/06/22   Luiz Channel, MD  LATUDA  40 MG TABS tablet TAKE one tablet BY MOUTH EVERY EVENING with meals 03/28/20   Sparacino, Hailey L, DO  megestrol (MEGACE) 40 MG tablet Take 40 mg by mouth daily. 11/06/20   [provider]  Prenatal 27-1 MG TABS Take 1 tablet by mouth daily at 12 noon. 03/25/21   Fredirick Glenys RAMAN, MD  sertraline  (ZOLOFT ) 50 MG tablet Take 1 tablet (50 mg total) by mouth daily. 08/06/14   Luiz Channel, MD    Allergies: Patient has no known allergies.    Review of Systems  Skin:  Positive for wound.    Updated Vital Signs BP 100/66 (BP Location: Left Arm)   Pulse 98   Temp 98.5 F (36.9 C) (Oral)   Resp 18   Ht 5' 4 (1.626 m)   Wt 40.4 kg   SpO2 100%   BMI 15.28 kg/m   Physical Exam Vitals and nursing note reviewed. Exam conducted with a chaperone present.  Constitutional:      General: She is not in acute distress.    Appearance: Normal appearance.  HENT:     Head: Normocephalic and atraumatic.  Eyes:     Conjunctiva/sclera: Conjunctivae normal.  Cardiovascular:     Rate and Rhythm: Normal rate.  Pulmonary:     Effort: Pulmonary effort is normal. No respiratory distress.  Genitourinary:    Comments: 1cm round actively draining fluctuant abscess of left buttocks. Nor surrounding erythema, warmth, nor streaking Skin:    Coloration: Skin is not jaundiced or pale.  Neurological:     Mental Status: She is alert. Mental status is at baseline.     (all labs ordered are listed, but only abnormal results are displayed) Labs Reviewed - No data to display  EKG: None  Radiology: No results found.   SABRAUltrasound ED Soft Tissue  Date/Time: 04/13/2024 9:29 PM  Performed by:  Minnie Tinnie BRAVO, PA Authorized by: Minnie Tinnie BRAVO, PA   Procedure details:    Indications: localization of abscess and evaluate for cellulitis     Transverse view:  Visualized   Limitations:  Patient compliance Location:    Location: buttocks     Side:  Left Comments:     Small 1x1cm abscess noted on US . Wound is actively draining    Medications Ordered in the ED  doxycycline  (VIBRA -TABS) tablet 100 mg (100 mg Oral Given 04/13/24 2048)  ibuprofen  (ADVIL ) tablet 600 mg (600 mg Oral Given 04/13/24 2048)                                    Medical Decision Making Risk Prescription drug management.   Patient presents to the ED for concern of wound, this involves an extensive  number of treatment options, and is a complaint that carries with it a high risk of complications and morbidity.  The differential diagnosis includes abscess, cellulitis, deep space infection, fistula, fissure   Co morbidities that complicate the patient evaluation  HIV See HPI   Additional history obtained:  Additional history obtained from Nursing and Outside Medical Records   External records from outside source obtained and reviewed including triage note    Medicines ordered and prescription drug management:  I ordered medication including doxycycline , ibuprofen  for abscess Reevaluation of the patient after these medicines showed that the patient stayed the same I have reviewed the patients home medicines and have made adjustments as needed     Problem List / ED Course:  Abscess Ultrasound performed with mild difficulty secondary to patient compliance, comfortability, pain. Discussed importance of further draining already draining wound as there was a small 1 cm abscess noted on ultrasound however patient adamantly refuses.  I discussed that antibiotics may not be able to clean her entire wound and she expresses understanding but still does not wish to proceed with I&D.  I discussed importance of antibacterial washes, continuing to drain abscess at home, antibiotic compliance Did provide 1 dose of doxycycline  here in emergency department.  Ibuprofen  for pain Provided doxy prescription for MRSA coverage Had extensive conversation about patient picking up her antibiotics.  She does not have a car and I am worried about her picking them up.  She discussed that she is able to get around with public transportation, friends and will pick up her antibiotic. Will have her follow-up in 2-3 days for wound recheck. Discussed this with patient extensively and she agrees with plan   Reevaluation:  After the interventions noted above, I reevaluated the patient and found that they have  :stayed the same   Social Determinants of Health:  Polysubstance use   Dispostion:  After consideration of the diagnostic results and the patients response to treatment, I feel that the patent would benefit from outpatient management with symptomatic care.   Discussed ED workup, disposition, return to ED precautions with patient who expresses understanding agrees with plan.  All questions answered to their satisfaction.  They are agreeable to plan.  Discharge instructions provided on paperwork  Final diagnoses:  Abscess of buttock, left    ED Discharge Orders     None        Minnie Tinnie BRAVO, PA 04/13/24 2138    Yolande Lamar BROCKS, MD 04/13/24 [phone]

## 2024-04-13 NOTE — Discharge Instructions (Addendum)
 Thank for letting us  evaluate you today.  As discussed, please soak bottom in antibacterial soaks, continue draining abscess, take antibiotics as prescribed.  Do not miss a dose.  I have sent prescription to [facility] pharmacy -please pick this up tomorrow and start taking tomorrow morning  Please return to emergency department in 2-3 days for wound recheck

## 2024-04-13 NOTE — ED Triage Notes (Signed)
 Pt to ER via EMS form home with c/o abscess to left buttock.  States first noted 2-3 days ago, now draining bloody, purulent drainage.  States she thinks it may be a spider bite.

## 2024-04-28 ENCOUNTER — Other Ambulatory Visit: Admitting: Psychology
